# Patient Record
Sex: Male | Born: 1950
Health system: Southern US, Community
[De-identification: ages and names within clinical notes are randomized; demographics above are authoritative.]

## PROBLEM LIST (undated history)

## (undated) DIAGNOSIS — Z972 Presence of dental prosthetic device (complete) (partial): Secondary | ICD-10-CM

## (undated) DIAGNOSIS — M2140 Flat foot [pes planus] (acquired), unspecified foot: Secondary | ICD-10-CM

## (undated) DIAGNOSIS — M810 Age-related osteoporosis without current pathological fracture: Secondary | ICD-10-CM

## (undated) DIAGNOSIS — M1990 Unspecified osteoarthritis, unspecified site: Secondary | ICD-10-CM

## (undated) DIAGNOSIS — F1090 Alcohol use, unspecified, uncomplicated: Secondary | ICD-10-CM

## (undated) DIAGNOSIS — K1370 Unspecified lesions of oral mucosa: Secondary | ICD-10-CM

## (undated) DIAGNOSIS — F319 Bipolar disorder, unspecified: Secondary | ICD-10-CM

## (undated) DIAGNOSIS — Z7289 Other problems related to lifestyle: Secondary | ICD-10-CM

## (undated) DIAGNOSIS — N400 Enlarged prostate without lower urinary tract symptoms: Secondary | ICD-10-CM

## (undated) DIAGNOSIS — F259 Schizoaffective disorder, unspecified: Secondary | ICD-10-CM

## (undated) DIAGNOSIS — R7303 Prediabetes: Secondary | ICD-10-CM

## (undated) HISTORY — DX: Prediabetes: R73.03

## (undated) HISTORY — DX: Age-related osteoporosis without current pathological fracture: M81.0

## (undated) HISTORY — DX: Bipolar disorder, unspecified: F31.9

## (undated) HISTORY — DX: Unspecified osteoarthritis, unspecified site: M19.90

## (undated) HISTORY — DX: Benign prostatic hyperplasia without lower urinary tract symptoms: N40.0

## (undated) HISTORY — DX: Alcohol use, unspecified, uncomplicated: F10.90

## (undated) HISTORY — DX: Flat foot (pes planus) (acquired), unspecified foot: M21.40

## (undated) HISTORY — DX: Other problems related to lifestyle: Z72.89

## (undated) HISTORY — DX: Schizoaffective disorder, unspecified: F25.9

---
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
## 2002-07-25 ENCOUNTER — Encounter: Payer: Self-pay | Admitting: Family Medicine

## 2002-07-25 ENCOUNTER — Encounter: Admission: RE | Admit: 2002-07-25 | Discharge: 2002-07-25 | Payer: Self-pay | Admitting: Family Medicine

## 2002-08-26 ENCOUNTER — Encounter: Payer: Self-pay | Admitting: Family Medicine

## 2002-08-26 ENCOUNTER — Encounter: Admission: RE | Admit: 2002-08-26 | Discharge: 2002-08-26 | Payer: Self-pay | Admitting: Family Medicine

## 2004-08-03 ENCOUNTER — Ambulatory Visit: Payer: Self-pay | Admitting: Family Medicine

## 2005-03-03 ENCOUNTER — Ambulatory Visit: Payer: Self-pay | Admitting: Family Medicine

## 2005-03-18 ENCOUNTER — Ambulatory Visit: Payer: Self-pay | Admitting: Family Medicine

## 2005-04-28 ENCOUNTER — Ambulatory Visit: Payer: Self-pay | Admitting: Family Medicine

## 2005-10-04 ENCOUNTER — Ambulatory Visit: Payer: Self-pay | Admitting: Family Medicine

## 2005-11-04 ENCOUNTER — Ambulatory Visit: Payer: Self-pay | Admitting: Family Medicine

## 2006-10-27 ENCOUNTER — Ambulatory Visit: Payer: Self-pay | Admitting: Family Medicine

## 2006-10-27 LAB — CONVERTED CEMR LAB
PSA: 0.37 ng/mL
PSA: 0.37 ng/mL (ref 0.10–4.00)
Valproic Acid Lvl: 52 ug/mL (ref 50.0–100.0)

## 2006-11-04 ENCOUNTER — Emergency Department (HOSPITAL_COMMUNITY): Admission: EM | Admit: 2006-11-04 | Discharge: 2006-11-04 | Payer: Self-pay | Admitting: Family Medicine

## 2006-11-09 ENCOUNTER — Ambulatory Visit: Payer: Self-pay | Admitting: Family Medicine

## 2006-11-17 ENCOUNTER — Ambulatory Visit: Payer: Self-pay | Admitting: Family Medicine

## 2007-07-18 ENCOUNTER — Ambulatory Visit: Payer: Self-pay | Admitting: Family Medicine

## 2007-07-18 DIAGNOSIS — M79662 Pain in left lower leg: Secondary | ICD-10-CM

## 2007-07-18 DIAGNOSIS — R319 Hematuria, unspecified: Secondary | ICD-10-CM

## 2007-07-18 DIAGNOSIS — M7989 Other specified soft tissue disorders: Secondary | ICD-10-CM

## 2007-07-18 DIAGNOSIS — N400 Enlarged prostate without lower urinary tract symptoms: Secondary | ICD-10-CM | POA: Insufficient documentation

## 2007-07-18 DIAGNOSIS — F200 Paranoid schizophrenia: Secondary | ICD-10-CM

## 2007-07-18 HISTORY — DX: Benign prostatic hyperplasia without lower urinary tract symptoms: N40.0

## 2007-07-19 ENCOUNTER — Encounter: Admission: RE | Admit: 2007-07-19 | Discharge: 2007-07-19 | Payer: Self-pay | Admitting: Family Medicine

## 2007-08-17 ENCOUNTER — Encounter: Payer: Self-pay | Admitting: Family Medicine

## 2007-08-17 ENCOUNTER — Ambulatory Visit: Payer: Self-pay | Admitting: Internal Medicine

## 2007-08-27 ENCOUNTER — Encounter (INDEPENDENT_AMBULATORY_CARE_PROVIDER_SITE_OTHER): Payer: Self-pay | Admitting: *Deleted

## 2007-09-04 ENCOUNTER — Ambulatory Visit: Payer: Self-pay | Admitting: Family Medicine

## 2007-09-04 DIAGNOSIS — M810 Age-related osteoporosis without current pathological fracture: Secondary | ICD-10-CM

## 2007-09-04 DIAGNOSIS — Z87891 Personal history of nicotine dependence: Secondary | ICD-10-CM

## 2007-09-04 HISTORY — DX: Age-related osteoporosis without current pathological fracture: M81.0

## 2007-12-14 ENCOUNTER — Encounter (INDEPENDENT_AMBULATORY_CARE_PROVIDER_SITE_OTHER): Payer: Self-pay | Admitting: *Deleted

## 2007-12-17 ENCOUNTER — Ambulatory Visit: Payer: Self-pay | Admitting: Family Medicine

## 2007-12-17 DIAGNOSIS — J069 Acute upper respiratory infection, unspecified: Secondary | ICD-10-CM | POA: Insufficient documentation

## 2007-12-18 ENCOUNTER — Telehealth: Payer: Self-pay | Admitting: Family Medicine

## 2008-01-09 ENCOUNTER — Telehealth: Payer: Self-pay | Admitting: Family Medicine

## 2008-01-11 ENCOUNTER — Ambulatory Visit: Payer: Self-pay | Admitting: Family Medicine

## 2008-01-11 DIAGNOSIS — M2140 Flat foot [pes planus] (acquired), unspecified foot: Secondary | ICD-10-CM

## 2008-01-18 ENCOUNTER — Encounter: Payer: Self-pay | Admitting: Family Medicine

## 2008-07-09 ENCOUNTER — Ambulatory Visit: Payer: Self-pay | Admitting: Family Medicine

## 2008-07-09 DIAGNOSIS — T887XXA Unspecified adverse effect of drug or medicament, initial encounter: Secondary | ICD-10-CM | POA: Insufficient documentation

## 2008-07-10 ENCOUNTER — Telehealth: Payer: Self-pay | Admitting: Family Medicine

## 2008-07-11 LAB — CONVERTED CEMR LAB
ALT: 18 units/L (ref 0–53)
AST: 13 units/L (ref 0–37)
Albumin: 3.8 g/dL (ref 3.5–5.2)
Alkaline Phosphatase: 46 units/L (ref 39–117)
BUN: 7 mg/dL (ref 6–23)
Bilirubin, Direct: 0.1 mg/dL (ref 0.0–0.3)
CO2: 29 meq/L (ref 19–32)
Calcium: 9.5 mg/dL (ref 8.4–10.5)
Chloride: 100 meq/L (ref 96–112)
Creatinine, Ser: 0.8 mg/dL (ref 0.4–1.5)
GFR calc Af Amer: 129 mL/min
GFR calc non Af Amer: 106 mL/min
Glucose, Bld: 90 mg/dL (ref 70–99)
PSA: 0.41 ng/mL (ref 0.10–4.00)
Phosphorus: 3.5 mg/dL (ref 2.3–4.6)
Potassium: 4.3 meq/L (ref 3.5–5.1)
Sodium: 136 meq/L (ref 135–145)
TSH: 2.09 microintl units/mL (ref 0.35–5.50)
Total Bilirubin: 0.9 mg/dL (ref 0.3–1.2)
Total Protein: 6.7 g/dL (ref 6.0–8.3)
Vit D, 1,25-Dihydroxy: 49 (ref 30–89)

## 2008-11-13 ENCOUNTER — Ambulatory Visit: Payer: Self-pay | Admitting: Family Medicine

## 2008-11-13 DIAGNOSIS — R0609 Other forms of dyspnea: Secondary | ICD-10-CM

## 2008-11-13 DIAGNOSIS — R0989 Other specified symptoms and signs involving the circulatory and respiratory systems: Secondary | ICD-10-CM

## 2008-11-25 ENCOUNTER — Ambulatory Visit: Payer: Self-pay

## 2008-11-25 ENCOUNTER — Encounter: Payer: Self-pay | Admitting: Family Medicine

## 2012-09-26 DIAGNOSIS — R7303 Prediabetes: Secondary | ICD-10-CM

## 2012-09-26 HISTORY — DX: Prediabetes: R73.03

## 2012-11-20 LAB — CBC
HGB: 15.9 g/dL
WBC: 6.7
platelet count: 205

## 2012-11-20 LAB — HEMOGLOBIN A1C: A1c: 5.9

## 2012-11-20 LAB — VALPROIC ACID LEVEL: Valproic Acid Lvl: 54.5

## 2012-11-20 LAB — COMPREHENSIVE METABOLIC PANEL
ALT: 17 U/L (ref 10–40)
Glucose: 89

## 2012-11-20 LAB — LIPID PANEL
Direct LDL: 89
HDL: 77 mg/dL — AB (ref 35–70)

## 2012-12-28 ENCOUNTER — Emergency Department (HOSPITAL_COMMUNITY)
Admission: EM | Admit: 2012-12-28 | Discharge: 2012-12-28 | Disposition: A | Discharge status: Home / Self Care | Payer: BC Managed Care – PPO | Source: Home / Self Care | Attending: Emergency Medicine | Admitting: Emergency Medicine

## 2012-12-28 ENCOUNTER — Encounter (HOSPITAL_COMMUNITY): Payer: Self-pay | Admitting: *Deleted

## 2012-12-28 DIAGNOSIS — S139XXA Sprain of joints and ligaments of unspecified parts of neck, initial encounter: Secondary | ICD-10-CM

## 2012-12-28 MED ORDER — MELOXICAM 7.5 MG PO TABS: 7.5000 mg | ORAL_TABLET | Freq: Every day | ORAL | Status: DC

## 2012-12-28 NOTE — ED Notes (Signed)
Pt  Reports  Symptoms  Of a  Headache   With  Pain   Down  Back  Of  Neck       -   Pt  Reports  The  Symptoms   For  About 1  Week  He  Reports  The symptoms  Are   Worse  At  Night          - he  Reports  The  Pain is  Actually  Somewhat better  At  This  Time

## 2012-12-28 NOTE — ED Provider Notes (Signed)
History     CSN: [id number]  Arrival date & time 12/28/12  1004   First MD Initiated Contact with Patient 12/28/12 1022      Chief Complaint  Patient presents with  . Headache    (Consider location/radiation/quality/duration/timing/severity/associated sxs/prior treatment) HPI Comments: Patient presents urgent care describing pain and discomfort in the back of his head and neck area. It has been bothering him for about a week. 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 Pain is kind of dull sharp at times ex exacerbated with movements and activity denies any recent injuries or falls. Patient has been taking some Aleve with some partial or relieve. Patient denies any associated symptoms such as a global headache, paresthesias, weakness of upper lower extremities, no fevers, no neck rigidity, no gait changes or disturbance,  Patient goes into describing that he's asking for some vacation time as he's been feeling very stressed lately.  Patient is a 62 y.o. male presenting with headaches. The history is provided by the patient.  Headache Pain location:  Occipital Quality:  Sharp Radiates to:  Does not radiate Severity currently:  3/10 Severity at highest:  5/10 Onset quality:  Gradual Timing:  Sporadic Chronicity:  New Similar to prior headaches: no   Context: activity and emotional stress   Context: not coughing, not defecating, not exposure to cold air and not loud noise   Relieved by:  NSAIDs Worsened by:  Activity Associated symptoms: neck pain   Associated symptoms: no back pain, no blurred vision, no dizziness, no facial pain, no fatigue, no fever, no focal weakness, no hearing loss, no neck stiffness, no numbness, no photophobia, no seizures, no syncope, no tingling, no visual change and no weakness   Risk factors: insomnia     Past Medical History  Diagnosis Date  . Schizophrenia, paranoid     History  reviewed. No pertinent past surgical history.  No family history on file.  History  Substance Use Topics  . Smoking status: Never Smoker   . Smokeless tobacco: Not on file  . Alcohol Use: Yes      Review of Systems  Constitutional: Negative for fever, chills, diaphoresis, appetite change and fatigue.  HENT: Positive for neck pain. Negative for hearing loss, facial swelling and neck stiffness.   Eyes: Negative for blurred vision and photophobia.  Respiratory: Negative for shortness of breath.   Cardiovascular: Negative for chest pain and syncope.  Musculoskeletal: Negative for back pain, joint swelling and arthralgias.  Skin: Negative for rash.  Neurological: Positive for headaches. Negative for dizziness, tremors, focal weakness, seizures, syncope, facial asymmetry, weakness and numbness.    Allergies  Review of patient's allergies indicates no known allergies.  Home Medications   Current Outpatient Rx  Name  Route  Sig  Dispense  Refill  . ARIPiprazole (ABILIFY PO)   Oral   Take by mouth.         . benztropine (COGENTIN) 1 MG tablet   Oral   Take 1 mg by mouth daily.         . Divalproex Sodium (DEPAKOTE PO)   Oral   Take by mouth.         Marland Kitchen PARoxetine HCl (PAXIL PO)   Oral   Take by mouth.         . meloxicam (MOBIC) 7.5 MG tablet   Oral   Take 1 tablet (7.5 mg total) by mouth daily.  14 tablet   0     BP 112/87  Pulse 60  Temp(Src) 99.7 F (37.6 C) (Oral)  Resp 20  SpO2 99%  Physical Exam  Nursing note and vitals reviewed. Constitutional: He is oriented to person, place, and time. He appears well-developed and well-nourished. No distress.  HENT:  Head: Atraumatic.  Eyes: Conjunctivae are normal. Pupils are equal, round, and reactive to light.  Neck: Normal range of motion. Neck supple. No JVD present. No thyromegaly present.    Cardiovascular: Normal rate.  Exam reveals no gallop and no friction rub.   No murmur  heard. Pulmonary/Chest: Effort normal and breath sounds normal.  Musculoskeletal: He exhibits tenderness.  Lymphadenopathy:    He has no cervical adenopathy.  Neurological: He is alert and oriented to person, place, and time. He displays no atrophy and no tremor. No cranial nerve deficit or sensory deficit. He exhibits normal muscle tone. He displays a negative Romberg sign. Coordination normal. GCS eye subscore is 4. GCS verbal subscore is 5. GCS motor subscore is 6.  Skin: No erythema.    ED Course  Procedures (including critical care time)  Labs Reviewed - No data to display No results found.   1. Acute cervical sprain, initial encounter       MDM  Mr. Mano physical exam was most consistent with muscular tension related pain rather than a neurological syndrome. Patient will be prescribed a course of meloxicam was instructed to use for 7 days. We discussed what symptoms should warrant further evaluation in the emergency department. Have advised him that if no improvement after 7-10 days to followup with primary care Dr. for further evaluation and management. Patient agrees with current treatment plan diagnostic impression and follow-up as needed.        Jimmie Molly, MD 12/28/12 [id number]

## 2013-01-02 ENCOUNTER — Emergency Department (HOSPITAL_COMMUNITY)
Admission: EM | Admit: 2013-01-02 | Discharge: 2013-01-02 | Disposition: A | Discharge status: Home / Self Care | Payer: BC Managed Care – PPO | Attending: Emergency Medicine | Admitting: Emergency Medicine

## 2013-01-02 ENCOUNTER — Encounter (HOSPITAL_COMMUNITY): Payer: Self-pay | Admitting: Family Medicine

## 2013-01-02 ENCOUNTER — Emergency Department (HOSPITAL_COMMUNITY): Payer: BC Managed Care – PPO

## 2013-01-02 ENCOUNTER — Telehealth: Payer: Self-pay | Admitting: Family Medicine

## 2013-01-02 DIAGNOSIS — Z79899 Other long term (current) drug therapy: Secondary | ICD-10-CM | POA: Insufficient documentation

## 2013-01-02 DIAGNOSIS — M47812 Spondylosis without myelopathy or radiculopathy, cervical region: Secondary | ICD-10-CM

## 2013-01-02 DIAGNOSIS — F200 Paranoid schizophrenia: Secondary | ICD-10-CM | POA: Insufficient documentation

## 2013-01-02 MED ORDER — IBUPROFEN 800 MG PO TABS: 800.0000 mg | ORAL_TABLET | Freq: Three times a day (TID) | ORAL | Status: DC

## 2013-01-02 NOTE — ED Provider Notes (Signed)
History     CSN: [id number]  Arrival date & time 01/02/13  0917   None     Chief Complaint  Patient presents with  . Neck Pain    (Consider location/radiation/quality/duration/timing/severity/associated sxs/prior treatment) HPI Comments: 62 y.o. Male presents with cervical neck pain that he says started about a week ago. Pt states pain is severe and keeps him from sleeping at night. Pain is worse with movement. Pt states he was seen at [facility] five days ago and was given Mobic which does not help. Pt is here with his mother who would like her son to have an xray.  Denies any recent injuries or falls, headache, paresthesias, weakness of upper lower extremities, no fevers, no neck rigidity, no gait changes or disturbance.  Patient has been taking Aleve which provides some relief.     Patient is a 62 y.o. male presenting with neck pain.  Neck Pain Associated symptoms: no chest pain, no fever, no headaches, no numbness and no weakness     Past Medical History  Diagnosis Date  . Schizophrenia, paranoid     History reviewed. No pertinent past surgical history.  History reviewed. No pertinent family history.  History  Substance Use Topics  . Smoking status: Never Smoker   . Smokeless tobacco: Not on file  . Alcohol Use: Yes      Review of Systems  Constitutional: Negative for fever and diaphoresis.  HENT: Positive for neck pain. Negative for neck stiffness.        Posterior cervical pain  Eyes: Negative for visual disturbance.  Respiratory: Negative for apnea, chest tightness and shortness of breath.   Cardiovascular: Negative for chest pain and palpitations.  Gastrointestinal: Negative for nausea, vomiting, diarrhea and constipation.  Genitourinary: Negative for dysuria.  Musculoskeletal: Negative for gait problem.  Skin: Negative for rash.  Neurological: Negative for dizziness, weakness, light-headedness, numbness and headaches.    Allergies  Review of patient's  allergies indicates no known allergies.  Home Medications   Current Outpatient Rx  Name  Route  Sig  Dispense  Refill  . acetaminophen (TYLENOL) 500 MG tablet   Oral   Take 1,000 mg by mouth every 6 (six) hours as needed for pain (pain).         . ARIPiprazole (ABILIFY) 5 MG tablet   Oral   Take 5 mg by mouth 2 (two) times daily with breakfast and lunch.         . benztropine (COGENTIN) 1 MG tablet   Oral   Take 1 mg by mouth at bedtime.          Marland Kitchen CALCIUM CITRATE PO   Oral   Take 2 tablets by mouth daily after breakfast.         . divalproex (DEPAKOTE ER) 500 MG 24 hr tablet   Oral   Take 1,000 mg by mouth at bedtime.          Marland Kitchen FLUoxetine (PROZAC) 20 MG capsule   Oral   Take 20 mg by mouth daily after breakfast.         . meloxicam (MOBIC) 7.5 MG tablet   Oral   Take 7.5 mg by mouth at bedtime.         . Menthol-Methyl Salicylate (MUSCLE RUB) 10-15 % CREA   Topical   Apply 1 application topically daily as needed (muscle pain.).         Marland Kitchen Multiple Vitamin (MULTIVITAMIN WITH MINERALS) TABS   Oral  Take 1 tablet by mouth daily after breakfast.           BP 152/87  Pulse 71  Temp(Src) 97.7 F (36.5 C) (Oral)  Resp 18  SpO2 98%  Physical Exam  Nursing note and vitals reviewed. Constitutional: He is oriented to person, place, and time. He appears well-developed and well-nourished. No distress.  HENT:  Head: Normocephalic and atraumatic.  Eyes: Conjunctivae and EOM are normal.  Neck: Normal range of motion. Neck supple.  No meningeal signs  Cardiovascular: Normal rate, regular rhythm and normal heart sounds.  Exam reveals no gallop and no friction rub.   No murmur heard. Pulmonary/Chest: Effort normal and breath sounds normal. No respiratory distress. He has no wheezes. He has no rales. He exhibits no tenderness.  Abdominal: Soft. Bowel sounds are normal. He exhibits no distension. There is no tenderness. There is no rebound and no guarding.   Musculoskeletal: Normal range of motion. He exhibits no edema and no tenderness.  No step-offs noted on C-spine, full range of motion of c spine, though uncomfortable. Mild tenderness to palpation of the spinous processes of the C-spin Mild tenderness to palpation of the paraspinous muscles of cervical spine and trapezius  Neurological: He is alert and oriented to person, place, and time. No cranial nerve deficit.  Speech is clear and goal oriented, follows commandsSensation normal to light and sharp touch   Skin: Skin is warm and dry. He is not diaphoretic. No erythema.    ED Course  Procedures (including critical care time)  Labs Reviewed - No data to display Dg Neck Soft Tissue  01/02/2013  *RADIOLOGY REPORT*  Clinical Data: Base of skull pain to left shoulder.  NECK SOFT TISSUES - 1+ VIEW  Comparison: None.  Findings: Prevertebral soft tissues are within normal limits. Epiglottic and aryepiglottic fold shadows are sharp.  Degenerative changes are incidentally imaged in the mid and lower cervical spine.  IMPRESSION:  1.  Mid and lower cervical spondylosis. 2.  No acute findings.   Original Report Authenticated By: Leanna Battles, M.D.    Discharge Medication List as of 01/02/2013 12:19 PM    START taking these medications   Details  ibuprofen (ADVIL,MOTRIN) 800 MG tablet Take 1 tablet (800 mg total) by mouth 3 (three) times daily., Starting 01/02/2013, Until Discontinued, Print         1. Spondylosis, cervical       MDM  Pt seen at [facility] 12/29/12 for same but wasn't sure what he was supposed to do and wanted an xray this time.  Worried it was something acute. Xray showed no acute findings. Cervical spondylosis. Pt has appointment with Dr. Sharen Hones June 6. Emphasized the importance of follow up as treatment could include many different types of conservative measure such as NSAIDs, lifestyle modifications, and physical therapy. Pt understood and was in agreement with discharge plan.    Glade Nurse, PA-C 01/02/13 2216

## 2013-01-02 NOTE — Telephone Encounter (Signed)
Pt is a previously established patient. May place in any open slot.

## 2013-01-02 NOTE — Telephone Encounter (Signed)
Patient has an appointment with you to get re-established on 03/01/13.  Patient's mother is calling and she said patient went to [facility] ER today.  Patient is having neck pain.  ER told him he has arthritis.  Patient is unable to sleep due to the pain.  Can you see patient sooner than June?

## 2013-01-02 NOTE — ED Notes (Signed)
Per pt neck pain that started about 1 week ago and worse at night when sleeping. Denies injury. sts was seen at [facility] and given Mobic without relief. sts hurts to move neck.

## 2013-01-02 NOTE — ED Provider Notes (Signed)
Medical screening examination/treatment/procedure(s) were performed by non-physician practitioner and as supervising physician I was immediately available for consultation/collaboration.    Vida Roller, MD 01/02/13 2218

## 2013-01-02 NOTE — ED Notes (Signed)
Pt reports neck pain 4/10 since 12/27/12.  Was seen om 12/28/12 at [facility].  Pt states he was given mobic without relief.  Pt has not seen orthopedic but he wants to get an xray.  Pt denies injury.  States pain increasing when lying down.  Pt alert oriented X4

## 2013-01-03 ENCOUNTER — Ambulatory Visit (INDEPENDENT_AMBULATORY_CARE_PROVIDER_SITE_OTHER): Payer: BC Managed Care – PPO | Admitting: Family Medicine

## 2013-01-03 ENCOUNTER — Encounter: Payer: Self-pay | Admitting: Family Medicine

## 2013-01-03 VITALS — BP 130/96 | HR 72 | Temp 98.0°F | Ht 72.0 in | Wt 181.0 lb

## 2013-01-03 DIAGNOSIS — S139XXA Sprain of joints and ligaments of unspecified parts of neck, initial encounter: Secondary | ICD-10-CM

## 2013-01-03 DIAGNOSIS — Z87891 Personal history of nicotine dependence: Secondary | ICD-10-CM

## 2013-01-03 DIAGNOSIS — S161XXA Strain of muscle, fascia and tendon at neck level, initial encounter: Secondary | ICD-10-CM

## 2013-01-03 DIAGNOSIS — F200 Paranoid schizophrenia: Secondary | ICD-10-CM

## 2013-01-03 DIAGNOSIS — M810 Age-related osteoporosis without current pathological fracture: Secondary | ICD-10-CM

## 2013-01-03 MED ORDER — METHOCARBAMOL 500 MG PO TABS: 500.0000 mg | ORAL_TABLET | Freq: Three times a day (TID) | ORAL | Status: DC

## 2013-01-03 NOTE — Assessment & Plan Note (Signed)
Records reviewed.  Mild cervical spondylosis, however given exam anticipate more L trapezius strain. Treat with ibuprofen/tylenol, and robaxin, ice/heat, and stretching exercises from [facility] pt advisor. If not better with this, will call us for referral for PT. Pt/mother agree with plan.

## 2013-01-03 NOTE — Assessment & Plan Note (Signed)
followed by [employer]

## 2013-01-03 NOTE — Progress Notes (Signed)
Subjective:    Patient ID: Robert Fowler, male    DOB: [birth year], 62 y.o.   MRN: [id number]  HPI CC: re establish, neck pain  Presents with mother.  62 yo with h/o paranoid schizophrenia last seen here 10/2008 who presents today to re establish care, as well as for acute visit for evaluation of neck pain.  He has been evaluated x2 in last week, once at [facility], once at ER.  Initially thought MSK cervical strain, treated with meloxicam.  Then returned for further evaluation at ER with xray, dx as cervical spndylosis and treated with ibuprofen 800mg .  Records reviewed.  Describes 1 month h/o bilateral neck pain that starts in posterior neck and occasionally shoots down to chin and down legs.  Trouble sleeping 2/2 pain.  Currently 1/10 pain.  At its worse 10/10 pain.  Occasional shooting pain down L>R arm, paresthesias but no numbness.  Denies weakness of hands.  Denies inciting trauma/injury.  H/o pes planus.  Dg Neck Soft Tissue  01/02/2013 *RADIOLOGY REPORT* Clinical Data: Base of skull pain to left shoulder. NECK SOFT TISSUES - 1+ VIEW Comparison: None. Findings: Prevertebral soft tissues are within normal limits. Epiglottic and aryepiglottic fold shadows are sharp. Degenerative changes are incidentally imaged in the mid and lower cervical spine. IMPRESSION: 1. Mid and lower cervical spondylosis. 2. No acute findings. Original Report Authenticated By: Leanna Battles, M.D.   H/o paranoid schizophrenia.  Sees Denton Brick NP at [facility].  Past Medical History  Diagnosis Date  . Schizophrenia, paranoid   . Depression      Review of Systems  Constitutional: Negative for fever, chills, activity change, appetite change, fatigue and unexpected weight change.  HENT: Negative for hearing loss and neck pain.   Eyes: Negative for visual disturbance.  Respiratory: Negative for cough, chest tightness, shortness of breath and wheezing.   Cardiovascular: Negative for chest pain, palpitations and leg swelling.   Gastrointestinal: Positive for nausea, diarrhea and constipation. Negative for vomiting, abdominal pain, blood in stool and abdominal distention.  Genitourinary: Negative for hematuria and difficulty urinating.  Musculoskeletal: Negative for myalgias and arthralgias.  Skin: Negative for rash.  Neurological: Positive for headaches. Negative for dizziness, seizures and syncope.  Hematological: Does not bruise/bleed easily.  Psychiatric/Behavioral: Positive for dysphoric mood. The patient is not nervous/anxious.        Objective:   Physical Exam  Nursing note and vitals reviewed. Constitutional: He is oriented to person, place, and time. He appears well-developed and well-nourished. No distress.  HENT:  Head: Normocephalic and atraumatic.  Right Ear: Hearing, tympanic membrane, external ear and ear canal normal.  Left Ear: Hearing, tympanic membrane, external ear and ear canal normal.  Nose: Nose normal.  Mouth/Throat: Oropharynx is clear and moist. No oropharyngeal exudate.  Eyes: Conjunctivae and EOM are normal. Pupils are equal, round, and reactive to light. No scleral icterus.  Neck: Normal range of motion. Neck supple. No thyromegaly present.  Cardiovascular: Normal rate, regular rhythm, normal heart sounds and intact distal pulses.   No murmur heard. Pulses:      Radial pulses are 2+ on the right side, and 2+ on the left side.  Pulmonary/Chest: Effort normal and breath sounds normal. No respiratory distress. He has no wheezes. He has no rales.  Abdominal: Soft. Bowel sounds are normal. He exhibits no distension and no mass. There is no tenderness. There is no rebound and no guarding.  Musculoskeletal: Normal range of motion. He exhibits no edema.  Mild thoracic scoliosis No  midline spine tenderness.   ++ L trapezius belly tightness/tender to palpation noted. FROM at neck and at shoulders without pain.  Lymphadenopathy:    He has no cervical adenopathy.  Neurological: He is  alert and oriented to person, place, and time.  CN grossly intact, station and gait intact 5/5 strength BUE, grip strength intact bilaterally 2+ DTRs biceps   Skin: Skin is warm and dry. No rash noted.  Psychiatric: He has a normal mood and affect. His behavior is normal. Judgment and thought content normal.       Assessment & Plan:

## 2013-01-03 NOTE — Telephone Encounter (Signed)
Coming in today

## 2013-01-03 NOTE — Patient Instructions (Addendum)
Let's keep an eye on blood pressure at local pharmacy - if consistently >140/90, let me know. You do have trapezius muscle tightness on left.  I think this is where pain is coming from. May use ibuprofen 800mg  alternating with tylenol 650mg  (up to 3 times a day). Start robaxin as muscle relaxant - caution it can make you sleepy. Ice/heat to neck. Start stretching exercises provided today. If not better with this, call me for referral to physical therapy. Good to see you today, call us with questions.

## 2013-01-03 NOTE — Assessment & Plan Note (Signed)
Discussed recommended calcium intake. No records of dexa, consider in future. rec continue calcium supplements.

## 2013-01-04 ENCOUNTER — Encounter: Payer: Self-pay | Admitting: Family Medicine

## 2013-01-29 ENCOUNTER — Telehealth: Payer: Self-pay

## 2013-01-29 DIAGNOSIS — R7303 Prediabetes: Secondary | ICD-10-CM

## 2013-01-29 NOTE — Telephone Encounter (Signed)
Pt left lab results dated 11/20/2012 for Dr Sharen Hones to review. Labs are in Dr Gutierrez's in box.

## 2013-02-01 ENCOUNTER — Encounter: Payer: Self-pay | Admitting: *Deleted

## 2013-02-01 DIAGNOSIS — R7303 Prediabetes: Secondary | ICD-10-CM | POA: Insufficient documentation

## 2013-02-01 NOTE — Telephone Encounter (Signed)
Letter mailed notifying patient. 

## 2013-02-01 NOTE — Telephone Encounter (Signed)
plz notify I reviewed records - blood count normal, liver, kidneys, sugar normal, chol normal,thyroid normal. A1c returned slightly elevated - in prediabetes range which increases his risk of developing diabetes - watch added sugars and avoid simple carbs.

## 2013-02-13 ENCOUNTER — Telehealth: Payer: Self-pay

## 2013-02-13 NOTE — Telephone Encounter (Signed)
Pt request copy of labs he brought for Dr G to review; pt needs for health assessment for [employer] and pt forgot to make himself a copy. Copy of labs at front desk for pick up.

## 2013-03-01 ENCOUNTER — Ambulatory Visit: Payer: Self-pay | Admitting: Family Medicine

## 2013-03-12 ENCOUNTER — Encounter: Payer: Self-pay | Admitting: Family Medicine

## 2013-07-23 LAB — COMPREHENSIVE METABOLIC PANEL
ALT: 14 U/L (ref 10–40)
Creat: 0.75
Sodium: 134 mmol/L — AB (ref 137–147)
Total Bilirubin: 1 mg/dL

## 2013-07-23 LAB — VALPROIC ACID LEVEL, FREE: Valproic Acid, Free: 8.4

## 2013-07-23 LAB — LIPID PANEL: LDL (calc): 78

## 2013-08-28 ENCOUNTER — Ambulatory Visit (INDEPENDENT_AMBULATORY_CARE_PROVIDER_SITE_OTHER): Payer: BC Managed Care – PPO | Admitting: Family Medicine

## 2013-08-28 ENCOUNTER — Encounter: Payer: Self-pay | Admitting: Family Medicine

## 2013-08-28 VITALS — BP 126/84 | HR 72 | Temp 98.2°F | Wt 177.0 lb

## 2013-08-28 DIAGNOSIS — R7303 Prediabetes: Secondary | ICD-10-CM

## 2013-08-28 DIAGNOSIS — R7309 Other abnormal glucose: Secondary | ICD-10-CM

## 2013-08-28 DIAGNOSIS — M7061 Trochanteric bursitis, right hip: Secondary | ICD-10-CM

## 2013-08-28 DIAGNOSIS — F200 Paranoid schizophrenia: Secondary | ICD-10-CM

## 2013-08-28 DIAGNOSIS — M76899 Other specified enthesopathies of unspecified lower limb, excluding foot: Secondary | ICD-10-CM

## 2013-08-28 DIAGNOSIS — M810 Age-related osteoporosis without current pathological fracture: Secondary | ICD-10-CM

## 2013-08-28 NOTE — Assessment & Plan Note (Signed)
Pt states taking cal/vit D two tablets daily - may need dexa as no records in our chart. Asked him to bring me cal supplement he takes to verify.

## 2013-08-28 NOTE — Progress Notes (Signed)
Pre-visit discussion using our clinic review tool. No additional management support is needed unless otherwise documented below in the visit note.  

## 2013-08-28 NOTE — Progress Notes (Signed)
   Subjective:    Patient ID: Robert Fowler, male    DOB: [birth year], 62 y.o.   MRN: [id number]  HPI CC: discuss labs  Mr. Hardage presents alone today.  He has h/o paranoid schizophrenia, depression and is on abilify and depakote and cogentin.  He presents today to discuss recent abnormal labs done at psych [facility] NP) including Na 134 and A1c 5.7%, but glu 86.  All other labs were normal.  He has known history of prediabetes.  For his osteoporosis - he takes calcium 2 daily with vitamin D ([employer] brand) and a multivitamin daily. He has intermittent R hip and knee pain.  Points to GTB of hip. Also wonders about sleep apnea - endorses daytime somnolence.  Does think he snores, unsure about apneic episodes.  Sleeps on back.  Wakes up feeling rested.  Past Medical History  Diagnosis Date  . Schizophrenia, paranoid     schizoaffective disorder  . Depression   . OSTEOPOROSIS 09/04/2007  . BENIGN PROSTATIC HYPERTROPHY 07/18/2007    Qualifier: Diagnosis of  By: Milinda Antis MD, Colon Flattery   . Prediabetes 2014     Review of Systems Per HPI    Objective:   Physical Exam  Nursing note and vitals reviewed. Constitutional: He appears well-developed and well-nourished. No distress.  HENT:  Mouth/Throat: Oropharynx is clear and moist. No oropharyngeal exudate.  Cardiovascular: Normal rate, regular rhythm, normal heart sounds and intact distal pulses.   No murmur heard. Pulmonary/Chest: Effort normal and breath sounds normal. No respiratory distress. He has no wheezes. He has no rales.  Musculoskeletal: He exhibits no edema.  Tender at R GTB  Psychiatric: He has a normal mood and affect.  pleasant       Assessment & Plan:  I've asked Cline to return in next few months at his convenience for physical and preventative healthcare discussion

## 2013-08-28 NOTE — Assessment & Plan Note (Signed)
I encouraged him to do stretching exercises from [facility] pt advisor and use tylenol/ibuprofen for pain as needed. Update if not improving, consider bursal injection.

## 2013-08-28 NOTE — Assessment & Plan Note (Signed)
Stable.  Continue meds

## 2013-08-28 NOTE — Assessment & Plan Note (Signed)
Reviewed blood work - encouraged avoiding added sugars in diet.

## 2013-08-28 NOTE — Patient Instructions (Addendum)
Let's keep an eye on your sleep - if you notice worsening daytime sleepiness, let us know for further evaluation.  Try to get at least 7 hours of sleep. You do have prediabetes - I recommend trying to minimize sugar in diet I'd like you to return for a physical and blood work afterwards. Flu shot today.

## 2013-09-17 ENCOUNTER — Encounter: Payer: Self-pay | Admitting: *Deleted

## 2013-10-29 ENCOUNTER — Encounter: Payer: Self-pay | Admitting: Family Medicine

## 2013-10-29 ENCOUNTER — Ambulatory Visit (INDEPENDENT_AMBULATORY_CARE_PROVIDER_SITE_OTHER): Payer: BC Managed Care – PPO | Admitting: Family Medicine

## 2013-10-29 VITALS — BP 114/82 | HR 60 | Temp 97.3°F | Wt 175.5 lb

## 2013-10-29 DIAGNOSIS — F200 Paranoid schizophrenia: Secondary | ICD-10-CM

## 2013-10-29 DIAGNOSIS — M810 Age-related osteoporosis without current pathological fracture: Secondary | ICD-10-CM

## 2013-10-29 DIAGNOSIS — Z87891 Personal history of nicotine dependence: Secondary | ICD-10-CM

## 2013-10-29 DIAGNOSIS — Z23 Encounter for immunization: Secondary | ICD-10-CM

## 2013-10-29 NOTE — Assessment & Plan Note (Signed)
Check dexa scan. Continue cal/vit D 2 tablets daily

## 2013-10-29 NOTE — Addendum Note (Signed)
Addended by: Royann Shivers A on: 10/29/2013 09:58 AM   Modules accepted: Orders

## 2013-10-29 NOTE — Progress Notes (Signed)
Pre-visit discussion using our clinic review tool. No additional management support is needed unless otherwise documented below in the visit note.  

## 2013-10-29 NOTE — Assessment & Plan Note (Signed)
Remains abstinent 

## 2013-10-29 NOTE — Assessment & Plan Note (Signed)
Chronic, stable on current regimen.  Continue meds.  Followed by psych.

## 2013-10-29 NOTE — Patient Instructions (Addendum)
Tdap today. Return for physical in 2-3 months. Pass by Marion's office to schedule bone density scan. Good to see you today, call us with questions.

## 2013-10-29 NOTE — Progress Notes (Signed)
   Subjective:    Patient ID: Robert Fowler, male    DOB: 09-03-1951, 63 y.o.   MRN: [id number]  HPI CC: 1 mo f/u  [name] presents alone today. He has h/o paranoid schizophrenia, depression and is on abilify and depakote and cogentin.    Notes some frustration at work.  This has improved with abilify.  Osteoporosis - no recent dexa.  Will order dexa today.  No smoking.  On long term depakote, abilify, and cogentin as well as prozac.  Past Medical History  Diagnosis Date  . Schizophrenia, paranoid     schizoaffective disorder  . Depression   . OSTEOPOROSIS 09/04/2007  . BENIGN PROSTATIC HYPERTROPHY 07/18/2007    Qualifier: Diagnosis of  By: Glori Bickers MD, Carmell Austria   . Prediabetes 2014     Review of Systems Per HPI    Objective:   Physical Exam  Nursing note and vitals reviewed. Constitutional: He appears well-developed and well-nourished. No distress.  HENT:  Mouth/Throat: Oropharynx is clear and moist. No oropharyngeal exudate.  Cardiovascular: Normal rate, regular rhythm, normal heart sounds and intact distal pulses.   No murmur heard. Pulmonary/Chest: Effort normal and breath sounds normal. No respiratory distress. He has no wheezes. He has no rales.  Musculoskeletal: He exhibits no edema.  Psychiatric: He has a normal mood and affect.       Assessment & Plan:

## 2013-11-01 ENCOUNTER — Encounter: Payer: Self-pay | Admitting: *Deleted

## 2013-11-01 ENCOUNTER — Telehealth: Payer: Self-pay | Admitting: *Deleted

## 2013-11-01 NOTE — Telephone Encounter (Signed)
Per Robert Fowler, patient does not want to schedule DEXA at this time due to financial concerns. She has a note put on the referral but asks that it be cancelled to remove it from the referral que.

## 2013-11-01 NOTE — Telephone Encounter (Signed)
Noted.  Will cancel referral.  

## 2014-01-19 ENCOUNTER — Other Ambulatory Visit: Payer: Self-pay | Admitting: Family Medicine

## 2014-01-19 DIAGNOSIS — N400 Enlarged prostate without lower urinary tract symptoms: Secondary | ICD-10-CM

## 2014-01-19 DIAGNOSIS — F200 Paranoid schizophrenia: Secondary | ICD-10-CM

## 2014-01-19 DIAGNOSIS — M810 Age-related osteoporosis without current pathological fracture: Secondary | ICD-10-CM

## 2014-01-19 DIAGNOSIS — R7303 Prediabetes: Secondary | ICD-10-CM

## 2014-01-20 ENCOUNTER — Other Ambulatory Visit (INDEPENDENT_AMBULATORY_CARE_PROVIDER_SITE_OTHER): Payer: BC Managed Care – PPO

## 2014-01-20 DIAGNOSIS — R7309 Other abnormal glucose: Secondary | ICD-10-CM

## 2014-01-20 DIAGNOSIS — F200 Paranoid schizophrenia: Secondary | ICD-10-CM

## 2014-01-20 DIAGNOSIS — N400 Enlarged prostate without lower urinary tract symptoms: Secondary | ICD-10-CM

## 2014-01-20 DIAGNOSIS — R7303 Prediabetes: Secondary | ICD-10-CM

## 2014-01-20 DIAGNOSIS — M810 Age-related osteoporosis without current pathological fracture: Secondary | ICD-10-CM

## 2014-01-20 LAB — COMPREHENSIVE METABOLIC PANEL
ALBUMIN: 4.1 g/dL (ref 3.5–5.2)
ALK PHOS: 54 U/L (ref 39–117)
ALT: 17 U/L (ref 0–53)
AST: 11 U/L (ref 0–37)
BUN: 11 mg/dL (ref 6–23)
CO2: 29 meq/L (ref 19–32)
Calcium: 9.7 mg/dL (ref 8.4–10.5)
Chloride: 98 mEq/L (ref 96–112)
Creatinine, Ser: 0.7 mg/dL (ref 0.4–1.5)
GFR: 113.95 mL/min (ref >60.00)
GLUCOSE: 90 mg/dL (ref 70–99)
POTASSIUM: 4.8 meq/L (ref 3.5–5.1)
SODIUM: 136 meq/L (ref 135–145)
TOTAL PROTEIN: 6.9 g/dL (ref 6.0–8.3)
Total Bilirubin: 1.2 mg/dL (ref 0.3–1.2)

## 2014-01-20 LAB — CBC WITH DIFFERENTIAL/PLATELET
BASOS PCT: 0.8 % (ref 0.0–3.0)
Basophils Absolute: 0 10*3/uL (ref 0.0–0.1)
EOS PCT: 3.3 % (ref 0.0–5.0)
Eosinophils Absolute: 0.2 10*3/uL (ref 0.0–0.7)
HCT: 45.7 % (ref 39.0–52.0)
Hemoglobin: 15.6 g/dL (ref 13.0–17.0)
LYMPHS PCT: 29.1 % (ref 12.0–46.0)
Lymphs Abs: 1.3 10*3/uL (ref 0.7–4.0)
MCHC: 34.1 g/dL (ref 30.0–36.0)
MCV: 90.3 fl (ref 78.0–100.0)
MONO ABS: 0.4 10*3/uL (ref 0.1–1.0)
MONOS PCT: 9.8 % (ref 3.0–12.0)
NEUTROS PCT: 57 % (ref 43.0–77.0)
Neutro Abs: 2.6 10*3/uL (ref 1.4–7.7)
PLATELETS: 195 10*3/uL (ref 150.0–400.0)
RBC: 5.05 Mil/uL (ref 4.22–5.81)
RDW: 14.3 % (ref 11.5–14.6)
WBC: 4.5 10*3/uL (ref 4.5–10.5)

## 2014-01-20 LAB — PSA: PSA: 1.76 ng/mL (ref 0.10–4.00)

## 2014-01-20 LAB — HEMOGLOBIN A1C: HEMOGLOBIN A1C: 5.3 % (ref 4.6–6.5)

## 2014-01-21 LAB — VITAMIN D 25 HYDROXY (VIT D DEFICIENCY, FRACTURES): VIT D 25 HYDROXY: 61 ng/mL (ref 30–89)

## 2014-01-21 LAB — VALPROIC ACID LEVEL: Valproic Acid Lvl: 73.9 ug/mL (ref 50.0–100.0)

## 2014-01-27 ENCOUNTER — Ambulatory Visit (INDEPENDENT_AMBULATORY_CARE_PROVIDER_SITE_OTHER): Payer: BC Managed Care – PPO | Admitting: Family Medicine

## 2014-01-27 ENCOUNTER — Encounter: Payer: Self-pay | Admitting: Family Medicine

## 2014-01-27 VITALS — BP 130/80 | HR 76 | Temp 98.1°F | Ht 72.0 in | Wt 177.5 lb

## 2014-01-27 DIAGNOSIS — Z1211 Encounter for screening for malignant neoplasm of colon: Secondary | ICD-10-CM

## 2014-01-27 DIAGNOSIS — F200 Paranoid schizophrenia: Secondary | ICD-10-CM

## 2014-01-27 DIAGNOSIS — M810 Age-related osteoporosis without current pathological fracture: Secondary | ICD-10-CM

## 2014-01-27 DIAGNOSIS — Z0000 Encounter for general adult medical examination without abnormal findings: Secondary | ICD-10-CM

## 2014-01-27 DIAGNOSIS — N400 Enlarged prostate without lower urinary tract symptoms: Secondary | ICD-10-CM

## 2014-01-27 NOTE — Assessment & Plan Note (Signed)
Preventative protocols reviewed and updated unless pt declined. Discussed healthy diet and lifestyle. iFOB today. DRE/PSA reassuring today.

## 2014-01-27 NOTE — Assessment & Plan Note (Addendum)
Stable.  H/o BPH per chart, but stable on exam today.

## 2014-01-27 NOTE — Progress Notes (Signed)
BP 130/80  Pulse 76  Temp(Src) 98.1 F (36.7 C) (Oral)  Ht 6' (1.829 m)  Wt 177 lb 8 oz (80.513 kg)  BMI 24.07 kg/m2   CC: CPE  Subjective:    Patient ID: Robert Fowler, male    DOB: 1951/02/14, 63 y.o.   MRN: [id number]  HPI: Robert Fowler is a 63 y.o. male presenting on 01/27/2014 for Annual Exam   [name] presents alone today for annual exam.   He has h/o paranoid schizophrenia, depression and is on abilify and prozac and depakote and cogentin. Followed by [facility].  Sees Pauline Good NP.  Working on decreasing alcohol together with [name].  Osteoporosis - did not undergo DEXA 2/2 financial concerns. Will call insurance company to ask about cost. Compliant with calcium/vit D3 600/400mg  two daily.  Also takes MVI.  Preventative: Unsure last CPE Colon cancer screening - discussed, would like iFOB Prostate cancer screening - discussed, would like to have this checked.  Nocturia x0-2.  H/o BPH per chart. Flu shot 06/2013 Tdap 10/2013 zostavax - will check with insurance Saw eye doctor 2 wks ago - has new glasses ([facility]). Seat belt and sunscreen use discussed  Lives alone Mother and brother live nearby. Occupation: Retail buyer at [employer] Edu: 12th grade Activity: mows yard Diet: good water, fruits/vegetables daily  Relevant past medical, surgical, family and social history reviewed and updated as indicated.  Allergies and medications reviewed and updated. Current Outpatient Prescriptions on File Prior to Visit  Medication Sig  . ARIPiprazole (ABILIFY) 20 MG tablet Take 20 mg by mouth daily.  . benztropine (COGENTIN) 1 MG tablet Take 1 mg by mouth at bedtime.   . Calcium Carb-Cholecalciferol (CALCIUM-VITAMIN D) 600-400 MG-UNIT TABS Take 2 tablets by mouth daily.  . divalproex (DEPAKOTE ER) 500 MG 24 hr tablet Take 1,000 mg by mouth at bedtime.   Marland Kitchen FLUoxetine (PROZAC) 20 MG capsule Take 20 mg by mouth daily after breakfast.  . Multiple Vitamin (MULTIVITAMIN WITH  MINERALS) TABS Take 1 tablet by mouth daily after breakfast.  . acetaminophen (TYLENOL) 500 MG tablet Take 1,000 mg by mouth every 6 (six) hours as needed for pain (pain).  . Menthol-Methyl Salicylate (MUSCLE RUB) 10-15 % CREA Apply 1 application topically daily as needed (muscle pain.).   No current facility-administered medications on file prior to visit.    Review of Systems  Constitutional: Negative for fever, chills, activity change, appetite change, fatigue and unexpected weight change.       Wt Readings from Last 3 Encounters: 01/27/14 : 177 lb 8 oz (80.513 kg) 10/29/13 : 175 lb 8 oz (79.606 kg) 08/28/13 : 177 lb (80.287 kg)  HENT: Negative for hearing loss.   Eyes: Negative for visual disturbance.  Respiratory: Positive for cough (rare) and shortness of breath (mild). Negative for chest tightness and wheezing.   Cardiovascular: Negative for chest pain, palpitations and leg swelling.  Gastrointestinal: Positive for diarrhea (occasional) and blood in stool (occasional improved with witch hazel, h/o hemorrhoids). Negative for nausea, vomiting, abdominal pain, constipation and abdominal distention.  Genitourinary: Negative for hematuria and difficulty urinating.  Musculoskeletal: Negative for arthralgias, myalgias and neck pain.       Flat footed without pain  Skin: Negative for rash.  Neurological: Negative for dizziness, seizures, syncope and headaches.  Hematological: Negative for adenopathy. Does not bruise/bleed easily.  Psychiatric/Behavioral: Positive for dysphoric mood (rare). The patient is not nervous/anxious.    Per HPI unless specifically indicated above  Objective:    BP 130/80  Pulse 76  Temp(Src) 98.1 F (36.7 C) (Oral)  Ht 6' (1.829 m)  Wt 177 lb 8 oz (80.513 kg)  BMI 24.07 kg/m2  Physical Exam  Nursing note and vitals reviewed. Constitutional: He is oriented to person, place, and time. He appears well-developed and well-nourished. No distress.  HENT:    Head: Normocephalic and atraumatic.  Right Ear: Hearing, tympanic membrane, external ear and ear canal normal.  Left Ear: Hearing, tympanic membrane, external ear and ear canal normal.  Nose: Nose normal.  Mouth/Throat: Uvula is midline, oropharynx is clear and moist and mucous membranes are normal. No oropharyngeal exudate, posterior oropharyngeal edema or posterior oropharyngeal erythema.  Slight pharyngeal erythema Ulcer at base of uvula on left  Eyes: Conjunctivae and EOM are normal. Pupils are equal, round, and reactive to light. No scleral icterus.  Neck: Normal range of motion. Neck supple. No thyromegaly present.  Cardiovascular: Normal rate, regular rhythm, normal heart sounds and intact distal pulses.   No murmur heard. Pulses:      Radial pulses are 2+ on the right side, and 2+ on the left side.  Pulmonary/Chest: Effort normal and breath sounds normal. No respiratory distress. He has no wheezes. He has no rales.  Abdominal: Soft. Bowel sounds are normal. He exhibits no distension and no mass. There is no tenderness. There is no rebound and no guarding.  Genitourinary: Rectum normal and prostate normal. Rectal exam shows no external hemorrhoid, no internal hemorrhoid, no fissure, no mass, no tenderness and anal tone normal. Prostate is not enlarged (20gm) and not tender.  Musculoskeletal: Normal range of motion. He exhibits no edema.  Lymphadenopathy:    He has no cervical adenopathy.  Neurological: He is alert and oriented to person, place, and time.  CN grossly intact, station and gait intact  Skin: Skin is warm and dry. No rash noted.  Psychiatric: He has a normal mood and affect. His behavior is normal. Judgment and thought content normal.   Results for orders placed in visit on 01/20/14  VALPROIC ACID LEVEL      Result Value Ref Range   Valproic Acid Lvl 73.9  50.0 - 100.0 ug/mL  COMPREHENSIVE METABOLIC PANEL      Result Value Ref Range   Sodium 136  135 - 145 mEq/L    Potassium 4.8  3.5 - 5.1 mEq/L   Chloride 98  96 - 112 mEq/L   CO2 29  19 - 32 mEq/L   Glucose, Bld 90  70 - 99 mg/dL   BUN 11  6 - 23 mg/dL   Creatinine, Ser 0.7  0.4 - 1.5 mg/dL   Total Bilirubin 1.2  0.3 - 1.2 mg/dL   Alkaline Phosphatase 54  39 - 117 U/L   AST 11  0 - 37 U/L   ALT 17  0 - 53 U/L   Total Protein 6.9  6.0 - 8.3 g/dL   Albumin 4.1  3.5 - 5.2 g/dL   Calcium 9.7  8.4 - 10.5 mg/dL   GFR 113.95  >60.00 mL/min  CBC WITH DIFFERENTIAL      Result Value Ref Range   WBC 4.5  4.5 - 10.5 K/uL   RBC 5.05  4.22 - 5.81 Mil/uL   Hemoglobin 15.6  13.0 - 17.0 g/dL   HCT 45.7  39.0 - 52.0 %   MCV 90.3  78.0 - 100.0 fl   MCHC 34.1  30.0 - 36.0 g/dL   RDW  14.3  11.5 - 14.6 %   Platelets 195.0  150.0 - 400.0 K/uL   Neutrophils Relative % 57.0  43.0 - 77.0 %   Lymphocytes Relative 29.1  12.0 - 46.0 %   Monocytes Relative 9.8  3.0 - 12.0 %   Eosinophils Relative 3.3  0.0 - 5.0 %   Basophils Relative 0.8  0.0 - 3.0 %   Neutro Abs 2.6  1.4 - 7.7 K/uL   Lymphs Abs 1.3  0.7 - 4.0 K/uL   Monocytes Absolute 0.4  0.1 - 1.0 K/uL   Eosinophils Absolute 0.2  0.0 - 0.7 K/uL   Basophils Absolute 0.0  0.0 - 0.1 K/uL  HEMOGLOBIN A1C      Result Value Ref Range   Hemoglobin A1C 5.3  4.6 - 6.5 %  VITAMIN D 25 HYDROXY      Result Value Ref Range   Vit D, 25-Hydroxy 61  30 - 89 ng/mL  PSA      Result Value Ref Range   PSA 1.76  0.10 - 4.00 ng/mL      Assessment & Plan:   Problem List Items Addressed This Visit   PARANOID SCHIZOPHRENIA     Chronic, stable, followed by [facility].    BENIGN PROSTATIC HYPERTROPHY     Stable.  H/o BPH per chart, but stable on exam today.    OSTEOPOROSIS     Pt will check on cost of dexa and then decide if he wants to pursue. Continue cal/vit D 2 tablets daily    Health maintenance examination - Primary     Preventative protocols reviewed and updated unless pt declined. Discussed healthy diet and lifestyle. iFOB today. DRE/PSA reassuring today.       Other Visit Diagnoses   Special screening for malignant neoplasms, colon        Relevant Orders       Fecal occult blood, imunochemical        Follow up plan: Return in about 6 months (around 07/30/2014), or as needed, for follow up.

## 2014-01-27 NOTE — Patient Instructions (Addendum)
Call your insurance company to ask how much a bone density scan will cost you.  Then let me know if you want to schedule it - this will be to see how strong your bones are. Call your insurace about the shingles shot to see if it is covered or how much it would cost and where is cheaper (here or pharmacy).  If you want to receive here, call for nurse visit. Pass by lab for a stool kit. Take copy of blood work to next appointment with Karen at [facility]. Good to see you today, call us with questions. Return in 6months for follow up visit, sooner if needed 

## 2014-01-27 NOTE — Assessment & Plan Note (Signed)
Chronic, stable, followed by [facility].

## 2014-01-27 NOTE — Progress Notes (Signed)
Pre visit review using our clinic review tool, if applicable. No additional management support is needed unless otherwise documented below in the visit note. 

## 2014-01-27 NOTE — Assessment & Plan Note (Signed)
Pt will check on cost of dexa and then decide if he wants to pursue. Continue cal/vit D 2 tablets daily

## 2014-02-03 ENCOUNTER — Other Ambulatory Visit: Payer: BC Managed Care – PPO

## 2014-02-03 ENCOUNTER — Encounter: Payer: Self-pay | Admitting: *Deleted

## 2014-02-03 DIAGNOSIS — Z1211 Encounter for screening for malignant neoplasm of colon: Secondary | ICD-10-CM

## 2014-02-03 LAB — FECAL OCCULT BLOOD, IMMUNOCHEMICAL: FECAL OCCULT BLD: NEGATIVE

## 2014-02-03 LAB — FECAL OCCULT BLOOD, GUAIAC: FECAL OCCULT BLD: NEGATIVE

## 2014-05-08 ENCOUNTER — Encounter: Payer: Self-pay | Admitting: Family Medicine

## 2014-05-08 ENCOUNTER — Ambulatory Visit: Payer: BC Managed Care – PPO | Admitting: Family Medicine

## 2014-05-08 ENCOUNTER — Ambulatory Visit (INDEPENDENT_AMBULATORY_CARE_PROVIDER_SITE_OTHER): Payer: BC Managed Care – PPO | Admitting: Family Medicine

## 2014-05-08 VITALS — BP 122/78 | HR 73 | Temp 98.0°F | Wt 182.8 lb

## 2014-05-08 DIAGNOSIS — R319 Hematuria, unspecified: Secondary | ICD-10-CM

## 2014-05-08 LAB — POCT URINALYSIS DIPSTICK
BILIRUBIN UA: NEGATIVE
Glucose, UA: NEGATIVE
Ketones, UA: NEGATIVE
NITRITE UA: NEGATIVE
PH UA: 6.5
PROTEIN UA: NEGATIVE
Spec Grav, UA: 1.01
Urobilinogen, UA: 4

## 2014-05-08 MED ORDER — CIPROFLOXACIN HCL 250 MG PO TABS: 250.0000 mg | ORAL_TABLET | Freq: Two times a day (BID) | ORAL | Status: DC

## 2014-05-08 NOTE — Patient Instructions (Signed)
Drink plenty of water and start the antibiotics today.  We'll contact you with your lab report.  Take care.   Glad to see you. Notify us if you aren't getting better in a few days.

## 2014-05-08 NOTE — Assessment & Plan Note (Signed)
Possible uti, start cipro and check ucx.  D/w pt.  He agrees.  Nontoxic. He'll update [place].  Okay for outpatient f/u.

## 2014-05-08 NOTE — Progress Notes (Signed)
Pre visit review using our clinic review tool, if applicable. No additional management support is needed unless otherwise documented below in the visit note.  He had noted some blood in urine yesterday and the day before.  Pain with urination.  Bladder pressure and urgency.  This AM his urine was more dilute, clearer.  No fevers documented. No vomiting.  No diarrhea.  Some nausea.  No pain when sitting down.    Meds, vitals, and allergies reviewed.   ROS: See HPI.  Otherwise, noncontributory.  Nad ncat Mmm rrr abd soft, not ttp Ext w/o edema No CVA pain

## 2014-05-10 LAB — URINE CULTURE
Colony Count: NO GROWTH
Organism ID, Bacteria: NO GROWTH

## 2014-05-14 ENCOUNTER — Telehealth: Payer: Self-pay | Admitting: Family Medicine

## 2014-05-14 NOTE — Telephone Encounter (Signed)
Thanks, routed to PCP as FYI.  

## 2014-05-14 NOTE — Telephone Encounter (Signed)
He had a presumed UTI, with the abnormal u/a.  The ucx was negative, which can occ happen with a UTI.  If he had been improving, then I would finish the antibiotics.  If he is still having other symptoms (or prev sx that continue), then he needs to be rechecked.   I don't recall the HA or the insomnia being issues that he reported.   Did his urinary sx improve at all?  He may have mult unrelated issues going on concurrently.

## 2014-05-14 NOTE — Telephone Encounter (Signed)
To MD who saw him

## 2014-05-14 NOTE — Telephone Encounter (Signed)
Pt wants to know what exactly was wrong, if he did not have UTI. Pt is still on antibotic. Suffering from headaches, not sleeping well. Shouldhe follow-up with a urologist. Last saw a urologist 6-7 years ago. Please advise. Thank you!

## 2014-05-14 NOTE — Telephone Encounter (Signed)
Patient notified as instructed by telephone. Was advised by patient that he is doing a lot better and the symptoms have improved greatly. Patient stated that his headache is a lot better and thinks that it may have been a tension headache because of the stress that he has been under. Patient stated that he is sleeping better and feels that may have been caused by him having to get up and go to the bathroom so much. Advised patient that is the symptoms return to call the office to be rechecked. Patient agreed that he will call for an appointment if symptoms return.

## 2014-05-28 ENCOUNTER — Telehealth: Payer: Self-pay | Admitting: Family Medicine

## 2014-05-28 NOTE — Telephone Encounter (Signed)
Received 3 pages from DDS, sent to Dr. Danise Mina @ [facility] via inneroffice. 05/28/14/ss

## 2014-05-29 ENCOUNTER — Encounter: Payer: Self-pay | Admitting: Family Medicine

## 2014-07-17 ENCOUNTER — Telehealth: Payer: Self-pay

## 2014-07-17 NOTE — Telephone Encounter (Signed)
Pt left v/m; pt wants to get shingles vaccine and flu shot at 07/30/14 appt with Dr Darnell Level. Left v/m for pt to cb.

## 2014-07-17 NOTE — Telephone Encounter (Signed)
Pt called back and will ck with ins co about coverage for shingles and flu shots; pt hopes to get at 07/30/14 appt.

## 2014-07-22 NOTE — Telephone Encounter (Signed)
Pt spoke with ins co and shingles and flu vaccines are covered at Dr Synthia Innocent office and pt request to get immunizations when seen 07/30/14. Pt will discuss with Dr Darnell Level at appt.

## 2014-07-30 ENCOUNTER — Ambulatory Visit (INDEPENDENT_AMBULATORY_CARE_PROVIDER_SITE_OTHER): Payer: BC Managed Care – PPO | Admitting: Family Medicine

## 2014-07-30 ENCOUNTER — Encounter: Payer: Self-pay | Admitting: Family Medicine

## 2014-07-30 VITALS — BP 128/82 | HR 84 | Temp 97.0°F | Wt 181.5 lb

## 2014-07-30 DIAGNOSIS — F200 Paranoid schizophrenia: Secondary | ICD-10-CM

## 2014-07-30 DIAGNOSIS — M810 Age-related osteoporosis without current pathological fracture: Secondary | ICD-10-CM

## 2014-07-30 DIAGNOSIS — F1010 Alcohol abuse, uncomplicated: Secondary | ICD-10-CM

## 2014-07-30 DIAGNOSIS — F1090 Alcohol use, unspecified, uncomplicated: Secondary | ICD-10-CM | POA: Insufficient documentation

## 2014-07-30 DIAGNOSIS — Z7289 Other problems related to lifestyle: Secondary | ICD-10-CM | POA: Insufficient documentation

## 2014-07-30 DIAGNOSIS — K4090 Unilateral inguinal hernia, without obstruction or gangrene, not specified as recurrent: Secondary | ICD-10-CM | POA: Insufficient documentation

## 2014-07-30 DIAGNOSIS — Z23 Encounter for immunization: Secondary | ICD-10-CM

## 2014-07-30 NOTE — Progress Notes (Signed)
BP 128/82 mmHg  Pulse 84  Temp(Src) 97 F (36.1 C) (Oral)  Wt 181 lb 8 oz (82.328 kg)   CC: f/u visit  Subjective:    Patient ID: Robert Fowler, male    DOB: 1951-09-07, 63 y.o.   MRN: [id number]  HPI: Robert Fowler is a 63 y.o. male presenting on 07/30/2014 for Follow-up   He has paranoid schizophrenia with depression and is on abilify and prozac and depakote and cogentin. Followed by Robert Fowler. Sees Robert Good NP. Working on decreasing alcohol together with Robert Fowler. Planning on seeing Robert Fowler tomorrow - advised to go to [name]. Drinks 4-5 glasses wine daily. Has some withdrawals. Has been going to group therapy.   Osteoporosis - did not undergo DEXA 2/2 financial concerns. stopped calcium. Takes MVI.   Over last 4-5 mo noticing bulge in R inguinal area. Noted after heavy lifting at brother's house (heavy tank with pump). Not painful. More noticeable when coughing.   Requests flu and shingles shot today.  Relevant past medical, surgical, family and social history reviewed and updated as indicated.  Allergies and medications reviewed and updated. Current Outpatient Prescriptions on File Prior to Visit  Medication Sig  . acetaminophen (TYLENOL) 500 MG tablet Take 1,000 mg by mouth every 6 (six) hours as needed for pain (pain).  . ARIPiprazole (ABILIFY) 20 MG tablet Take 20 mg by mouth daily.  . benztropine (COGENTIN) 1 MG tablet Take 1 mg by mouth at bedtime.   . divalproex (DEPAKOTE ER) 500 MG 24 hr tablet Take 1,000 mg by mouth at bedtime.   Marland Kitchen FLUoxetine (PROZAC) 20 MG capsule Take 20 mg by mouth daily after breakfast.  . Menthol-Methyl Salicylate (MUSCLE RUB) 10-15 % CREA Apply 1 application topically daily as needed (muscle pain.).  Marland Kitchen Multiple Vitamin (MULTIVITAMIN WITH MINERALS) TABS Take 1 tablet by mouth daily after breakfast.   No current facility-administered medications on file prior to visit.    Review of Systems Per HPI unless specifically indicated above      Objective:    BP 128/82 mmHg  Pulse 84  Temp(Src) 97 F (36.1 C) (Oral)  Wt 181 lb 8 oz (82.328 kg)  Physical Exam  Constitutional: He appears well-developed and well-nourished. No distress.  HENT:  Mouth/Throat: Oropharynx is clear and moist. No oropharyngeal exudate.  Cardiovascular: Normal rate, regular rhythm, normal heart sounds and intact distal pulses.   No murmur heard. Pulmonary/Chest: Effort normal and breath sounds normal. No respiratory distress. He has no wheezes. He has no rales.  Abdominal: Soft. Normal appearance and bowel sounds are normal. He exhibits no distension and no mass. There is no tenderness. There is no rigidity, no rebound, no guarding, no CVA tenderness and negative Murphy's sign. A hernia is present. Hernia confirmed positive in the right inguinal area (easily reducible). Hernia confirmed negative in the left inguinal area.  Musculoskeletal: He exhibits no edema.  Psychiatric: He has a normal mood and affect.  Nursing note and vitals reviewed.      Assessment & Plan:   Problem List Items Addressed This Visit    Right inguinal hernia - Primary    Rec referral to surgery for eval, pt will check with family and let me know.    Paranoid schizophrenia    Chronic, stable. followed by Robert Fowler    Osteoporosis    Encouraged restart cal/vit D 1 tab. Pt did not have dexa done.    Habitual alcohol use    Drinks 4-5 glasses of  wine a night. Encouraged he slowly cut down by 1 glass every 2 weeks to avoid withdrawals.        Follow up plan: Return in about 6 months (around 01/28/2015), or as needed, for annual exam, prior fasting for blood work.

## 2014-07-30 NOTE — Progress Notes (Signed)
Pre visit review using our clinic review tool, if applicable. No additional management support is needed unless otherwise documented below in the visit note. 

## 2014-07-30 NOTE — Addendum Note (Signed)
Addended by: Royann Shivers A on: 07/30/2014 09:04 AM   Modules accepted: Orders

## 2014-07-30 NOTE — Assessment & Plan Note (Signed)
Drinks 4-5 glasses of wine a night. Encouraged he slowly cut down by 1 glass every 2 weeks to avoid withdrawals.

## 2014-07-30 NOTE — Assessment & Plan Note (Signed)
Chronic, stable. followed by Robert Fowler

## 2014-07-30 NOTE — Assessment & Plan Note (Signed)
Encouraged restart cal/vit D 1 tab. Pt did not have dexa done.

## 2014-07-30 NOTE — Patient Instructions (Addendum)
Flu and shingles shot today. Restart calcium and vitamin D one a day.  I recommend evaluation by surgery for right inguinal hernia. Let me know if you'd like to do this. I agree with slowly cutting down on alcohol by 1 drink over several months.  Good to see you today, call us with questions.  Return as needed or in 6 months for annual exam.

## 2014-07-30 NOTE — Assessment & Plan Note (Addendum)
Rec referral to surgery for eval, pt will check with family and let me know.

## 2014-08-04 ENCOUNTER — Telehealth: Payer: Self-pay | Admitting: Family Medicine

## 2014-08-04 DIAGNOSIS — K4090 Unilateral inguinal hernia, without obstruction or gangrene, not specified as recurrent: Secondary | ICD-10-CM

## 2014-08-04 NOTE — Telephone Encounter (Signed)
referral placed

## 2014-08-04 NOTE — Telephone Encounter (Signed)
Pt called and would like to go ahead with the surgery referral for his hernia. Please advise  Pt prefers Letta Kocher [phone] [facility] to leave detailed message)

## 2014-08-08 ENCOUNTER — Ambulatory Visit (INDEPENDENT_AMBULATORY_CARE_PROVIDER_SITE_OTHER): Payer: Self-pay | Admitting: Surgery

## 2014-08-08 NOTE — H&P (Signed)
History of Present Illness Robert Fowler. Robert Demicco MD; 08/08/2014 11:58 AM) Patient words: eval possible right inguinal hernia.  The patient is a 63 year old male who presents with an inguinal hernia. Referred by Dr. Danise Mina for evaluation of right inguinal hernia This is a 63 yo male with paranoid schizophrenia who presents with several months of an enlarging bulge in his right groin. He helped his brother move a heavy piece of equipment about four months ago. Subsequently, he developed a bulge that has become larger, but remains reducible. He denies any obstructive symptoms. Dr. Danise Mina examined him and felt that he had an inguinal hernia. He presents now for surgical evaluation. Other Problems Robert Laster, MA; 08/08/2014 11:08 AM) Alcohol Abuse Depression Hemorrhoids Inguinal Hernia Sleep Apnea  Past Surgical History Robert Laster, MA; 08/08/2014 11:08 AM) No pertinent past surgical history  Diagnostic Studies History Robert Fowler, [place]; 08/08/2014 11:08 AM) Colonoscopy never  Allergies Robert Laster, MA; 08/08/2014 11:09 AM) No Known Drug Allergies 08/08/2014  Medication History Robert Laster, MA; 08/08/2014 11:10 AM) ARIPiprazole (20MG  Tablet, Oral qd) Active. Benztropine Mesylate (1MG  Tablet, Oral qd\) Active. Divalproex Sodium ER (500MG  Tablet ER 24HR, Oral qd) Active. FLUoxetine HCl (20MG  Capsule, Oral qd) Active. Tylenol Extra Strength (500MG  Tablet, Oral prn) Active. Calcium Citrate + D3 Maximum (315-250MG -UNIT Tablet, Oral qd) Active. Multivitamins (Oral qd) Active. Muscle Rub (External prn) Active.  Social History Robert Fowler, [place]; 08/08/2014 11:08 AM) Alcohol use Moderate alcohol use. Caffeine use Carbonated beverages, Coffee. No drug use Tobacco use Former smoker.  Family History Robert Fowler, [place]; 08/08/2014 11:08 AM) Alcohol Abuse Brother, Father. Respiratory Condition Mother.     Review of Systems Robert Shan  [place] MA; 08/08/2014 11:08 AM) General Not Present- Appetite Loss, Chills, Fatigue, Fever, Night Sweats, Weight Gain and Weight Loss. Skin Not Present- Change in Wart/Mole, Dryness, Hives, Jaundice, New Lesions, Non-Healing Wounds, Rash and Ulcer. HEENT Present- Wears glasses/contact lenses. Not Present- Earache, Hearing Loss, Hoarseness, Nose Bleed, Oral Ulcers, Ringing in the Ears, Seasonal Allergies, Sinus Pain, Sore Throat, Visual Disturbances and Yellow Eyes. Respiratory Not Present- Bloody sputum, Chronic Cough, Difficulty Breathing, Snoring and Wheezing. Breast Not Present- Breast Mass, Breast Pain, Nipple Discharge and Skin Changes. Cardiovascular Not Present- Chest Pain, Difficulty Breathing Lying Down, Leg Cramps, Palpitations, Rapid Heart Rate, Shortness of Breath and Swelling of Extremities. Gastrointestinal Present- Excessive gas. Not Present- Abdominal Pain, Bloating, Bloody Stool, Change in Bowel Habits, Chronic diarrhea, Constipation, Difficulty Swallowing, Gets full quickly at meals, Hemorrhoids, Indigestion, Nausea, Rectal Pain and Vomiting. Male Genitourinary Not Present- Blood in Urine, Change in Urinary Stream, Frequency, Impotence, Nocturia, Painful Urination, Urgency and Urine Leakage. Psychiatric Present- Bipolar and Depression. Not Present- Anxiety, Change in Sleep Pattern, Fearful and Frequent crying.  Vitals Robert Shan [place] MA; 08/08/2014 11:08 AM) 08/08/2014 11:08 AM Weight: 184.2 lb Height: 72in Body Surface Area: 2.06 m Body Mass Index: 24.98 kg/m Temp.: 96.73F(Oral)  Pulse: 72 (Regular)  Resp.: 16 (Unlabored)  BP: 138/82 (Sitting, Left Arm, Standard)     Assessment & Plan Robert Fowler K. Mizani Dilday MD; 08/08/2014 11:25 AM)  REDUCIBLE RIGHT INGUINAL HERNIA (550.90  K40.90)  Current Plans Schedule for Surgery   Right inguinal hernia repair with mesh. The surgical procedure has been discussed with the patient. Potential risks, benefits, alternative  treatments, and expected outcomes have been explained. All of the patient's questions at this time have been answered. The likelihood of reaching the patient's treatment goal is good. The patient understand the proposed surgical procedure and wishes to proceed.  Robert Fowler. Robert Dover, MD, [facility] Surgery  General/ Trauma Surgery  08/08/2014 12:36 PM

## 2014-08-26 HISTORY — PX: INGUINAL HERNIA REPAIR: SUR1180

## 2014-11-07 ENCOUNTER — Encounter: Payer: Self-pay | Admitting: Internal Medicine

## 2015-01-28 ENCOUNTER — Ambulatory Visit (INDEPENDENT_AMBULATORY_CARE_PROVIDER_SITE_OTHER): Payer: BLUE CROSS/BLUE SHIELD | Admitting: Family Medicine

## 2015-01-28 ENCOUNTER — Encounter: Payer: Self-pay | Admitting: Family Medicine

## 2015-01-28 VITALS — BP 124/82 | HR 80 | Temp 98.0°F | Wt 187.5 lb

## 2015-01-28 DIAGNOSIS — F200 Paranoid schizophrenia: Secondary | ICD-10-CM | POA: Diagnosis not present

## 2015-01-28 DIAGNOSIS — M810 Age-related osteoporosis without current pathological fracture: Secondary | ICD-10-CM

## 2015-01-28 DIAGNOSIS — F1010 Alcohol abuse, uncomplicated: Secondary | ICD-10-CM | POA: Diagnosis not present

## 2015-01-28 DIAGNOSIS — Z7289 Other problems related to lifestyle: Secondary | ICD-10-CM

## 2015-01-28 DIAGNOSIS — F1090 Alcohol use, unspecified, uncomplicated: Secondary | ICD-10-CM

## 2015-01-28 NOTE — Patient Instructions (Addendum)
Good to see you today.  Continue working towards decreased alcohol.  Keep next month's appointment for physical.

## 2015-01-28 NOTE — Assessment & Plan Note (Addendum)
Taking calcium daily. Has not undergone DEXA. Advised to let us know if decides to pursue imaging.

## 2015-01-28 NOTE — Assessment & Plan Note (Signed)
Stable, followed by [facility]

## 2015-01-28 NOTE — Progress Notes (Signed)
   BP 124/82 mmHg  Pulse 80  Temp(Src) 98 F (36.7 C) (Oral)  Wt 187 lb 8 oz (85.049 kg)   CC: 74mo f/u visit  Subjective:    Patient ID: Robert Fowler, male    DOB: [birth year], 64 y.o.   MRN: [id number]  HPI: Robert Fowler is a 64 y.o. male presenting on 01/28/2015 for Follow-up   He has paranoid schizophrenia with depression and is on abilify and prozac and depakote and cogentin. Followed by [facility]. Sees Dr Isabell Jarvis.Notes increased restlessness, some trouble sleeping.   Habitual drinking - down to 3 drinks/day. Has decided not to go to AA. Doesn't think he has a problem.  He had R inguinal hernia repaired by Dr Gershon Crane 08/2014 and has recovered well from this.   Osteoporosis - has declined DEXA 2/2 financial concerns.  Relevant past medical, surgical, family and social history reviewed and updated as indicated. Interim medical history since our last visit reviewed. Allergies and medications reviewed and updated. Current Outpatient Prescriptions on File Prior to Visit  Medication Sig  . acetaminophen (TYLENOL) 500 MG tablet Take 1,000 mg by mouth every 6 (six) hours as needed for pain (pain).  . ARIPiprazole (ABILIFY) 20 MG tablet Take 20 mg by mouth daily.  . benztropine (COGENTIN) 1 MG tablet Take 1 mg by mouth at bedtime.   . divalproex (DEPAKOTE ER) 500 MG 24 hr tablet Take 1,000 mg by mouth at bedtime.   Marland Kitchen FLUoxetine (PROZAC) 20 MG capsule Take 20 mg by mouth daily after breakfast.  . Menthol-Methyl Salicylate (MUSCLE RUB) 10-15 % CREA Apply 1 application topically daily as needed (muscle pain.).   No current facility-administered medications on file prior to visit.    Review of Systems Per HPI unless specifically indicated above     Objective:    BP 124/82 mmHg  Pulse 80  Temp(Src) 98 F (36.7 C) (Oral)  Wt 187 lb 8 oz (85.049 kg)  Wt Readings from Last 3 Encounters:  01/28/15 187 lb 8 oz (85.049 kg)  07/30/14 181 lb 8 oz (82.328 kg)  05/08/14 182 lb 12 oz (82.895  kg)    Physical Exam  Constitutional: He appears well-developed and well-nourished. No distress.  Cardiovascular: Normal rate, regular rhythm, normal heart sounds and intact distal pulses.   No murmur heard. Pulmonary/Chest: Effort normal and breath sounds normal. No respiratory distress. He has no wheezes. He has no rales.  Musculoskeletal: He exhibits no edema.  Psychiatric: He has a normal mood and affect. His speech is normal and behavior is normal. Thought content normal.  talkative  Nursing note and vitals reviewed.     Assessment & Plan:   Problem List Items Addressed This Visit    Paranoid schizophrenia    Stable, followed by [facility]      Osteoporosis - Primary    Taking calcium daily. Has not undergone DEXA. Advised to let us know if decides to pursue imaging.      Relevant Medications   calcium carbonate (OS-CAL) 600 MG TABS tablet   Habitual alcohol use    Continue to encourage slow cessation (currently at 3 drinks a day)          Follow up plan: No Follow-up on file.

## 2015-01-28 NOTE — Progress Notes (Signed)
Pre visit review using our clinic review tool, if applicable. No additional management support is needed unless otherwise documented below in the visit note. 

## 2015-01-28 NOTE — Assessment & Plan Note (Signed)
Continue to encourage slow cessation (currently at 3 drinks a day)

## 2015-02-24 ENCOUNTER — Other Ambulatory Visit: Payer: Self-pay

## 2015-02-25 ENCOUNTER — Other Ambulatory Visit: Payer: Self-pay | Admitting: Family Medicine

## 2015-02-25 DIAGNOSIS — Z5181 Encounter for therapeutic drug level monitoring: Secondary | ICD-10-CM

## 2015-02-25 DIAGNOSIS — N400 Enlarged prostate without lower urinary tract symptoms: Secondary | ICD-10-CM

## 2015-02-25 DIAGNOSIS — M810 Age-related osteoporosis without current pathological fracture: Secondary | ICD-10-CM

## 2015-02-25 DIAGNOSIS — F200 Paranoid schizophrenia: Secondary | ICD-10-CM

## 2015-02-26 ENCOUNTER — Other Ambulatory Visit (INDEPENDENT_AMBULATORY_CARE_PROVIDER_SITE_OTHER): Payer: Commercial Managed Care - HMO

## 2015-02-26 DIAGNOSIS — M810 Age-related osteoporosis without current pathological fracture: Secondary | ICD-10-CM | POA: Diagnosis not present

## 2015-02-26 DIAGNOSIS — N400 Enlarged prostate without lower urinary tract symptoms: Secondary | ICD-10-CM | POA: Diagnosis not present

## 2015-02-26 DIAGNOSIS — Z5181 Encounter for therapeutic drug level monitoring: Secondary | ICD-10-CM

## 2015-02-26 DIAGNOSIS — F200 Paranoid schizophrenia: Secondary | ICD-10-CM

## 2015-02-26 LAB — PSA: PSA: 0.61 ng/mL (ref 0.10–4.00)

## 2015-02-26 LAB — BASIC METABOLIC PANEL
BUN: 12 mg/dL (ref 6–23)
CO2: 30 mEq/L (ref 19–32)
CREATININE: 0.79 mg/dL (ref 0.40–1.50)
Calcium: 9.8 mg/dL (ref 8.4–10.5)
Chloride: 97 mEq/L (ref 96–112)
GFR: 105.29 mL/min (ref >60.00)
Glucose, Bld: 97 mg/dL (ref 70–99)
POTASSIUM: 4.1 meq/L (ref 3.5–5.1)
SODIUM: 133 meq/L — AB (ref 135–145)

## 2015-02-26 LAB — VITAMIN D 25 HYDROXY (VIT D DEFICIENCY, FRACTURES): VITD: 49.03 ng/mL (ref 30.00–100.00)

## 2015-02-26 LAB — LIPID PANEL
CHOLESTEROL: 181 mg/dL (ref 0–200)
HDL: 80.5 mg/dL (ref >39.00)
LDL CALC: 88 mg/dL (ref 0–99)
NONHDL: 100.5
Total CHOL/HDL Ratio: 2
Triglycerides: 64 mg/dL (ref 0.0–149.0)
VLDL: 12.8 mg/dL (ref 0.0–40.0)

## 2015-02-27 LAB — VALPROIC ACID LEVEL: Valproic Acid Lvl: 49.3 ug/mL — ABNORMAL LOW (ref 50.0–100.0)

## 2015-03-03 ENCOUNTER — Encounter: Payer: Self-pay | Admitting: Family Medicine

## 2015-03-03 ENCOUNTER — Ambulatory Visit (INDEPENDENT_AMBULATORY_CARE_PROVIDER_SITE_OTHER): Payer: Commercial Managed Care - HMO | Admitting: Family Medicine

## 2015-03-03 VITALS — BP 136/86 | HR 68 | Temp 98.0°F | Ht 72.0 in | Wt 186.5 lb

## 2015-03-03 DIAGNOSIS — Z7189 Other specified counseling: Secondary | ICD-10-CM | POA: Insufficient documentation

## 2015-03-03 DIAGNOSIS — Z1211 Encounter for screening for malignant neoplasm of colon: Secondary | ICD-10-CM

## 2015-03-03 DIAGNOSIS — F200 Paranoid schizophrenia: Secondary | ICD-10-CM

## 2015-03-03 DIAGNOSIS — M810 Age-related osteoporosis without current pathological fracture: Secondary | ICD-10-CM

## 2015-03-03 DIAGNOSIS — Z87891 Personal history of nicotine dependence: Secondary | ICD-10-CM

## 2015-03-03 DIAGNOSIS — Z7289 Other problems related to lifestyle: Secondary | ICD-10-CM

## 2015-03-03 DIAGNOSIS — Z0000 Encounter for general adult medical examination without abnormal findings: Secondary | ICD-10-CM | POA: Diagnosis not present

## 2015-03-03 DIAGNOSIS — F1090 Alcohol use, unspecified, uncomplicated: Secondary | ICD-10-CM

## 2015-03-03 DIAGNOSIS — N400 Enlarged prostate without lower urinary tract symptoms: Secondary | ICD-10-CM

## 2015-03-03 NOTE — Assessment & Plan Note (Signed)
Remains abstinent. 30+PY hx but not 64yo yet so will not order screening [employer] [place].

## 2015-03-03 NOTE — Assessment & Plan Note (Addendum)
I have personally reviewed the Medicare Annual Wellness questionnaire and have noted 1. The patient's medical and social history 2. Their use of alcohol, tobacco or illicit drugs 3. Their current medications and supplements 4. The patient's functional ability including ADL's, fall risks, home safety risks and hearing or visual impairment. 5. Diet and physical activity 6. Evidence for depression or mood disorders The patients weight, height, BMI have been recorded in the chart.  Hearing and vision has been addressed. I have made referrals, counseling and provided education to the patient based review of the above and I have provided the pt with a written personalized care plan for preventive services. Provider list updated - see scanned questionairre. Reviewed preventative protocols and updated unless pt declined.  Baseline EKG today - NSR rate 60, normal axis, intervals, no acute ST/T changes.

## 2015-03-03 NOTE — Assessment & Plan Note (Signed)
Encouraged continued f/u with psych.

## 2015-03-03 NOTE — Assessment & Plan Note (Signed)
DRE/PSA reassuring.  

## 2015-03-03 NOTE — Assessment & Plan Note (Signed)
Continue calcium daily. Agrees to DEXA today.

## 2015-03-03 NOTE — Assessment & Plan Note (Signed)
Continued to encourage slow cessation - down to 2-3 drinks a day

## 2015-03-03 NOTE — Addendum Note (Signed)
Addended by: Royann Shivers A on: 03/03/2015 12:39 PM   Modules accepted: Orders

## 2015-03-03 NOTE — Assessment & Plan Note (Signed)
Advanced directive: packet provided. Discussed with patient. He doesn't have this set up. Would want [name] or [name] sister/step-sister to be HCPOA.

## 2015-03-03 NOTE — Progress Notes (Signed)
Pre visit review using our clinic review tool, if applicable. No additional management support is needed unless otherwise documented below in the visit note. 

## 2015-03-03 NOTE — Progress Notes (Addendum)
BP 136/86 mmHg  Pulse 68  Temp(Src) 98 F (36.7 C) (Oral)  Ht 6' (1.829 m)  Wt 186 lb 8 oz (84.596 kg)  BMI 25.29 kg/m2   CC: CPE  Subjective:    Patient ID: Robert Fowler, male    DOB: Apr 13, 1951, 64 y.o.   MRN: [id number]  HPI: Robert Fowler is a 64 y.o. male presenting on 03/03/2015 for Annual Exam   Now has [employer] - but his psychiatrist doesn't take [facility]. Requests to continue seeing Dr Isabell Jarvis for now. Aware he will pay out of pocket.  Ex-smoker - quit 8 yrs ago. 1-2 ppd for 30 yrs. However, he's not 64yo.   He has paranoid schizophrenia with depression and is on abilify and prozac and depakote and cogentin. Followed by [facility]. Sees Dr Isabell Jarvis.Lithium caused restlessness, haldol caused oversedation.   Habitual drinking - 2-3 drinks/day.   Hearing screen passed today Vision screen at eye doctor 2+ falls in last year - "miss steps sometimes" PHQ2 = 1.  Preventative: Colon cancer screening - discussed, would like iFOB.  Prostate cancer screening - discussed, would like to have this checked. Nocturia x0-2. H/o BPH per chart. DEXA - will schedule today. Flu shot 07/2014 Tdap 10/2013 zostavax - 07/2014 Advanced directive: packet provided. Discussed with patient. He doesn't have this set up. Would want [name] or [name] step-sister/step-sister to be HCPOA.  Seat belt and sunscreen use discussed. No changing moles on skin.   Lives alone Mother and brother live nearby. Occupation: Retail buyer at [employer] Edu: 12th grade Activity: mows yard Diet: good water, fruits/vegetables daily  Relevant past medical, surgical, family and social history reviewed and updated as indicated. Interim medical history since our last visit reviewed. Allergies and medications reviewed and updated. Current Outpatient Prescriptions on File Prior to Visit  Medication Sig  . acetaminophen (TYLENOL) 500 MG tablet Take 1,000 mg by mouth every 6 (six) hours as needed for pain (pain).  . ARIPiprazole  (ABILIFY) 20 MG tablet Take 20 mg by mouth daily.  Marland Kitchen aspirin 81 MG tablet Take 81 mg by mouth daily.  . benztropine (COGENTIN) 1 MG tablet Take 1 mg by mouth at bedtime.   . calcium carbonate (OS-CAL) 600 MG TABS tablet Take 600 mg by mouth daily with breakfast.  . divalproex (DEPAKOTE ER) 500 MG 24 hr tablet Take 1,000 mg by mouth at bedtime.   Marland Kitchen FLUoxetine (PROZAC) 20 MG capsule Take 20 mg by mouth daily after breakfast.  . Multiple Vitamin (MULTIVITAMIN) tablet Take 1 tablet by mouth daily.  . Menthol-Methyl Salicylate (MUSCLE RUB) 10-15 % CREA Apply 1 application topically daily as needed (muscle pain.).   No current facility-administered medications on file prior to visit.    Review of Systems  Constitutional: Negative for fever, chills, activity change, appetite change, fatigue and unexpected weight change.  HENT: Negative for hearing loss.   Eyes: Negative for visual disturbance.  Respiratory: Positive for shortness of breath. Negative for cough, chest tightness and wheezing.   Cardiovascular: Negative for chest pain, palpitations and leg swelling.  Gastrointestinal: Negative for nausea, vomiting, abdominal pain, diarrhea, constipation, blood in stool and abdominal distention.  Genitourinary: Negative for hematuria and difficulty urinating.  Musculoskeletal: Negative for myalgias, arthralgias and neck pain.  Skin: Negative for rash.  Neurological: Negative for dizziness, seizures, syncope and headaches.  Hematological: Negative for adenopathy. Does not bruise/bleed easily.  Psychiatric/Behavioral: Negative for dysphoric mood. The patient is not nervous/anxious.    Per HPI unless specifically indicated above  Objective:    BP 136/86 mmHg  Pulse 68  Temp(Src) 98 F (36.7 C) (Oral)  Ht 6' (1.829 m)  Wt 186 lb 8 oz (84.596 kg)  BMI 25.29 kg/m2  Wt Readings from Last 3 Encounters:  03/03/15 186 lb 8 oz (84.596 kg)  01/28/15 187 lb 8 oz (85.049 kg)  07/30/14 181 lb 8 oz  (82.328 kg)    Physical Exam  Constitutional: He is oriented to person, place, and time. He appears well-developed and well-nourished. No distress.  HENT:  Head: Normocephalic and atraumatic.  Right Ear: Hearing, tympanic membrane, external ear and ear canal normal.  Left Ear: Hearing, tympanic membrane, external ear and ear canal normal.  Nose: Nose normal.  Mouth/Throat: Uvula is midline, oropharynx is clear and moist and mucous membranes are normal. No oropharyngeal exudate, posterior oropharyngeal edema or posterior oropharyngeal erythema.  Eyes: Conjunctivae and EOM are normal. Pupils are equal, round, and reactive to light. No scleral icterus.  Neck: Normal range of motion. Neck supple. Carotid bruit is not present. No thyromegaly present.  Cardiovascular: Normal rate, regular rhythm, normal heart sounds and intact distal pulses.   No murmur heard. Pulses:      Radial pulses are 2+ on the right side, and 2+ on the left side.  Pulmonary/Chest: Effort normal and breath sounds normal. No respiratory distress. He has no wheezes. He has no rales.  Abdominal: Soft. Bowel sounds are normal. He exhibits no distension and no mass. There is no tenderness. There is no rebound and no guarding.  Genitourinary: Rectum normal and prostate normal. Rectal exam shows no external hemorrhoid, no internal hemorrhoid, no fissure, no mass, no tenderness and anal tone normal. Prostate is not enlarged (20gm) and not tender.  Musculoskeletal: Normal range of motion. He exhibits no edema.  Lymphadenopathy:    He has no cervical adenopathy.  Neurological: He is alert and oriented to person, place, and time.  CN grossly intact, station and gait intact Recall 1/3, 3/3 with cue Calculation 5/5 serial 3s  Skin: Skin is warm and dry. No rash noted.  Psychiatric: He has a normal mood and affect. His behavior is normal. Judgment and thought content normal.  Nursing note and vitals reviewed.  Results for orders  placed or performed in visit on 02/26/15  Lipid panel  Result Value Ref Range   Cholesterol 181 0 - 200 mg/dL   Triglycerides 64.0 0.0 - 149.0 mg/dL   HDL 80.50 >39.00 mg/dL   VLDL 12.8 0.0 - 40.0 mg/dL   LDL Cholesterol 88 0 - 99 mg/dL   Total CHOL/HDL Ratio 2    NonHDL 100.50   PSA  Result Value Ref Range   PSA 0.61 0.10 - 4.00 ng/mL  Basic metabolic panel  Result Value Ref Range   Sodium 133 (L) 135 - 145 mEq/L   Potassium 4.1 3.5 - 5.1 mEq/L   Chloride 97 96 - 112 mEq/L   CO2 30 19 - 32 mEq/L   Glucose, Bld 97 70 - 99 mg/dL   BUN 12 6 - 23 mg/dL   Creatinine, Ser 0.79 0.40 - 1.50 mg/dL   Calcium 9.8 8.4 - 10.5 mg/dL   GFR 105.29 >60.00 mL/min  Valproic acid level  Result Value Ref Range   Valproic Acid Lvl 49.3 (L) 50.0 - 100.0 ug/mL  Vit D  25 hydroxy (rtn osteoporosis monitoring)  Result Value Ref Range   VITD 49.03 30.00 - 100.00 ng/mL      Assessment & Plan:  Problem List Items Addressed This Visit    Advanced care planning/counseling discussion    Advanced directive: packet provided. Discussed with patient. He doesn't have this set up. Would want [name] or [name] sister/step-sister to be HCPOA.       BPH (benign prostatic hypertrophy)    DRE/PSA reassuring      Ex-smoker    Remains abstinent. 30+PY hx but not 64yo yet so will not order screening [employer] [place].      Habitual alcohol use    Continued to encourage slow cessation - down to 2-3 drinks a day      Health maintenance examination    Preventative protocols reviewed and updated unless pt declined. Discussed healthy diet and lifestyle.       Osteoporosis    Continue calcium daily. Agrees to DEXA today.      Relevant Orders   DG Bone Density   Paranoid schizophrenia    Encouraged continued f/u with psych.      Welcome to Medicare preventive visit - Primary    I have personally reviewed the Medicare Annual Wellness questionnaire and have noted 1. The patient's medical and social  history 2. Their use of alcohol, tobacco or illicit drugs 3. Their current medications and supplements 4. The patient's functional ability including ADL's, fall risks, home safety risks and hearing or visual impairment. 5. Diet and physical activity 6. Evidence for depression or mood disorders The patients weight, height, BMI have been recorded in the chart.  Hearing and vision has been addressed. I have made referrals, counseling and provided education to the patient based review of the above and I have provided the pt with a written personalized care plan for preventive services. Provider list updated - see scanned questionairre. Reviewed preventative protocols and updated unless pt declined.  Baseline EKG today - NSR rate 60, normal axis, intervals, no acute ST/T changes.      Relevant Orders   EKG 12-Lead (Completed)    Other Visit Diagnoses    Special screening for malignant neoplasms, colon        Relevant Orders    Fecal occult blood, imunochemical        Follow up plan: Return in about 1 year (around 03/02/2016), or as needed, for medicare wellness visit.

## 2015-03-03 NOTE — Assessment & Plan Note (Addendum)
Preventative protocols reviewed and updated unless pt declined. Discussed healthy diet and lifestyle.  

## 2015-03-03 NOTE — Patient Instructions (Addendum)
Pass by lab to pick up stool kit. We will schedule bone density scan. (See Rosaria Ferries on your way out) Advanced directive provided today. Continue seeing Dr Isabell Jarvis. You are doing well today, return as needed or in 1 year for next medicare wellness visit.

## 2015-03-04 ENCOUNTER — Encounter: Payer: Self-pay | Admitting: Family Medicine

## 2015-03-05 ENCOUNTER — Encounter: Payer: Self-pay | Admitting: *Deleted

## 2015-03-05 NOTE — Addendum Note (Signed)
Addended by: [name] on: 03/05/2015 08:20 AM   Modules accepted: Level of Service, SmartSet

## 2015-03-06 ENCOUNTER — Telehealth: Payer: Self-pay

## 2015-03-06 NOTE — Telephone Encounter (Signed)
Pt left v/m; pt was seen on 03/03/15 and pt thinks he left a [employer] with medicare book and medicare information in folder at [facility] when seen on 03/03/15. Pt request cb to see if found in room pt was in when seen 03/03/15.

## 2015-03-06 NOTE — Telephone Encounter (Signed)
Message left advising that his folder/book was not located here in the office.

## 2015-03-11 ENCOUNTER — Other Ambulatory Visit: Payer: Commercial Managed Care - HMO

## 2015-03-11 DIAGNOSIS — Z1211 Encounter for screening for malignant neoplasm of colon: Secondary | ICD-10-CM

## 2015-03-11 LAB — FECAL OCCULT BLOOD, GUAIAC: FECAL OCCULT BLD: NEGATIVE

## 2015-03-11 LAB — FECAL OCCULT BLOOD, IMMUNOCHEMICAL: Fecal Occult Bld: NEGATIVE

## 2015-03-12 ENCOUNTER — Encounter: Payer: Self-pay | Admitting: *Deleted

## 2015-03-31 ENCOUNTER — Ambulatory Visit (INDEPENDENT_AMBULATORY_CARE_PROVIDER_SITE_OTHER)
Admission: RE | Admit: 2015-03-31 | Discharge: 2015-03-31 | Disposition: A | Discharge status: Home / Self Care | Payer: Commercial Managed Care - HMO | Source: Ambulatory Visit | Attending: Family Medicine | Admitting: Family Medicine

## 2015-03-31 DIAGNOSIS — M810 Age-related osteoporosis without current pathological fracture: Secondary | ICD-10-CM | POA: Diagnosis not present

## 2015-04-04 ENCOUNTER — Encounter: Payer: Self-pay | Admitting: Family Medicine

## 2015-04-06 ENCOUNTER — Encounter: Payer: Self-pay | Admitting: *Deleted

## 2015-06-04 DIAGNOSIS — F250 Schizoaffective disorder, bipolar type: Secondary | ICD-10-CM | POA: Diagnosis not present

## 2015-08-27 DIAGNOSIS — F250 Schizoaffective disorder, bipolar type: Secondary | ICD-10-CM | POA: Diagnosis not present

## 2015-12-01 DIAGNOSIS — F250 Schizoaffective disorder, bipolar type: Secondary | ICD-10-CM | POA: Diagnosis not present

## 2016-01-01 DIAGNOSIS — F250 Schizoaffective disorder, bipolar type: Secondary | ICD-10-CM | POA: Diagnosis not present

## 2016-02-28 ENCOUNTER — Other Ambulatory Visit: Payer: Self-pay | Admitting: Family Medicine

## 2016-02-28 DIAGNOSIS — Z1159 Encounter for screening for other viral diseases: Secondary | ICD-10-CM

## 2016-02-28 DIAGNOSIS — F200 Paranoid schizophrenia: Secondary | ICD-10-CM

## 2016-02-28 DIAGNOSIS — N400 Enlarged prostate without lower urinary tract symptoms: Secondary | ICD-10-CM

## 2016-02-28 DIAGNOSIS — Z5181 Encounter for therapeutic drug level monitoring: Secondary | ICD-10-CM

## 2016-02-29 ENCOUNTER — Other Ambulatory Visit (INDEPENDENT_AMBULATORY_CARE_PROVIDER_SITE_OTHER): Payer: Commercial Managed Care - HMO

## 2016-02-29 DIAGNOSIS — Z5181 Encounter for therapeutic drug level monitoring: Secondary | ICD-10-CM | POA: Diagnosis not present

## 2016-02-29 DIAGNOSIS — Z1159 Encounter for screening for other viral diseases: Secondary | ICD-10-CM

## 2016-02-29 DIAGNOSIS — F200 Paranoid schizophrenia: Secondary | ICD-10-CM

## 2016-02-29 DIAGNOSIS — N400 Enlarged prostate without lower urinary tract symptoms: Secondary | ICD-10-CM | POA: Diagnosis not present

## 2016-02-29 LAB — CBC WITH DIFFERENTIAL/PLATELET
BASOS PCT: 0.5 % (ref 0.0–3.0)
Basophils Absolute: 0 10*3/uL (ref 0.0–0.1)
EOS PCT: 2.2 % (ref 0.0–5.0)
Eosinophils Absolute: 0.1 10*3/uL (ref 0.0–0.7)
HEMATOCRIT: 45 % (ref 39.0–52.0)
HEMOGLOBIN: 15.4 g/dL (ref 13.0–17.0)
LYMPHS PCT: 24.4 % (ref 12.0–46.0)
Lymphs Abs: 1 10*3/uL (ref 0.7–4.0)
MCHC: 34.2 g/dL (ref 30.0–36.0)
MCV: 89.1 fl (ref 78.0–100.0)
Monocytes Absolute: 0.5 10*3/uL (ref 0.1–1.0)
Monocytes Relative: 11.1 % (ref 3.0–12.0)
NEUTROS ABS: 2.6 10*3/uL (ref 1.4–7.7)
Neutrophils Relative %: 61.8 % (ref 43.0–77.0)
Platelets: 201 10*3/uL (ref 150.0–400.0)
RBC: 5.05 Mil/uL (ref 4.22–5.81)
RDW: 13.7 % (ref 11.5–15.5)
WBC: 4.2 10*3/uL (ref 4.0–10.5)

## 2016-02-29 LAB — BASIC METABOLIC PANEL
BUN: 9 mg/dL (ref 6–23)
CHLORIDE: 96 meq/L (ref 96–112)
CO2: 30 meq/L (ref 19–32)
Calcium: 9.6 mg/dL (ref 8.4–10.5)
Creatinine, Ser: 0.77 mg/dL (ref 0.40–1.50)
GFR: 108.11 mL/min (ref >60.00)
Glucose, Bld: 105 mg/dL — ABNORMAL HIGH (ref 70–99)
POTASSIUM: 4.5 meq/L (ref 3.5–5.1)
Sodium: 132 mEq/L — ABNORMAL LOW (ref 135–145)

## 2016-02-29 LAB — HEPATIC FUNCTION PANEL
ALBUMIN: 4.6 g/dL (ref 3.5–5.2)
ALT: 17 U/L (ref 0–53)
AST: 12 U/L (ref 0–37)
Alkaline Phosphatase: 68 U/L (ref 39–117)
Bilirubin, Direct: 0.2 mg/dL (ref 0.0–0.3)
TOTAL PROTEIN: 6.8 g/dL (ref 6.0–8.3)
Total Bilirubin: 1.2 mg/dL (ref 0.2–1.2)

## 2016-02-29 LAB — PSA: PSA: 0.53 ng/mL (ref 0.10–4.00)

## 2016-03-01 LAB — VALPROIC ACID LEVEL: VALPROIC ACID LVL: 60.3 ug/mL (ref 50.0–100.0)

## 2016-03-01 LAB — HEPATITIS C ANTIBODY: HCV AB: NEGATIVE

## 2016-03-07 ENCOUNTER — Ambulatory Visit (INDEPENDENT_AMBULATORY_CARE_PROVIDER_SITE_OTHER): Payer: Commercial Managed Care - HMO | Admitting: Family Medicine

## 2016-03-07 ENCOUNTER — Encounter: Payer: Self-pay | Admitting: Family Medicine

## 2016-03-07 VITALS — BP 132/84 | HR 64 | Temp 98.0°F | Ht 72.0 in | Wt 190.8 lb

## 2016-03-07 DIAGNOSIS — N400 Enlarged prostate without lower urinary tract symptoms: Secondary | ICD-10-CM

## 2016-03-07 DIAGNOSIS — Z7189 Other specified counseling: Secondary | ICD-10-CM

## 2016-03-07 DIAGNOSIS — Z7289 Other problems related to lifestyle: Secondary | ICD-10-CM

## 2016-03-07 DIAGNOSIS — M2142 Flat foot [pes planus] (acquired), left foot: Secondary | ICD-10-CM

## 2016-03-07 DIAGNOSIS — M2141 Flat foot [pes planus] (acquired), right foot: Secondary | ICD-10-CM

## 2016-03-07 DIAGNOSIS — Z1211 Encounter for screening for malignant neoplasm of colon: Secondary | ICD-10-CM

## 2016-03-07 DIAGNOSIS — F200 Paranoid schizophrenia: Secondary | ICD-10-CM

## 2016-03-07 DIAGNOSIS — F1090 Alcohol use, unspecified, uncomplicated: Secondary | ICD-10-CM

## 2016-03-07 DIAGNOSIS — M810 Age-related osteoporosis without current pathological fracture: Secondary | ICD-10-CM

## 2016-03-07 DIAGNOSIS — Z0000 Encounter for general adult medical examination without abnormal findings: Secondary | ICD-10-CM | POA: Diagnosis not present

## 2016-03-07 NOTE — Progress Notes (Signed)
Pre visit review using our clinic review tool, if applicable. No additional management support is needed unless otherwise documented below in the visit note. 

## 2016-03-07 NOTE — Assessment & Plan Note (Signed)
Advanced directive: packet provided. Discussed with patient. He doesn't have this set up. Would want Robert Fowler 1/2 sister or Robert Fowler to be HCPOA.

## 2016-03-07 NOTE — Assessment & Plan Note (Signed)

## 2016-03-07 NOTE — Assessment & Plan Note (Signed)
Preventative protocols reviewed and updated unless pt declined. Discussed healthy diet and lifestyle.  

## 2016-03-07 NOTE — Progress Notes (Signed)
BP 132/84 mmHg  Pulse 64  Temp(Src) 98 F (36.7 C) (Oral)  Ht 6' (1.829 m)  Wt 190 lb 12 oz (86.524 kg)  BMI 25.86 kg/m2   CC: medicare wellness visit, initial Subjective:    Patient ID: Robert Fowler, male    DOB: 01/13/1951, 65 y.o.   MRN: [id number]  HPI: Robert Fowler is a 65 y.o. male presenting on 03/07/2016 for Annual Exam   H/o paranoid schizophrenia with depression and is on abilify and prozac and depakote and cogentin. Followed by [facility] Dr Macky Lower. Lithium caused restlessness, haldol caused oversedation.   Habitual drinking - 1-2 drinks/day.  Hearing screen passed today Vision screen passed today No falls in last year  Denies depression/sadness  Preventative: Colon cancer screening - discussed, would like iFOB.  Prostate cancer screening - discussed, would like to have this checked.H/o BPH per chart. Some incomplete emptying.  OSTEOPOROSIS: DEXA -2.8 lumbar (03/2015)  Flu shot yearly at pharmacy  Tdap 10/2013  zostavax - 07/2014  Advanced directive: packet provided. Discussed with patient. He doesn't have this set up. Would want [name] 1/2 sister or [name] to be HCPOA.  Seat belt and sunscreen use discussed. No changing moles on skin.   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 Interim medical history since our last visit reviewed. Allergies and medications reviewed and updated. Current Outpatient Prescriptions on File Prior to Visit  Medication Sig  . acetaminophen (TYLENOL) 500 MG tablet Take 1,000 mg by mouth every 6 (six) hours as needed for pain (pain). Reported on 03/07/2016  . aspirin 81 MG tablet Take 81 mg by mouth daily.  . benztropine (COGENTIN) 1 MG tablet Take 1 mg by mouth at bedtime.   . divalproex (DEPAKOTE  ER) 500 MG 24 hr tablet Take 1,000 mg by mouth at bedtime.   Marland Kitchen FLUoxetine (PROZAC) 20 MG capsule Take 20 mg by mouth daily after breakfast.  . Menthol-Methyl Salicylate (MUSCLE RUB) 10-15 % CREA Apply 1 application topically daily as needed (muscle pain.).  Marland Kitchen Multiple Vitamin (MULTIVITAMIN) tablet Take 1 tablet by mouth daily.   No current facility-administered medications on file prior to visit.    Review of Systems  Constitutional: Negative for fever, chills, activity change, appetite change, fatigue and unexpected weight change.  HENT: Negative for hearing loss.   Eyes: Negative for visual disturbance.  Respiratory: Positive for shortness of breath. Negative for cough, chest tightness and wheezing.   Cardiovascular: Negative for chest pain, palpitations and leg swelling.  Gastrointestinal: Positive for nausea (occasional), diarrhea and blood in stool. Negative for vomiting, abdominal pain, constipation and abdominal distention.  Genitourinary: Negative for hematuria and difficulty urinating.  Musculoskeletal: Negative for myalgias, arthralgias and neck pain.  Skin: Negative for rash.  Neurological: Negative for dizziness, seizures, syncope and headaches.  Hematological: Negative for adenopathy. Does not bruise/bleed easily.  Psychiatric/Behavioral: Negative for dysphoric mood. The patient is not nervous/anxious.    Per HPI unless specifically indicated in ROS section     Objective:    BP 132/84 mmHg  Pulse 64  Temp(Src) 98 F (36.7 C) (Oral)  Ht 6' (1.829 m)  Wt 190 lb 12 oz (86.524 kg)  BMI 25.86 kg/m2  Wt Readings from Last 3 Encounters:  03/07/16 190 lb 12 oz (86.524 kg)  03/03/15 186 lb 8 oz (84.596 kg)  01/28/15 187 lb 8 oz (85.049 kg)    Physical Exam  Constitutional: He is oriented to person, place, and time. He appears well-developed and well-nourished. No distress.  HENT:  Head: Normocephalic and atraumatic.  Right Ear: Hearing, tympanic membrane, external ear  and ear canal normal.  Left Ear: Hearing, tympanic membrane, external ear and ear canal normal.  Nose: Nose normal.  Mouth/Throat: Uvula is midline, oropharynx is clear and moist and mucous membranes are normal. No oropharyngeal exudate, posterior oropharyngeal edema or posterior oropharyngeal erythema.  Eyes: Conjunctivae and EOM are normal. Pupils are equal, round, and reactive to light. No scleral icterus.  Neck: Normal range of motion. Neck supple. Carotid bruit is not present. No thyromegaly present.  Cardiovascular: Normal rate, regular rhythm, normal heart sounds and intact distal pulses.   No murmur heard. Pulses:      Radial pulses are 2+ on the right side, and 2+ on the left side.  Pulmonary/Chest: Effort normal and breath sounds normal. No respiratory distress. He has no wheezes. He has no rales.  Abdominal: Soft. Bowel sounds are normal. He exhibits no distension and no mass. There is no tenderness. There is no rebound and no guarding.  Genitourinary: Rectum normal. Rectal exam shows no external hemorrhoid, no internal hemorrhoid, no fissure, no mass, no tenderness and anal tone normal. Prostate is enlarged (30gm). Prostate is not tender.  Musculoskeletal: Normal range of motion. He exhibits no edema.  Lymphadenopathy:    He has no cervical adenopathy.  Neurological: He is alert and oriented to person, place, and time.  CN grossly intact, station and gait intact Recall 3/3 Calculation 5/5 serial 3s  Skin: Skin is warm and dry. No rash noted.  Psychiatric: He has a normal mood and affect. His behavior is normal. Judgment and thought content normal.  Nursing note and vitals reviewed.  Results for orders placed or performed in visit on 02/29/16  PSA  Result Value Ref Range   PSA 0.53 0.10 - 4.00 ng/mL  Basic metabolic panel  Result Value Ref Range   Sodium 132 (L) 135 - 145 mEq/L   Potassium 4.5 3.5 - 5.1 mEq/L   Chloride 96 96 - 112 mEq/L   CO2 30 19 - 32 mEq/L   Glucose,  Bld 105 (H) 70 - 99 mg/dL   BUN 9 6 - 23 mg/dL   Creatinine, Ser 0.77 0.40 - 1.50 mg/dL   Calcium 9.6 8.4 - 10.5 mg/dL   GFR 108.11 >60.00 mL/min  Valproic acid level  Result Value Ref Range   Valproic Acid Lvl 60.3 50.0 - 100.0 ug/mL  CBC with Differential/Platelet  Result Value Ref Range   WBC 4.2 4.0 - 10.5 K/uL   RBC 5.05 4.22 - 5.81 Mil/uL   Hemoglobin 15.4 13.0 - 17.0 g/dL   HCT 45.0 39.0 - 52.0 %   MCV 89.1 78.0 - 100.0 fl   MCHC 34.2 30.0 - 36.0 g/dL   RDW 13.7 11.5 - 15.5 %   Platelets 201.0 150.0 - 400.0 K/uL   Neutrophils Relative % 61.8 43.0 - 77.0 %   Lymphocytes Relative 24.4 12.0 - 46.0 %   Monocytes Relative 11.1 3.0 - 12.0 %   Eosinophils Relative 2.2 0.0 - 5.0 %   Basophils Relative 0.5 0.0 - 3.0 %   Neutro Abs 2.6 1.4 - 7.7 K/uL   Lymphs Abs 1.0 0.7 - 4.0 K/uL   Monocytes Absolute 0.5 0.1 - 1.0 K/uL  Eosinophils Absolute 0.1 0.0 - 0.7 K/uL   Basophils Absolute 0.0 0.0 - 0.1 K/uL  Hepatic function panel  Result Value Ref Range   Total Bilirubin 1.2 0.2 - 1.2 mg/dL   Bilirubin, Direct 0.2 0.0 - 0.3 mg/dL   Alkaline Phosphatase 68 39 - 117 U/L   AST 12 0 - 37 U/L   ALT 17 0 - 53 U/L   Total Protein 6.8 6.0 - 8.3 g/dL   Albumin 4.6 3.5 - 5.2 g/dL  Hepatitis C antibody  Result Value Ref Range   HCV Ab NEGATIVE NEGATIVE      Assessment & Plan:   Problem List Items Addressed This Visit    Paranoid schizophrenia (HCC)    Chronic, stable. Encouraged continued f/u with psych.       BPH (benign prostatic hypertrophy)    Chronic, stable. Continue to monitor. Enlarged by DRE especially on right      Osteoporosis    Discussed dairy intake and goal calcium in diet. rec add calcium/vit D 1 tablet daily.       Relevant Medications   Calcium Citrate-Vitamin D (CALCIUM CITRATE + D PO)   Pes planus   Health maintenance examination    Preventative protocols reviewed and updated unless pt declined. Discussed healthy diet and lifestyle.       Habitual  alcohol use    Has self decreased alcohol to 1-2 drinks per day.       Medicare annual wellness visit, initial - Primary    I have personally reviewed the Medicare Annual Wellness questionnaire and have noted 1. The patient's medical and social history 2. Their use of alcohol, tobacco or illicit drugs 3. Their current medications and supplements 4. The patient's functional ability including ADL's, fall risks, home safety risks and hearing or visual impairment. Cognitive function has been assessed and addressed as indicated.  5. Diet and physical activity 6. Evidence for depression or mood disorders The patients weight, height, BMI have been recorded in the chart. I have made referrals, counseling and provided education to the patient based on review of the above and I have provided the pt with a written personalized care plan for preventive services. Provider list updated.. See scanned questionairre as needed for further documentation. Reviewed preventative protocols and updated unless pt declined.       Advanced care planning/counseling discussion    Advanced directive: packet provided. Discussed with patient. He doesn't have this set up. Would want [name] 1/2 sister or [name] to be HCPOA.        Other Visit Diagnoses    Special screening for malignant neoplasms, colon        Relevant Orders    Fecal occult blood, imunochemical        Follow up plan: Return in about 1 year (around 03/07/2017), or as needed, for medicare wellness visit.  [name], MD

## 2016-03-07 NOTE — Assessment & Plan Note (Signed)
Discussed dairy intake and goal calcium in diet. rec add calcium/vit D 1 tablet daily.

## 2016-03-07 NOTE — Patient Instructions (Addendum)
Pass by lab to pick up stool kit.  Check at home to make sure your calcium pill has vitamin D in it as well.  Set up living will. Advanced directive packet provided today.  Good to see you today, call us with questions. Return as needed or in 1 year for next medicare wellness visit.   Health Maintenance, Male A healthy lifestyle and preventative care can promote health and wellness.  Maintain regular health, dental, and eye exams.  Eat a healthy diet. Foods like vegetables, fruits, whole grains, low-fat dairy products, and lean protein foods contain the nutrients you need and are low in calories. Decrease your intake of foods high in solid fats, added sugars, and salt. Get information about a proper diet from your health care provider, if necessary.  Regular physical exercise is one of the most important things you can do for your health. Most adults should get at least 150 minutes of moderate-intensity exercise (any activity that increases your heart rate and causes you to sweat) each week. In addition, most adults need muscle-strengthening exercises on 2 or more days a week.   Maintain a healthy weight. The body mass index (BMI) is a screening tool to identify possible weight problems. It provides an estimate of body fat based on height and weight. Your health care provider can find your BMI and can help you achieve or maintain a healthy weight. For males 20 years and older:  A BMI below 18.5 is considered underweight.  A BMI of 18.5 to 24.9 is normal.  A BMI of 25 to 29.9 is considered overweight.  A BMI of 30 and above is considered obese.  Maintain normal blood lipids and cholesterol by exercising and minimizing your intake of saturated fat. Eat a balanced diet with plenty of fruits and vegetables. Blood tests for lipids and cholesterol should begin at age 58 and be repeated every 5 years. If your lipid or cholesterol levels are high, you are over age 42, or you are at high risk for  heart disease, you may need your cholesterol levels checked more frequently.Ongoing high lipid and cholesterol levels should be treated with medicines if diet and exercise are not working.  If you smoke, find out from your health care provider how to quit. If you do not use tobacco, do not start.  Lung cancer screening is recommended for adults aged 55-80 years who are at high risk for developing lung cancer because of a history of smoking. A yearly low-dose CT scan of the lungs is recommended for people who have at least a 30-pack-year history of smoking and are current smokers or have quit within the past 15 years. A pack year of smoking is smoking an average of 1 pack of cigarettes a day for 1 year (for example, a 30-pack-year history of smoking could mean smoking 1 pack a day for 30 years or 2 packs a day for 15 years). Yearly screening should continue until the smoker has stopped smoking for at least 15 years. Yearly screening should be stopped for people who develop a health problem that would prevent them from having lung cancer treatment.  If you choose to drink alcohol, do not have more than 2 drinks per day. One drink is considered to be 12 oz (360 mL) of beer, 5 oz (150 mL) of wine, or 1.5 oz (45 mL) of liquor.  Avoid the use of street drugs. Do not share needles with anyone. Ask for help if you need support or  instructions about stopping the use of drugs.  High blood pressure causes heart disease and increases the risk of stroke. High blood pressure is more likely to develop in:  People who have blood pressure in the end of the normal range (100-139/85-89 mm Hg).  People who are overweight or obese.  People who are African American.  If you are 67-12 years of age, have your blood pressure checked every 3-5 years. If you are 80 years of age or older, have your blood pressure checked every year. You should have your blood pressure measured twice--once when you are at a hospital or  clinic, and once when you are not at a hospital or clinic. Record the average of the two measurements. To check your blood pressure when you are not at a hospital or clinic, you can use:  An automated blood pressure machine at a pharmacy.  A home blood pressure monitor.  If you are 10-43 years old, ask your health care provider if you should take aspirin to prevent heart disease.  Diabetes screening involves taking a blood sample to check your fasting blood sugar level. This should be done once every 3 years after age 41 if you are at a normal weight and without risk factors for diabetes. Testing should be considered at a younger age or be carried out more frequently if you are overweight and have at least 1 risk factor for diabetes.  Colorectal cancer can be detected and often prevented. Most routine colorectal cancer screening begins at the age of 77 and continues through age 30. However, your health care provider may recommend screening at an earlier age if you have risk factors for colon cancer. On a yearly basis, your health care provider may provide home test kits to check for hidden blood in the stool. A small camera at the end of a tube may be used to directly examine the colon (sigmoidoscopy or colonoscopy) to detect the earliest forms of colorectal cancer. Talk to your health care provider about this at age 16 when routine screening begins. A direct exam of the colon should be repeated every 5-10 years through age 51, unless early forms of precancerous polyps or small growths are found.  People who are at an increased risk for hepatitis B should be screened for this virus. You are considered at high risk for hepatitis B if:  You were born in a country where hepatitis B occurs often. Talk with your health care provider about which countries are considered high risk.  Your parents were born in a high-risk country and you have not received a shot to protect against hepatitis B (hepatitis B  vaccine).  You have HIV or AIDS.  You use needles to inject street drugs.  You live with, or have sex with, someone who has hepatitis B.  You are a man who has sex with other men (MSM).  You get hemodialysis treatment.  You take certain medicines for conditions like cancer, organ transplantation, and autoimmune conditions.  Hepatitis C blood testing is recommended for all people born from [birth year] through 1964 and any individual with known risk factors for hepatitis C.  Healthy men should no longer receive prostate-specific antigen (PSA) blood tests as part of routine cancer screening. Talk to your health care provider about prostate cancer screening.  Testicular cancer screening is not recommended for adolescents or adult males who have no symptoms. Screening includes self-exam, a health care provider exam, and other screening tests. Consult with your health care  provider about any symptoms you have or any concerns you have about testicular cancer.  Practice safe sex. Use condoms and avoid high-risk sexual practices to reduce the spread of sexually transmitted infections (STIs).  You should be screened for STIs, including gonorrhea and chlamydia if:  You are sexually active and are younger than 24 years.  You are older than 24 years, and your health care provider tells you that you are at risk for this type of infection.  Your sexual activity has changed since you were last screened, and you are at an increased risk for chlamydia or gonorrhea. Ask your health care provider if you are at risk.  If you are at risk of being infected with HIV, it is recommended that you take a prescription medicine daily to prevent HIV infection. This is called pre-exposure prophylaxis (PrEP). You are considered at risk if:  You are a man who has sex with other men (MSM).  You are a heterosexual man who is sexually active with multiple partners.  You take drugs by injection.  You are sexually active  with a partner who has HIV.  Talk with your health care provider about whether you are at high risk of being infected with HIV. If you choose to begin PrEP, you should first be tested for HIV. You should then be tested every 3 months for as long as you are taking PrEP.  Use sunscreen. Apply sunscreen liberally and repeatedly throughout the day. You should seek shade when your shadow is shorter than you. Protect yourself by wearing long sleeves, pants, a wide-brimmed hat, and sunglasses year round whenever you are outdoors.  Tell your health care provider of new moles or changes in moles, especially if there is a change in shape or color. Also, tell your health care provider if a mole is larger than the size of a pencil eraser.  A one-time screening for abdominal aortic aneurysm (AAA) and surgical repair of large AAAs by ultrasound is recommended for men aged 37-75 years who are current or former smokers.  Stay current with your vaccines (immunizations).   This information is not intended to replace advice given to you by your health care provider. Make sure you discuss any questions you have with your health care provider.   Document Released: 03/10/2008 Document Revised: 10/03/2014 Document Reviewed: 02/07/2011 Elsevier Interactive Patient Education [employer].

## 2016-03-07 NOTE — Assessment & Plan Note (Signed)
Has self decreased alcohol to 1-2 drinks per day.

## 2016-03-07 NOTE — Assessment & Plan Note (Addendum)
Chronic, stable. Continue to monitor. Enlarged by DRE especially on right

## 2016-03-07 NOTE — Assessment & Plan Note (Signed)
Chronic, stable. Encouraged continued f/u with psych.

## 2016-03-08 ENCOUNTER — Other Ambulatory Visit: Payer: Commercial Managed Care - HMO

## 2016-03-18 ENCOUNTER — Other Ambulatory Visit: Payer: Commercial Managed Care - HMO

## 2016-03-18 DIAGNOSIS — Z1211 Encounter for screening for malignant neoplasm of colon: Secondary | ICD-10-CM

## 2016-03-18 LAB — FECAL OCCULT BLOOD, GUAIAC: Fecal Occult Blood: NEGATIVE

## 2016-03-21 ENCOUNTER — Encounter: Payer: Self-pay | Admitting: *Deleted

## 2016-03-21 LAB — FECAL OCCULT BLOOD, IMMUNOCHEMICAL: FECAL OCCULT BLD: NEGATIVE

## 2016-03-22 DIAGNOSIS — F250 Schizoaffective disorder, bipolar type: Secondary | ICD-10-CM | POA: Diagnosis not present

## 2016-06-15 DIAGNOSIS — F250 Schizoaffective disorder, bipolar type: Secondary | ICD-10-CM | POA: Diagnosis not present

## 2016-07-19 DIAGNOSIS — D2312 Other benign neoplasm of skin of left eyelid, including canthus: Secondary | ICD-10-CM | POA: Diagnosis not present

## 2016-07-19 DIAGNOSIS — H2513 Age-related nuclear cataract, bilateral: Secondary | ICD-10-CM | POA: Diagnosis not present

## 2016-07-19 DIAGNOSIS — H5213 Myopia, bilateral: Secondary | ICD-10-CM | POA: Diagnosis not present

## 2016-07-20 ENCOUNTER — Telehealth: Payer: Self-pay

## 2016-07-20 NOTE — Telephone Encounter (Signed)
Message left advising patient.  

## 2016-07-20 NOTE — Telephone Encounter (Signed)
Pt left v/m; pt has cyst beside lt eye and pt has appt with [facility] in Nov 2017 to have cyst removed and pt request permission from Dr Darnell Level to have the cyst removed. Ins will not cover procedure and pt will pay out of pocket. Last annual 03/07/16.Pt request cb.

## 2016-07-20 NOTE — Telephone Encounter (Signed)
Ok to have cyst removed.   

## 2016-08-09 ENCOUNTER — Encounter: Payer: Self-pay | Admitting: Family Medicine

## 2016-08-09 ENCOUNTER — Ambulatory Visit (INDEPENDENT_AMBULATORY_CARE_PROVIDER_SITE_OTHER): Payer: Commercial Managed Care - HMO | Admitting: Family Medicine

## 2016-08-09 VITALS — BP 148/96 | HR 72 | Temp 98.0°F | Wt 192.2 lb

## 2016-08-09 DIAGNOSIS — Z23 Encounter for immunization: Secondary | ICD-10-CM | POA: Diagnosis not present

## 2016-08-09 DIAGNOSIS — F200 Paranoid schizophrenia: Secondary | ICD-10-CM | POA: Diagnosis not present

## 2016-08-09 DIAGNOSIS — M2142 Flat foot [pes planus] (acquired), left foot: Secondary | ICD-10-CM | POA: Diagnosis not present

## 2016-08-09 DIAGNOSIS — M2141 Flat foot [pes planus] (acquired), right foot: Secondary | ICD-10-CM

## 2016-08-09 NOTE — Assessment & Plan Note (Signed)
More restless today on lower depakote dose. He has close f/u with psych next month. Encouraged keep this appointment.

## 2016-08-09 NOTE — Assessment & Plan Note (Signed)
Marked pes planus with loss of longitudinal arch but not really bothering patient. Not notes progression of medial foot collapse. Will refer to sports medicine to discuss possible custom orthotics.

## 2016-08-09 NOTE — Progress Notes (Signed)
Pre visit review using our clinic review tool, if applicable. No additional management support is needed unless otherwise documented below in the visit note. 

## 2016-08-09 NOTE — Patient Instructions (Addendum)
Flu shot today. We will refer you to sports medicine (Dr Lorelei Pont) for flat feet.  Continue cutting down on alcohol.  Keep follow up appointment with psychiatrist.  Good to see you today.  Return as needed or in 6 months for medicare wellness visit.

## 2016-08-09 NOTE — Progress Notes (Signed)
BP (!) 148/96   Pulse 72   Temp 98 F (36.7 C) (Oral)   Wt 192 lb 4 oz (87.2 kg)   BMI 26.07 kg/m    CC: 6 mo f/u visit Subjective:    Patient ID: Robert Fowler, male    DOB: [birth year], 65 y.o.   MRN: [id number]  HPI: Robert Fowler is a 65 y.o. male presenting on 08/09/2016 for Follow-up and Ankle Problem (left)   H/o paranoid schizophrenia with depression and is on abilify and prozac and depakote and cogentin. Followed by [facility] Dr Josph Macho. depakote and cogentin recently decreased. Next appt is next month. Lithium caused restlessness, haldol caused oversedation.   Worsening pes planus noted over the summer now with mild discomfort intermittently noted medial ankles. This does not limit activity at this time.   Habitual drinking - 1-2 glasses of wine/day. Has cut down.   Relevant past medical, surgical, family and social history reviewed and updated as indicated. Interim medical history since our last visit reviewed. Allergies and medications reviewed and updated. Current Outpatient Prescriptions on File Prior to Visit  Medication Sig  . acetaminophen (TYLENOL) 500 MG tablet Take 1,000 mg by mouth every 6 (six) hours as needed for pain (pain). Reported on 03/07/2016  . aspirin 81 MG tablet Take 81 mg by mouth daily.  Marland Kitchen FLUoxetine (PROZAC) 20 MG capsule Take 20 mg by mouth daily after breakfast.  . Multiple Vitamin (MULTIVITAMIN) tablet Take 1 tablet by mouth daily.  . Menthol-Methyl Salicylate (MUSCLE RUB) 10-15 % CREA Apply 1 application topically daily as needed (muscle pain.).   No current facility-administered medications on file prior to visit.     Review of Systems Per HPI unless specifically indicated in ROS section     Objective:    BP (!) 148/96   Pulse 72   Temp 98 F (36.7 C) (Oral)   Wt 192 lb 4 oz (87.2 kg)   BMI 26.07 kg/m   Wt Readings from Last 3 Encounters:  08/09/16 192 lb 4 oz (87.2 kg)  03/07/16 190 lb 12 oz (86.5 kg)  03/03/15 186 lb 8 oz (84.6 kg)      Physical Exam  Constitutional: He appears well-developed. No distress.  HENT:  Mouth/Throat: Oropharynx is clear and moist. No oropharyngeal exudate.  Eyes: Conjunctivae are normal. Pupils are equal, round, and reactive to light.  Neck: Normal range of motion. Neck supple.  Cardiovascular: Normal rate, regular rhythm, normal heart sounds and intact distal pulses.   No murmur heard. Pulmonary/Chest: Effort normal and breath sounds normal. No respiratory distress. He has no wheezes. He has no rales.  Musculoskeletal: He exhibits no edema.  Marked pes planus with collapse of longitudinal arch  Lymphadenopathy:    He has no cervical adenopathy.  Skin: Skin is warm and dry. No rash noted.  Psychiatric: He has a normal mood and affect. His speech is normal. Judgment and thought content normal. He is agitated (slight). Cognition and memory are normal.  Slightly more disorganized today with increased psychomotor restlessness noted than prior. More talkative today.   Nursing note and vitals reviewed.  Results for orders placed or performed in visit on 03/21/16  Fecal Occult Blood, Guaiac  Result Value Ref Range   Fecal Occult Blood Negative       Assessment & Plan:   Problem List Items Addressed This Visit    Paranoid schizophrenia ([place])    More restless today on lower depakote dose. He has close f/u with psych  next month. Encouraged keep this appointment.       Pes planus - Primary    Marked pes planus with loss of longitudinal arch but not really bothering patient. Not notes progression of medial foot collapse. Will refer to sports medicine to discuss possible custom orthotics.        Other Visit Diagnoses    Need for influenza vaccination       Relevant Orders   Flu Vaccine QUAD 36+ mos PF IM (Fluarix & Fluzone Quad PF) (Completed)       Follow up plan: Return in about 6 months (around 02/06/2017) for medicare wellness visit.  [name], MD

## 2016-08-17 ENCOUNTER — Ambulatory Visit: Payer: Commercial Managed Care - HMO | Admitting: Family Medicine

## 2016-08-31 DIAGNOSIS — F250 Schizoaffective disorder, bipolar type: Secondary | ICD-10-CM | POA: Diagnosis not present

## 2016-11-16 DIAGNOSIS — F250 Schizoaffective disorder, bipolar type: Secondary | ICD-10-CM | POA: Diagnosis not present

## 2016-11-16 DIAGNOSIS — F1020 Alcohol dependence, uncomplicated: Secondary | ICD-10-CM | POA: Diagnosis not present

## 2016-11-16 DIAGNOSIS — Z79899 Other long term (current) drug therapy: Secondary | ICD-10-CM | POA: Diagnosis not present

## 2016-12-26 DIAGNOSIS — F250 Schizoaffective disorder, bipolar type: Secondary | ICD-10-CM | POA: Diagnosis not present

## 2017-02-08 DIAGNOSIS — F250 Schizoaffective disorder, bipolar type: Secondary | ICD-10-CM | POA: Diagnosis not present

## 2017-03-08 ENCOUNTER — Other Ambulatory Visit: Payer: Self-pay | Admitting: Family Medicine

## 2017-03-08 ENCOUNTER — Ambulatory Visit (INDEPENDENT_AMBULATORY_CARE_PROVIDER_SITE_OTHER): Payer: Medicare HMO

## 2017-03-08 VITALS — BP 130/94 | HR 63 | Temp 98.2°F | Ht 69.5 in | Wt 188.2 lb

## 2017-03-08 DIAGNOSIS — F200 Paranoid schizophrenia: Secondary | ICD-10-CM

## 2017-03-08 DIAGNOSIS — Z0000 Encounter for general adult medical examination without abnormal findings: Secondary | ICD-10-CM | POA: Diagnosis not present

## 2017-03-08 DIAGNOSIS — Z114 Encounter for screening for human immunodeficiency virus [HIV]: Secondary | ICD-10-CM

## 2017-03-08 DIAGNOSIS — N400 Enlarged prostate without lower urinary tract symptoms: Secondary | ICD-10-CM

## 2017-03-08 DIAGNOSIS — Z23 Encounter for immunization: Secondary | ICD-10-CM

## 2017-03-08 LAB — CBC WITH DIFFERENTIAL/PLATELET
BASOS ABS: 0 10*3/uL (ref 0.0–0.1)
BASOS PCT: 1 % (ref 0.0–3.0)
EOS ABS: 0.1 10*3/uL (ref 0.0–0.7)
Eosinophils Relative: 1.4 % (ref 0.0–5.0)
HCT: 44.6 % (ref 39.0–52.0)
HEMOGLOBIN: 15.5 g/dL (ref 13.0–17.0)
Lymphocytes Relative: 23.3 % (ref 12.0–46.0)
Lymphs Abs: 0.9 10*3/uL (ref 0.7–4.0)
MCHC: 34.8 g/dL (ref 30.0–36.0)
MCV: 87 fl (ref 78.0–100.0)
MONO ABS: 0.4 10*3/uL (ref 0.1–1.0)
Monocytes Relative: 10.6 % (ref 3.0–12.0)
Neutro Abs: 2.6 10*3/uL (ref 1.4–7.7)
Neutrophils Relative %: 63.7 % (ref 43.0–77.0)
Platelets: 206 10*3/uL (ref 150.0–400.0)
RBC: 5.13 Mil/uL (ref 4.22–5.81)
RDW: 14.2 % (ref 11.5–15.5)
WBC: 4 10*3/uL (ref 4.0–10.5)

## 2017-03-08 LAB — COMPREHENSIVE METABOLIC PANEL
ALBUMIN: 4.6 g/dL (ref 3.5–5.2)
ALT: 21 U/L (ref 0–53)
AST: 12 U/L (ref 0–37)
Alkaline Phosphatase: 67 U/L (ref 39–117)
BILIRUBIN TOTAL: 1.1 mg/dL (ref 0.2–1.2)
BUN: 10 mg/dL (ref 6–23)
CALCIUM: 9.9 mg/dL (ref 8.4–10.5)
CO2: 29 mEq/L (ref 19–32)
CREATININE: 0.79 mg/dL (ref 0.40–1.50)
Chloride: 95 mEq/L — ABNORMAL LOW (ref 96–112)
GFR: 104.62 mL/min (ref >60.00)
Glucose, Bld: 98 mg/dL (ref 70–99)
Potassium: 4.5 mEq/L (ref 3.5–5.1)
Sodium: 131 mEq/L — ABNORMAL LOW (ref 135–145)
Total Protein: 7.2 g/dL (ref 6.0–8.3)

## 2017-03-08 LAB — TSH: TSH: 1.3 u[IU]/mL (ref 0.35–4.50)

## 2017-03-08 LAB — PSA: PSA: 0.6 ng/mL (ref 0.10–4.00)

## 2017-03-08 NOTE — Progress Notes (Signed)
Subjective:   Robert Fowler is a 66 y.o. male who presents for Medicare Annual/Subsequent preventive examination.  Review of Systems:  N/A Cardiac Risk Factors include: advanced age (>17men, >86 women);male gender     Objective:    Vitals: BP (!) 130/94 (BP Location: Right Arm, Patient Position: Sitting, Cuff Size: Normal)   Pulse 63   Temp 98.2 F (36.8 C) (Oral)   Ht 5' 9.5" (1.765 m) Comment: no shoes  Wt 188 lb 4 oz (85.4 kg)   SpO2 94%   BMI 27.40 kg/m   Body mass index is 27.4 kg/m.  Tobacco History  Smoking Status  . Former Smoker  . Quit date: 09/26/2006  Smokeless Tobacco  . Never Used     Counseling given: No   Past Medical History:  Diagnosis Date  . Arthritis   . BENIGN PROSTATIC HYPERTROPHY 07/18/2007  . Bipolar disorder ([place])   . Habitual alcohol use   . OSTEOPOROSIS 09/04/2007   DEXA -2.8 lumbar (03/2015)  . Pes planus   . Prediabetes 2014  . Schizophrenia, paranoid ([place])    schizoaffective disorder   Past Surgical History:  Procedure Laterality Date  . INGUINAL HERNIA REPAIR Right 08/2014   Dr Gershon Crane   Family History  Problem Relation Age of Onset  . Alcohol abuse Father   . Alcohol abuse Maternal Grandmother   . COPD Mother        smoker  . CAD Neg Hx   . Stroke Neg Hx   . Cancer Neg Hx   . Diabetes Neg Hx   . Hypertension Neg Hx    History  Sexual Activity  . Sexual activity: Not on file    Outpatient Encounter Prescriptions as of 03/08/2017  Medication Sig  . ARIPiprazole (ABILIFY) 5 MG tablet Take 10 mg by mouth daily.   Marland Kitchen aspirin 81 MG tablet Take 81 mg by mouth daily.  . benztropine (COGENTIN) 0.5 MG tablet Take 0.5 mg by mouth at bedtime.  Marland Kitchen CALCIUM CITRATE PO Take 1 tablet by mouth daily.  . Cholecalciferol (VITAMIN D) 2000 units CAPS Take 1 capsule by mouth daily.  . divalproex (DEPAKOTE ER) 500 MG 24 hr tablet Take 750 mg by mouth at bedtime.   Marland Kitchen FLUoxetine (PROZAC) 20 MG capsule Take 40 mg by mouth daily after  breakfast.   . magnesium oxide (MAG-OX) 400 MG tablet Take 400 mg by mouth daily as needed.  . Melatonin 5 MG TABS Take 1-5 mg by mouth at bedtime as needed.   . Multiple Vitamin (MULTIVITAMIN) tablet Take 1 tablet by mouth daily.  . Omega-3 Fatty Acids (FISH OIL PO) Take 1,200 mg by mouth daily.  Marland Kitchen POTASSIUM PO Take 99 mg by mouth daily.  . [DISCONTINUED] acetaminophen (TYLENOL) 500 MG tablet Take 1,000 mg by mouth every 6 (six) hours as needed for pain (pain). Reported on 03/07/2016  . [DISCONTINUED] Calcium Carbonate (CALCIUM 600 PO) Take 1 tablet by mouth daily.  . [DISCONTINUED] Menthol-Methyl Salicylate (MUSCLE RUB) 10-15 % CREA Apply 1 application topically daily as needed (muscle pain.).  . [DISCONTINUED] Omega-3 Fatty Acids (FISH OIL) 1000 MG CAPS Take 1,000 mg by mouth daily.   No facility-administered encounter medications on file as of 03/08/2017.     Activities of Daily Living In your present state of health, do you have any difficulty performing the following activities: 03/08/2017  Hearing? Y  Vision? Y  Difficulty concentrating or making decisions? Y  Walking or climbing stairs? N  Dressing  or bathing? N  Doing errands, shopping? N  Preparing Food and eating ? N  Using the Toilet? N  In the past six months, have you accidently leaked urine? N  Do you have problems with loss of bowel control? Y  Managing your Medications? N  Managing your Finances? N  Housekeeping or managing your Housekeeping? N  Some recent data might be hidden    Patient Care Team: [name], MD as PCP - General (Family Medicine)   Assessment:     Hearing Screening   125Hz  250Hz  500Hz  1000Hz  2000Hz  3000Hz  4000Hz  6000Hz  8000Hz   Right ear:   40 40 40  40    Left ear:   40 40 40  40    Vision Screening Comments: Last vision exam in October 2017 with Dr. Ellie Lunch   Exercise Activities and Dietary recommendations Current Exercise Habits: The patient does not participate in regular exercise  at present, Exercise limited by: None identified  Goals    . Increase water intake          Starting 03/08/2017, I will continue to drink 6-8 glasses of water daily.       Fall Risk Fall Risk  03/08/2017 03/07/2016 03/03/2015  Falls in the past year? Yes No Yes  Number falls in past yr: 1 - 2 or more  Injury with Fall? No - No  Risk for fall due to : Impaired balance/gait - (No Data)  Risk for fall due to (comments): - - Mis-steps sometimes   Depression Screen PHQ 2/9 Scores 03/08/2017 03/07/2016 03/03/2015  PHQ - 2 Score 0 0 1    Cognitive Function MMSE - Mini Mental State Exam 03/08/2017  Orientation to time 5  Orientation to Place 5  Registration 3  Attention/ Calculation 0  Recall 1  Recall-comments pt was unable to recall 2 of 3 words  Language- name 2 objects 0  Language- repeat 1  Language- follow 3 step command 3  Language- read & follow direction 0  Write a sentence 0  Copy design 0  Total score 18     PLEASE NOTE: A Mini-Cog screen was completed. Maximum score is 20. A value of 0 denotes this part of Folstein MMSE was not completed or the patient failed this part of the Mini-Cog screening.   Mini-Cog Screening Orientation to Time - Max 5 pts Orientation to Place - Max 5 pts Registration - Max 3 pts Recall - Max 3 pts Language Repeat - Max 1 pts Language Follow 3 Step Command - Max 3 pts     Immunization History  Administered Date(s) Administered  . Influenza Whole 07/09/2008, 06/26/2013  . Influenza,inj,Quad PF,36+ Mos 07/30/2014, 08/09/2016  . Pneumococcal Conjugate-13 03/08/2017  . Tdap 10/29/2013  . Zoster 07/30/2014   Screening Tests Health Maintenance  Topic Date Due  . COLON CANCER SCREENING ANNUAL FOBT  03/18/2017  . INFLUENZA VACCINE  04/26/2017  . PNA vac Low Risk Adult (2 of 2 - PPSV23) 03/08/2018  . DTaP/Tdap/Td (2 - Td) 10/30/2023  . TETANUS/TDAP  10/30/2023  . Hepatitis C Screening  Completed  . HIV Screening  Completed      Plan:      I have personally reviewed and addressed the Medicare Annual Wellness questionnaire and have noted the following in the patient's chart:  A. Medical and social history B. Use of alcohol, tobacco or illicit drugs  C. Current medications and supplements D. Functional ability and status E.  Nutritional status F.  Physical  activity G. Advance directives H. List of other physicians I.  Hospitalizations, surgeries, and ER visits in previous 12 months J.  Agar to include hearing, vision, cognitive, depression L. Referrals and appointments - none  In addition, I have reviewed and discussed with patient certain preventive protocols, quality metrics, and best practice recommendations. A written personalized care plan for preventive services as well as general preventive health recommendations were provided to patient.  See attached scanned questionnaire for additional information.   Signed,   Lindell Noe, MHA, BS, LPN Health Coach

## 2017-03-08 NOTE — Progress Notes (Signed)
Pre visit review using our clinic review tool, if applicable. No additional management support is needed unless otherwise documented below in the visit note. 

## 2017-03-08 NOTE — Patient Instructions (Signed)
[name] , Thank you for taking time to come for your Medicare Wellness Visit. I appreciate your ongoing commitment to your health goals. Please review the following plan we discussed and let me know if I can assist you in the future.   These are the goals we discussed: Goals    . Increase water intake          Starting 03/08/2017, I will continue to drink 6-8 glasses of water daily.        This is a list of the screening recommended for you and due dates:  Health Maintenance  Topic Date Due  . Stool Blood Test  03/18/2017  . Flu Shot  04/26/2017  . Pneumonia vaccines (2 of 2 - PPSV23) 03/08/2018  . DTaP/Tdap/Td vaccine (2 - Td) 10/30/2023  . Tetanus Vaccine  10/30/2023  .  Hepatitis C: One time screening is recommended by Center for Disease Control  (CDC) for  adults born from [birth year] through 1964.   Completed  . HIV Screening  Completed   Preventive Care for Adults  A healthy lifestyle and preventive care can promote health and wellness. Preventive health guidelines for adults include the following key practices.  . A routine yearly physical is a good way to check with your health care provider about your health and preventive screening. It is a chance to share any concerns and updates on your health and to receive a thorough exam.  . Visit your dentist for a routine exam and preventive care every 6 months. Brush your teeth twice a day and floss once a day. Good oral hygiene prevents tooth decay and gum disease.  . The frequency of eye exams is based on your age, health, family medical history, use  of contact lenses, and other factors. Follow your health care provider's ecommendations for frequency of eye exams.  . Eat a healthy diet. Foods like vegetables, fruits, whole grains, low-fat dairy products, and lean protein foods contain the nutrients you need without too many calories. Decrease your intake of foods high in solid fats, added sugars, and salt. Eat the right amount of  calories for you. Get information about a proper diet from your health care provider, if necessary.  . Regular physical exercise is one of the most important things you can do for your health. Most adults should get at least 150 minutes of moderate-intensity exercise (any activity that increases your heart rate and causes you to sweat) each week. In addition, most adults need muscle-strengthening exercises on 2 or more days a week.  Silver Sneakers may be a benefit available to you. To determine eligibility, you may visit the website: www.silversneakers.com or contact program at (902) 039-3514 Mon-Fri between 8AM-8PM.   . Maintain a healthy weight. The body mass index (BMI) is a screening tool to identify possible weight problems. It provides an estimate of body fat based on height and weight. Your health care provider can find your BMI and can help you achieve or maintain a healthy weight.   For adults 20 years and older: ? A BMI below 18.5 is considered underweight. ? A BMI of 18.5 to 24.9 is normal. ? A BMI of 25 to 29.9 is considered overweight. ? A BMI of 30 and above is considered obese.   . Maintain normal blood lipids and cholesterol levels by exercising and minimizing your intake of saturated fat. Eat a balanced diet with plenty of fruit and vegetables. Blood tests for lipids and cholesterol should begin at age  20 and be repeated every 5 years. If your lipid or cholesterol levels are high, you are over 50, or you are at high risk for heart disease, you may need your cholesterol levels checked more frequently. Ongoing high lipid and cholesterol levels should be treated with medicines if diet and exercise are not working.  . If you smoke, find out from your health care provider how to quit. If you do not use tobacco, please do not start.  . If you choose to drink alcohol, please do not consume more than 2 drinks per day. One drink is considered to be 12 ounces (355 mL) of beer, 5 ounces  (148 mL) of wine, or 1.5 ounces (44 mL) of liquor.  . If you are 69-54 years old, ask your health care provider if you should take aspirin to prevent strokes.  . Use sunscreen. Apply sunscreen liberally and repeatedly throughout the day. You should seek shade when your shadow is shorter than you. Protect yourself by wearing long sleeves, pants, a wide-brimmed hat, and sunglasses year round, whenever you are outdoors.  . Once a month, do a whole body skin exam, using a mirror to look at the skin on your back. Tell your health care provider of new moles, moles that have irregular borders, moles that are larger than a pencil eraser, or moles that have changed in shape or color.

## 2017-03-08 NOTE — Progress Notes (Signed)
PCP notes:   Health maintenance:  HIV screening - completed FOBT - kit provided to pt PCV13 - administered  Abnormal screenings:   Fall risk - hx of fall without injury or medical treatment Mini-Cog score: 18/20  Patient concerns:   Pt wants to discuss necessity of taking vitamins.   Nurse concerns:  None  Next PCP appt:   03/13/17 @ 1030

## 2017-03-08 NOTE — Progress Notes (Signed)
   Subjective:    Patient ID: Robert Fowler, male    DOB: 1951/01/13, 66 y.o.   MRN: [id number]  HPI I reviewed health advisor's note, was available for consultation, and agree with documentation and plan.    Review of Systems     Objective:   Physical Exam        Assessment & Plan:

## 2017-03-09 LAB — HIV ANTIBODY (ROUTINE TESTING W REFLEX): HIV: NONREACTIVE

## 2017-03-13 ENCOUNTER — Ambulatory Visit (INDEPENDENT_AMBULATORY_CARE_PROVIDER_SITE_OTHER): Payer: Medicare HMO | Admitting: Family Medicine

## 2017-03-13 ENCOUNTER — Encounter: Payer: Self-pay | Admitting: Family Medicine

## 2017-03-13 ENCOUNTER — Other Ambulatory Visit: Payer: Self-pay | Admitting: Family Medicine

## 2017-03-13 ENCOUNTER — Other Ambulatory Visit (INDEPENDENT_AMBULATORY_CARE_PROVIDER_SITE_OTHER): Payer: Medicare HMO

## 2017-03-13 VITALS — BP 130/86 | HR 71 | Temp 97.7°F | Ht 69.5 in | Wt 193.8 lb

## 2017-03-13 DIAGNOSIS — Z0000 Encounter for general adult medical examination without abnormal findings: Secondary | ICD-10-CM | POA: Diagnosis not present

## 2017-03-13 DIAGNOSIS — Z1211 Encounter for screening for malignant neoplasm of colon: Secondary | ICD-10-CM | POA: Diagnosis not present

## 2017-03-13 DIAGNOSIS — Z7289 Other problems related to lifestyle: Secondary | ICD-10-CM

## 2017-03-13 DIAGNOSIS — M2141 Flat foot [pes planus] (acquired), right foot: Secondary | ICD-10-CM

## 2017-03-13 DIAGNOSIS — F1090 Alcohol use, unspecified, uncomplicated: Secondary | ICD-10-CM

## 2017-03-13 DIAGNOSIS — M818 Other osteoporosis without current pathological fracture: Secondary | ICD-10-CM

## 2017-03-13 DIAGNOSIS — M2142 Flat foot [pes planus] (acquired), left foot: Secondary | ICD-10-CM

## 2017-03-13 DIAGNOSIS — F200 Paranoid schizophrenia: Secondary | ICD-10-CM

## 2017-03-13 DIAGNOSIS — Z7189 Other specified counseling: Secondary | ICD-10-CM

## 2017-03-13 DIAGNOSIS — N400 Enlarged prostate without lower urinary tract symptoms: Secondary | ICD-10-CM

## 2017-03-13 DIAGNOSIS — Z0100 Encounter for examination of eyes and vision without abnormal findings: Secondary | ICD-10-CM

## 2017-03-13 LAB — FECAL OCCULT BLOOD, IMMUNOCHEMICAL: Fecal Occult Bld: NEGATIVE

## 2017-03-13 LAB — FECAL OCCULT BLOOD, GUAIAC: Fecal Occult Blood: NEGATIVE

## 2017-03-13 NOTE — Progress Notes (Addendum)
BP 130/86   Pulse 71   Temp 97.7 F (36.5 C)   Ht 5' 9.5" (1.765 m)   Wt 193 lb 12 oz (87.9 kg)   SpO2 97%   BMI 28.20 kg/m    CC: medicare wellness, subsequent Subjective:    Patient ID: Robert Fowler, male    DOB: April 01, 1951, 66 y.o.   MRN: [id number]  HPI: Robert Fowler is a 66 y.o. male presenting on 03/13/2017 for Medicare Wellness (Eye Appt 07/05/17)   Annia Belt last week for medicare wellness visit. Note reviewed.   Requests referral to [facility] ophthalmology.  Feels some restless leg symptoms.   Pes planus - he went to [facility] feet store and bought insoles - he thinks this has helped. Declined podiatry /sports medicine referral.  Preventative: Colon cancer screening - discussed, would like iFOB - he did return today.  Prostate cancer screening - discussed, would like to have this checked.H/o BPH per chart. Some incomplete emptying.  Osteoporosis DEXA -2.8 lumbar (03/2015)  Flu shot yearly at pharmacy  prevnar 02/2017 Tdap 10/2013  zostavax - 07/2014  Shingrix - discussed  Advanced directive: brings packet. Scanned into chart 02/2017. Mother Adonis Huguenin then sister Eustaquio Maize are HCPOA.  Seat belt use discussed.  Sunscreen use discussed. No changing moles on skin.  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 Interim medical history since our last visit reviewed. Allergies and medications reviewed and updated. Outpatient Medications Prior to Visit  Medication Sig Dispense Refill  . aspirin 81 MG tablet Take 81 mg by mouth daily.    . benztropine (COGENTIN) 0.5 MG tablet Take 0.5 mg by mouth at bedtime.    Marland Kitchen CALCIUM CITRATE PO Take 1 tablet by mouth daily.    . Cholecalciferol (VITAMIN D) 2000 units  CAPS Take 1 capsule by mouth daily.    . divalproex (DEPAKOTE ER) 500 MG 24 hr tablet Take 750 mg by mouth at bedtime.     . magnesium oxide (MAG-OX) 400 MG tablet Take 400 mg by mouth daily as needed.    . Melatonin 5 MG TABS Take 1-5 mg by mouth at bedtime as needed.     . Multiple Vitamin (MULTIVITAMIN) tablet Take 1 tablet by mouth daily.    . Omega-3 Fatty Acids (FISH OIL PO) Take 1,200 mg by mouth daily.    . ARIPiprazole (ABILIFY) 5 MG tablet Take 10 mg by mouth daily.     Marland Kitchen FLUoxetine (PROZAC) 20 MG capsule Take 40 mg by mouth daily after breakfast.     . POTASSIUM PO Take 99 mg by mouth daily.     No facility-administered medications prior to visit.      Per HPI unless specifically indicated in ROS section below Review of Systems  Constitutional: Negative for activity change, appetite change, chills, fatigue, fever and unexpected weight change.  HENT: Negative for hearing loss.   Eyes: Negative for visual disturbance.  Respiratory: Positive for cough (mild) and shortness of breath (mild). Negative for chest tightness and wheezing.   Cardiovascular: Negative for chest pain, palpitations and leg swelling.  Gastrointestinal: Negative for abdominal distention, abdominal pain, blood in stool, constipation, diarrhea (occasional), nausea and vomiting.  Genitourinary: Negative for difficulty urinating and hematuria.  Musculoskeletal:  Negative for arthralgias, myalgias and neck pain.  Skin: Negative for rash.  Neurological: Negative for dizziness, seizures, syncope and headaches.  Hematological: Negative for adenopathy. Does not bruise/bleed easily.  Psychiatric/Behavioral: Negative for dysphoric mood. The patient is not nervous/anxious.        Objective:    BP 130/86   Pulse 71   Temp 97.7 F (36.5 C)   Ht 5' 9.5" (1.765 m)   Wt 193 lb 12 oz (87.9 kg)   SpO2 97%   BMI 28.20 kg/m   Wt Readings from Last 3 Encounters:  03/13/17 193 lb 12 oz (87.9 kg)  03/08/17 188 lb 4 oz  (85.4 kg)  08/09/16 192 lb 4 oz (87.2 kg)    Physical Exam  Constitutional: He is oriented to person, place, and time. He appears well-developed and well-nourished. No distress.  HENT:  Head: Normocephalic and atraumatic.  Right Ear: Hearing, tympanic membrane, external ear and ear canal normal.  Left Ear: Hearing, tympanic membrane, external ear and ear canal normal.  Nose: Nose normal.  Mouth/Throat: Uvula is midline, oropharynx is clear and moist and mucous membranes are normal. No oropharyngeal exudate, posterior oropharyngeal edema or posterior oropharyngeal erythema.  Eyes: Conjunctivae and EOM are normal. Pupils are equal, round, and reactive to light. No scleral icterus.  Neck: Normal range of motion. Neck supple. Carotid bruit is not present. No thyromegaly present.  Cardiovascular: Normal rate, regular rhythm, normal heart sounds and intact distal pulses.   No murmur heard. Pulses:      Radial pulses are 2+ on the right side, and 2+ on the left side.  Pulmonary/Chest: Effort normal and breath sounds normal. No respiratory distress. He has no wheezes. He has no rales.  Abdominal: Soft. Bowel sounds are normal. He exhibits no distension and no mass. There is no tenderness. There is no rebound and no guarding.  Genitourinary: Rectum normal. Rectal exam shows no external hemorrhoid, no fissure, no mass, no tenderness and anal tone normal. Prostate is enlarged (50gm). Prostate is not tender.  Musculoskeletal: Normal range of motion. He exhibits no edema.  Lymphadenopathy:    He has no cervical adenopathy.  Neurological: He is alert and oriented to person, place, and time.  CN grossly intact, station and gait intact  Skin: Skin is warm and dry. No rash noted.  Psychiatric: He has a normal mood and affect. His behavior is normal. Judgment and thought content normal.  Nursing note and vitals reviewed.  Results for orders placed or performed in visit on 03/08/17  Comprehensive  metabolic panel  Result Value Ref Range   Sodium 131 (L) 135 - 145 mEq/L   Potassium 4.5 3.5 - 5.1 mEq/L   Chloride 95 (L) 96 - 112 mEq/L   CO2 29 19 - 32 mEq/L   Glucose, Bld 98 70 - 99 mg/dL   BUN 10 6 - 23 mg/dL   Creatinine, Ser 0.79 0.40 - 1.50 mg/dL   Total Bilirubin 1.1 0.2 - 1.2 mg/dL   Alkaline Phosphatase 67 39 - 117 U/L   AST 12 0 - 37 U/L   ALT 21 0 - 53 U/L   Total Protein 7.2 6.0 - 8.3 g/dL   Albumin 4.6 3.5 - 5.2 g/dL   Calcium 9.9 8.4 - 10.5 mg/dL   GFR 104.62 >60.00 mL/min  TSH  Result Value Ref Range   TSH 1.30 0.35 - 4.50 uIU/mL  CBC with Differential/Platelet  Result Value Ref Range   WBC 4.0 4.0 - 10.5  K/uL   RBC 5.13 4.22 - 5.81 Mil/uL   Hemoglobin 15.5 13.0 - 17.0 g/dL   HCT 44.6 39.0 - 52.0 %   MCV 87.0 78.0 - 100.0 fl   MCHC 34.8 30.0 - 36.0 g/dL   RDW 14.2 11.5 - 15.5 %   Platelets 206.0 150.0 - 400.0 K/uL   Neutrophils Relative % 63.7 43.0 - 77.0 %   Lymphocytes Relative 23.3 12.0 - 46.0 %   Monocytes Relative 10.6 3.0 - 12.0 %   Eosinophils Relative 1.4 0.0 - 5.0 %   Basophils Relative 1.0 0.0 - 3.0 %   Neutro Abs 2.6 1.4 - 7.7 K/uL   Lymphs Abs 0.9 0.7 - 4.0 K/uL   Monocytes Absolute 0.4 0.1 - 1.0 K/uL   Eosinophils Absolute 0.1 0.0 - 0.7 K/uL   Basophils Absolute 0.0 0.0 - 0.1 K/uL  PSA  Result Value Ref Range   PSA 0.60 0.10 - 4.00 ng/mL  HIV antibody (with reflex)  Result Value Ref Range   HIV 1&2 Ab, 4th Generation NONREACTIVE NONREACTIVE      Assessment & Plan:   Problem List Items Addressed This Visit    Advanced care planning/counseling discussion    Advanced directive: brings packet. Scanned into chart 02/2017. Mother Adonis Huguenin then sister Eustaquio Maize are HCPOA.       Benign prostatic hyperplasia    Significant enlargement noted on exam, felt symmetrical. Pt denies LUTS       RESOLVED: Habitual alcohol use    He has decreased alcohol - now drinks only intermittently. Will resolve from problem list.       Health maintenance  examination - Primary    Preventative protocols reviewed and updated unless pt declined. Discussed healthy diet and lifestyle.       Osteoporosis    Reviewed calcium/vit D intake. Update DEXA.       Relevant Orders   DG Bone Density   Paranoid schizophrenia (HCC)    Chronic, stable period. Followed by psych.       Pes planus    He did not keep sports med appt last year - instead went to [facility] feet store and bought insoles and orthotics which he feels are helpful. Will continue this.        Other Visit Diagnoses    Eye exam, routine       Relevant Orders   Ambulatory referral to Ophthalmology       Follow up plan: Return in about 1 year (around 03/13/2018) for annual exam, prior fasting for blood work, medicare wellness visit.  [name], MD

## 2017-03-13 NOTE — Patient Instructions (Addendum)
We will place referral for eye doctor. We will set you up for bone density test Check with local pharmacy about cost for new 2 shot shingles series (shingrix).  You are doing well today. Return as needed or in 1 year for next medicare wellness visit with Katha Cabal and then physical with me.  Health Maintenance, Male A healthy lifestyle and preventive care is important for your health and wellness. Ask your health care provider about what schedule of regular examinations is right for you. What should I know about weight and diet? Eat a Healthy Diet  Eat plenty of vegetables, fruits, whole grains, low-fat dairy products, and lean protein.  Do not eat a lot of foods high in solid fats, added sugars, or salt.  Maintain a Healthy Weight Regular exercise can help you achieve or maintain a healthy weight. You should:  Do at least 150 minutes of exercise each week. The exercise should increase your heart rate and make you sweat (moderate-intensity exercise).  Do strength-training exercises at least twice a week.  Watch Your Levels of Cholesterol and Blood Lipids  Have your blood tested for lipids and cholesterol every 5 years starting at 65 years of age. If you are at high risk for heart disease, you should start having your blood tested when you are 65 years old. You may need to have your cholesterol levels checked more often if: ? Your lipid or cholesterol levels are high. ? You are older than 65 years of age. ? You are at high risk for heart disease.  What should I know about cancer screening? Many types of cancers can be detected early and may often be prevented. Lung Cancer  You should be screened every year for lung cancer if: ? You are a current smoker who has smoked for at least 30 years. ? You are a former smoker who has quit within the past 15 years.  Talk to your health care provider about your screening options, when you should start screening, and how often you should be  screened.  Colorectal Cancer  Routine colorectal cancer screening usually begins at 65 years of age and should be repeated every 5-10 years until you are 65 years old. You may need to be screened more often if early forms of precancerous polyps or small growths are found. Your health care provider may recommend screening at an earlier age if you have risk factors for colon cancer.  Your health care provider may recommend using home test kits to check for hidden blood in the stool.  A small camera at the end of a tube can be used to examine your colon (sigmoidoscopy or colonoscopy). This checks for the earliest forms of colorectal cancer.  Prostate and Testicular Cancer  Depending on your age and overall health, your health care provider may do certain tests to screen for prostate and testicular cancer.  Talk to your health care provider about any symptoms or concerns you have about testicular or prostate cancer.  Skin Cancer  Check your skin from head to toe regularly.  Tell your health care provider about any new moles or changes in moles, especially if: ? There is a change in a mole's size, shape, or color. ? You have a mole that is larger than a pencil eraser.  Always use sunscreen. Apply sunscreen liberally and repeat throughout the day.  Protect yourself by wearing long sleeves, pants, a wide-brimmed hat, and sunglasses when outside.  What should I know about heart disease, diabetes,  and high blood pressure?  If you are 76-42 years of age, have your blood pressure checked every 3-5 years. If you are 50 years of age or older, have your blood pressure checked every year. You should have your blood pressure measured twice-once when you are at a hospital or clinic, and once when you are not at a hospital or clinic. Record the average of the two measurements. To check your blood pressure when you are not at a hospital or clinic, you can use: ? An automated blood pressure machine at a  pharmacy. ? A home blood pressure monitor.  Talk to your health care provider about your target blood pressure.  If you are between 56-30 years old, ask your health care provider if you should take aspirin to prevent heart disease.  Have regular diabetes screenings by checking your fasting blood sugar level. ? If you are at a normal weight and have a low risk for diabetes, have this test once every three years after the age of 40. ? If you are overweight and have a high risk for diabetes, consider being tested at a younger age or more often.  A one-time screening for abdominal aortic aneurysm (AAA) by ultrasound is recommended for men aged 14-75 years who are current or former smokers. What should I know about preventing infection? Hepatitis B If you have a higher risk for hepatitis B, you should be screened for this virus. Talk with your health care provider to find out if you are at risk for hepatitis B infection. Hepatitis C Blood testing is recommended for:  Everyone born from [birth year] through 1964.  Anyone with known risk factors for hepatitis C.  Sexually Transmitted Diseases (STDs)  You should be screened each year for STDs including gonorrhea and chlamydia if: ? You are sexually active and are younger than 65 years of age. ? You are older than 65 years of age and your health care provider tells you that you are at risk for this type of infection. ? Your sexual activity has changed since you were last screened and you are at an increased risk for chlamydia or gonorrhea. Ask your health care provider if you are at risk.  Talk with your health care provider about whether you are at high risk of being infected with HIV. Your health care provider may recommend a prescription medicine to help prevent HIV infection.  What else can I do?  Schedule regular health, dental, and eye exams.  Stay current with your vaccines (immunizations).  Do not use any tobacco products, such as  cigarettes, chewing tobacco, and e-cigarettes. If you need help quitting, ask your health care provider.  Limit alcohol intake to no more than 2 drinks per day. One drink equals 12 ounces of beer, 5 ounces of wine, or 1 ounces of hard liquor.  Do not use street drugs.  Do not share needles.  Ask your health care provider for help if you need support or information about quitting drugs.  Tell your health care provider if you often feel depressed.  Tell your health care provider if you have ever been abused or do not feel safe at home. This information is not intended to replace advice given to you by your health care provider. Make sure you discuss any questions you have with your health care provider. Document Released: 03/10/2008 Document Revised: 05/11/2016 Document Reviewed: 06/16/2015 Elsevier Interactive Patient Education  [employer].

## 2017-03-13 NOTE — Assessment & Plan Note (Signed)
He has decreased alcohol - now drinks only intermittently. Will resolve from problem list.

## 2017-03-13 NOTE — Assessment & Plan Note (Signed)
Preventative protocols reviewed and updated unless pt declined. Discussed healthy diet and lifestyle.  

## 2017-03-13 NOTE — Assessment & Plan Note (Signed)
He did not keep sports med appt last year - instead went to [facility] and bought insoles and orthotics which he feels are helpful. Will continue this.

## 2017-03-13 NOTE — Assessment & Plan Note (Signed)
Reviewed calcium/vit D intake. Update DEXA.

## 2017-03-13 NOTE — Assessment & Plan Note (Signed)
Significant enlargement noted on exam, felt symmetrical. Pt denies LUTS

## 2017-03-13 NOTE — Assessment & Plan Note (Signed)
Chronic, stable period. Followed by psych.

## 2017-03-13 NOTE — Assessment & Plan Note (Addendum)
Advanced directive: brings packet. Scanned into chart 02/2017. Mother [name] then sister [name] are HCPOA.  

## 2017-03-14 ENCOUNTER — Encounter: Payer: Self-pay | Admitting: *Deleted

## 2017-04-03 ENCOUNTER — Ambulatory Visit (INDEPENDENT_AMBULATORY_CARE_PROVIDER_SITE_OTHER)
Admission: RE | Admit: 2017-04-03 | Discharge: 2017-04-03 | Disposition: A | Discharge status: Home / Self Care | Payer: Medicare HMO | Source: Ambulatory Visit | Attending: Family Medicine | Admitting: Family Medicine

## 2017-04-03 DIAGNOSIS — M818 Other osteoporosis without current pathological fracture: Secondary | ICD-10-CM

## 2017-04-10 ENCOUNTER — Other Ambulatory Visit: Payer: Self-pay | Admitting: Family Medicine

## 2017-04-10 ENCOUNTER — Telehealth: Payer: Self-pay | Admitting: Family Medicine

## 2017-04-10 MED ORDER — ALENDRONATE SODIUM 70 MG PO TABS: 70.0000 mg | ORAL_TABLET | ORAL | 11 refills | Status: DC

## 2017-04-10 NOTE — Telephone Encounter (Signed)
Patient Name: Robert Fowler  DOB: May 20, 1951    Initial Comment Caller states her son is experiencing sudden paranoia and anxiety.   Nurse Assessment  Nurse: Genoveva Ill, RN, Lattie Haw Date/Time (Eastern Time): 04/10/2017 5:14:42 PM  Confirm and document reason for call. If symptomatic, describe symptoms. ---Caller states her son for the past 3 days has had change in behavior; was watching the news this am, then started preaching with paranoia, not sleeping and anxiety; hasn't seen him like this since he was teenager and is bipolar or schizophrenic and not sure  Does the patient have any new or worsening symptoms? ---Yes  Will a triage be completed? ---No  Select reason for no triage. ---Other  Please document clinical information provided and list any resource used. ---not with pt for triage and states she hasn't been with him all day; advised that it sounds as if he needs to come to ER, but to call back when with son, as 911/police may be necessary; understanding verb     Guidelines    Guideline Title Affirmed Question Affirmed Notes       Final Disposition User   Clinical Call Burress, RN, Lattie Haw    Comments  info per RN knowledge

## 2017-04-11 ENCOUNTER — Inpatient Hospital Stay (HOSPITAL_COMMUNITY)
Admission: AD | Admit: 2017-04-11 | Discharge: 2017-04-19 | DRG: 885 | Disposition: A | Discharge status: Home / Self Care | Payer: Medicare HMO | Source: Intra-hospital | Attending: Psychiatry | Admitting: Psychiatry

## 2017-04-11 ENCOUNTER — Encounter (HOSPITAL_COMMUNITY): Payer: Self-pay | Admitting: Emergency Medicine

## 2017-04-11 ENCOUNTER — Emergency Department (HOSPITAL_COMMUNITY)
Admission: EM | Admit: 2017-04-11 | Discharge: 2017-04-11 | Disposition: A | Discharge status: Other Acute Inpatient Hospital | Payer: Medicare HMO | Attending: Emergency Medicine | Admitting: Emergency Medicine

## 2017-04-11 ENCOUNTER — Encounter (HOSPITAL_COMMUNITY): Payer: Self-pay

## 2017-04-11 DIAGNOSIS — R443 Hallucinations, unspecified: Secondary | ICD-10-CM | POA: Diagnosis not present

## 2017-04-11 DIAGNOSIS — E871 Hypo-osmolality and hyponatremia: Secondary | ICD-10-CM | POA: Diagnosis not present

## 2017-04-11 DIAGNOSIS — F39 Unspecified mood [affective] disorder: Secondary | ICD-10-CM | POA: Diagnosis not present

## 2017-04-11 DIAGNOSIS — M810 Age-related osteoporosis without current pathological fracture: Secondary | ICD-10-CM | POA: Diagnosis not present

## 2017-04-11 DIAGNOSIS — F319 Bipolar disorder, unspecified: Principal | ICD-10-CM | POA: Diagnosis present

## 2017-04-11 DIAGNOSIS — Z7982 Long term (current) use of aspirin: Secondary | ICD-10-CM | POA: Insufficient documentation

## 2017-04-11 DIAGNOSIS — R7303 Prediabetes: Secondary | ICD-10-CM | POA: Diagnosis not present

## 2017-04-11 DIAGNOSIS — F1290 Cannabis use, unspecified, uncomplicated: Secondary | ICD-10-CM | POA: Diagnosis not present

## 2017-04-11 DIAGNOSIS — R631 Polydipsia: Secondary | ICD-10-CM | POA: Diagnosis present

## 2017-04-11 DIAGNOSIS — Z79899 Other long term (current) drug therapy: Secondary | ICD-10-CM | POA: Insufficient documentation

## 2017-04-11 DIAGNOSIS — F209 Schizophrenia, unspecified: Secondary | ICD-10-CM | POA: Diagnosis present

## 2017-04-11 DIAGNOSIS — N400 Enlarged prostate without lower urinary tract symptoms: Secondary | ICD-10-CM | POA: Diagnosis not present

## 2017-04-11 DIAGNOSIS — Z825 Family history of asthma and other chronic lower respiratory diseases: Secondary | ICD-10-CM | POA: Diagnosis not present

## 2017-04-11 DIAGNOSIS — F200 Paranoid schizophrenia: Secondary | ICD-10-CM | POA: Insufficient documentation

## 2017-04-11 DIAGNOSIS — Z87891 Personal history of nicotine dependence: Secondary | ICD-10-CM | POA: Diagnosis not present

## 2017-04-11 DIAGNOSIS — Z811 Family history of alcohol abuse and dependence: Secondary | ICD-10-CM | POA: Diagnosis not present

## 2017-04-11 DIAGNOSIS — F419 Anxiety disorder, unspecified: Secondary | ICD-10-CM | POA: Diagnosis not present

## 2017-04-11 LAB — COMPREHENSIVE METABOLIC PANEL
ALT: 24 U/L (ref 17–63)
AST: 20 U/L (ref 15–41)
Albumin: 4.6 g/dL (ref 3.5–5.0)
Alkaline Phosphatase: 73 U/L (ref 38–126)
Anion gap: 11 (ref 5–15)
BILIRUBIN TOTAL: 2 mg/dL — AB (ref 0.3–1.2)
CHLORIDE: 89 mmol/L — AB (ref 101–111)
CO2: 22 mmol/L (ref 22–32)
CREATININE: 0.57 mg/dL — AB (ref 0.61–1.24)
Calcium: 9.5 mg/dL (ref 8.9–10.3)
Glucose, Bld: 116 mg/dL — ABNORMAL HIGH (ref 65–99)
Potassium: 3.8 mmol/L (ref 3.5–5.1)
Sodium: 122 mmol/L — ABNORMAL LOW (ref 135–145)
TOTAL PROTEIN: 7.9 g/dL (ref 6.5–8.1)

## 2017-04-11 LAB — RAPID URINE DRUG SCREEN, HOSP PERFORMED
Amphetamines: NOT DETECTED
Barbiturates: NOT DETECTED
Benzodiazepines: NOT DETECTED
Cocaine: NOT DETECTED
OPIATES: NOT DETECTED
Tetrahydrocannabinol: NOT DETECTED

## 2017-04-11 LAB — CBC
HCT: 42 % (ref 39.0–52.0)
Hemoglobin: 15.9 g/dL (ref 13.0–17.0)
MCH: 30.6 pg (ref 26.0–34.0)
MCHC: 37.9 g/dL — AB (ref 30.0–36.0)
MCV: 80.9 fL (ref 78.0–100.0)
PLATELETS: 249 10*3/uL (ref 150–400)
RBC: 5.19 MIL/uL (ref 4.22–5.81)
RDW: 13.4 % (ref 11.5–15.5)
WBC: 6.4 10*3/uL (ref 4.0–10.5)

## 2017-04-11 LAB — SALICYLATE LEVEL

## 2017-04-11 LAB — ACETAMINOPHEN LEVEL: Acetaminophen (Tylenol), Serum: <10 ug/mL — ABNORMAL LOW (ref 10–30)

## 2017-04-11 LAB — ETHANOL

## 2017-04-11 MED ORDER — ASPIRIN 81 MG PO CHEW
81.0000 mg | CHEWABLE_TABLET | Freq: Every day | ORAL | Status: DC
  Administered 2017-04-11 – 2017-04-19 (×9): 81 mg via ORAL
  Filled 2017-04-11 (×13): qty 1

## 2017-04-11 MED ORDER — DIVALPROEX SODIUM ER 500 MG PO TB24
750.0000 mg | ORAL_TABLET | Freq: Every day | ORAL | Status: DC
  Administered 2017-04-11: 750 mg via ORAL
  Filled 2017-04-11 (×4): qty 1

## 2017-04-11 MED ORDER — VITAMIN D 1000 UNITS PO TABS
2000.0000 [IU] | ORAL_TABLET | Freq: Every day | ORAL | Status: DC
  Administered 2017-04-11 – 2017-04-19 (×9): 2000 [IU] via ORAL
  Filled 2017-04-11 (×13): qty 2

## 2017-04-11 MED ORDER — ARIPIPRAZOLE 10 MG PO TABS
10.0000 mg | ORAL_TABLET | Freq: Every day | ORAL | Status: DC
  Administered 2017-04-11 – 2017-04-12 (×2): 10 mg via ORAL
  Filled 2017-04-11 (×5): qty 1

## 2017-04-11 MED ORDER — HYDRALAZINE HCL 25 MG PO TABS
25.0000 mg | ORAL_TABLET | Freq: Once | ORAL | Status: AC
  Administered 2017-04-11: 25 mg via ORAL
  Filled 2017-04-11: qty 1

## 2017-04-11 MED ORDER — ALUM & MAG HYDROXIDE-SIMETH 200-200-20 MG/5ML PO SUSP: 30.0000 mL | ORAL | Status: DC | PRN

## 2017-04-11 MED ORDER — TAMSULOSIN HCL 0.4 MG PO CAPS
0.4000 mg | ORAL_CAPSULE | Freq: Every day | ORAL | Status: DC
  Administered 2017-04-11 – 2017-04-19 (×9): 0.4 mg via ORAL
  Filled 2017-04-11 (×13): qty 1

## 2017-04-11 MED ORDER — TRAZODONE HCL 50 MG PO TABS: 50.0000 mg | ORAL_TABLET | Freq: Every evening | ORAL | Status: DC | PRN

## 2017-04-11 MED ORDER — HYDROXYZINE HCL 25 MG PO TABS
25.0000 mg | ORAL_TABLET | Freq: Three times a day (TID) | ORAL | Status: DC | PRN
  Administered 2017-04-11: 25 mg via ORAL
  Filled 2017-04-11 (×2): qty 1

## 2017-04-11 MED ORDER — BENZTROPINE MESYLATE 0.5 MG PO TABS
0.5000 mg | ORAL_TABLET | Freq: Every day | ORAL | Status: DC
  Administered 2017-04-11: 0.5 mg via ORAL
  Filled 2017-04-11 (×3): qty 1

## 2017-04-11 MED ORDER — FLUOXETINE HCL 20 MG PO CAPS
40.0000 mg | ORAL_CAPSULE | Freq: Every day | ORAL | Status: DC
  Administered 2017-04-11 – 2017-04-12 (×2): 40 mg via ORAL
  Filled 2017-04-11 (×5): qty 2

## 2017-04-11 MED ORDER — MAGNESIUM HYDROXIDE 400 MG/5ML PO SUSP: 30.0000 mL | Freq: Every day | ORAL | Status: DC | PRN

## 2017-04-11 MED ORDER — ACETAMINOPHEN 325 MG PO TABS
650.0000 mg | ORAL_TABLET | Freq: Four times a day (QID) | ORAL | Status: DC | PRN
  Administered 2017-04-12 – 2017-04-18 (×2): 650 mg via ORAL
  Filled 2017-04-11 (×2): qty 2

## 2017-04-11 MED ORDER — DIAZEPAM 5 MG PO TABS
5.0000 mg | ORAL_TABLET | Freq: Once | ORAL | Status: AC
  Administered 2017-04-11: 5 mg via ORAL
  Filled 2017-04-11: qty 1

## 2017-04-11 NOTE — Tx Team (Signed)
Initial Treatment Plan 04/11/2017 5:20 PM Robert Fowler [id number]    PATIENT STRESSORS: Financial difficulties Occupational concerns Substance abuse   PATIENT STRENGTHS: Curator fund of knowledge Motivation for treatment/growth Physical Health Supportive family/friends   PATIENT IDENTIFIED PROBLEMS: Paranoia  Psychosis  "Color more"  "Sleep better"               DISCHARGE CRITERIA:  Improved stabilization in mood, thinking, and/or behavior Verbal commitment to aftercare and medication compliance  PRELIMINARY DISCHARGE PLAN: Outpatient therapy Medication management  PATIENT/FAMILY INVOLVEMENT: This treatment plan has been presented to and reviewed with the patient, Robert Fowler.  The patient and family have been given the opportunity to ask questions and make suggestions.  Windell Moment, RN 04/11/2017, 5:20 PM

## 2017-04-11 NOTE — Progress Notes (Signed)
Robert Fowler is a 66 year old male being admitted voluntarily to [facility] as a walk in to [facility].  He was medically cleared at [facility].  He came in accompanied by his mother for delusional statements and mania.  He denied SI/HI or A/V hallucinations.  He does however believe that the world is ending and heard about this prophecy on the TV.  He is currently being seen at [facility] and reported that he takes his medications as prescribed.  Recent stressor was finding out that he still owes more money on his house for interest, which has led to poor sleep.  He is diagnosed with Bipolar I Disorder, most recent episode manic.  He was very hyper-verbal during [facility] admission.  He denies SI/HI or A/V hallucinations.  He denies any pain or discomfort and appears to be in no physical distress.  Oriented him to the unit.  Admission paperwork completed and signed.  Belongings searched and secured in locker # 72.  Skin assessment completed and noted scratch on right mid arm with no other skin issues noted.  Q 15 minute checks initiated for safety.  We will monitor the progress towards his goals.
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
## 2017-04-11 NOTE — ED Notes (Signed)
Per Joselyn Arrow at [facility] is a walk in and needs medical clearance-states patient is manic and obsessing about politics etc-can go back to [facility] once he has a bed-appropriate for [facility] once medically cleared
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
## 2017-04-11 NOTE — H&P (Signed)
Behavioral Health Medical Screening Exam  Robert Fowler is an 66 y.o. male who arrived voluntarily to [facility] accompanied by his mother for c/o symptoms of worsening anxiety and depression with manic episodes of psychosis.  Total Time spent with patient: 30 minutes  Psychiatric Specialty Exam: Physical Exam  Constitutional: He is oriented to person, place, and time. He appears well-developed and well-nourished.  HENT:  Head: Normocephalic.  Eyes: Pupils are equal, round, and reactive to light.  Neck: Normal range of motion. Neck supple.  Cardiovascular: Normal rate and regular rhythm.   Respiratory: Effort normal and breath sounds normal.  GI: Soft. Bowel sounds are normal.  Genitourinary:  Genitourinary Comments: Deferred  Musculoskeletal: Normal range of motion.  Neurological: He is alert and oriented to person, place, and time.  Skin: Skin is warm and dry.    Review of Systems  Psychiatric/Behavioral: Positive for depression. Negative for hallucinations, memory loss, substance abuse and suicidal ideas. The patient is nervous/anxious. The patient does not have insomnia.   All other systems reviewed and are negative.   Blood pressure (!) 169/103, pulse 88, temperature 99.3 F (37.4 C), temperature source Oral, resp. rate 20, SpO2 100 %.There is no height or weight on file to calculate BMI.  General Appearance: Casual  Eye Contact:  Good  Speech:  Clear and Coherent and Pressured  Volume:  Normal  Mood:  Anxious  Affect:  Congruent  Thought Process:  Coherent, Goal Directed and Descriptions of Associations: Circumstantial  Orientation:  Full (Time, Place, and Person)  Thought Content:  Paranoid Ideation  Suicidal Thoughts:  No  Homicidal Thoughts:  No  Memory:  Immediate;   Good Recent;   Fair Remote;   Fair  Judgement:  Intact  Insight:  Present  Psychomotor Activity:  Increased  Concentration: Concentration: Good and Attention Span: Good  Recall:  Good  Fund of  Knowledge:Good  Language: Good  Akathisia:  Negative  Handed:  Right  AIMS (if indicated):     Assets:  Communication Skills Desire for Improvement Financial Resources/Insurance Housing Leisure Time Physical Health Social Support  Sleep:       Musculoskeletal: Strength & Muscle Tone: within normal limits Gait & Station: normal Patient leans: N/A  Blood pressure (!) 169/103, pulse 88, temperature 99.3 F (37.4 C), temperature source Oral, resp. rate 20, SpO2 100 %.  Recommendations:   Based on my evaluation the patient appears to have an emergency medical condition for which I recommend the patient be transferred to the emergency department for further evaluation.  Vicenta Aly, NP 04/11/2017, 9:33 AM

## 2017-04-11 NOTE — ED Notes (Signed)
Bed: WA29 Expected date:  Expected time:  Means of arrival:  Comments: EMS-behavioral issues

## 2017-04-11 NOTE — BH Assessment (Signed)
[facility] Assessment Progress Note  Per Zerita Boers, NP, this pt requires psychiatric hospitalization at this time.  Leonia Reader, RN, [facility] has pre-assigned pt to [facility] Rm [id number]; she will call when they are ready to receive pt.  Pt has signed Voluntary Admission and Consent for Treatment, as well as Consent to Release Information to his mother and to [place], and a notification call has been placed to the latter.  Signed forms have been faxed to [facility].  Pt's nurse has been notified, and agrees to send original paperwork along with pt via [facility], and to call report to [phone] when the time comes.  Jalene Mullet, [place] Triage Specialist [phone]

## 2017-04-11 NOTE — Progress Notes (Signed)
Adult Psychoeducational Group Note  Date:  04/11/2017 Time:  8:58 PM  Group Topic/Focus:  Wrap-Up Group:   The focus of this group is to help patients review their daily goal of treatment and discuss progress on daily workbooks.  Participation Level:  Active  Participation Quality:  Appropriate  Affect:  Appropriate  Cognitive:  Alert  Insight: Appropriate  Engagement in Group:  Engaged  Modes of Intervention:  Discussion  Additional Comments:  Patient stated his day was better than yesterday.   Amin Fornwalt L Cristo Ausburn 04/11/2017, 8:58 PM

## 2017-04-11 NOTE — Telephone Encounter (Signed)
Unable to reach pts mother or pt by any contact #. Pt has appt with behavorial health 04/11/17 per chart review tab and I called [phone] at [place] and I spoke with Ragan, pt is at behavorial health now being evaluated. FYI to Dr Darnell Level.

## 2017-04-11 NOTE — BH Assessment (Signed)
TTS ordered by EDP (Dr. Lacinda Axon) for this patient. Writer notified Dr. Lacinda Axon that patient's TTS was completed at [facility] already. Dr. Lacinda Axon agreed to remove the TTS order.

## 2017-04-11 NOTE — ED Provider Notes (Signed)
[facility] DEPT Provider Note   CSN: [id number] Arrival date & time: 04/11/17  1008     History   Chief Complaint Chief Complaint  Patient presents with  . Insomnia  . Medical Clearance    HPI Robert Fowler is a 66 y.o. male.  Level V caveat for psychiatric disorder. History obtained from patient and his mother. According to the mother, patient was diagnosed with schizophrenia as a teenager. However he has been able to hold a job over the years. Yesterday he was upset over the Trump/Putin interviews on TV;  and mother reports he went "berserk", thought incessantly about politics.  ROS: not sleeping, manic behavior      Past Medical History:  Diagnosis Date  . Arthritis   . BENIGN PROSTATIC HYPERTROPHY 07/18/2007  . Bipolar disorder ([place])   . Habitual alcohol use   . OSTEOPOROSIS 09/04/2007   DEXA -2.8 lumbar (03/2015)  . Pes planus   . Prediabetes 2014  . Schizophrenia, paranoid ([place])    schizoaffective disorder    Patient Active Problem List   Diagnosis Date Noted  . Medicare annual wellness visit, initial 03/03/2015  . Advanced care planning/counseling discussion 03/03/2015  . Health maintenance examination 01/27/2014  . Pes planus 01/11/2008  . Osteoporosis 09/04/2007  . Ex-smoker 09/04/2007  . Paranoid schizophrenia ([place]) 07/18/2007  . Hematuria 07/18/2007  . Benign prostatic hyperplasia 07/18/2007    Past Surgical History:  Procedure Laterality Date  . INGUINAL HERNIA REPAIR Right 08/2014   Dr Gershon Crane       Home Medications    Prior to Admission medications   Medication Sig Start Date End Date Taking? Authorizing Provider  acetaminophen (TYLENOL) 500 MG tablet Take 1,000 mg by mouth every 6 (six) hours as needed for moderate pain or headache.   Yes [provider]  ARIPiprazole (ABILIFY) 10 MG tablet Take 10 mg by mouth daily.   Yes [provider]  aspirin 81 MG tablet Take 81 mg by mouth daily.   Yes [provider]    benztropine (COGENTIN) 0.5 MG tablet Take 0.5 mg by mouth at bedtime.   Yes [provider]  CALCIUM CITRATE PO Take 1 tablet by mouth daily.   Yes [provider]  Cholecalciferol (VITAMIN D) 2000 units CAPS Take 1 capsule by mouth daily.   Yes [provider]  divalproex (DEPAKOTE ER) 250 MG 24 hr tablet Take 250 mg by mouth at bedtime. Takes with 500mg  for a total dose of 750mg  03/20/17  Yes [provider]  divalproex (DEPAKOTE ER) 500 MG 24 hr tablet Take 750 mg by mouth at bedtime.    Yes [provider]  FLUoxetine (PROZAC) 40 MG capsule Take 40 mg by mouth daily.   Yes [provider]  magnesium oxide (MAG-OX) 400 MG tablet Take 400 mg by mouth daily as needed.   Yes [provider]  Melatonin 5 MG TABS Take 1-5 mg by mouth at bedtime as needed.    Yes [provider]  Multiple Vitamin (MULTIVITAMIN) tablet Take 1 tablet by mouth daily.   Yes [provider]  neomycin-polymyxin-pramoxine (NEOSPORIN PLUS) 1 % cream Apply 1 application topically 2 (two) times daily as needed (minor cuts/wounds).   Yes [provider]  Omega-3 Fatty Acids (FISH OIL PO) Take 1,200 mg by mouth daily.   Yes [provider]  alendronate (FOSAMAX) 70 MG tablet Take 1 tablet (70 mg total) by mouth every 7 (seven) days. Take with a full  glass of water on an empty stomach. 04/10/17   [name], MD    Family History Family History  Problem Relation Age of Onset  . Alcohol abuse Father   . Alcohol abuse Maternal Grandmother   . COPD Mother        smoker  . CAD Neg Hx   . Stroke Neg Hx   . Cancer Neg Hx   . Diabetes Neg Hx   . Hypertension Neg Hx     Social History Social History  Substance Use Topics  . Smoking status: Former Smoker    Quit date: 09/26/2006  . Smokeless tobacco: Never Used  . Alcohol use 2.4 oz/week    4 Glasses of wine per week     Allergies   Patient has no known  allergies.   Review of Systems Review of Systems  Reason unable to perform ROS: Psychiatric disorder.     Physical Exam Updated Vital Signs BP (!) 150/112 (BP Location: Left Arm)   Pulse 87   Temp 98.6 F (37 C) (Oral)   Resp 16   Ht 5' 9.5" (1.765 m)   SpO2 98%   Physical Exam  Constitutional: He is oriented to person, place, and time. He appears well-developed and well-nourished.  HENT:  Head: Normocephalic and atraumatic.  Eyes: Conjunctivae are normal.  Neck: Neck supple.  Cardiovascular: Normal rate and regular rhythm.   Pulmonary/Chest: Effort normal and breath sounds normal.  Abdominal: Soft. Bowel sounds are normal.  Musculoskeletal: Normal range of motion.  Neurological: He is alert and oriented to person, place, and time.  Skin: Skin is warm and dry.  Psychiatric:  Pressured speech, flight of ideas  Nursing note and vitals reviewed.    ED Treatments / Results  Labs (all labs ordered are listed, but only abnormal results are displayed) Labs Reviewed  COMPREHENSIVE METABOLIC PANEL - Abnormal; Notable for the following:       Result Value   Sodium 122 (*)    Chloride 89 (*)    Glucose, Bld 116 (*)    BUN <5 (*)    Creatinine, Ser 0.57 (*)    Total Bilirubin 2.0 (*)    All other components within normal limits  ACETAMINOPHEN LEVEL - Abnormal; Notable for the following:    Acetaminophen (Tylenol), Serum <10 (*)    All other components within normal limits  CBC - Abnormal; Notable for the following:    MCHC 37.9 (*)    All other components within normal limits  ETHANOL  SALICYLATE LEVEL  RAPID URINE DRUG SCREEN, HOSP PERFORMED    EKG  EKG Interpretation None       Radiology No results found.  Procedures Procedures (including critical care time)  Medications Ordered in ED Medications - No data to display   Initial Impression / Assessment and Plan / ED Course  I have reviewed the triage vital signs and the nursing notes.  Pertinent  labs & imaging results that were available during my care of the patient were reviewed by me and considered in my medical decision making (see chart for details).     Patient has a known history of schizophrenia and bipolar disorder. Mother reports manic behavior,  pressured speech.  Will consult behavioral health.  Final Clinical Impressions(s) / ED Diagnoses   Final diagnoses:  Schizophrenia, paranoid ([place])    New Prescriptions New Prescriptions   No medications on file     Nat Christen, MD 04/11/17 1408

## 2017-04-11 NOTE — ED Notes (Signed)
Called report to [facility] for transport.

## 2017-04-11 NOTE — Telephone Encounter (Signed)
Noted! Thank you

## 2017-04-11 NOTE — ED Triage Notes (Signed)
Patient brought over from [facility] for medical clearance.  Patient's mother states that dont believe that patient has been taking medications as prescribed. patient hasnt been able to sleep for past couple days. Patient denies any SI or HI.

## 2017-04-12 DIAGNOSIS — E871 Hypo-osmolality and hyponatremia: Secondary | ICD-10-CM | POA: Diagnosis present

## 2017-04-12 DIAGNOSIS — F319 Bipolar disorder, unspecified: Principal | ICD-10-CM

## 2017-04-12 DIAGNOSIS — Z811 Family history of alcohol abuse and dependence: Secondary | ICD-10-CM

## 2017-04-12 LAB — BASIC METABOLIC PANEL
Anion gap: 9 (ref 5–15)
BUN: 9 mg/dL (ref 6–20)
CALCIUM: 9 mg/dL (ref 8.9–10.3)
CO2: 22 mmol/L (ref 22–32)
CREATININE: 0.73 mg/dL (ref 0.61–1.24)
Chloride: 89 mmol/L — ABNORMAL LOW (ref 101–111)
GFR calc Af Amer: >60 mL/min (ref >60)
GFR calc non Af Amer: >60 mL/min (ref >60)
GLUCOSE: 147 mg/dL — AB (ref 65–99)
Potassium: 3.7 mmol/L (ref 3.5–5.1)
Sodium: 120 mmol/L — ABNORMAL LOW (ref 135–145)

## 2017-04-12 MED ORDER — LORAZEPAM 0.5 MG PO TABS
0.5000 mg | ORAL_TABLET | Freq: Four times a day (QID) | ORAL | Status: DC | PRN
  Administered 2017-04-12 – 2017-04-14 (×4): 0.5 mg via ORAL
  Filled 2017-04-12 (×4): qty 1

## 2017-04-12 MED ORDER — ARIPIPRAZOLE 15 MG PO TABS
15.0000 mg | ORAL_TABLET | Freq: Every day | ORAL | Status: DC
  Administered 2017-04-13 – 2017-04-14 (×2): 15 mg via ORAL
  Filled 2017-04-12 (×3): qty 1

## 2017-04-12 MED ORDER — SODIUM CHLORIDE 1 G PO TABS
1.0000 g | ORAL_TABLET | Freq: Two times a day (BID) | ORAL | Status: AC
  Administered 2017-04-12 (×2): 1 g via ORAL
  Filled 2017-04-12 (×2): qty 1

## 2017-04-12 NOTE — BHH Suicide Risk Assessment (Addendum)
[facility] Admission Suicide Risk Assessment   Nursing information obtained from:  Patient Demographic factors:  Male, Age 65 or older, Caucasian, Living alone Current Mental Status: see below Loss Factors:  On disability, limited support network  Historical Factors:  Bipolar Disorder  Risk Reduction Factors:  Resilience, desire for improvement  Total Time spent with patient: 45 minutes  Principal Problem: Bipolar Disorder, Manic  Diagnosis:   Patient Active Problem List   Diagnosis Date Noted  . Bipolar 1 disorder ([place]) [F31.9] 04/11/2017  . Medicare annual wellness visit, initial [Z00.00] 03/03/2015  . Advanced care planning/counseling discussion [Z71.89] 03/03/2015  . Health maintenance examination [Z00.00] 01/27/2014  . Pes planus [M21.40] 01/11/2008  . Osteoporosis [M81.0] 09/04/2007  . Ex-smoker [X44.818] 09/04/2007  . Paranoid schizophrenia ([place]) [F20.0] 07/18/2007  . Hematuria [R31.9] 07/18/2007  . Benign prostatic hyperplasia [N40.0] 07/18/2007    Continued Clinical Symptoms:  Alcohol Use Disorder Identification Test Final Score (AUDIT): 5 The "Alcohol Use Disorders Identification Test", Guidelines for Use in Primary Care, Second Edition.  World Pharmacologist [facility]). Score between 0-7:  no or low risk or alcohol related problems. Score between 8-15:  moderate risk of alcohol related problems. Score between 16-19:  high risk of alcohol related problems. Score 20 or above:  warrants further diagnostic evaluation for alcohol dependence and treatment.   CLINICAL FACTORS:  66 year old single male, no children, lives alone, currently on disability .  Patient states he agreed to come to the ED ( came with mother) , because " I was overwhelmed, I knew I needed help". He is a fair historian- as per chart notes, mother reported that patient has a history of mental illness, which had been controlled for years, but recently became  hyperactive, with very poor sleep. Patient  presents with rapid , pressured speech, and tangential/loose  thought process .  Patient presents preoccupied by current news, states he had been watching a lot of news lately, and was worried about the poor relationship between [place] and [place]. States " I thought [employer] had started, I heard rumbles , I thought the world had ended". He states " I don't think that anymore". He also worries about " a loan I got on my house ". Patient denies depression , and describes his mood as " calmer than yesterday", describes energy level as " high".  Denies any suicidal ideations .  Denies alcohol or drug abuse , admission UDS negative, admission BAL < 5   He has a history of chronic mental illness, has been diagnosed with Bipolar Disorder. Last admitted 13. One suicide attempt by overdosing 1978. Patient states he has been on a combination of Depakote ( years) , and Abilify ( more than a year ) , Prozac.  Reports history of  HTN, and foot pain related to " bunions and a deformed ankle" - states this is alleviated by orthotics . NKDA . Stopped smoking 9 years ago.  Dx- Bipolar Disorder, Manic   Plan- Inpatient admission .  Increase Abilify to 15  mgrs QDAY ,  At this time would D/C Depakote ER as it could be a contributor to hyponatremia. Start  Ativan 0.5 mgrs Q6 hours PRN for anxiety Will D/C Prozac as antidepressant could contribute to mood instability and patient is currently manic D/C Cogentin as no EPS and unlikely on Abilify, to minimize risk of side effects Recheck BMP to follow up on hyponatremia- will request hospitalist consult        Musculoskeletal: Strength &  Muscle Tone: within normal limits Gait & Station: normal Patient leans: N/A  Psychiatric Specialty Exam: Physical Exam  ROS denies chest pain, no shortness of breath, no nausea, no vomiting   Blood pressure 123/73, pulse 85, temperature 98 F (36.7 C), temperature source Oral, resp. rate 16, height 5\' 9"  (1.753 m), weight  84.4 kg (186 lb), SpO2 100 %.Body mass index is 27.47 kg/m.  General Appearance: Fairly Groomed  Eye Contact:  Good  Speech:  Pressured  Volume:  Normal  Mood:  denies depression, mood " pretty good "  Affect:  not irritable, vaguely anxious, expansive   Thought Process:  Disorganized and Descriptions of Associations: Loose  Orientation:  Full (Time, Place, and Person) he is oriented x 3   Thought Content:  denies hallucinations, no delusions, but focused on current events and concerns about poor relationship between [place] and [place]- however, at this time no clear delusions expressed   Suicidal Thoughts:  No denies any suicidal or self injurious ideations, denies any homicidal or violent ideations  Homicidal Thoughts:  No  Memory:  recent and remote grossly intact   Judgement:  Fair  Insight:  Fair  Psychomotor Activity:  slightly restless, fidgety at times   Concentration:  Concentration: Fair and Attention Span: Fair  Recall:  [employer] of Knowledge:  Good  Language:  Good- pressured   Akathisia:  No  Handed:  Right  AIMS (if indicated):     Assets:  Desire for Improvement Resilience  ADL's:  Fair   Cognition:  WNL  Sleep:         COGNITIVE FEATURES THAT CONTRIBUTE TO RISK:  Closed-mindedness and Loss of executive function    SUICIDE RISK:   Moderate:  Frequent suicidal ideation with limited intensity, and duration, some specificity in terms of plans, no associated intent, good self-control, limited dysphoria/symptomatology, some risk factors present, and identifiable protective factors, including available and accessible social support.  PLAN OF CARE: Patient will be admitted to inpatient psychiatric unit for stabilization and safety. Will provide and encourage milieu participation. Provide medication management and maked adjustments as needed.  Will follow daily.    I certify that inpatient services furnished can reasonably be expected to improve the patient's condition.    Jenne Campus, MD 04/12/2017, 9:16 AM
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
## 2017-04-12 NOTE — Progress Notes (Signed)
Recreation Therapy Notes   Date: 04/12/2017 Time: 9:30am Location: 300 [address] Dayroom  Group Topic: Stress Management  Goal Area(s) Addresses:  Patient will verbalize importance of using healthy stress management.  Patient will identify positive emotions associated with healthy stress management.   Intervention: Stress Management  Activity :  Meditation. Recreation Therapy Intern introduced the stress management technique of meditation. Recreation Therapy Intern read a script that allowed patients to help patients meditate. Recreation Therapy Intern played zen meditation music. Patients were to follow along as script was read to engage in the activity.  Education: Stress Management, Discharge Planning.   Education Outcome: Needs additional edcuation  Clinical Observations/Feedback: Pt did not attend group.  Donovan Kail, Recreation Therapy Intern  Victorino Sparrow, LRT/CTRS

## 2017-04-12 NOTE — Progress Notes (Signed)
Patient seen pacing the unit. Appear anxious. Patient stated he can't urinate despite having the urge to urinate. Spencer PA notified - ordered Flomax 0.4 mg and Valium 5 mg. Patient slept off within 10 mins of taking the med. Continues to sleep at this time. Will continue to monitor patient.

## 2017-04-12 NOTE — [facility] Group Notes (Signed)
[facility] Group Therapy 04/12/2017 1:15pm  Type of Therapy: [facility] Presentation  Participation Level: Active  Participation Quality: Attentive  Affect: Appropriate  Cognitive: Oriented  Insight: Developing/Improving  Engagement in Therapy: Engaged  Modes of Intervention: Discussion, Education and Socialization  Summary of Progress/Problems: [facility] ([place]) Speaker came to talk about his personal journey with substance abuse and addiction. The pt processed ways by which to relate to the speaker. Edie speaker provided handouts and educational information pertaining to groups and services offered by the [facility]. Pt was engaged in speaker's presentation and was receptive to resources provided.    Adriana Reams, LCSW 04/12/2017 3:42 PM

## 2017-04-12 NOTE — Progress Notes (Signed)
Patient ID: Robert Fowler, male   DOB: 01-11-1951, 66 y.o.   MRN: [id number]  Pt currently presents with an anxious affect and restless behavior. Pt seen pacing the halls, remains isolated in the consult area on the end of the hall. Pt ruminates about what he "saw" before he came into the hospital, becomes visibly upset when talking about his delusions. Pt states "It's hard to have God give you knowledge of things that no one else knows." Pt reports good sleep with current medication regimen. Reports that he does not want to take "medication that will sedate me."  Pt provided with medications per providers orders. Pt's labs and vitals were monitored throughout the night. Pt given a 1:1 about emotional and mental status. Pt supported and encouraged to express concerns and questions. Pt educated on medications.  Pt's safety ensured with 15 minute and environmental checks. Pt currently denies SI/HI. Endorses religiously oriented delusions along with visual hallucinations of things "on tv." Pt verbally agrees to seek staff if SI/HI or A/VH occurs and to consult with staff before acting on any harmful thoughts. Pt still concerned about urination patterns, reported that he is going consistently now after drinking. Will continue POC.

## 2017-04-12 NOTE — H&P (Signed)
Psychiatric Admission Assessment Adult  Patient Identification: Robert Fowler MRN:  [id number] Date of Evaluation:  04/12/2017 Chief Complaint:  BIPOLAR 1 MRE ; MANIC Principal Diagnosis: Bipolar 1 disorder ([place]) Diagnosis:   Patient Active Problem List   Diagnosis Date Noted  . Hyponatremia [E87.1] 04/12/2017  . Bipolar 1 disorder ([place]) [F31.9] 04/11/2017  . Medicare annual wellness visit, initial [Z00.00] 03/03/2015  . Advanced care planning/counseling discussion [Z71.89] 03/03/2015  . Health maintenance examination [Z00.00] 01/27/2014  . Pes planus [M21.40] 01/11/2008  . Osteoporosis [M81.0] 09/04/2007  . Ex-smoker [Q30.092] 09/04/2007  . Paranoid schizophrenia ([place]) [F20.0] 07/18/2007  . Hematuria [R31.9] 07/18/2007  . Benign prostatic hyperplasia [N40.0] 07/18/2007   History of Present Illness: 66 y.o. Male presented to the ED due to manic behaviors after watching the news and the Trump/Putin conference. He felt that the meeting between [place] did not go well and he started thinking there may be [employer] and at that moment he felt a rumble in his house and that he thought they had started a nuclear war. He then states that he realizes now that it was probably a large truck driving by his house and also that he probably should stop watching so much news. He reports continuing all of his prescribed medications at home. He reports that he has not been eating or drinking as he should due to his manic feelings.  Objective:  Patient's labs show hyponatremia and Dr. Parke Poisson contacted for hospitalist consult. Will encourage patient to increase electrolyte rich fluids to improve lab levels and to assist in urination complaint last night. Patient states that he was told in the past he had an enlarged prostate and was prescribed Flomax last night and will continue it. Patient is very pleasant and cooperative, he has pressured speech, but may be his baseline per historical notes. He denies any  SI/HI/AVH at this time.   Associated Signs/Symptoms: Depression Symptoms:  None (Hypo) Manic Symptoms: None currently but upon admission had  Delusions, Impulsivity, Anxiety Symptoms:  Excessive Worry, Psychotic Symptoms:  Delusions, PTSD Symptoms: Negative Total Time spent with patient: 30 minutes  Past Psychiatric History: Long history of Schizophrenia and manic behaviors.   Is the patient at risk to self? No.  Has the patient been a risk to self in the past 6 months? No.  Has the patient been a risk to self within the distant past? No.  Is the patient a risk to others? No.  Has the patient been a risk to others in the past 6 months? No.  Has the patient been a risk to others within the distant past? No.   Prior Inpatient Therapy: Prior Inpatient Therapy: Yes Prior Therapy Dates: 1971, 1979, & 1990 Prior Therapy Facilty/Provider(s): [facility] (71 & 79) & [facility] Reason for Treatment: depression, bipolar Prior Outpatient Therapy: Prior Outpatient Therapy: Yes Prior Therapy Dates: currently Prior Therapy Facilty/Provider(s): [facility] Reason for Treatment: bipolar d/o, med managment Does patient have an ACCT team?: No Does patient have Intensive In-House Services?  : No Does patient have [facility] services? : Yes Does patient have P4[facility] services?: Unknown  Alcohol Screening: 1. How often do you have a drink containing alcohol?: 2 to 3 times a week 2. How many drinks containing alcohol do you have on a typical day when you are drinking?: 1 or 2 3. How often do you have six or more drinks on one occasion?: Never Preliminary Score: 0 9. Have you or someone else been injured as a result  of your drinking?: No 10. Has a relative or friend or a doctor or another health worker been concerned about your drinking or suggested you cut down?: Yes, but not in the last year Alcohol Use Disorder Identification Test Final Score (AUDIT): 5 Brief Intervention: AUDIT score less than 7 or  less-screening does not suggest unhealthy drinking-brief intervention not indicated Substance Abuse History in the last 12 months:  No. Consequences of Substance Abuse: Negative Previous Psychotropic Medications: Yes  Psychological Evaluations: Yes  Past Medical History:  Past Medical History:  Diagnosis Date  . Arthritis   . BENIGN PROSTATIC HYPERTROPHY 07/18/2007  . Bipolar disorder ([place])   . Habitual alcohol use   . OSTEOPOROSIS 09/04/2007   DEXA -2.8 lumbar (03/2015)  . Pes planus   . Prediabetes 2014  . Schizophrenia, paranoid ([place])    schizoaffective disorder    Past Surgical History:  Procedure Laterality Date  . INGUINAL HERNIA REPAIR Right 08/2014   Dr Gershon Crane   Family History:  Family History  Problem Relation Age of Onset  . Alcohol abuse Father   . Alcohol abuse Maternal Grandmother   . COPD Mother        smoker  . CAD Neg Hx   . Stroke Neg Hx   . Cancer Neg Hx   . Diabetes Neg Hx   . Hypertension Neg Hx    Family Psychiatric  History: None noted Tobacco Screening: Have you used any form of tobacco in the last 30 days? (Cigarettes, Smokeless Tobacco, Cigars, and/or Pipes): No Social History:  History  Alcohol Use  . 2.4 oz/week  . 4 Glasses of wine per week     History  Drug Use  . Types: Marijuana    Additional Social History: Marital status: Single Does patient have children?: No    Pain Medications: pt denies abuse - see pta meds list Prescriptions: pt denies abuse - see pta meds list Over the Counter: pt denies abuse - see pta meds list History of alcohol / drug use?: Yes Name of Substance 1: etoh 1 - Age of First Use: 19 1 - Amount (size/oz): 8 oz glass wine 1 - Frequency: twice a week 1 - Duration: years 1 - Last Use / Amount: unknown   Allergies:  No Known Allergies Lab Results:  Results for orders placed or performed during the hospital encounter of 04/11/17 (from the past 48 hour(s))  Comprehensive metabolic panel     Status:  Abnormal   Collection Time: 04/11/17 10:34 AM  Result Value Ref Range   Sodium 122 (L) 135 - 145 mmol/L   Potassium 3.8 3.5 - 5.1 mmol/L   Chloride 89 (L) 101 - 111 mmol/L   CO2 22 22 - 32 mmol/L   Glucose, Bld 116 (H) 65 - 99 mg/dL   BUN <5 (L) 6 - 20 mg/dL   Creatinine, Ser 0.57 (L) 0.61 - 1.24 mg/dL   Calcium 9.5 8.9 - 10.3 mg/dL   Total Protein 7.9 6.5 - 8.1 g/dL   Albumin 4.6 3.5 - 5.0 g/dL   AST 20 15 - 41 U/L   ALT 24 17 - 63 U/L   Alkaline Phosphatase 73 38 - 126 U/L   Total Bilirubin 2.0 (H) 0.3 - 1.2 mg/dL   GFR calc non Af Amer >60 >60 mL/min   GFR calc Af Amer >60 >60 mL/min    Comment: (NOTE) The eGFR has been calculated using the CKD EPI equation. This calculation has not been validated in  all clinical situations. eGFR's persistently <60 mL/min signify possible Chronic Kidney Disease.    Anion gap 11 5 - 15  cbc     Status: Abnormal   Collection Time: 04/11/17 10:34 AM  Result Value Ref Range   WBC 6.4 4.0 - 10.5 K/uL   RBC 5.19 4.22 - 5.81 MIL/uL   Hemoglobin 15.9 13.0 - 17.0 g/dL   HCT 42.0 39.0 - 52.0 %   MCV 80.9 78.0 - 100.0 fL   MCH 30.6 26.0 - 34.0 pg   MCHC 37.9 (H) 30.0 - 36.0 g/dL    Comment: RULED OUT INTERFERING SUBSTANCES   RDW 13.4 11.5 - 15.5 %   Platelets 249 150 - 400 K/uL  Ethanol     Status: None   Collection Time: 04/11/17 10:35 AM  Result Value Ref Range   Alcohol, Ethyl (B) <5 <5 mg/dL    Comment:        LOWEST DETECTABLE LIMIT FOR SERUM ALCOHOL IS 5 mg/dL FOR MEDICAL PURPOSES ONLY   Salicylate level     Status: None   Collection Time: 04/11/17 10:35 AM  Result Value Ref Range   Salicylate Lvl <2.0 2.8 - 30.0 mg/dL  Acetaminophen level     Status: Abnormal   Collection Time: 04/11/17 10:35 AM  Result Value Ref Range   Acetaminophen (Tylenol), Serum <10 (L) 10 - 30 ug/mL    Comment:        THERAPEUTIC CONCENTRATIONS VARY SIGNIFICANTLY. A RANGE OF 10-30 ug/mL MAY BE AN EFFECTIVE CONCENTRATION FOR MANY PATIENTS. HOWEVER,  SOME ARE BEST TREATED AT CONCENTRATIONS OUTSIDE THIS RANGE. ACETAMINOPHEN CONCENTRATIONS >150 ug/mL AT 4 HOURS AFTER INGESTION AND >50 ug/mL AT 12 HOURS AFTER INGESTION ARE OFTEN ASSOCIATED WITH TOXIC REACTIONS.   Rapid urine drug screen (hospital performed)     Status: None   Collection Time: 04/11/17 10:37 AM  Result Value Ref Range   Opiates NONE DETECTED NONE DETECTED   Cocaine NONE DETECTED NONE DETECTED   Benzodiazepines NONE DETECTED NONE DETECTED   Amphetamines NONE DETECTED NONE DETECTED   Tetrahydrocannabinol NONE DETECTED NONE DETECTED   Barbiturates NONE DETECTED NONE DETECTED    Comment:        DRUG SCREEN FOR MEDICAL PURPOSES ONLY.  IF CONFIRMATION IS NEEDED FOR ANY PURPOSE, NOTIFY LAB WITHIN 5 DAYS.        LOWEST DETECTABLE LIMITS FOR URINE DRUG SCREEN Drug Class       Cutoff (ng/mL) Amphetamine      1000 Barbiturate      200 Benzodiazepine   355 Tricyclics       974 Opiates          300 Cocaine          300 THC              50     Blood Alcohol level:  Lab Results  Component Value Date   ETH <5 16/38/4536    Metabolic Disorder Labs:  Lab Results  Component Value Date   HGBA1C 5.3 01/20/2014   No results found for: PROLACTIN Lab Results  Component Value Date   CHOL 181 02/26/2015   TRIG 64.0 02/26/2015   HDL 80.50 02/26/2015   CHOLHDL 2 02/26/2015   VLDL 12.8 02/26/2015   LDLCALC 88 02/26/2015   LDLCALC 78 07/23/2013    Current Medications: Current Facility-Administered Medications  Medication Dose Route Frequency Provider Last Rate Last Dose  . acetaminophen (TYLENOL) tablet 650 mg  650 mg Oral Q6H PRN  Hughie Closs A, NP      . alum & mag hydroxide-simeth (MAALOX/MYLANTA) 200-200-20 MG/5ML suspension 30 mL  30 mL Oral Q4H PRN Okonkwo, Justina A, NP      . ARIPiprazole (ABILIFY) tablet 10 mg  10 mg Oral Daily Okonkwo, Justina A, NP   10 mg at 04/12/17 0849  . aspirin chewable tablet 81 mg  81 mg Oral Daily Okonkwo, Justina A, NP    81 mg at 04/12/17 0849  . cholecalciferol (VITAMIN D) tablet 2,000 Units  2,000 Units Oral Daily Okonkwo, Justina A, NP   2,000 Units at 04/12/17 0849  . divalproex (DEPAKOTE ER) 24 hr tablet 750 mg  750 mg Oral QHS Okonkwo, Justina A, NP   750 mg at 04/11/17 2104  . LORazepam (ATIVAN) tablet 0.5 mg  0.5 mg Oral Q6H PRN Cobos, Myer Peer, MD      . magnesium hydroxide (MILK OF MAGNESIA) suspension 30 mL  30 mL Oral Daily PRN Okonkwo, Justina A, NP      . tamsulosin (FLOMAX) capsule 0.4 mg  0.4 mg Oral Daily Patriciaann Clan E, PA-C   0.4 mg at 04/12/17 0848  . traZODone (DESYREL) tablet 50 mg  50 mg Oral QHS PRN Lu Duffel, Justina A, NP       PTA Medications: Prescriptions Prior to Admission  Medication Sig Dispense Refill Last Dose  . acetaminophen (TYLENOL) 500 MG tablet Take 1,000 mg by mouth every 6 (six) hours as needed for moderate pain or headache.   Past Month at Unknown time  . alendronate (FOSAMAX) 70 MG tablet Take 1 tablet (70 mg total) by mouth every 7 (seven) days. Take with a full glass of water on an empty stomach. 4 tablet 11   . ARIPiprazole (ABILIFY) 10 MG tablet Take 10 mg by mouth daily.   04/10/2017 at Unknown time  . aspirin 81 MG tablet Take 81 mg by mouth daily.   04/10/2017 at Unknown time  . benztropine (COGENTIN) 0.5 MG tablet Take 0.5 mg by mouth at bedtime.   Past Week at Unknown time  . CALCIUM CITRATE PO Take 1 tablet by mouth daily.   Past Week at Unknown time  . Cholecalciferol (VITAMIN D) 2000 units CAPS Take 1 capsule by mouth daily.   04/10/2017 at Unknown time  . divalproex (DEPAKOTE ER) 250 MG 24 hr tablet Take 250 mg by mouth at bedtime. Takes with 558m for a total dose of 759m  04/10/2017 at Unknown time  . divalproex (DEPAKOTE ER) 500 MG 24 hr tablet Take 750 mg by mouth at bedtime.    04/10/2017 at Unknown time  . FLUoxetine (PROZAC) 40 MG capsule Take 40 mg by mouth daily.   04/10/2017 at Unknown time  . magnesium oxide (MAG-OX) 400 MG tablet Take 400 mg by  mouth daily as needed.   Past Week at Unknown time  . Melatonin 5 MG TABS Take 1-5 mg by mouth at bedtime as needed.    Past Week at Unknown time  . Multiple Vitamin (MULTIVITAMIN) tablet Take 1 tablet by mouth daily.   04/10/2017 at Unknown time  . neomycin-polymyxin-pramoxine (NEOSPORIN PLUS) 1 % cream Apply 1 application topically 2 (two) times daily as needed (minor cuts/wounds).   Past Month at Unknown time  . Omega-3 Fatty Acids (FISH OIL PO) Take 1,200 mg by mouth daily.   04/10/2017 at Unknown time    Musculoskeletal: Strength & Muscle Tone: within normal limits Gait & Station: normal Patient leans: Right  Psychiatric Specialty Exam: Physical Exam  ROS  Blood pressure 123/73, pulse 85, temperature 98 F (36.7 C), temperature source Oral, resp. rate 16, height _0  (1.753 m), weight 84.4 kg (186 lb), SpO2 100 %.Body mass index is 27.47 kg/m.  General Appearance: Casual and Fairly Groomed  Eye Contact:  Good  Speech:  Pressured  Volume:  Normal  Mood:  Euthymic  Affect:  Appropriate  Thought Process:  Coherent and Descriptions of Associations: Intact  Orientation:  Full (Time, Place, and Person)  Thought Content:  WDL  Suicidal Thoughts:  No  Homicidal Thoughts:  No  Memory:  Immediate;   Good Recent;   Good Remote;   Good  Judgement:  Good  Insight:  Good  Psychomotor Activity:  Normal  Concentration:  Concentration: Good and Attention Span: Good  Recall:  Good  Fund of Knowledge:  Good  Language:  Good  Akathisia:  No  Handed:  Right  AIMS (if indicated):     Assets:  Financial Resources/Insurance Housing Social Support  ADL's:  Intact  Cognition:  WNL  Sleep:       Treatment Plan Summary: Daily contact with patient to assess and evaluate symptoms and progress in treatment, Medication management and Plan Continue Abilify 10 mg Po Daily, Depakote ER 750 mg PO QHS, Ativan 0.5 mg PO Q6H PRN. Will discontinue Prozac and Cogentin. Per Dr. Ree Shay  note. -Encourage group therapy -Increase fluid intake -Continue Flomax 0.4 mg PO Daily   Observation Level/Precautions:  15 minute checks  Laboratory:  CBC Chemistry Profile UDS   Psychotherapy:  Group Therapy  Medications:  As needed  Consultations:  As needed  Discharge Concerns:  Supportive  Estimated LOS: 3-5 days  Other:  Admit to [facility] for Primary Diagnosis: Bipolar 1 disorder ([place]) Long Term Goal(s): Improvement in symptoms so as ready for discharge  Short Term Goals: Ability to identify and develop effective coping behaviors will improve  Physician Treatment Plan for Secondary Diagnosis: Principal Problem:   Bipolar 1 disorder ([place]) Active Problems:   Benign prostatic hyperplasia   Hyponatremia  Long Term Goal(s): Improvement in symptoms so as ready for discharge  Short Term Goals: Ability to verbalize feelings will improve, Ability to maintain clinical measurements within normal limits will improve and Compliance with prescribed medications will improve  I certify that inpatient services furnished can reasonably be expected to improve the patient's condition.    Lewis Shock, FNP 7/18/201811:00 AM  I have discussed case with NP and have met with patient Agree with NP assessment  57 year old single male, no children, lives alone, currently on disability .  Patient states he agreed to come to the ED ( came with mother) , because " I was overwhelmed, I knew I needed help". He is a fair historian- as per chart notes, mother reported that patient has a history of mental illness, which had been controlled for years, but recently became  hyperactive, with very poor sleep. Patient presents with rapid , pressured speech, and tangential/loose  thought process .  Patient presents preoccupied by current news, states he had been watching a lot of news lately, and was worried about the poor relationship between [place] and [place]. States " I thought  [employer] had started, I heard rumbles , I thought the world had ended". He states " I don't think that anymore". He also worries about " a loan I got on my house ". Patient denies  depression , and describes his mood as " calmer than yesterday", describes energy level as " high".  Denies any suicidal ideations .  Denies alcohol or drug abuse , admission UDS negative, admission BAL < 5   He has a history of chronic mental illness, has been diagnosed with Bipolar Disorder. Last admitted 65. One suicide attempt by overdosing 1978. Patient states he has been on a combination of Depakote ( years) , and Abilify ( more than a year ) , Prozac.  Reports history of  HTN, and foot pain related to " bunions and a deformed ankle" - states this is alleviated by orthotics . NKDA . Stopped smoking 9 years ago.  Dx- Bipolar Disorder, Manic   Plan- Inpatient admission .  Increase Abilify to 15  mgrs QDAY ,  At this time would D/C Depakote ER as it could be a contributor to hyponatremia. Start  Ativan 0.5 mgrs Q6 hours PRN for anxiety Will D/C Prozac as antidepressant could contribute to mood instability and patient is currently manic D/C Cogentin as no EPS and unlikely on Abilify, to minimize risk of side effects Recheck BMP to follow up on hyponatremia- will request hospitalist consult

## 2017-04-12 NOTE — Consult Note (Signed)
[facility] Medical Consultation  Robert Fowler JOA:[id number] DOB: 1951/08/16 DOA: 04/11/2017 PCP: [name], MD   Requesting physician: Dr. Parke Poisson Reason for consultation: hyponatremia Date of consultation: 04/12/2017  Chief Complaint: Low sodium  HPI:  66 year old man currently admitted to behavioral [facility] for bipolar disorder with mania who is noted to have hyponatremia, asymptomatic. Consultation was requested for treatment recommendations in regard to hyponatremia.  Patient reports he's been told in the past these had low sodium. He reports he drinks at least 6 bottles of water a day. Food intake may be limited although not clear from history. He reports some dizziness but no nausea or vomiting or other complaints at this point. No specific aggravating or alleviating factors.  Review of Systems:  Negative for fever, rash, new muscle aches, chest pain, shortness of breath, abdominal pain, nausea, vomiting  Positive for difficulty seeing with his current glasses, he plans to go to the eye doctor in the future. Positive for sore throat a few days ago, difficulty voiding over the last few days.  Past Medical History:  Diagnosis Date  . Arthritis   . BENIGN PROSTATIC HYPERTROPHY 07/18/2007  . Bipolar disorder ([place])   . Habitual alcohol use   . OSTEOPOROSIS 09/04/2007   DEXA -2.8 lumbar (03/2015)  . Pes planus   . Prediabetes 2014  . Schizophrenia, paranoid ([place])    schizoaffective disorder   Past Surgical History:  Procedure Laterality Date  . INGUINAL HERNIA REPAIR Right 08/2014   Dr Gershon Crane   No Known Allergies Social History:  reports that he quit smoking about 10 years ago. He has never used smokeless tobacco. He reports that he drinks about 2.4 oz of alcohol per week . He reports that he uses drugs, including Marijuana. Family History  Problem Relation Age of Onset  . Alcohol abuse Father   . Alcohol abuse Maternal Grandmother   . COPD Mother         smoker  . CAD Neg Hx   . Stroke Neg Hx   . Cancer Neg Hx   . Diabetes Neg Hx   . Hypertension Neg Hx     Prior to Admission medications   Medication Sig Start Date End Date Taking? Authorizing Provider  acetaminophen (TYLENOL) 500 MG tablet Take 1,000 mg by mouth every 6 (six) hours as needed for moderate pain or headache.    [provider]  alendronate (FOSAMAX) 70 MG tablet Take 1 tablet (70 mg total) by mouth every 7 (seven) days. Take with a full glass of water on an empty stomach. 04/10/17   [name], MD  ARIPiprazole (ABILIFY) 10 MG tablet Take 10 mg by mouth daily.    [provider]  aspirin 81 MG tablet Take 81 mg by mouth daily.    [provider]  benztropine (COGENTIN) 0.5 MG tablet Take 0.5 mg by mouth at bedtime.    [provider]  CALCIUM CITRATE PO Take 1 tablet by mouth daily.    [provider]  Cholecalciferol (VITAMIN D) 2000 units CAPS Take 1 capsule by mouth daily.    [provider]  divalproex (DEPAKOTE ER) 250 MG 24 hr tablet Take 250 mg by mouth at bedtime. Takes with 500mg  for a total dose of 750mg  03/20/17   [provider]  divalproex (DEPAKOTE ER) 500 MG 24 hr tablet Take 750 mg by mouth at bedtime.     [provider]  FLUoxetine (PROZAC) 40 MG capsule Take 40 mg by  mouth daily.    [provider]  magnesium oxide (MAG-OX) 400 MG tablet Take 400 mg by mouth daily as needed.    [provider]  Melatonin 5 MG TABS Take 1-5 mg by mouth at bedtime as needed.     [provider]  Multiple Vitamin (MULTIVITAMIN) tablet Take 1 tablet by mouth daily.    [provider]  neomycin-polymyxin-pramoxine (NEOSPORIN PLUS) 1 % cream Apply 1 application topically 2 (two) times daily as needed (minor cuts/wounds).    [provider]  Omega-3 Fatty Acids (FISH OIL PO) Take 1,200 mg by mouth daily.    [provider]    Physical Exam: Vitals:    04/11/17 1700 04/11/17 1702 04/12/17 0559 04/12/17 0600  BP: 138/77 124/83 132/82 123/73  Pulse: 70 78 78 85  Resp: 18  16   Temp: 98.4 F (36.9 C)  98 F (36.7 C)   TempSrc: Oral  Oral   SpO2:      Weight: 84.4 kg (186 lb)     Height: 5\' 9"  (1.753 m)       Constitutional:  . Appears calm and comfortable Eyes:  . Pupils, irises, lids appear unremarkable.Normal conjunctivae and lids ENMT:  . Grossly normal hearing, lips, tongue  Neck:  No lymphadenopathy or masses, thyromegalyRespiratory:  . CTA bilaterally, no w/r/r.  . Respiratory effort normal.  Cardiovascular:  . RRR, no m/r/g . No LE extremity edema   Abdomen:  . Abdomen appears normal; no tenderness, no hepatomegaly Musculoskeletal:  . Grossly normal tone and strength upper and lower extremities. Ambulates without difficulty. No abnormal movements.  Skin:  . No rash or induration, nontender to palpation Psychiatric. Alert, oriented to self, location, month, year. Mood and affect appear grossly normal.   Labs Lab 04/11/17 1034  WBC 6.4  HGB 15.9  HCT 42.0  MCV 80.9  PLT 169    Basic Metabolic Panel:  Recent Labs Lab 04/11/17 1034  NA 122*  K 3.8  CL 89*  CO2 22  GLUCOSE 116*  BUN <5*  CREATININE 0.57*  CALCIUM 9.5   Liver Function Tests:  Recent Labs Lab 04/11/17 1034  AST 20  ALT 24  ALKPHOS 73  BILITOT 2.0*  PROT 7.9  ALBUMIN 4.6    Inpatient Medications:   Scheduled Meds: . ARIPiprazole  10 mg Oral Daily  . aspirin  81 mg Oral Daily  . cholecalciferol  2,000 Units Oral Daily  . divalproex  750 mg Oral QHS  . tamsulosin  0.4 mg Oral Daily   Continuous Infusions:    Principal Problem:   Bipolar 1 disorder (HCC) Active Problems:   Benign prostatic hyperplasia   Hyponatremia   Impression/Recommendations #1 Hyponatremia. Etiology unclear, hyponatremia also seen June 2018, 2017, 2016 and 2014, albeit mild in nature. TSH within normal limits 2014. -Asymptomatic, no  confusion, nausea or vomiting. -May be secondary to aggressive water intake at home, poor solute intake, also on 2 medications which can cause SIADH: Lisinopril and trazodone. History of alcohol use, also consider potomania. -Suggest at this point to limit water intake, increase solute intake, salt tablet, hold lisinopril. Could consider discontinuing trazodone, however would defer this to psychiatry. -Check TSH, BMP in a.m. -If fails to improve with above measures, may need further evaluation.  #2 Schizophrenia, bipolar, per psychiatry.  #3 BPH. -Agree with empiric Flomax  Thank you for this consultation.  Our [facility] hospitalist team will follow the patient with you.  Murray Hodgkins, MD  [facility] Direct contact: [phone] --Via [place]  --[email or web address]; password [email or web address]  7PM-7AM contact night coverage as above  Time spent: 45 minutes

## 2017-04-13 DIAGNOSIS — F1290 Cannabis use, unspecified, uncomplicated: Secondary | ICD-10-CM

## 2017-04-13 DIAGNOSIS — F419 Anxiety disorder, unspecified: Secondary | ICD-10-CM

## 2017-04-13 DIAGNOSIS — Z87891 Personal history of nicotine dependence: Secondary | ICD-10-CM

## 2017-04-13 DIAGNOSIS — F39 Unspecified mood [affective] disorder: Secondary | ICD-10-CM

## 2017-04-13 LAB — OSMOLALITY, URINE: OSMOLALITY UR: 156 mosm/kg — AB (ref 300–900)

## 2017-04-13 LAB — BASIC METABOLIC PANEL
Anion gap: 8 (ref 5–15)
BUN: 8 mg/dL (ref 6–20)
CALCIUM: 8.9 mg/dL (ref 8.9–10.3)
CO2: 24 mmol/L (ref 22–32)
CREATININE: 0.64 mg/dL (ref 0.61–1.24)
Chloride: 91 mmol/L — ABNORMAL LOW (ref 101–111)
GFR calc Af Amer: >60 mL/min (ref >60)
GLUCOSE: 129 mg/dL — AB (ref 65–99)
Potassium: 3.9 mmol/L (ref 3.5–5.1)
Sodium: 123 mmol/L — ABNORMAL LOW (ref 135–145)

## 2017-04-13 LAB — SODIUM, URINE, RANDOM: Sodium, Ur: 16 mmol/L

## 2017-04-13 LAB — TSH: TSH: 1.602 u[IU]/mL (ref 0.350–4.500)

## 2017-04-13 MED ORDER — GRX ANALGESIC BALM EX OINT
1.0000 "application " | TOPICAL_OINTMENT | CUTANEOUS | Status: DC | PRN
  Administered 2017-04-18: 1 via CUTANEOUS
  Filled 2017-04-13: qty 28
  Filled 2017-04-13: qty 85

## 2017-04-13 MED ORDER — HALOPERIDOL 5 MG PO TABS
5.0000 mg | ORAL_TABLET | Freq: Once | ORAL | Status: AC
  Administered 2017-04-13: 5 mg via ORAL
  Filled 2017-04-13 (×2): qty 1

## 2017-04-13 MED ORDER — SODIUM CHLORIDE 1 G PO TABS
1.0000 g | ORAL_TABLET | Freq: Two times a day (BID) | ORAL | Status: DC
  Administered 2017-04-13 – 2017-04-16 (×6): 1 g via ORAL
  Filled 2017-04-13 (×10): qty 1

## 2017-04-13 NOTE — [facility] Group Notes (Signed)

## 2017-04-13 NOTE — Progress Notes (Addendum)
Patient ID: Robert Fowler, male   DOB: 31-May-1951, 66 y.o.   MRN: [id number]  Pt requests for his pastor to visit him but cannot remember his phone number. Pt reports that it is in his address book in his dresser at home. Encouraged to ask family or friend to find it for him.
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
## 2017-04-13 NOTE — Progress Notes (Signed)
[facility] MD Progress Note  04/13/2017 12:37 PM Robert Fowler  MRN:  [id number] Subjective:  Patient reports feeling "OK". Denies medication side effects. Reports fair sleep.  Denies medication side effects. Objective : I have discussed case with treatment team and have met with patient . Patient continues to present with pressured speech, tangential/loose thought process, a subjective sense of increased energy, but today the severity of these symptoms is slightly improved . He does remain pleasant, and is not irritable or demanding .  His sleep remains poor, slept about 3 hours last night. States " I feel rested, my energy is still pretty good " Of note, patient presents with hyponatremia. I have discontinued Depakote ER as this medication may cause hyponatremia as side effects. Appreciate hospitalist consultation with regards to this issue. Today remains significantly hyponatremic ( Na 120) - denies associated symptoms. He is not delirious, and is currently alert, attentive, and oriented x 3. He denies dizziness , lightheadedness . Hospitalist service following- I have discussed case with Dr. Regalado today.   No disruptive behaviors on unit, pleasant on approach. Principal Problem: Bipolar 1 disorder (HCC) Diagnosis:   Patient Active Problem List   Diagnosis Date Noted  . Hyponatremia [E87.1] 04/12/2017  . Bipolar 1 disorder (HCC) [F31.9] 04/11/2017  . Medicare annual wellness visit, initial [Z00.00] 03/03/2015  . Advanced care planning/counseling discussion [Z71.89] 03/03/2015  . Health maintenance examination [Z00.00] 01/27/2014  . Pes planus [M21.40] 01/11/2008  . Osteoporosis [M81.0] 09/04/2007  . Ex-smoker [Z87.891] 09/04/2007  . Paranoid schizophrenia (HCC) [F20.0] 07/18/2007  . Hematuria [R31.9] 07/18/2007  . Benign prostatic hyperplasia [N40.0] 07/18/2007   Total Time spent with patient: 20 minutes  Past Medical History:  Past Medical History:  Diagnosis Date  . Arthritis   .  BENIGN PROSTATIC HYPERTROPHY 07/18/2007  . Bipolar disorder (HCC)   . Habitual alcohol use   . OSTEOPOROSIS 09/04/2007   DEXA -2.8 lumbar (03/2015)  . Pes planus   . Prediabetes 2014  . Schizophrenia, paranoid (HCC)    schizoaffective disorder    Past Surgical History:  Procedure Laterality Date  . INGUINAL HERNIA REPAIR Right 08/2014   Dr Tseui   Family History:  Family History  Problem Relation Age of Onset  . Alcohol abuse Father   . Alcohol abuse Maternal Grandmother   . COPD Mother        smoker  . CAD Neg Hx   . Stroke Neg Hx   . Cancer Neg Hx   . Diabetes Neg Hx   . Hypertension Neg Hx    Social History:  History  Alcohol Use  . 2.4 oz/week  . 4 Glasses of wine per week     History  Drug Use  . Types: Marijuana    Social History   Social History  . Marital status: Single    Spouse name: N/A  . Number of children: N/A  . Years of education: N/A   Social History Main Topics  . Smoking status: Former Smoker    Quit date: 09/26/2006  . Smokeless tobacco: Never Used  . Alcohol use 2.4 oz/week    4 Glasses of wine per week  . Drug use: Yes    Types: Marijuana  . Sexual activity: Not Asked   Other Topics Concern  . None   Social History Narrative   Lives alone.     Mother and brother live nearby.   Occupation: janitor at [employer]   Edu: 12th grade   Activity: mows   yard   Diet: good water, fruits/vegetables daily      Disability evaluation - no disability (04/2014)   Additional Social History:    Pain Medications: pt denies abuse - see pta meds list Prescriptions: pt denies abuse - see pta meds list Over the Counter: pt denies abuse - see pta meds list History of alcohol / drug use?: Yes Name of Substance 1: etoh 1 - Age of First Use: 19 1 - Amount (size/oz): 8 oz glass wine 1 - Frequency: twice a week 1 - Duration: years 1 - Last Use / Amount: unknown  Sleep: Poor  Appetite:  Fair  Current Medications: Current Facility-Administered  Medications  Medication Dose Route Frequency Provider Last Rate Last Dose  . acetaminophen (TYLENOL) tablet 650 mg  650 mg Oral Q6H PRN Okonkwo, Justina A, NP   650 mg at 04/12/17 2152  . alum & mag hydroxide-simeth (MAALOX/MYLANTA) 200-200-20 MG/5ML suspension 30 mL  30 mL Oral Q4H PRN Okonkwo, Justina A, NP      . ARIPiprazole (ABILIFY) tablet 15 mg  15 mg Oral Daily Cobos, Myer Peer, MD   15 mg at 04/13/17 0816  . aspirin chewable tablet 81 mg  81 mg Oral Daily Okonkwo, Justina A, NP   81 mg at 04/13/17 0816  . cholecalciferol (VITAMIN D) tablet 2,000 Units  2,000 Units Oral Daily Okonkwo, Justina A, NP   2,000 Units at 04/13/17 0816  . LORazepam (ATIVAN) tablet 0.5 mg  0.5 mg Oral Q6H PRN Cobos, Myer Peer, MD   0.5 mg at 04/12/17 2152  . magnesium hydroxide (MILK OF MAGNESIA) suspension 30 mL  30 mL Oral Daily PRN Lu Duffel, Justina A, NP      . tamsulosin (FLOMAX) capsule 0.4 mg  0.4 mg Oral Daily Laverle Hobby, PA-C   0.4 mg at 04/13/17 9735    Lab Results:  Results for orders placed or performed during the hospital encounter of 04/11/17 (from the past 48 hour(s))  Basic metabolic panel     Status: Abnormal   Collection Time: 04/12/17  6:24 PM  Result Value Ref Range   Sodium 120 (L) 135 - 145 mmol/L   Potassium 3.7 3.5 - 5.1 mmol/L   Chloride 89 (L) 101 - 111 mmol/L   CO2 22 22 - 32 mmol/L   Glucose, Bld 147 (H) 65 - 99 mg/dL   BUN 9 6 - 20 mg/dL   Creatinine, Ser 0.73 0.61 - 1.24 mg/dL   Calcium 9.0 8.9 - 10.3 mg/dL   GFR calc non Af Amer >60 >60 mL/min   GFR calc Af Amer >60 >60 mL/min    Comment: (NOTE) The eGFR has been calculated using the CKD EPI equation. This calculation has not been validated in all clinical situations. eGFR's persistently <60 mL/min signify possible Chronic Kidney Disease.    Anion gap 9 5 - 15    Comment: Performed at [facility], [place] [address] [place], [place] [address]    Blood Alcohol level:  Lab Results  Component  Value Date   ETH <5 42/68/3419    Metabolic Disorder Labs: Lab Results  Component Value Date   HGBA1C 5.3 01/20/2014   No results found for: PROLACTIN Lab Results  Component Value Date   CHOL 181 02/26/2015   TRIG 64.0 02/26/2015   HDL 80.50 02/26/2015   CHOLHDL 2 02/26/2015   VLDL 12.8 02/26/2015   LDLCALC 88 02/26/2015   LDLCALC 78 07/23/2013    Physical Findings: AIMS:  Facial and Oral Movements Muscles of Facial Expression: None, normal Lips and Perioral Area: None, normal Jaw: None, normal Tongue: None, normal,Extremity Movements Upper (arms, wrists, hands, fingers): None, normal Lower (legs, knees, ankles, toes): None, normal, Trunk Movements Neck, shoulders, hips: None, normal, Overall Severity Severity of abnormal movements (highest score from questions above): None, normal Incapacitation due to abnormal movements: None, normal Patient's awareness of abnormal movements (rate only patient's report): No Awareness, Dental Status Current problems with teeth and/or dentures?: No Does patient usually wear dentures?: No  CIWA:    COWS:     Musculoskeletal: Strength & Muscle Tone: within normal limits Gait & Station: normal Patient leans: N/A  Psychiatric Specialty Exam: Physical Exam  ROS no fever, no chest pain, no shortness of breath, no nausea or vomiting, no diarrhea endorsed   Blood pressure 131/78, pulse 81, temperature 97.8 F (36.6 C), temperature source Oral, resp. rate 18, height 5' 9" (1.753 m), weight 84.4 kg (186 lb), SpO2 100 %.Body mass index is 27.47 kg/m.  General Appearance: Fairly Groomed  Eye Contact:  Good  Speech:  Pressured  Volume:  Normal  Mood:  presents manic- some improvement compared to admission  Affect:  expansive   Thought Process:  Disorganized and Descriptions of Associations: Loose  Orientation:  Full (Time, Place, and Person)  Thought Content:  denies hallucinations, no delusions, not internally preoccupied   Suicidal  Thoughts:  No denies current suicidal or self injurious ideations, denies homicidal or violent ideations   Homicidal Thoughts:  No  Memory:  recent and remote grossly intact   Judgement:  Fair  Insight:  Fair  Psychomotor Activity:  slightly restless   Concentration:  Concentration: Fair and Attention Span: Fair  Recall:  Good  Fund of Knowledge:  Good  Language:  Good  Akathisia:  Negative  Handed:  Right  AIMS (if indicated):     Assets:  Desire for Improvement Resilience  ADL's:  Intact  Cognition:  WNL  Sleep:  Number of Hours: 2.5   Assessment - patient continues to present with symptoms of mania- symptoms include pressured speech, loose associations, poor sleep in spite of which he feels rested, increased energy. He is not presenting with psychotic symptoms at this time and he is not aggressive or significantly irritable at this time. He is found to have significant hyponatremia, but is not presenting with associated symptoms. Depakote ER was stopped ( he had been taking it prior to admission) as it can be associated with hyponatremia. Would also avoid Trazodone as sleep medication as this medication can also be associated with same .Hospitalist consultant following   Treatment Plan Summary: Daily contact with patient to assess and evaluate symptoms and progress in treatment, Medication management, Plan inpatient treatment  and medications as below Encourage group and milieu participation to work on coping skills and symptom reduction Continue Abilify now titrated to 15 mgrs QDAY for mood disorder  Continue Ativan 0.5 mgrs Q 6 hours PRN for anxiety as needed  Hospitalist Service following for management of hyponatremia- have ordered NaCl tablet daily with meals and fluid restriction instructions   Labs pending .  Treatment team working on disposition planning options.  Jenne Campus, MD 04/13/2017, 12:37 PM

## 2017-04-13 NOTE — [facility] Group Notes (Signed)
[place] LCSW Group Therapy 04/13/2017 1:15pm  Type of Therapy: Group Therapy- [facility]  Pt did not attend, declined invitation.    Adriana Reams, LCSW 04/13/2017 4:29 PM

## 2017-04-13 NOTE — Progress Notes (Cosign Needed)
Pt present with bright affect and jovial mood in the unit. Pt observed at some point had a period of confusion, did not know whether it was morning or evening. Pt sodium has been running, was to the lab for monitoring, pt has been placed on fluid restriction 1200 ml a day and  pt is been  compliant so far. As per self inventory, pt had a good night sleep, good appetite, normal energy, and good concentration. Pt rate depression at 0, hopeless ness at 0, and 0 anxiety. Pt's safety ensured with 15 minute and environmental checks. Pt currently denies SI/HI and A/V hallucinations. Pt verbally agrees to seek staff if SI/HI or A/VH occurs and to consult with staff before acting on these thoughts. Will continue POC.

## 2017-04-13 NOTE — Tx Team (Signed)
Interdisciplinary Treatment and Diagnostic Plan Update  04/13/2017 Time of Session: 9:30am Robert Fowler MRN: [id number]  Principal Diagnosis: Bipolar 1 disorder ([name])  Secondary Diagnoses: Principal Problem:   Bipolar 1 disorder ([name]) Active Problems:   Benign prostatic hyperplasia   Hyponatremia   Current Medications:  Current Facility-Administered Medications  Medication Dose Route Frequency Provider Last Rate Last Dose  . acetaminophen (TYLENOL) tablet 650 mg  650 mg Oral Q6H PRN Okonkwo, Justina A, NP   650 mg at 04/12/17 2152  . alum & mag hydroxide-simeth (MAALOX/MYLANTA) 200-200-20 MG/5ML suspension 30 mL  30 mL Oral Q4H PRN Okonkwo, Justina A, NP      . ARIPiprazole (ABILIFY) tablet 15 mg  15 mg Oral Daily Cobos, Myer Peer, MD   15 mg at 04/13/17 0816  . aspirin chewable tablet 81 mg  81 mg Oral Daily Okonkwo, Justina A, NP   81 mg at 04/13/17 0816  . cholecalciferol (VITAMIN D) tablet 2,000 Units  2,000 Units Oral Daily Okonkwo, Justina A, NP   2,000 Units at 04/13/17 0816  . LORazepam (ATIVAN) tablet 0.5 mg  0.5 mg Oral Q6H PRN Cobos, Myer Peer, MD   0.5 mg at 04/12/17 2152  . magnesium hydroxide (MILK OF MAGNESIA) suspension 30 mL  30 mL Oral Daily PRN Lu Duffel, Justina A, NP      . tamsulosin (FLOMAX) capsule 0.4 mg  0.4 mg Oral Daily Patriciaann Clan E, PA-C   0.4 mg at 04/13/17 4481    PTA Medications: Prescriptions Prior to Admission  Medication Sig Dispense Refill Last Dose  . acetaminophen (TYLENOL) 500 MG tablet Take 1,000 mg by mouth every 6 (six) hours as needed for moderate pain or headache.   Past Month at Unknown time  . alendronate (FOSAMAX) 70 MG tablet Take 1 tablet (70 mg total) by mouth every 7 (seven) days. Take with a full glass of water on an empty stomach. 4 tablet 11   . ARIPiprazole (ABILIFY) 10 MG tablet Take 10 mg by mouth daily.   04/10/2017 at Unknown time  . aspirin 81 MG tablet Take 81 mg by mouth daily.   04/10/2017 at Unknown time  . benztropine  (COGENTIN) 0.5 MG tablet Take 0.5 mg by mouth at bedtime.   Past Week at Unknown time  . CALCIUM CITRATE PO Take 1 tablet by mouth daily.   Past Week at Unknown time  . Cholecalciferol (VITAMIN D) 2000 units CAPS Take 1 capsule by mouth daily.   04/10/2017 at Unknown time  . divalproex (DEPAKOTE ER) 250 MG 24 hr tablet Take 250 mg by mouth at bedtime. Takes with '500mg'$  for a total dose of '750mg'$    04/10/2017 at Unknown time  . divalproex (DEPAKOTE ER) 500 MG 24 hr tablet Take 750 mg by mouth at bedtime.    04/10/2017 at Unknown time  . FLUoxetine (PROZAC) 40 MG capsule Take 40 mg by mouth daily.   04/10/2017 at Unknown time  . magnesium oxide (MAG-OX) 400 MG tablet Take 400 mg by mouth daily as needed.   Past Week at Unknown time  . Melatonin 5 MG TABS Take 1-5 mg by mouth at bedtime as needed.    Past Week at Unknown time  . Multiple Vitamin (MULTIVITAMIN) tablet Take 1 tablet by mouth daily.   04/10/2017 at Unknown time  . neomycin-polymyxin-pramoxine (NEOSPORIN PLUS) 1 % cream Apply 1 application topically 2 (two) times daily as needed (minor cuts/wounds).   Past Month at Unknown time  . Omega-3 Fatty  Acids (FISH OIL PO) Take 1,200 mg by mouth daily.   04/10/2017 at Unknown time    Treatment Modalities: Medication Management, Group therapy, Case management,  1 to 1 session with clinician, Psychoeducation, Recreational therapy.  Patient Stressors: Financial difficulties Occupational concerns Substance abuse  Patient Strengths: Network engineer for treatment/growth Physical Health Supportive family/friends  Physician Treatment Plan for Primary Diagnosis: Bipolar 1 disorder ([place]) Long Term Goal(s): Improvement in symptoms so as ready for discharge  Short Term Goals: Ability to identify and develop effective coping behaviors will improve Ability to verbalize feelings will improve Ability to maintain clinical measurements within normal limits will  improve Compliance with prescribed medications will improve  Medication Management: Evaluate patient's response, side effects, and tolerance of medication regimen.  Therapeutic Interventions: 1 to 1 sessions, Unit Group sessions and Medication administration.  Evaluation of Outcomes: Not Met  Physician Treatment Plan for Secondary Diagnosis: Principal Problem:   Bipolar 1 disorder ([place]) Active Problems:   Benign prostatic hyperplasia   Hyponatremia   Long Term Goal(s): Improvement in symptoms so as ready for discharge  Short Term Goals: Ability to identify and develop effective coping behaviors will improve Ability to verbalize feelings will improve Ability to maintain clinical measurements within normal limits will improve Compliance with prescribed medications will improve  Medication Management: Evaluate patient's response, side effects, and tolerance of medication regimen.  Therapeutic Interventions: 1 to 1 sessions, Unit Group sessions and Medication administration.  Evaluation of Outcomes: Not Met   RN Treatment Plan for Primary Diagnosis: Bipolar 1 disorder ([name]) Long Term Goal(s): Knowledge of disease and therapeutic regimen to maintain health will improve  Short Term Goals: Ability to verbalize feelings will improve, Ability to disclose and discuss suicidal ideas and Ability to identify and develop effective coping behaviors will improve  Medication Management: RN will administer medications as ordered by provider, will assess and evaluate patient's response and provide education to patient for prescribed medication. RN will report any adverse and/or side effects to prescribing provider.  Therapeutic Interventions: 1 on 1 counseling sessions, Psychoeducation, Medication administration, Evaluate responses to treatment, Monitor vital signs and CBGs as ordered, Perform/monitor CIWA, COWS, AIMS and Fall Risk screenings as ordered, Perform wound care treatments as  ordered.  Evaluation of Outcomes: Not Met   LCSW Treatment Plan for Primary Diagnosis: Bipolar 1 disorder ([name]) Long Term Goal(s): Safe transition to appropriate next level of care at discharge, Engage patient in therapeutic group addressing interpersonal concerns.  Short Term Goals: Engage patient in aftercare planning with referrals and resources, Identify triggers associated with mental health/substance abuse issues and Increase skills for wellness and recovery  Therapeutic Interventions: Assess for all discharge needs, 1 to 1 time with Social worker, Explore available resources and support systems, Assess for adequacy in community support network, Educate family and significant other(s) on suicide prevention, Complete Psychosocial Assessment, Interpersonal group therapy.  Evaluation of Outcomes: Not Met   Progress in Treatment: Attending groups: Yes Participating in groups: Yes Taking medication as prescribed: Yes, MD continues to assess for medication changes as needed Toleration medication: Yes, no side effects reported at this time Family/Significant other contact made: No, CSW attempting to make contact with mother Patient understands diagnosis: Continuing to assess Discussing patient identified problems/goals with staff: Yes Medical problems stabilized or resolved: Yes Denies suicidal/homicidal ideation: Yes Issues/concerns per patient self-inventory: None Other: N/A  New problem(s) identified: None identified at this time.   New Short Term/Long Term Goal(s): None identified at  this time.   Discharge Plan or Barriers: Pt will return home and follow-up [facility]. CSW assessing for appropriate therapy referrals.   Reason for Continuation of Hospitalization: Anxiety Delusions Mania Medication stabilization  Estimated Length of Stay: 3-5 days; Est DC date 7/24  Attendees: Patient: 04/13/2017  12:06 PM  Physician: Dr. Parke Poisson 04/13/2017  12:06 PM   Nursing: Opal Sidles, RN 04/13/2017  12:06 PM  RN Care Manager: Lars Pinks, RN 04/13/2017  12:06 PM  Social Worker: Adriana Reams, LCSW; Edwyna Shell, LCSW 04/13/2017  12:06 PM  Recreational Therapist:  04/13/2017  12:06 PM  Other: Lindell Spar, NP; Marvia Pickles, NP 04/13/2017  12:06 PM  Other:  04/13/2017  12:06 PM  Other: 04/13/2017  12:06 PM    Scribe for Treatment Team: Gladstone Lighter, LCSW 04/13/2017 12:06 PM

## 2017-04-13 NOTE — Progress Notes (Signed)
PROGRESS NOTE    Robert Fowler  [id number]:[id number] DOB: Feb 04, 1951 DOA: 04/11/2017 PCP: [name], MD    Brief Narrative: 66 year old man currently admitted to behavioral [place] for bipolar disorder with mania who is noted to have hyponatremia, asymptomatic. Consultation was requested for treatment recommendations in regard to hyponatremia.  Patient reports he's been told in the past these had low sodium. He reports he drinks at least 6 bottles of water a day. Food intake may be limited although not clear from history. He reports some dizziness but no nausea or vomiting or other complaints at this point. No specific aggravating or alleviating factors.    Assessment & Plan:   Principal Problem:   Bipolar 1 disorder ([place]) Active Problems:   Benign prostatic hyperplasia   Hyponatremia  1-Hyponatremia;  Reviewed prior records, patient with chronic hyponatremia, sodium 130 range.  Agree with stopping lisinopril. Also trazodone was stop.  Discussed with nurse, patient needs to follow water restriction.  Would continue with sodium tablet.  Await B-met for today.  TSH; ordered.  Will check urine sodium, urine osmolality   2 Schizophrenia, bipolar, per psychiatry.  3 BPH. -Agree with empiric Flomax  Subjective: He is alert in no distress, answer questions appropriately.  He report mild headaches, but he gets headaches at home too.  Dizziness some times.    Objective: Vitals:   04/12/17 0600 04/12/17 2011 04/13/17 0652 04/13/17 0653  BP: 123/73 (!) 140/107 (!) 151/91 131/78  Pulse: 85 80 69 81  Resp:   18   Temp:   97.8 F (36.6 C)   TempSrc:   Oral   SpO2:      Weight:      Height:       No intake or output data in the 24 hours ending 04/13/17 1331 Filed Weights   04/11/17 1700  Weight: 84.4 kg (186 lb)    Examination:  General exam: Appears calm and comfortable  Respiratory system: Clear to auscultation. Respiratory effort normal. Cardiovascular  system: S1 & S2 heard, RRR. No JVD, murmurs, rubs, gallops or clicks. No pedal edema. Gastrointestinal system: Abdomen is nondistended, soft and nontender. No organomegaly or masses felt. Normal bowel sounds heard. Central nervous system: Alert and oriented. No focal neurological deficits. Extremities: Symmetric 5 x 5 power. Skin: No rashes, lesions or ulcers Psychiatry: Judgement and insight appear normal. Mood & affect appropriate.     Data Reviewed: I have personally reviewed following labs and imaging studies  CBC:  Recent Labs Lab 04/11/17 1034  WBC 6.4  HGB 15.9  HCT 42.0  MCV 80.9  PLT 347   Basic Metabolic Panel:  Recent Labs Lab 04/11/17 1034 04/12/17 1824  NA 122* 120*  K 3.8 3.7  CL 89* 89*  CO2 22 22  GLUCOSE 116* 147*  BUN <5* 9  CREATININE 0.57* 0.73  CALCIUM 9.5 9.0   GFR: Estimated Creatinine Clearance: 92.1 mL/min (by C-G formula based on SCr of 0.73 mg/dL). Liver Function Tests:  Recent Labs Lab 04/11/17 1034  AST 20  ALT 24  ALKPHOS 73  BILITOT 2.0*  PROT 7.9  ALBUMIN 4.6   No results for input(s): LIPASE, AMYLASE in the last 168 hours. No results for input(s): AMMONIA in the last 168 hours. Coagulation Profile: No results for input(s): INR, PROTIME in the last 168 hours. Cardiac Enzymes: No results for input(s): CKTOTAL, CKMB, CKMBINDEX, TROPONINI in the last 168 hours. BNP (last 3 results) No results for input(s): PROBNP in the last 8760  hours. HbA1C: No results for input(s): HGBA1C in the last 72 hours. CBG: No results for input(s): GLUCAP in the last 168 hours. Lipid Profile: No results for input(s): CHOL, HDL, LDLCALC, TRIG, CHOLHDL, LDLDIRECT in the last 72 hours. Thyroid Function Tests: No results for input(s): TSH, T4TOTAL, FREET4, T3FREE, THYROIDAB in the last 72 hours. Anemia Panel: No results for input(s): VITAMINB12, FOLATE, FERRITIN, TIBC, IRON, RETICCTPCT in the last 72 hours. Sepsis Labs: No results for input(s):  PROCALCITON, LATICACIDVEN in the last 168 hours.  No results found for this or any previous visit (from the past 240 hour(s)).       Radiology Studies: No results found.      Scheduled Meds: . ARIPiprazole  15 mg Oral Daily  . aspirin  81 mg Oral Daily  . cholecalciferol  2,000 Units Oral Daily  . sodium chloride  1 g Oral BID WC  . tamsulosin  0.4 mg Oral Daily   Continuous Infusions:   LOS: 2 days    Time spent: 35 minutes,.     Elmarie Shiley, MD [facility] Pager [phone]  If 7PM-7AM, please contact night-coverage [email or web address] Password [email or web address] 04/13/2017, 1:31 PM

## 2017-04-13 NOTE — Progress Notes (Signed)
Per Dr. Parke Poisson, patient needs sodium level drawn and cannot wait until next [facility] draw this evening. Order received to send patient to [facility] lab (direct) with staff. [facility] General made aware as well as patient's primary nurse Opal Sidles, RN. Charge RN Engineer, mining at [employer] notified and confirms patient can go directly to phlebotomy and then return. Patient aware. New order for sodium tab will not be given until patient's level drawn.

## 2017-04-14 LAB — BASIC METABOLIC PANEL WITH GFR
Anion gap: 8 (ref 5–15)
BUN: 7 mg/dL (ref 6–20)
CO2: 26 mmol/L (ref 22–32)
Calcium: 9.1 mg/dL (ref 8.9–10.3)
Chloride: 93 mmol/L — ABNORMAL LOW (ref 101–111)
Creatinine, Ser: 0.75 mg/dL (ref 0.61–1.24)
GFR calc Af Amer: >60 mL/min
GFR calc non Af Amer: >60 mL/min
Glucose, Bld: 151 mg/dL — ABNORMAL HIGH (ref 65–99)
Potassium: 3.6 mmol/L (ref 3.5–5.1)
Sodium: 127 mmol/L — ABNORMAL LOW (ref 135–145)

## 2017-04-14 MED ORDER — HALOPERIDOL 2 MG PO TABS: 2.0000 mg | ORAL_TABLET | Freq: Two times a day (BID) | ORAL | Status: DC

## 2017-04-14 MED ORDER — ARIPIPRAZOLE 5 MG PO TABS
5.0000 mg | ORAL_TABLET | Freq: Once | ORAL | Status: AC
  Administered 2017-04-14: 5 mg via ORAL
  Filled 2017-04-14 (×2): qty 1

## 2017-04-14 MED ORDER — ARIPIPRAZOLE 10 MG PO TABS
20.0000 mg | ORAL_TABLET | Freq: Every day | ORAL | Status: DC
  Administered 2017-04-15: 20 mg via ORAL
  Filled 2017-04-14 (×3): qty 2

## 2017-04-14 MED ORDER — HALOPERIDOL LACTATE 5 MG/ML IJ SOLN: 2.0000 mg | Freq: Two times a day (BID) | INTRAMUSCULAR | Status: DC

## 2017-04-14 NOTE — Progress Notes (Signed)
Recreation Therapy Notes  Date: 04/14/2017 Time: 9:30am Location: 300 [address] Dayroom  Group Topic: Stress Management  Goal Area(s) Addresses:  Patient will verbalize importance of using healthy stress management.  Patient will identify positive emotions associated with healthy stress management.   Intervention: Stress Management  Activity : Relaxation Visualization. Recreation Therapy Intern introduced the stress management technique of visualization. Recreation Therapy Intern read a script that allowed patients to take mental trip to a wildlife sanctuary. Recreation Therapy Intern played nature music. Patients were to follow along as script was read to engage in the activity.  Education: Stress Management, Discharge Planning.   Education Outcome: Acknowledges edcuation  Clinical Observations/Feedback: Pt did not attend group.  Rachel Meyer, Recreation Therapy Intern  Ethel Veronica, LRT/CTRS 

## 2017-04-14 NOTE — Progress Notes (Signed)
PROGRESS NOTE    Robert Fowler  [id number]:[id number] DOB: 1951-01-09 DOA: 04/11/2017 PCP: [name], MD    Brief Narrative: 66 year old man currently admitted to behavioral [place] for bipolar disorder with mania who is noted to have hyponatremia, asymptomatic. Consultation was requested for treatment recommendations in regard to hyponatremia.  Patient reports he's been told in the past these had low sodium. He reports he drinks at least 6 bottles of water a day. Food intake may be limited although not clear from history. He reports some dizziness but no nausea or vomiting or other complaints at this point. No specific aggravating or alleviating factors.    Assessment & Plan:   Principal Problem:   Bipolar 1 disorder ([place]) Active Problems:   Benign prostatic hyperplasia   Hyponatremia  1-Hyponatremia; likely related to primary polydipsia.  Reviewed prior records, patient with chronic hyponatremia, sodium 130 range.  Agree with stopping lisinopril and trazodone.  Discussed with nurse, patient needs to follow water restriction.  Would continue with sodium tablet. Water restrictions.  Sodium increasing 120--123. Labs for today pending.  TSH; normal.  urine sodium 16, urine osmolality 156---consisting with primary polydipsia.   2 Schizophrenia, bipolar, per psychiatry.  3 BPH. -Agree with empiric Flomax  Subjective: He has pressure speech, tangential.  He is complaining of tongue burning after eating hot sauce.    Objective: Vitals:   04/13/17 0652 04/13/17 0653 04/14/17 0828 04/14/17 0829  BP: (!) 151/91 131/78 (!) 148/82 136/90  Pulse: 69 81 71 80  Resp: 18  20   Temp: 97.8 F (36.6 C)  97.8 F (36.6 C)   TempSrc: Oral  Oral   SpO2:      Weight:      Height:       No intake or output data in the 24 hours ending 04/14/17 1330 Filed Weights   04/11/17 1700  Weight: 84.4 kg (186 lb)    Examination:  General exam: no distress.  Respiratory system:  CTA Cardiovascular system: S 1, S 2 RRR Gastrointestinal system:BS present, soft, nt Central nervous system: non focal.  Extremities: symmetric power.  Skin: no rashes, no ulcer.      Data Reviewed: I have personally reviewed following labs and imaging studies  CBC:  Recent Labs Lab 04/11/17 1034  WBC 6.4  HGB 15.9  HCT 42.0  MCV 80.9  PLT 509   Basic Metabolic Panel:  Recent Labs Lab 04/11/17 1034 04/12/17 1824 04/13/17 0710  NA 122* 120* 123*  K 3.8 3.7 3.9  CL 89* 89* 91*  CO2 22 22 24   GLUCOSE 116* 147* 129*  BUN <5* 9 8  CREATININE 0.57* 0.73 0.64  CALCIUM 9.5 9.0 8.9   GFR: Estimated Creatinine Clearance: 92.1 mL/min (by C-G formula based on SCr of 0.64 mg/dL). Liver Function Tests:  Recent Labs Lab 04/11/17 1034  AST 20  ALT 24  ALKPHOS 73  BILITOT 2.0*  PROT 7.9  ALBUMIN 4.6   No results for input(s): LIPASE, AMYLASE in the last 168 hours. No results for input(s): AMMONIA in the last 168 hours. Coagulation Profile: No results for input(s): INR, PROTIME in the last 168 hours. Cardiac Enzymes: No results for input(s): CKTOTAL, CKMB, CKMBINDEX, TROPONINI in the last 168 hours. BNP (last 3 results) No results for input(s): PROBNP in the last 8760 hours. HbA1C: No results for input(s): HGBA1C in the last 72 hours. CBG: No results for input(s): GLUCAP in the last 168 hours. Lipid Profile: No results for input(s): CHOL,  HDL, LDLCALC, TRIG, CHOLHDL, LDLDIRECT in the last 72 hours. Thyroid Function Tests:  Recent Labs  04/13/17 1824  TSH 1.602   Anemia Panel: No results for input(s): VITAMINB12, FOLATE, FERRITIN, TIBC, IRON, RETICCTPCT in the last 72 hours. Sepsis Labs: No results for input(s): PROCALCITON, LATICACIDVEN in the last 168 hours.  No results found for this or any previous visit (from the past 240 hour(s)).       Radiology Studies: No results found.      Scheduled Meds: . [START ON 04/15/2017] ARIPiprazole  20 mg  Oral Daily  . aspirin  81 mg Oral Daily  . cholecalciferol  2,000 Units Oral Daily  . sodium chloride  1 g Oral BID WC  . tamsulosin  0.4 mg Oral Daily   Continuous Infusions:   LOS: 3 days    Time spent: 25 minutes,.     Elmarie Shiley, MD [facility] Pager [phone]  If 7PM-7AM, please contact night-coverage [email or web address] Password [email or web address] 04/14/2017, 1:30 PM

## 2017-04-14 NOTE — Progress Notes (Signed)
DAR Note: Patient seen pacing the unit. Patient stated "I am in a happy mood. I slept good last night. You all are amazing and I Iove you all". Patient appear to be in a manic state. Comes to this Probation officer every time asking if this Probation officer needs him and without waiting for a respond, patient will walk away saying "I am just happy today". As of this time, patient is still awake stated "not yet time for me to sleep. Don't worry about me. I am a good person. I will sleep in few minutes. Patient sitting out on hallway at this time. Will continue to monitor patient.

## 2017-04-14 NOTE — Progress Notes (Signed)
D: Pt denies SI/HI/AVH. Pt is pleasant and cooperative. Pt is religiously occupied with his thoughts. He did not attend group stating he was waiting for his pastor.  A: Pt was offered support and encouragement. Pt was encourage to attend groups but refused. . Q 15 minute checks were done for safety.  R:Pt interacts well with staff.  Pt has no complaints.Pt receptive to treatment and safety maintained on unit.

## 2017-04-14 NOTE — [facility] Group Notes (Signed)
[place] LCSW Group Therapy 04/14/2017 1:15pm  Type of Therapy: Group Therapy- Feelings Around Relapse and Recovery  Pt did not attend, declined invitation. Pt was observed to be manic and paranoid.  Adriana Reams, LCSW [phone] 04/14/2017 3:44 PM

## 2017-04-14 NOTE — Progress Notes (Signed)
[facility] MD Progress Note  04/14/2017 11:17 AM Robert Fowler  MRN:  [id number] Subjective:  "I have had a lot of experiences today and one is homophobia. "I am pretty sure a guy in a blue uniform called me a pussy. I don't like prejudice and don't like being called that."  He then states "well he may not have been talking to me." He keeps asking "What do you want to talk about next?"  He reports a very happy mood and really like the facility. Reports that he was singing Dorthy Cooler in [place] last night. Completely denies any SI/HI/AVH, but admits recognizing hypomanic symptoms and agrees to increased Abilify 20 mg PO Daily starting 04-15-17.  Objective: Patient is hyperverbal with pressured speech and tangential conversations. However, he is still very pleasant and attempts conversations with numerous people he comes in contact with. He seems to have improved from being admitted, but feel that he needs more improvement and the increase in the Abilify and possible discharge on 04/17/17 would be beneficial to him.   Principal Problem: Bipolar 1 disorder ([place]) Diagnosis:   Patient Active Problem List   Diagnosis Date Noted  . Hyponatremia [E87.1] 04/12/2017  . Bipolar 1 disorder ([place]) [F31.9] 04/11/2017  . Medicare annual wellness visit, initial [Z00.00] 03/03/2015  . Advanced care planning/counseling discussion [Z71.89] 03/03/2015  . Health maintenance examination [Z00.00] 01/27/2014  . Pes planus [M21.40] 01/11/2008  . Osteoporosis [M81.0] 09/04/2007  . Ex-smoker [Z85.885] 09/04/2007  . Paranoid schizophrenia ([place]) [F20.0] 07/18/2007  . Hematuria [R31.9] 07/18/2007  . Benign prostatic hyperplasia [N40.0] 07/18/2007    Time spent with patient: 25 minutes  Past Psychiatric History: See H&P  Past Medical History:  Past Medical History:  Diagnosis Date  . Arthritis   . BENIGN PROSTATIC HYPERTROPHY 07/18/2007  . Bipolar disorder ([place])   . Habitual alcohol use   . OSTEOPOROSIS 09/04/2007   DEXA  -2.8 lumbar (03/2015)  . Pes planus   . Prediabetes 2014  . Schizophrenia, paranoid ([place])    schizoaffective disorder    Past Surgical History:  Procedure Laterality Date  . INGUINAL HERNIA REPAIR Right 08/2014   Dr Gershon Crane   Family History:  Family History  Problem Relation Age of Onset  . Alcohol abuse Father   . Alcohol abuse Maternal Grandmother   . COPD Mother        smoker  . CAD Neg Hx   . Stroke Neg Hx   . Cancer Neg Hx   . Diabetes Neg Hx   . Hypertension Neg Hx    Family Psychiatric  History: See H&P Social History:  History  Alcohol Use  . 2.4 oz/week  . 4 Glasses of wine per week     History  Drug Use  . Types: Marijuana    Social History   Social History  . Marital status: Single    Spouse name: N/A  . Number of children: N/A  . Years of education: N/A   Social History Main Topics  . Smoking status: Former Smoker    Quit date: 09/26/2006  . Smokeless tobacco: Never Used  . Alcohol use 2.4 oz/week    4 Glasses of wine per week  . Drug use: Yes    Types: Marijuana  . Sexual activity: Not Asked   Other Topics Concern  . None   Social History Narrative   Lives alone.     Mother and brother live nearby.   Occupation: Retail buyer at [employer]   Edu: 12th  grade   Activity: mows yard   Diet: good water, fruits/vegetables daily      Disability evaluation - no disability (04/2014)   Additional Social History:    Pain Medications: pt denies abuse - see pta meds list Prescriptions: pt denies abuse - see pta meds list Over the Counter: pt denies abuse - see pta meds list History of alcohol / drug use?: Yes Name of Substance 1: etoh 1 - Age of First Use: 19 1 - Amount (size/oz): 8 oz glass wine 1 - Frequency: twice a week 1 - Duration: years 1 - Last Use / Amount: unknown  Sleep: Good  Appetite:  Good  Current Medications: Current Facility-Administered Medications  Medication Dose Route Frequency Provider Last Rate Last Dose  .  acetaminophen (TYLENOL) tablet 650 mg  650 mg Oral Q6H PRN Okonkwo, Justina A, NP   650 mg at 04/12/17 2152  . alum & mag hydroxide-simeth (MAALOX/MYLANTA) 200-200-20 MG/5ML suspension 30 mL  30 mL Oral Q4H PRN Okonkwo, Justina A, NP      . [START ON 04/15/2017] ARIPiprazole (ABILIFY) tablet 20 mg  20 mg Oral Daily Jervon Ream, Lowry Ram, FNP      . aspirin chewable tablet 81 mg  81 mg Oral Daily Okonkwo, Justina A, NP   81 mg at 04/14/17 0823  . cholecalciferol (VITAMIN D) tablet 2,000 Units  2,000 Units Oral Daily Okonkwo, Justina A, NP   2,000 Units at 04/14/17 0823  . GRX ANALGESIC BALM OINT 1 application  1 application Apply externally PRN Cobos, Myer Peer, MD      . LORazepam (ATIVAN) tablet 0.5 mg  0.5 mg Oral Q6H PRN Cobos, Myer Peer, MD   0.5 mg at 04/13/17 2208  . magnesium hydroxide (MILK OF MAGNESIA) suspension 30 mL  30 mL Oral Daily PRN Okonkwo, Justina A, NP      . sodium chloride tablet 1 g  1 g Oral BID WC Regalado, Belkys A, MD   1 g at 04/14/17 0823  . tamsulosin (FLOMAX) capsule 0.4 mg  0.4 mg Oral Daily Laverle Hobby, PA-C   0.4 mg at 04/14/17 4235    Lab Results:  Results for orders placed or performed during the hospital encounter of 04/11/17 (from the past 48 hour(s))  Basic metabolic panel     Status: Abnormal   Collection Time: 04/12/17  6:24 PM  Result Value Ref Range   Sodium 120 (L) 135 - 145 mmol/L   Potassium 3.7 3.5 - 5.1 mmol/L   Chloride 89 (L) 101 - 111 mmol/L   CO2 22 22 - 32 mmol/L   Glucose, Bld 147 (H) 65 - 99 mg/dL   BUN 9 6 - 20 mg/dL   Creatinine, Ser 0.73 0.61 - 1.24 mg/dL   Calcium 9.0 8.9 - 10.3 mg/dL   GFR calc non Af Amer >60 >60 mL/min   GFR calc Af Amer >60 >60 mL/min    Comment: (NOTE) The eGFR has been calculated using the CKD EPI equation. This calculation has not been validated in all clinical situations. eGFR's persistently <60 mL/min signify possible Chronic Kidney Disease.    Anion gap 9 5 - 15    Comment: Performed at [facility], [place] [address], [place], [place] [address]  Basic metabolic panel     Status: Abnormal   Collection Time: 04/13/17  7:10 AM  Result Value Ref Range   Sodium 123 (L) 135 - 145 mmol/L   Potassium 3.9 3.5 -  5.1 mmol/L   Chloride 91 (L) 101 - 111 mmol/L   CO2 24 22 - 32 mmol/L   Glucose, Bld 129 (H) 65 - 99 mg/dL   BUN 8 6 - 20 mg/dL   Creatinine, Ser 0.64 0.61 - 1.24 mg/dL   Calcium 8.9 8.9 - 10.3 mg/dL   GFR calc non Af Amer >60 >60 mL/min   GFR calc Af Amer >60 >60 mL/min    Comment: (NOTE) The eGFR has been calculated using the CKD EPI equation. This calculation has not been validated in all clinical situations. eGFR's persistently <60 mL/min signify possible Chronic Kidney Disease.    Anion gap 8 5 - 15    Comment: Performed at [facility], [place] [address], [place], [place] [address]  Sodium, urine, random     Status: None   Collection Time: 04/13/17  1:37 PM  Result Value Ref Range   Sodium, Ur 16 mmol/L    Comment: Performed at [place] [address], [place], [place] [address]  Osmolality, urine     Status: Abnormal   Collection Time: 04/13/17  1:37 PM  Result Value Ref Range   Osmolality, Ur 156 (L) 300 - 900 mOsm/kg    Comment: Performed at [place] [address], [place], [place] [address]  TSH     Status: None   Collection Time: 04/13/17  6:24 PM  Result Value Ref Range   TSH 1.602 0.350 - 4.500 uIU/mL    Comment: Performed by a 3rd Generation assay with a functional sensitivity of <=0.01 uIU/mL. Performed at [facility], [place] [address], [place], [place] [address]     Blood Alcohol level:  Lab Results  Component Value Date   ETH <5 03/49/1791    Metabolic Disorder Labs: Lab Results  Component Value Date   HGBA1C 5.3 01/20/2014   No results found for: PROLACTIN Lab Results  Component Value Date   CHOL 181 02/26/2015   TRIG 64.0 02/26/2015   HDL 80.50 02/26/2015    CHOLHDL 2 02/26/2015   VLDL 12.8 02/26/2015   LDLCALC 88 02/26/2015   LDLCALC 78 07/23/2013    Physical Findings: AIMS: Facial and Oral Movements Muscles of Facial Expression: None, normal Lips and Perioral Area: None, normal Jaw: None, normal Tongue: None, normal,Extremity Movements Upper (arms, wrists, hands, fingers): None, normal Lower (legs, knees, ankles, toes): None, normal, Trunk Movements Neck, shoulders, hips: None, normal, Overall Severity Severity of abnormal movements (highest score from questions above): None, normal Incapacitation due to abnormal movements: None, normal Patient's awareness of abnormal movements (rate only patient's report): No Awareness, Dental Status Current problems with teeth and/or dentures?: No Does patient usually wear dentures?: No  CIWA:    COWS:     Musculoskeletal: Strength & Muscle Tone: within normal limits Gait & Station: normal Patient leans: N/A  Psychiatric Specialty Exam: Physical Exam  Nursing note and vitals reviewed. Constitutional: He is oriented to person, place, and time. He appears well-developed and well-nourished.  Eyes: Pupils are equal, round, and reactive to light.  Cardiovascular: Normal rate.   Respiratory: Effort normal.  Musculoskeletal: Normal range of motion.  Neurological: He is alert and oriented to person, place, and time.    Review of Systems  Constitutional: Negative.   HENT: Negative.   Eyes: Negative.   Respiratory: Negative.   Cardiovascular: Negative.   Gastrointestinal: Negative.   Genitourinary: Negative.   Musculoskeletal: Negative.   Skin: Negative.   Neurological:  Negative.   Endo/Heme/Allergies: Negative.     Blood pressure 136/90, pulse 80, temperature 97.8 F (36.6 C), temperature source Oral, resp. rate 20, height _0  (1.753 m), weight 84.4 kg (186 lb), SpO2 100 %.Body mass index is 27.47 kg/m.  General Appearance: Casual and Fairly Groomed  Eye Contact:  Good  Speech:  Clear  and Coherent and Pressured  Volume:  Normal  Mood:  Euphoric  Affect:  Appropriate  Thought Process:  Irrelevant and Descriptions of Associations: Tangential  Orientation:  Full (Time, Place, and Person)  Thought Content:  Illogical, Rumination and Tangential  Suicidal Thoughts:  No  Homicidal Thoughts:  No  Memory:  Immediate;   Good Recent;   Good Remote;   Good  Judgement:  Fair  Insight:  Fair  Psychomotor Activity:  Normal  Concentration:  Concentration: Fair and Attention Span: Good  Recall:  Good  Fund of Knowledge:  Fair  Language:  Good  Akathisia:  No  Handed:  Right  AIMS (if indicated):     Assets:  Desire for Improvement Housing Social Support  ADL's:  Intact  Cognition:  WNL  Sleep:  Number of Hours: 4     Treatment Plan Summary: Daily contact with patient to assess and evaluate symptoms and progress in treatment, Medication management and Plan is to:  -Increase Abilify 20 mg PO Daily starting 04-15-17 -Encourage continued group therapy and participation in activities -Discharge planning discussion and medication compliance and follow-up appointments  Lewis Shock, FNP 04/14/2017, 11:17 AM

## 2017-04-14 NOTE — Progress Notes (Signed)
D- Patient is pleasant. Patient denies SI/HI/AVH. Patient verbalizes feelings of paranoia making statements such as "milk isn't poison is it", "someone might blow up the building. How will I be protected", and "I got two cafeteria workers arrested and taken to jail because they tried to poison me today".  Patient has been religiously occuppied today; quoting scripture, discussing various stories from the bible, and verbalizing fear of going to hell.  NP and Psychiatrist notified of patient's change in mental status.  Orders placed and followed.  See MAR.  MD from [facility], assessed patient today for his hyponatremia and recommended water restriction and ordered labs to be drawn. No further complaints. A- Scheduled medications administered to patient, per MD orders. Support and encouragement provided.  Routine safety checks conducted every 15 minutes.  Patient informed to notify staff with problems or concerns. R- Patient contracts for safety at this time. Patient remains safe at this time.

## 2017-04-15 LAB — BASIC METABOLIC PANEL
ANION GAP: 8 (ref 5–15)
BUN: 8 mg/dL (ref 6–20)
CHLORIDE: 96 mmol/L — AB (ref 101–111)
CO2: 26 mmol/L (ref 22–32)
Calcium: 9.2 mg/dL (ref 8.9–10.3)
Creatinine, Ser: 0.76 mg/dL (ref 0.61–1.24)
Glucose, Bld: 174 mg/dL — ABNORMAL HIGH (ref 65–99)
POTASSIUM: 3.9 mmol/L (ref 3.5–5.1)
SODIUM: 130 mmol/L — AB (ref 135–145)

## 2017-04-15 MED ORDER — HALOPERIDOL 2 MG PO TABS
2.0000 mg | ORAL_TABLET | Freq: Two times a day (BID) | ORAL | Status: DC
  Administered 2017-04-15: 2 mg via ORAL
  Filled 2017-04-15 (×4): qty 1

## 2017-04-15 MED ORDER — LORAZEPAM 1 MG PO TABS
1.0000 mg | ORAL_TABLET | Freq: Three times a day (TID) | ORAL | Status: DC
  Administered 2017-04-15 – 2017-04-17 (×7): 1 mg via ORAL
  Filled 2017-04-15 (×6): qty 1

## 2017-04-15 MED ORDER — LORAZEPAM 1 MG PO TABS
1.0000 mg | ORAL_TABLET | Freq: Once | ORAL | Status: AC
  Administered 2017-04-15: 1 mg via ORAL
  Filled 2017-04-15: qty 1

## 2017-04-15 MED ORDER — OLANZAPINE 5 MG PO TBDP
5.0000 mg | ORAL_TABLET | Freq: Two times a day (BID) | ORAL | Status: DC
  Administered 2017-04-15: 5 mg via ORAL
  Filled 2017-04-15 (×6): qty 1

## 2017-04-15 MED ORDER — OLANZAPINE 5 MG PO TBDP
5.0000 mg | ORAL_TABLET | Freq: Two times a day (BID) | ORAL | Status: DC | PRN
  Administered 2017-04-16: 5 mg via ORAL
  Filled 2017-04-15: qty 1

## 2017-04-15 MED ORDER — LORAZEPAM 1 MG PO TABS
1.0000 mg | ORAL_TABLET | Freq: Three times a day (TID) | ORAL | Status: DC | PRN
  Administered 2017-04-16 – 2017-04-17 (×2): 1 mg via ORAL
  Filled 2017-04-15 (×2): qty 1

## 2017-04-15 NOTE — Progress Notes (Signed)
[facility] MD Progress Note  04/15/2017 10:53 AM Robb Sibal  MRN:  [id number]  Subjective:  I feel great! NO more pain No more anxiety. I done transformed to the  [place] body. I will see you again.   Per nursing: Patient is pleasant. Patient denies SI/HI/AVH. Patient verbalizes feelings of paranoia making statements such as "milk isn't poison is it", "someone might blow up the building. How will I be protected", and "I got two cafeteria workers arrested and taken to jail because they tried to poison me today".  Patient has been religiously occuppied today; quoting scripture, discussing various stories from the bible, and verbalizing fear of going to hell.  NP and Psychiatrist notified of patient's change in mental status.  Orders placed and followed.  See MAR.  MD from [facility], assessed patient today for his hyponatremia and recommended water restriction and ordered labs to be drawn. No further complaints. Scheduled medications administered to patient, per MD orders. Support and encouragement provided.  Routine safety checks conducted every 15 minutes.  Patient informed to notify staff with problems or concerns. Patient contracts for safety at this time. Patient remains safe at this time.  Objective: During todays evaluation the patient is observed very restless with increased agitation. He currently is alert and oriented x 3, calm and cooperative however he is actively psychotic. He is hyperverbal and at time is short of breath from excessive talking. He continues to ruminate about politics and is religiously preoccupied. He appears to be worsening at this time, although he has been redirectable the entire day. Unable to assess sleep hygiene and dietary intake as patient is to restless to remain still. He currently denies si/hi/avh. WIll continue to montior.    Principal Problem: Bipolar 1 disorder ([place]) Diagnosis:   Patient Active Problem List   Diagnosis Date Noted  . Hyponatremia [E87.1]  04/12/2017  . Bipolar 1 disorder ([place]) [F31.9] 04/11/2017  . Medicare annual wellness visit, initial [Z00.00] 03/03/2015  . Advanced care planning/counseling discussion [Z71.89] 03/03/2015  . Health maintenance examination [Z00.00] 01/27/2014  . Pes planus [M21.40] 01/11/2008  . Osteoporosis [M81.0] 09/04/2007  . Ex-smoker [U44.034] 09/04/2007  . Paranoid schizophrenia ([place]) [F20.0] 07/18/2007  . Hematuria [R31.9] 07/18/2007  . Benign prostatic hyperplasia [N40.0] 07/18/2007    Time spent with patient: 25 minutes  Past Psychiatric History: See H&P  Past Medical History:  Past Medical History:  Diagnosis Date  . Arthritis   . BENIGN PROSTATIC HYPERTROPHY 07/18/2007  . Bipolar disorder ([place])   . Habitual alcohol use   . OSTEOPOROSIS 09/04/2007   DEXA -2.8 lumbar (03/2015)  . Pes planus   . Prediabetes 2014  . Schizophrenia, paranoid ([place])    schizoaffective disorder    Past Surgical History:  Procedure Laterality Date  . INGUINAL HERNIA REPAIR Right 08/2014   Dr Gershon Crane   Family History:  Family History  Problem Relation Age of Onset  . Alcohol abuse Father   . Alcohol abuse Maternal Grandmother   . COPD Mother        smoker  . CAD Neg Hx   . Stroke Neg Hx   . Cancer Neg Hx   . Diabetes Neg Hx   . Hypertension Neg Hx    Family Psychiatric  History: See H&P Social History:  History  Alcohol Use  . 2.4 oz/week  . 4 Glasses of wine per week     History  Drug Use  . Types: Marijuana    Social History  Social History  . Marital status: Single    Spouse name: N/A  . Number of children: N/A  . Years of education: N/A   Social History Main Topics  . Smoking status: Former Smoker    Quit date: 09/26/2006  . Smokeless tobacco: Never Used  . Alcohol use 2.4 oz/week    4 Glasses of wine per week  . Drug use: Yes    Types: Marijuana  . Sexual activity: Not Asked   Other Topics Concern  . None   Social History Narrative   Lives alone.     Mother and  brother live nearby.   Occupation: Retail buyer at [employer]   Edu: 12th grade   Activity: mows yard   Diet: good water, fruits/vegetables daily      Disability evaluation - no disability (04/2014)   Additional Social History:    Pain Medications: pt denies abuse - see pta meds list Prescriptions: pt denies abuse - see pta meds list Over the Counter: pt denies abuse - see pta meds list History of alcohol / drug use?: Yes Name of Substance 1: etoh 1 - Age of First Use: 19 1 - Amount (size/oz): 8 oz glass wine 1 - Frequency: twice a week 1 - Duration: years 1 - Last Use / Amount: unknown  Sleep: Good  Appetite:  Good  Current Medications: Current Facility-Administered Medications  Medication Dose Route Frequency Provider Last Rate Last Dose  . acetaminophen (TYLENOL) tablet 650 mg  650 mg Oral Q6H PRN Okonkwo, Justina A, NP   650 mg at 04/12/17 2152  . alum & mag hydroxide-simeth (MAALOX/MYLANTA) 200-200-20 MG/5ML suspension 30 mL  30 mL Oral Q4H PRN Okonkwo, Justina A, NP      . ARIPiprazole (ABILIFY) tablet 20 mg  20 mg Oral Daily Money, Travis B, FNP   20 mg at 04/15/17 0810  . aspirin chewable tablet 81 mg  81 mg Oral Daily Okonkwo, Justina A, NP   81 mg at 04/15/17 0810  . cholecalciferol (VITAMIN D) tablet 2,000 Units  2,000 Units Oral Daily Okonkwo, Justina A, NP   2,000 Units at 04/15/17 0810  . GRX ANALGESIC BALM OINT 1 application  1 application Apply externally PRN Cobos, Myer Peer, MD      . LORazepam (ATIVAN) tablet 0.5 mg  0.5 mg Oral Q6H PRN Cobos, Myer Peer, MD   0.5 mg at 04/14/17 2309  . magnesium hydroxide (MILK OF MAGNESIA) suspension 30 mL  30 mL Oral Daily PRN Okonkwo, Justina A, NP      . sodium chloride tablet 1 g  1 g Oral BID WC Regalado, Belkys A, MD   1 g at 04/15/17 0810  . tamsulosin (FLOMAX) capsule 0.4 mg  0.4 mg Oral Daily Laverle Hobby, PA-C   0.4 mg at 04/15/17 7893    Lab Results:  Results for orders placed or performed during the hospital  encounter of 04/11/17 (from the past 48 hour(s))  Sodium, urine, random     Status: None   Collection Time: 04/13/17  1:37 PM  Result Value Ref Range   Sodium, Ur 16 mmol/L    Comment: Performed at [facility], [address], [place], [place] [address]  Osmolality, urine     Status: Abnormal   Collection Time: 04/13/17  1:37 PM  Result Value Ref Range   Osmolality, Ur 156 (L) 300 - 900 mOsm/kg    Comment: Performed at [place] [address],  [place], [place] [address]  TSH     Status: None   Collection Time: 04/13/17  6:24 PM  Result Value Ref Range   TSH 1.602 0.350 - 4.500 uIU/mL    Comment: Performed by a 3rd Generation assay with a functional sensitivity of <=0.01 uIU/mL. Performed at [facility], [place] [address], [place], [place] [address]   Basic metabolic panel     Status: Abnormal   Collection Time: 04/14/17  6:17 PM  Result Value Ref Range   Sodium 127 (L) 135 - 145 mmol/L   Potassium 3.6 3.5 - 5.1 mmol/L   Chloride 93 (L) 101 - 111 mmol/L   CO2 26 22 - 32 mmol/L   Glucose, Bld 151 (H) 65 - 99 mg/dL   BUN 7 6 - 20 mg/dL   Creatinine, Ser 0.75 0.61 - 1.24 mg/dL   Calcium 9.1 8.9 - 10.3 mg/dL   GFR calc non Af Amer >60 >60 mL/min   GFR calc Af Amer >60 >60 mL/min    Comment: (NOTE) The eGFR has been calculated using the CKD EPI equation. This calculation has not been validated in all clinical situations. eGFR's persistently <60 mL/min signify possible Chronic Kidney Disease.    Anion gap 8 5 - 15    Comment: Performed at [facility], [place] [address], [place], [place] [address]    Blood Alcohol level:  Lab Results  Component Value Date   ETH <5 86/57/8469    Metabolic Disorder Labs: Lab Results  Component Value Date   HGBA1C 5.3 01/20/2014   No results found for: PROLACTIN Lab Results  Component Value Date   CHOL 181 02/26/2015   TRIG 64.0 02/26/2015   HDL 80.50 02/26/2015   CHOLHDL 2 02/26/2015    VLDL 12.8 02/26/2015   LDLCALC 88 02/26/2015   LDLCALC 78 07/23/2013    Physical Findings: AIMS: Facial and Oral Movements Muscles of Facial Expression: None, normal Lips and Perioral Area: None, normal Jaw: None, normal Tongue: None, normal,Extremity Movements Upper (arms, wrists, hands, fingers): None, normal Lower (legs, knees, ankles, toes): None, normal, Trunk Movements Neck, shoulders, hips: None, normal, Overall Severity Severity of abnormal movements (highest score from questions above): None, normal Incapacitation due to abnormal movements: None, normal Patient's awareness of abnormal movements (rate only patient's report): No Awareness, Dental Status Current problems with teeth and/or dentures?: No Does patient usually wear dentures?: No  CIWA:    COWS:     Musculoskeletal: Strength & Muscle Tone: within normal limits Gait & Station: normal Patient leans: N/A  Psychiatric Specialty Exam: Physical Exam  Nursing note and vitals reviewed. Constitutional: He is oriented to person, place, and time. He appears well-developed and well-nourished.  Eyes: Pupils are equal, round, and reactive to light.  Cardiovascular: Normal rate.   Respiratory: Effort normal.  Musculoskeletal: Normal range of motion.  Neurological: He is alert and oriented to person, place, and time.    Review of Systems  Constitutional: Negative.   HENT: Negative.   Eyes: Negative.   Respiratory: Negative.   Cardiovascular: Negative.   Gastrointestinal: Negative.   Genitourinary: Negative.   Musculoskeletal: Negative.   Skin: Negative.   Neurological: Negative.   Endo/Heme/Allergies: Negative.     Blood pressure (!) 143/86, pulse 76, temperature 97.7 F (36.5 C), temperature source Oral, resp. rate 20, height _0  (1.753 m), weight 84.4 kg (186 lb), SpO2 100 %.Body mass index is 27.47 kg/m.  General Appearance: Casual and Fairly Groomed  Eye Contact:  Good  Speech:  Clear and Coherent and  Pressured  Volume:  Normal  Mood:  Euphoric and blessed and great  Affect:  Full Range  Thought Process:  Irrelevant and Descriptions of Associations: Loose  Orientation:  Full (Time, Place, and Person)  Thought Content:  Illogical, Rumination and Tangential  Suicidal Thoughts:  No  Homicidal Thoughts:  No  Memory:  Immediate;   Fair Recent;   Fair Remote;   Fair  Judgement:  Impaired  Insight:  Lacking  Psychomotor Activity:  Decreased and Restlessness  Concentration:  Concentration: Fair and Attention Span: Good  Recall:  Good  Fund of Knowledge:  Fair  Language:  Good  Akathisia:  No  Handed:  Right  AIMS (if indicated):     Assets:  Desire for Improvement Housing Social Support  ADL's:  Intact  Cognition:  WNL  Sleep:  Number of Hours: 1     Treatment Plan Summary: Daily contact with patient to assess and evaluate symptoms and progress in treatment, Medication management and Plan is to:  -WIll dc Abilify 20 mg, patient appears to be worsening unclear if this is due medication treatment failure with Abilify or if medically for hyponatremia. He was on Depakote prior to admission yet was discontinued due to Hyponatremia. He has low urine sodium, and decreased urine osmolality.  Serum sodium levels have increased from 120 to 127 in 3 days, will continue to try slow increase of sodium replacement with oral supplementation. He is currently on a fluid restriction diet to include Gatorade. Will add food and drink log at this time, may not be as beneficial since he is at [facility] and is not being observed continuously. All staff have been observed to limit his intake at this time. Due to the inability to enforce water restrictions will place on 1:1 for safety with goal to enforce fluid restrictions to help with sodium levels.  Will add Ativan 40m po TID scheduled at this time. One dose of Haldol was administered for negative psychosis and paranoia. We will also focus on getting him adequate  sleep. IF sodium levels continue to remain low despite parameters above will consult [facility].  -Encourage continued group therapy and participation in activities -Discharge planning discussion and medication compliance and follow-up appointments - TNanci Pina FNP 04/15/2017, 10:53 AM

## 2017-04-15 NOTE — BHH Group Notes (Signed)
Adult Therapy Group Note  Date:  04/15/2017  Time:  10:00-11:00AM  Group Topic/Focus: Unhealthy vs Healthy Coping Techniques  Building Self Esteem:    The focus of this group was to determine what unhealthy coping techniques typically are used or and what healthy coping techniques would be helpful in coping with various problems. Patients were guided in becoming aware of the differences between healthy and unhealthy coping techniques.  Vignettes were used to generate ideas, which led to a discussion about personal application.     Participation Level:  Active  Participation Quality:  Intrusive and Monopolizing  Affect:  Excited  Cognitive:  Disorganized and Confused and Delusional  Insight: None  Engagement in Group:  Off Topic  Modes of Intervention:  Exercise, Discussion and Support  Additional Comments:  The patient expressed himself freely throughout group, but was almost continually off topic and delusional.  He was difficult to redirect.  Three other patients visibly got upset and eventually left the room.  CSW stopped him from talking for the last 15 minutes of group because so many of his comments were off topic and inappropriate, one comment in particular very upsetting for the females in the group due to its sexual nature.  He left the room at one point and was told by CSW not to return.  Selmer Dominion, LCSW 04/15/2017   12:44 PM

## 2017-04-15 NOTE — Progress Notes (Addendum)
1:1 D Pt observed sitting on the side of his bed with his hands slid underneath both of his upper legs. HE is wisteling to himslef as much as he can and he is smiling as he does this . He slept after receiving po haldol with ativan and has not experienced any pacing / manic episodes since receiving po ativan. A he cont to experieince flight of ideas with disorganized speecha dn thought. RSafety in  Place. Cont to maintain therapeutic relationship.

## 2017-04-15 NOTE — Progress Notes (Signed)
PROGRESS NOTE    Robert Fowler  ZYS:[id number] DOB: 1951-09-14 DOA: 04/11/2017 PCP: [name], MD    Brief Narrative: 66 year old man currently admitted to behavioral [place] for bipolar disorder with mania who is noted to have hyponatremia, asymptomatic. Consultation was requested for treatment recommendations in regard to hyponatremia.  Patient reports he's been told in the past these had low sodium. He reports he drinks at least 6 bottles of water a day. Food intake may be limited although not clear from history. He reports some dizziness but no nausea or vomiting or other complaints at this point. No specific aggravating or alleviating factors.    Assessment & Plan:   Principal Problem:   Bipolar 1 disorder ([place]) Active Problems:   Benign prostatic hyperplasia   Hyponatremia  1-Hyponatremia; likely related to primary polydipsia.  Reviewed prior records, patient with chronic hyponatremia, sodium 130 range.  Agree with stopping lisinopril and trazodone.  Discussed with nurse, patient needs to follow water restriction.  Would continue with sodium tablet. Water restrictions.  Sodium increasing 120--123--127. Improving appropriatly.  TSH; normal.  urine sodium 16, urine osmolality 156---consisting with primary polydipsia.  Labs pending for today.  Lab order for the morning.   2 Schizophrenia, bipolar, per psychiatry.  3 BPH. -Agree with empiric Flomax  Subjective: Pressure speech, Talking about that he has a plan, he will meet with putin   Objective: Vitals:   04/14/17 0828 04/14/17 0829 04/15/17 0600 04/15/17 0602  BP: (!) 148/82 136/90 135/83 (!) 143/86  Pulse: 71 80 73 76  Resp: 20  20   Temp: 97.8 F (36.6 C)  97.7 F (36.5 C)   TempSrc: Oral  Oral   SpO2:      Weight:      Height:       No intake or output data in the 24 hours ending 04/15/17 1430 Filed Weights   04/11/17 1700  Weight: 84.4 kg (186 lb)    Examination:  General exam: No  acute distress Respiratory system: CTA  Cardiovascular system: S 1, S 2 RRR Gastrointestinal system:BS present, soft, nt Central nervous system: alert,  Extremities: symmetric power.  Skin: no rashes, no ulcer.      Data Reviewed: I have personally reviewed following labs and imaging studies  CBC:  Recent Labs Lab 04/11/17 1034  WBC 6.4  HGB 15.9  HCT 42.0  MCV 80.9  PLT 932   Basic Metabolic Panel:  Recent Labs Lab 04/11/17 1034 04/12/17 1824 04/13/17 0710 04/14/17 1817  NA 122* 120* 123* 127*  K 3.8 3.7 3.9 3.6  CL 89* 89* 91* 93*  CO2 22 22 24 26   GLUCOSE 116* 147* 129* 151*  BUN <5* 9 8 7   CREATININE 0.57* 0.73 0.64 0.75  CALCIUM 9.5 9.0 8.9 9.1   GFR: Estimated Creatinine Clearance: 92.1 mL/min (by C-G formula based on SCr of 0.75 mg/dL). Liver Function Tests:  Recent Labs Lab 04/11/17 1034  AST 20  ALT 24  ALKPHOS 73  BILITOT 2.0*  PROT 7.9  ALBUMIN 4.6   No results for input(s): LIPASE, AMYLASE in the last 168 hours. No results for input(s): AMMONIA in the last 168 hours. Coagulation Profile: No results for input(s): INR, PROTIME in the last 168 hours. Cardiac Enzymes: No results for input(s): CKTOTAL, CKMB, CKMBINDEX, TROPONINI in the last 168 hours. BNP (last 3 results) No results for input(s): PROBNP in the last 8760 hours. HbA1C: No results for input(s): HGBA1C in the last 72 hours. CBG: No  results for input(s): GLUCAP in the last 168 hours. Lipid Profile: No results for input(s): CHOL, HDL, LDLCALC, TRIG, CHOLHDL, LDLDIRECT in the last 72 hours. Thyroid Function Tests:  Recent Labs  04/13/17 1824  TSH 1.602   Anemia Panel: No results for input(s): VITAMINB12, FOLATE, FERRITIN, TIBC, IRON, RETICCTPCT in the last 72 hours. Sepsis Labs: No results for input(s): PROCALCITON, LATICACIDVEN in the last 168 hours.  No results found for this or any previous visit (from the past 240 hour(s)).       Radiology Studies: No results  found.      Scheduled Meds: . aspirin  81 mg Oral Daily  . cholecalciferol  2,000 Units Oral Daily  . LORazepam  1 mg Oral TID  . OLANZapine zydis  5 mg Oral BID  . sodium chloride  1 g Oral BID WC  . tamsulosin  0.4 mg Oral Daily   Continuous Infusions:   LOS: 4 days    Time spent: 25 minutes,.     Elmarie Shiley, MD [facility] Pager [phone]  If 7PM-7AM, please contact night-coverage [email or web address] Password [email or web address] 04/15/2017, 2:30 PM

## 2017-04-16 DIAGNOSIS — R443 Hallucinations, unspecified: Secondary | ICD-10-CM

## 2017-04-16 LAB — BASIC METABOLIC PANEL
ANION GAP: 6 (ref 5–15)
BUN: 9 mg/dL (ref 6–20)
CHLORIDE: 100 mmol/L — AB (ref 101–111)
CO2: 27 mmol/L (ref 22–32)
Calcium: 9.4 mg/dL (ref 8.9–10.3)
Creatinine, Ser: 0.68 mg/dL (ref 0.61–1.24)
GFR calc non Af Amer: >60 mL/min (ref >60)
Glucose, Bld: 99 mg/dL (ref 65–99)
POTASSIUM: 4 mmol/L (ref 3.5–5.1)
Sodium: 133 mmol/L — ABNORMAL LOW (ref 135–145)

## 2017-04-16 MED ORDER — SODIUM CHLORIDE 1 G PO TABS
1.0000 g | ORAL_TABLET | Freq: Every day | ORAL | Status: DC
  Administered 2017-04-17 – 2017-04-19 (×3): 1 g via ORAL
  Filled 2017-04-16 (×6): qty 1

## 2017-04-16 MED ORDER — OLANZAPINE 10 MG PO TBDP
10.0000 mg | ORAL_TABLET | Freq: Every day | ORAL | Status: DC
  Administered 2017-04-16 – 2017-04-18 (×3): 10 mg via ORAL
  Filled 2017-04-16 (×6): qty 1

## 2017-04-16 MED ORDER — OLANZAPINE 5 MG PO TBDP
5.0000 mg | ORAL_TABLET | Freq: Every day | ORAL | Status: DC
  Administered 2017-04-17 – 2017-04-19 (×3): 5 mg via ORAL
  Filled 2017-04-16 (×5): qty 1

## 2017-04-16 NOTE — Progress Notes (Signed)
Adult Psychoeducational Group Note  Date:  04/16/2017 Time:  8:37 PM  Group Topic/Focus:  Wrap-Up Group:   The focus of this group is to help patients review their daily goal of treatment and discuss progress on daily workbooks.  Participation Level:  Active  Participation Quality:  Appropriate, Attentive, Sharing and Supportive  Affect:  Appropriate  Cognitive:  Appropriate  Insight: Appropriate and Good  Engagement in Group:  Developing/Improving  Modes of Intervention:  Discussion, Education, Socialization and Support  Additional Comments:  Pt shared during group his day has been "up and down" due to his sodium level from blood work and that his fluid intake has decreased and it is stressful for him. Pt shared that his goal was to not be scared and to ask for help.  Abe People Brittini 04/16/2017, 8:37 PM

## 2017-04-16 NOTE — Plan of Care (Signed)
Problem: Safety: Goal: Ability to remain free from injury will improve Outcome: Progressing Patient has not engaged in self harm behavior and denies SI

## 2017-04-16 NOTE — Progress Notes (Addendum)
1:1 note:  D: Pt denies SI/HI. Pt is pleasant and cooperative. Pt observed in room with 1:1 sitter at bedside. Patient rambling in speech communicating about religion.  A: Pt was offered support and encouragement. Pt was given scheduled medications. Q 15 minute checks were done for safety. Patient remains with 1:1 within arms length for safety and at bedside. R:Pt did not attend group and interacts well with staff. Pt is taking medication. Pt has no complaints.Pt receptive to treatment and safety maintained on unit.

## 2017-04-16 NOTE — Progress Notes (Addendum)
1:1 note   D Pt observed at medication window. He says " is that pill going to make me sleepy.Marland Kitchenibuprofen don't want to sleep". Pt encouraged to take med but also to speak with doctor tomorrow about possibly decreasing frequency ( so sedation will decrease as well). A Pt speaking quietly with 1:1 MHT. He remains disorganized, delusional and hypomanic. R Safety in place. Staff limiting fluid intake and po intake monitored. Pt allowed fluids with his meals and minimal water at medication time.

## 2017-04-16 NOTE — Progress Notes (Signed)
Patient not seen. Follow up on labs.   1-Hyponatremia; likely primary polydipsia.  Improved over days , slowly to baseline. Sodium 130- Will change sodium tablet to daily. Continue with water restriction.  Monitors sodium.  I will sign off, please call with questions.   Bel;kys Regalado.

## 2017-04-16 NOTE — Progress Notes (Signed)
1:1 for 1400   D Pt is observed talking quietly to MHT sitter. He is still...sitting in his chair. He says " I want something to drink". A Writer gave pt cup with 30 cc water in it. NA 133 today and int med changed sodium tablets to QD. Pt less manic and inappropriate in his behaviors today . He remains disorganized but he is easier to redirect. R Safety in place.

## 2017-04-16 NOTE — Progress Notes (Signed)
1:1  D Pt is seen sitting in the dayroom quietly watching TV. A He completes his daily assessment and on this he writes he denies SI today and he rates his depression, hopelessness and anxiety " 0/0/0", respectively. His speech remains somewhat disorganized, but much much less compared to yesterday. HE is not pacing and he is able to sit still. R 1:1 cont per MD order and safety in place.

## 2017-04-16 NOTE — BHH Group Notes (Signed)
[facility] Group Notes:  (Nursing/MHT/Case Management/Adjunct)  Date:  04/16/2017  Time:  4:44 PM  Type of Therapy:  Nurse Education  Participation Level:  Active  Participation Quality:  Appropriate  Affect:  Appropriate  Cognitive:  Appropriate  Insight:  Lacking  Engagement in Group:  Engaged  Modes of Intervention:  Activity  Summary of Progress/Problems:  Group was a positive affirmation group focusing on giving positive affirmation to others and receiving it, without contradiction, for self.  Cheri Kearns 04/16/2017, 4:44 PM

## 2017-04-16 NOTE — Progress Notes (Signed)
Nursing 1:1 note D:Pt observed sleeping in bed with eyes closed. RR even and unlabored. No distress noted. A: 1:1 observation continues for safety  R: Pt remains safe  

## 2017-04-16 NOTE — Progress Notes (Signed)
Nursing 1:1 note D:Pt observed sleeping in bed with eyes closed. RR even and unlabored. No distress noted. A: 1:1 observation continues for safety  R: pt remains safe  

## 2017-04-16 NOTE — Progress Notes (Signed)
[facility] MD Progress Note  04/16/2017 12:57 PM Robert Fowler  MRN:  [id number] Subjective:   Patient seen, chart reviewed and case discussed with nursing staff.   Patient is sitting in a bed, reading bible out loud. He feels "calmer" today. He asks why he cannot drink more, and reports his understanding about its rationale. He remembers he was pacing in the hall yesterday, but reports he is "relaxed" now. He denies feeling depressed. He denies SI, HI, AH. He has VH of "sailboats." He denies insomnia/decreased need for sleep. He denies anxiety. He denies euphoria. (Later checking in the room, he continues to read bible out loud in his room).  Principal Problem: Bipolar 1 disorder ([place]) Diagnosis:   Patient Active Problem List   Diagnosis Date Noted  . Hyponatremia [E87.1] 04/12/2017  . Bipolar 1 disorder ([place]) [F31.9] 04/11/2017  . Medicare annual wellness visit, initial [Z00.00] 03/03/2015  . Advanced care planning/counseling discussion [Z71.89] 03/03/2015  . Health maintenance examination [Z00.00] 01/27/2014  . Pes planus [M21.40] 01/11/2008  . Osteoporosis [M81.0] 09/04/2007  . Ex-smoker [C14.481] 09/04/2007  . Hematuria [R31.9] 07/18/2007  . Benign prostatic hyperplasia [N40.0] 07/18/2007   Total Time spent with patient: 20 minutes  Past Psychiatric History: see HPI  Past Medical History:  Past Medical History:  Diagnosis Date  . Arthritis   . BENIGN PROSTATIC HYPERTROPHY 07/18/2007  . Bipolar disorder ([place])   . Habitual alcohol use   . OSTEOPOROSIS 09/04/2007   DEXA -2.8 lumbar (03/2015)  . Pes planus   . Prediabetes 2014  . Schizophrenia, paranoid ([place])    schizoaffective disorder    Past Surgical History:  Procedure Laterality Date  . INGUINAL HERNIA REPAIR Right 08/2014   Dr Gershon Crane   Family History:  Family History  Problem Relation Age of Onset  . Alcohol abuse Father   . Alcohol abuse Maternal Grandmother   . COPD Mother        smoker  . CAD Neg Hx   . Stroke Neg  Hx   . Cancer Neg Hx   . Diabetes Neg Hx   . Hypertension Neg Hx    Family Psychiatric  History: see HPI Social History:  History  Alcohol Use  . 2.4 oz/week  . 4 Glasses of wine per week     History  Drug Use  . Types: Marijuana    Social History   Social History  . Marital status: Single    Spouse name: N/A  . Number of children: N/A  . Years of education: N/A   Social History Main Topics  . Smoking status: Former Smoker    Quit date: 09/26/2006  . Smokeless tobacco: Never Used  . Alcohol use 2.4 oz/week    4 Glasses of wine per week  . Drug use: Yes    Types: Marijuana  . Sexual activity: Not Asked   Other Topics Concern  . None   Social History Narrative   Lives alone.     Mother and brother live nearby.   Occupation: Retail buyer at [employer]   Edu: 12th grade   Activity: mows yard   Diet: good water, fruits/vegetables daily      Disability evaluation - no disability (04/2014)   Additional Social History:    Pain Medications: pt denies abuse - see pta meds list Prescriptions: pt denies abuse - see pta meds list Over the Counter: pt denies abuse - see pta meds list History of alcohol / drug use?: Yes Name of Substance 1:  etoh 1 - Age of First Use: 19 1 - Amount (size/oz): 8 oz glass wine 1 - Frequency: twice a week 1 - Duration: years 1 - Last Use / Amount: unknown                  Sleep: Good  Appetite:  Good  Current Medications: Current Facility-Administered Medications  Medication Dose Route Frequency Provider Last Rate Last Dose  . acetaminophen (TYLENOL) tablet 650 mg  650 mg Oral Q6H PRN Okonkwo, Justina A, NP   650 mg at 04/12/17 2152  . alum & mag hydroxide-simeth (MAALOX/MYLANTA) 200-200-20 MG/5ML suspension 30 mL  30 mL Oral Q4H PRN Okonkwo, Justina A, NP      . aspirin chewable tablet 81 mg  81 mg Oral Daily Okonkwo, Justina A, NP   81 mg at 04/16/17 0802  . cholecalciferol (VITAMIN D) tablet 2,000 Units  2,000 Units Oral Daily  Okonkwo, Justina A, NP   2,000 Units at 04/16/17 0802  . GRX ANALGESIC BALM OINT 1 application  1 application Apply externally PRN Cobos, Myer Peer, MD      . LORazepam (ATIVAN) tablet 1 mg  1 mg Oral TID Nanci Pina, FNP   1 mg at 04/16/17 1230  . LORazepam (ATIVAN) tablet 1 mg  1 mg Oral TID PRN Norman Clay, MD   1 mg at 04/16/17 0801  . magnesium hydroxide (MILK OF MAGNESIA) suspension 30 mL  30 mL Oral Daily PRN Okonkwo, Justina A, NP      . OLANZapine zydis (ZYPREXA) disintegrating tablet 5 mg  5 mg Oral BID Nanci Pina, FNP   5 mg at 04/15/17 2141  . OLANZapine zydis (ZYPREXA) disintegrating tablet 5 mg  5 mg Oral BID PRN Norman Clay, MD   5 mg at 04/16/17 0152  . [START ON 04/17/2017] sodium chloride tablet 1 g  1 g Oral Daily Regalado, Belkys A, MD      . tamsulosin (FLOMAX) capsule 0.4 mg  0.4 mg Oral Daily Laverle Hobby, PA-C   0.4 mg at 04/16/17 0802    Lab Results:  Results for orders placed or performed during the hospital encounter of 04/11/17 (from the past 48 hour(s))  Basic metabolic panel     Status: Abnormal   Collection Time: 04/14/17  6:17 PM  Result Value Ref Range   Sodium 127 (L) 135 - 145 mmol/L   Potassium 3.6 3.5 - 5.1 mmol/L   Chloride 93 (L) 101 - 111 mmol/L   CO2 26 22 - 32 mmol/L   Glucose, Bld 151 (H) 65 - 99 mg/dL   BUN 7 6 - 20 mg/dL   Creatinine, Ser 0.75 0.61 - 1.24 mg/dL   Calcium 9.1 8.9 - 10.3 mg/dL   GFR calc non Af Amer >60 >60 mL/min   GFR calc Af Amer >60 >60 mL/min    Comment: (NOTE) The eGFR has been calculated using the CKD EPI equation. This calculation has not been validated in all clinical situations. eGFR's persistently <60 mL/min signify possible Chronic Kidney Disease.    Anion gap 8 5 - 15    Comment: Performed at [facility], [address] [place], [place] [address]  Basic metabolic panel     Status: Abnormal   Collection Time: 04/15/17  5:34 PM  Result Value Ref Range   Sodium 130 (L)  135 - 145 mmol/L   Potassium 3.9 3.5 - 5.1 mmol/L   Chloride 96 (L) 101 -  111 mmol/L   CO2 26 22 - 32 mmol/L   Glucose, Bld 174 (H) 65 - 99 mg/dL   BUN 8 6 - 20 mg/dL   Creatinine, Ser 0.76 0.61 - 1.24 mg/dL   Calcium 9.2 8.9 - 10.3 mg/dL   GFR calc non Af Amer >60 >60 mL/min   GFR calc Af Amer >60 >60 mL/min    Comment: (NOTE) The eGFR has been calculated using the CKD EPI equation. This calculation has not been validated in all clinical situations. eGFR's persistently <60 mL/min signify possible Chronic Kidney Disease.    Anion gap 8 5 - 15    Comment: Performed at [facility], [place] [address] [place], [place] [address]  Basic metabolic panel     Status: Abnormal   Collection Time: 04/16/17  6:18 AM  Result Value Ref Range   Sodium 133 (L) 135 - 145 mmol/L   Potassium 4.0 3.5 - 5.1 mmol/L   Chloride 100 (L) 101 - 111 mmol/L   CO2 27 22 - 32 mmol/L   Glucose, Bld 99 65 - 99 mg/dL   BUN 9 6 - 20 mg/dL   Creatinine, Ser 0.68 0.61 - 1.24 mg/dL   Calcium 9.4 8.9 - 10.3 mg/dL   GFR calc non Af Amer >60 >60 mL/min   GFR calc Af Amer >60 >60 mL/min    Comment: (NOTE) The eGFR has been calculated using the CKD EPI equation. This calculation has not been validated in all clinical situations. eGFR's persistently <60 mL/min signify possible Chronic Kidney Disease.    Anion gap 6 5 - 15    Comment: Performed at [facility], [place] [address], [place], [place] [address]    Blood Alcohol level:  Lab Results  Component Value Date   ETH <5 41/93/7902    Metabolic Disorder Labs: Lab Results  Component Value Date   HGBA1C 5.3 01/20/2014   No results found for: PROLACTIN Lab Results  Component Value Date   CHOL 181 02/26/2015   TRIG 64.0 02/26/2015   HDL 80.50 02/26/2015   CHOLHDL 2 02/26/2015   VLDL 12.8 02/26/2015   LDLCALC 88 02/26/2015   LDLCALC 78 07/23/2013    Physical Findings: AIMS: Facial and Oral Movements Muscles of  Facial Expression: None, normal Lips and Perioral Area: None, normal Jaw: None, normal Tongue: None, normal,Extremity Movements Upper (arms, wrists, hands, fingers): None, normal Lower (legs, knees, ankles, toes): None, normal, Trunk Movements Neck, shoulders, hips: None, normal, Overall Severity Severity of abnormal movements (highest score from questions above): None, normal Incapacitation due to abnormal movements: None, normal Patient's awareness of abnormal movements (rate only patient's report): No Awareness, Dental Status Current problems with teeth and/or dentures?: No Does patient usually wear dentures?: No  CIWA:    COWS:     Musculoskeletal: Strength & Muscle Tone: within normal limits Gait & Station: normal Patient leans: N/A  Psychiatric Specialty Exam: Physical Exam  Review of Systems  Psychiatric/Behavioral: Positive for hallucinations. Negative for depression, substance abuse and suicidal ideas. The patient is not nervous/anxious and does not have insomnia.   All other systems reviewed and are negative.   Blood pressure (!) 144/98, pulse 81, temperature (!) 97.5 F (36.4 C), temperature source Oral, resp. rate 16, height _0  (1.753 m), weight 186 lb (84.4 kg), SpO2 100 %.Body mass index is 27.47 kg/m.  General Appearance: Fairly Groomed  Eye Contact:  Good  Speech:  Clear and Coherent  Volume:  Normal  Mood:  "calmer"  Affect:  Appropriate and Congruent, euthymic  Thought Process:  Coherent, less tangential  Orientation:  Full (Time, Place, and Person)  Thought Content:  no paranoia Perceptions: denies AH, VH of sailboat  Suicidal Thoughts:  No  Homicidal Thoughts:  No  Memory:  Immediate;   Good Recent;   Good Remote;   Good  Judgement:  Fair  Insight:  Present  Psychomotor Activity:  Normal  Concentration:  Concentration: Good and Attention Span: Good  Recall:  Good  Fund of Knowledge:  Good  Language:  Good  Akathisia:  No  Handed:  Right  AIMS  (if indicated):     Assets:  Communication Skills Desire for Improvement  ADL's:  Intact  Cognition:  WNL  Sleep:  Number of Hours: 4.75   Assessment Robert Fowler is a 66 year old male, who originally presented to ED for "manic behavior" after watching Trump/Putin interviews on TV.   # Bipolar I disorder Exam is notable for his significantly improved psychomotor activity and he demonstrates less tangential though process after switching from Abilify to olanzapine. Will increase night time dose for mood stabilization. Will continue ativan for restlessness. Has not started on mood stabilizer given hyponatremia, and relatively well controlled symptoms on olanzapine.   # Primary polydipsia Patient is on sodium tabs and on water restriction. Appreciate medicine recs. Gradually improved with limiting water intake. Will continue sitter at this time to monitor water intake. Will obtain labs tomorrow.   Plan - Increase olanzapine 5 mg daily and 10 mg qhs - Continue olanzapine 5 mg BID prn for agitation - Continue ativan 1 mg TID - Continue ativan 1 mg TID prn for anxiety - Continue Trazodone 50 mg qhsprn for insomnia - Continue 1:1 sitter for monitoring water intake/primary polydipsia  Treatment Plan Summary: Daily contact with patient to assess and evaluate symptoms and progress in treatment  Norman Clay, MD 04/16/2017, 12:57 PM

## 2017-04-16 NOTE — BHH Group Notes (Signed)
[facility] LCSW Group Therapy  04/16/2017 10:15 AM - 11 AM  Type of Therapy:  Group Therapy  Participation Level:  Minimal  Participation Quality:  Attentive and Sharing  Affect:  Depressed and Flat  Cognitive:  Oriented  Insight:  Limited  Engagement in Therapy:  Limited  Modes of Intervention:  Clarification, Discussion, Exploration, and Support  Summary of Progress/Problems: Topic for today was thoughts and feelings regarding discharge. We discussed fears of upcoming changes including judgements, expectations and stigma of mental health issues. We then discussed supports: what constitutes a supportive framework, identification of supports and what to do when others are not supportive. Patients then identified a specific coping tool to use when others are not available. Patient identified most with relapse stage of change and was given supportive feedback from others as he is much calm today. Sheilah Pigeon, LCSW

## 2017-04-17 LAB — BASIC METABOLIC PANEL
ANION GAP: 8 (ref 5–15)
BUN: 13 mg/dL (ref 6–20)
CALCIUM: 9.5 mg/dL (ref 8.9–10.3)
CO2: 24 mmol/L (ref 22–32)
Chloride: 102 mmol/L (ref 101–111)
Creatinine, Ser: 0.75 mg/dL (ref 0.61–1.24)
Glucose, Bld: 91 mg/dL (ref 65–99)
Potassium: 4.2 mmol/L (ref 3.5–5.1)
SODIUM: 134 mmol/L — AB (ref 135–145)

## 2017-04-17 MED ORDER — LORAZEPAM 1 MG PO TABS
1.0000 mg | ORAL_TABLET | Freq: Two times a day (BID) | ORAL | Status: DC
  Administered 2017-04-18: 1 mg via ORAL
  Filled 2017-04-17: qty 1

## 2017-04-17 NOTE — Progress Notes (Signed)
1:1 Nursing Note:  D:  Pt ambulating in the hall, he is pleasant and oriented x 3.  Denies dizziness, gait is steady. Sodium level up to 134 today. Asking to go to the gym so that he can walk laps.  "I was really worried about relations between Putin and Trump when I came in, Trump was looking grim and Putin is a good liar, was worried about World War 3 starting.  Now I feel that things are better and am no longer worried."  "I sure hope I can stay to participate in [facility] on Thursday night, I love that ."  A:  Encouraged to verbalize needs and concerns, active listening and support provided.  Continued Q 15 minute safety checks.    R:  Pt. is calm and cooperative.  Denies A/V hallucinations and is able to verbally contract for safety.

## 2017-04-17 NOTE — BHH Group Notes (Signed)
Pt attended spiritual care group on grief and loss facilitated by chaplain Marium Ragan   Group opened with brief discussion and psycho-social ed around grief and loss in relationships and in relation to self - identifying life patterns, circumstances, changes that cause losses. Established group norm of speaking from own life experience. Group goal of establishing open and affirming space for members to share loss and experience with grief, normalize grief experience and provide psycho social education and grief support.     

## 2017-04-17 NOTE — Progress Notes (Signed)
Post 1:1 Note:  D: Pt sitting in hallway visiting with a church friend. No complaints all day just a request to stay until Friday, "I really like Karaoke and want to sing Thursday night.  Pt remains with steady gait, alert and oriented x 3. No further confusion noted. A: Continued Q 15 minute checks. R: Pt remains safe in the unit.

## 2017-04-17 NOTE — Progress Notes (Signed)
[place] [facility] 1:1 Observation Documentation  For the first (8) hours following discontinuation of 1:1 precautions, a progress note entry by nursing staff should be documented at least every 2 hours, reflecting the patient's behavior, condition, mood, and conversation.  Use the progress notes for additional entries.  Time 1:1 discontinued:  1715  Patient's Behavior:  Pt is asleep  Patient's Condition:  Pt calm; no distress observed.  Patient's Conversation:  Pt is asleep  Junius Finner [facility] 04/17/2017, 11:23 PM

## 2017-04-17 NOTE — Progress Notes (Signed)
1:1 Note: Pt at this time is in bed resting with eyes closed. Pt does not look to be in any distress at this time. 1:1 staff is present in room with Pt at this time. 1:1 monitoring continues for Pt's safety. 15-minute safety checks also continues at this time. 

## 2017-04-17 NOTE — Progress Notes (Signed)
[place] [facility] 1:1 Observation Documentation  For the first (8) hours following discontinuation of 1:1 precautions, a progress note entry by nursing staff should be documented at least every 2 hours, reflecting the patient's behavior, condition, mood, and conversation.  Use the progress notes for additional entries.  Time 1:1 discontinued:  1750  Patient's Behavior:  Pt is stable; no manic behaviors at this time, although he does find it difficult to sit for very long.  Patient's Condition:  Pt is calm and cooperative.  He went to group on the 400 hall.    Patient's Conversation:  Pt came to the med window to take his meds and was focused on some Bible verses that he feels are not translated correctly.  He mentioned something about sexual about the verse.  Writer chose not to engage the patient about the scripture, but instead encouraged him to talk to his pastor about his concerns.  He voices no needs or other concerns at this time.    Junius Finner [facility] 04/17/2017, 9:25 PM

## 2017-04-17 NOTE — Progress Notes (Signed)
1:1 Nursing note:  After evaluating the pt, MD DC'd 1:1 observation.  Pt verbalizes that he will be careful with fluid intake.  Pt is oriented x 3 and cooperative. Continued Q15 minute routine checks.

## 2017-04-17 NOTE — Progress Notes (Signed)
Recreation Therapy Notes  Date: 04/17/2017 Time: 9:30am Location: 300 Hall Dayroom  Group Topic: Stress Management  Goal Area(s) Addresses:  Patient will verbalize importance of using healthy stress management.  Patient will identify positive emotions associated with healthy stress management.   Behavioral Response: Engaged  Intervention: Stress Management  Activity : Deep Breathing Meditation. Recreation Therapy Intern introduced the stress management technique of meditation. Recreation Therapy Intern played a YouTube video that helped pts work on deep breathing with beach sounds in the background. Patients were to follow along as the video played to engage in the activity.  Education: Stress Management, Discharge Planning.   Education Outcome: Acknowledges edcuation  Clinical Observations/Feedback: Pt attended group.  Rachel Meyer, Recreation Therapy Intern  A[place]re [place], LRT/CTRS 

## 2017-04-17 NOTE — Progress Notes (Signed)
1:1 Nursing note:  D: Pt napping in bed. A: Remains on 1:1 for safety. R: No risky behaviors noted, remains safe in the unit.

## 2017-04-17 NOTE — Progress Notes (Signed)
1:1 Note  Pt at this time is in bed resting. Pt does not look to be in any distress at this time. 1:1 staff is present in room with Pt at this time. 1:1 monitoring continues for Pt's safety. 15-minute safety checks also continues at this time.

## 2017-04-17 NOTE — Progress Notes (Signed)
1:1 Note  Pt at this time in dayroom interacting with sitter. Pt is irritable and mildly confuse; disoriented to situation and time. Pt was med compliant. 1:1 staff is present and interacting with Pt at this time. 1:1 monitoring continues for Pt's safety.

## 2017-04-17 NOTE — Progress Notes (Signed)
[facility] MD Progress Note  04/17/2017 5:11 PM Robert Fowler  MRN:  [id number] Subjective:   Patient reports " I don't feel ready to leave yet, but I am doing better ". Denies medication side effects. Objective : I have discussed case with treatment team and have met with patient . Patient remains vaguely expansive and talkative, but presents improved compared to initial presentation- less pressured in speech, thought process is more linear and not disorganized, not  intrusive on personal space, not irritable .  He does continue to ruminate about world politics but less intensely. He is currently fully alert and attentive. No overtly disruptive or agitated behaviors on unit. Of note, patient was started on one to one observation due to potential falls- no suicidal ideations. As discussed with nursing staff, he is now improved, gait is steady, hyponatremia is now corrected. Will D/C one to one observation status . Labs improved- Na+ improved to 134.   Principal Problem: Bipolar 1 disorder ([place]) Diagnosis:   Patient Active Problem List   Diagnosis Date Noted  . Hyponatremia [E87.1] 04/12/2017  . Bipolar 1 disorder ([place]) [F31.9] 04/11/2017  . Medicare annual wellness visit, initial [Z00.00] 03/03/2015  . Advanced care planning/counseling discussion [Z71.89] 03/03/2015  . Health maintenance examination [Z00.00] 01/27/2014  . Pes planus [M21.40] 01/11/2008  . Osteoporosis [M81.0] 09/04/2007  . Ex-smoker [U20.254] 09/04/2007  . Hematuria [R31.9] 07/18/2007  . Benign prostatic hyperplasia [N40.0] 07/18/2007   Total Time spent with patient: 20 minutes  Past Psychiatric History: see HPI  Past Medical History:  Past Medical History:  Diagnosis Date  . Arthritis   . BENIGN PROSTATIC HYPERTROPHY 07/18/2007  . Bipolar disorder ([place])   . Habitual alcohol use   . OSTEOPOROSIS 09/04/2007   DEXA -2.8 lumbar (03/2015)  . Pes planus   . Prediabetes 2014  . Schizophrenia, paranoid ([place])     schizoaffective disorder    Past Surgical History:  Procedure Laterality Date  . INGUINAL HERNIA REPAIR Right 08/2014   Dr Gershon Crane   Family History:  Family History  Problem Relation Age of Onset  . Alcohol abuse Father   . Alcohol abuse Maternal Grandmother   . COPD Mother        smoker  . CAD Neg Hx   . Stroke Neg Hx   . Cancer Neg Hx   . Diabetes Neg Hx   . Hypertension Neg Hx    Family Psychiatric  History: see HPI Social History:  History  Alcohol Use  . 2.4 oz/week  . 4 Glasses of wine per week     History  Drug Use  . Types: Marijuana    Social History   Social History  . Marital status: Single    Spouse name: N/A  . Number of children: N/A  . Years of education: N/A   Social History Main Topics  . Smoking status: Former Smoker    Quit date: 09/26/2006  . Smokeless tobacco: Never Used  . Alcohol use 2.4 oz/week    4 Glasses of wine per week  . Drug use: Yes    Types: Marijuana  . Sexual activity: Not Asked   Other Topics Concern  . None   Social History Narrative   Lives alone.     Mother and brother live nearby.   Occupation: Retail buyer at [employer]   Edu: 12th grade   Activity: mows yard   Diet: good water, fruits/vegetables daily      Disability evaluation - no disability (04/2014)  Additional Social History:    Pain Medications: pt denies abuse - see pta meds list Prescriptions: pt denies abuse - see pta meds list Over the Counter: pt denies abuse - see pta meds list History of alcohol / drug use?: Yes Name of Substance 1: etoh 1 - Age of First Use: 19 1 - Amount (size/oz): 8 oz glass wine 1 - Frequency: twice a week 1 - Duration: years 1 - Last Use / Amount: unknown  Sleep: improving   Appetite:  Good  Current Medications: Current Facility-Administered Medications  Medication Dose Route Frequency Provider Last Rate Last Dose  . acetaminophen (TYLENOL) tablet 650 mg  650 mg Oral Q6H PRN Okonkwo, Justina A, NP   650 mg at  04/12/17 2152  . alum & mag hydroxide-simeth (MAALOX/MYLANTA) 200-200-20 MG/5ML suspension 30 mL  30 mL Oral Q4H PRN Okonkwo, Justina A, NP      . aspirin chewable tablet 81 mg  81 mg Oral Daily Okonkwo, Justina A, NP   81 mg at 04/17/17 0815  . cholecalciferol (VITAMIN D) tablet 2,000 Units  2,000 Units Oral Daily Okonkwo, Justina A, NP   2,000 Units at 04/17/17 0814  . GRX ANALGESIC BALM OINT 1 application  1 application Apply externally PRN Nazire Fruth, Myer Peer, MD      . LORazepam (ATIVAN) tablet 1 mg  1 mg Oral TID Nanci Pina, FNP   1 mg at 04/17/17 1710  . LORazepam (ATIVAN) tablet 1 mg  1 mg Oral TID PRN Norman Clay, MD   1 mg at 04/16/17 0801  . magnesium hydroxide (MILK OF MAGNESIA) suspension 30 mL  30 mL Oral Daily PRN Okonkwo, Justina A, NP      . OLANZapine zydis (ZYPREXA) disintegrating tablet 10 mg  10 mg Oral QHS Norman Clay, MD   10 mg at 04/16/17 2218  . OLANZapine zydis (ZYPREXA) disintegrating tablet 5 mg  5 mg Oral BID PRN Norman Clay, MD   5 mg at 04/16/17 0152  . OLANZapine zydis (ZYPREXA) disintegrating tablet 5 mg  5 mg Oral Daily Hisada, Reina, MD   5 mg at 04/17/17 0815  . sodium chloride tablet 1 g  1 g Oral Daily Regalado, Belkys A, MD   1 g at 04/17/17 0814  . tamsulosin (FLOMAX) capsule 0.4 mg  0.4 mg Oral Daily Laverle Hobby, PA-C   0.4 mg at 04/17/17 5643    Lab Results:  Results for orders placed or performed during the hospital encounter of 04/11/17 (from the past 48 hour(s))  Basic metabolic panel     Status: Abnormal   Collection Time: 04/15/17  5:34 PM  Result Value Ref Range   Sodium 130 (L) 135 - 145 mmol/L   Potassium 3.9 3.5 - 5.1 mmol/L   Chloride 96 (L) 101 - 111 mmol/L   CO2 26 22 - 32 mmol/L   Glucose, Bld 174 (H) 65 - 99 mg/dL   BUN 8 6 - 20 mg/dL   Creatinine, Ser 0.76 0.61 - 1.24 mg/dL   Calcium 9.2 8.9 - 10.3 mg/dL   GFR calc non Af Amer >60 >60 mL/min   GFR calc Af Amer >60 >60 mL/min    Comment: (NOTE) The eGFR has been  calculated using the CKD EPI equation. This calculation has not been validated in all clinical situations. eGFR's persistently <60 mL/min signify possible Chronic Kidney Disease.    Anion gap 8 5 - 15    Comment: Performed at [facility], [place] [address] [place], [place] [address]  Basic metabolic panel     Status: Abnormal   Collection Time: 04/16/17  6:18 AM  Result Value Ref Range   Sodium 133 (L) 135 - 145 mmol/L   Potassium 4.0 3.5 - 5.1 mmol/L   Chloride 100 (L) 101 - 111 mmol/L   CO2 27 22 - 32 mmol/L   Glucose, Bld 99 65 - 99 mg/dL   BUN 9 6 - 20 mg/dL   Creatinine, Ser 0.68 0.61 - 1.24 mg/dL   Calcium 9.4 8.9 - 10.3 mg/dL   GFR calc non Af Amer >60 >60 mL/min   GFR calc Af Amer >60 >60 mL/min    Comment: (NOTE) The eGFR has been calculated using the CKD EPI equation. This calculation has not been validated in all clinical situations. eGFR's persistently <60 mL/min signify possible Chronic Kidney Disease.    Anion gap 6 5 - 15    Comment: Performed at [facility], [place] [address] [place], [place] [address]  Basic metabolic panel     Status: Abnormal   Collection Time: 04/17/17  6:16 AM  Result Value Ref Range   Sodium 134 (L) 135 - 145 mmol/L   Potassium 4.2 3.5 - 5.1 mmol/L   Chloride 102 101 - 111 mmol/L   CO2 24 22 - 32 mmol/L   Glucose, Bld 91 65 - 99 mg/dL   BUN 13 6 - 20 mg/dL   Creatinine, Ser 0.75 0.61 - 1.24 mg/dL   Calcium 9.5 8.9 - 10.3 mg/dL   GFR calc non Af Amer >60 >60 mL/min   GFR calc Af Amer >60 >60 mL/min    Comment: (NOTE) The eGFR has been calculated using the CKD EPI equation. This calculation has not been validated in all clinical situations. eGFR's persistently <60 mL/min signify possible Chronic Kidney Disease.    Anion gap 8 5 - 15    Comment: Performed at [facility], [place] [address], [place], [place] [address]    Blood Alcohol level:  Lab Results  Component Value Date    ETH <5 26/71/2458    Metabolic Disorder Labs: Lab Results  Component Value Date   HGBA1C 5.3 01/20/2014   No results found for: PROLACTIN Lab Results  Component Value Date   CHOL 181 02/26/2015   TRIG 64.0 02/26/2015   HDL 80.50 02/26/2015   CHOLHDL 2 02/26/2015   VLDL 12.8 02/26/2015   LDLCALC 88 02/26/2015   LDLCALC 78 07/23/2013    Physical Findings: AIMS: Facial and Oral Movements Muscles of Facial Expression: None, normal Lips and Perioral Area: None, normal Jaw: None, normal Tongue: None, normal,Extremity Movements Upper (arms, wrists, hands, fingers): None, normal Lower (legs, knees, ankles, toes): None, normal, Trunk Movements Neck, shoulders, hips: None, normal, Overall Severity Severity of abnormal movements (highest score from questions above): None, normal Incapacitation due to abnormal movements: None, normal Patient's awareness of abnormal movements (rate only patient's report): No Awareness, Dental Status Current problems with teeth and/or dentures?: No Does patient usually wear dentures?: No  CIWA:    COWS:     Musculoskeletal: Strength & Muscle Tone: within normal limits Gait & Station: normal Patient leans: N/A  Psychiatric Specialty Exam: Physical Exam  Review of Systems  Psychiatric/Behavioral: Positive for hallucinations. Negative for depression, substance abuse and suicidal ideas. The patient is not nervous/anxious and does not have insomnia.   All other systems reviewed and are negative. denies nausea, denies vomiting  Blood pressure 116/78, pulse 96, temperature 98.7 F (37.1 C), temperature source Oral, resp. rate 16, height '5\' 9"'$  (1.753 m), weight 84.4 kg (186 lb), SpO2 100 %.Body mass index is 27.47 kg/m.  General Appearance: improved grooming   Eye Contact:  Good  Speech:  Clear and Coherent  Volume:  Normal  Mood:  improving, less manic   Affect:  less expansive, not irritable  Thought Process:  Better organized, more linear    Orientation:  Full (Time, Place, and Person)  Thought Content:  no hallucinations,no delusions, not internally preoccupied    Suicidal Thoughts:  No denies any suicidal or self injurious ideations, denies homicidal or violent ideations   Homicidal Thoughts:  No  Memory:  Immediate;   Good Recent;   Good Remote;   Good  Judgement:  Other:  improving   Insight:  Fair and improving   Psychomotor Activity:  Normal not currently agitated or restless  Concentration:  Concentration: Good and Attention Span: Good  Recall:  Good  Fund of Knowledge:  Good  Language:  Good  Akathisia:  No  Handed:  Right  AIMS (if indicated):     Assets:  Communication Skills Desire for Improvement  ADL's:  Intact  Cognition:  WNL  Sleep:  Number of Hours: 6.25   Assessment Patient is presenting with partial improvement compared to admission. He is less manic, presents less expansive, better organized in thought process, less pressured in speech,is sleeping better. Hyponatremia also improved .    Plan Treatment plan reviewed as below today 7/23  - Continue Olanzapine 5 mg QAM  and 10 mg QHS - Continue olanzapine 5 mg BID prn for agitation - Would initiate Ativan taper to1 mg BID - Continue Ativan1 mg TID prn for anxiety -Treatment team working on disposition planning options As discussed with staff , discontinue one to one observation- patient's gait steady, and expressing understanding of following water intake recommendations to minimize risk of hyponatremia   Treatment Plan Summary: Daily contact with patient to assess and evaluate symptoms and progress in treatment  Jenne Campus, MD 04/17/2017, 5:11 PMPatient ID: Robert Fowler, male   DOB: 1950/11/22, 66 y.o.   MRN: [id number]

## 2017-04-17 NOTE — [facility] Group Notes (Signed)
[facility] LCSW Group Therapy  04/17/2017 1:15pm  Type of Therapy:  Group Therapy vercoming Obstacles  Pt did not attend, declined invitation.   Adriana Reams, LCSW 04/17/2017 2:18 PM

## 2017-04-18 ENCOUNTER — Encounter (HOSPITAL_COMMUNITY): Payer: Self-pay | Admitting: Behavioral Health

## 2017-04-18 MED ORDER — LORAZEPAM 0.5 MG PO TABS
0.5000 mg | ORAL_TABLET | Freq: Two times a day (BID) | ORAL | Status: DC
  Administered 2017-04-18 – 2017-04-19 (×2): 0.5 mg via ORAL
  Filled 2017-04-18 (×2): qty 1

## 2017-04-18 MED ORDER — TAMSULOSIN HCL 0.4 MG PO CAPS: 0.4000 mg | ORAL_CAPSULE | Freq: Every day | ORAL | 0 refills | Status: DC

## 2017-04-18 MED ORDER — OLANZAPINE 5 MG PO TBDP: 5.0000 mg | ORAL_TABLET | Freq: Every day | ORAL | 0 refills | Status: DC

## 2017-04-18 MED ORDER — SODIUM CHLORIDE 1 G PO TABS: 1.0000 g | ORAL_TABLET | Freq: Every day | ORAL | 0 refills | Status: DC

## 2017-04-18 NOTE — Progress Notes (Signed)
  [facility] Adult Case Management Discharge Plan :  Will you be returning to the same living situation after discharge:  Yes,  pt returning home. At discharge, do you have transportation home?: Yes,  pt has access to transportation. Do you have the ability to pay for your medications: Yes,  pt has insurance.  Release of information consent forms completed and in the chart;  Patient's signature needed at discharge.  Patient to Follow up at: Follow-up Information    [facility] Follow up on 04/20/2017.   Specialty:  Behavioral Health Why:  Hospital discharge follow up appointment Dr Josph Macho on 7/26 at 11:40 AM.  Please call to cancel/reschedule if needed. Bring discharge paperwork.  Contact information: [place] [address] [phone]           Next level of care provider has access to [place] and Suicide Prevention discussed: Yes,  with pt and with pt's mother.  Have you used any form of tobacco in the last 30 days? (Cigarettes, Smokeless Tobacco, Cigars, and/or Pipes): No  Has patient been referred to the Quitline?: N/A patient is not a smoker  Patient has been referred for addiction treatment: [place], MSW, LCSWA 04/18/2017, 9:27 AM

## 2017-04-18 NOTE — Progress Notes (Signed)
[facility] MD Progress Note  04/18/2017 10:39 AM Robert Fowler  MRN:  [id number] Subjective:   Patient states he is feeling " more dizzy today", and states " honestly , I do not feel ready to leave, I am not feeling that good today".  Attributes dizziness and sedation to Zyprexa . States " I have been on Zyprexa before and I like it , but it does make me tired ".  Objective : I have discussed case with treatment team and have met with patient . Today patient presents vaguely drowsy, slowed . He was sleeping on approach, wakes up on calling name. He reports feeling somewhat dizzy, " woozy".  Vitals - 135/95 pulse 73 sitting, and 145/97, pulse 77 standing  He states he feels his thoughts have quieted , and  is less focused on world events compared to admission. He is still presenting with some disorganized behaviors- for example, showed me a long list of unintelligible words, scribbles he wrote in his journal, states it is his family tree. No disruptive or agitated behaviors on unit , going to some groups.    Principal Problem: Bipolar 1 disorder ([place]) Diagnosis:   Patient Active Problem List   Diagnosis Date Noted  . Hyponatremia [E87.1] 04/12/2017  . Bipolar 1 disorder ([place]) [F31.9] 04/11/2017  . Medicare annual wellness visit, initial [Z00.00] 03/03/2015  . Advanced care planning/counseling discussion [Z71.89] 03/03/2015  . Health maintenance examination [Z00.00] 01/27/2014  . Pes planus [M21.40] 01/11/2008  . Osteoporosis [M81.0] 09/04/2007  . Ex-smoker [U72.536] 09/04/2007  . Hematuria [R31.9] 07/18/2007  . Benign prostatic hyperplasia [N40.0] 07/18/2007   Total Time spent with patient: 20 minutes  Past Psychiatric History: see HPI  Past Medical History:  Past Medical History:  Diagnosis Date  . Arthritis   . BENIGN PROSTATIC HYPERTROPHY 07/18/2007  . Bipolar disorder ([place])   . Habitual alcohol use   . OSTEOPOROSIS 09/04/2007   DEXA -2.8 lumbar (03/2015)  . Pes planus   .  Prediabetes 2014  . Schizophrenia, paranoid ([place])    schizoaffective disorder    Past Surgical History:  Procedure Laterality Date  . INGUINAL HERNIA REPAIR Right 08/2014   Dr Gershon Crane   Family History:  Family History  Problem Relation Age of Onset  . Alcohol abuse Father   . Alcohol abuse Maternal Grandmother   . COPD Mother        smoker  . CAD Neg Hx   . Stroke Neg Hx   . Cancer Neg Hx   . Diabetes Neg Hx   . Hypertension Neg Hx    Family Psychiatric  History: see HPI Social History:  History  Alcohol Use  . 2.4 oz/week  . 4 Glasses of wine per week     History  Drug Use  . Types: Marijuana    Social History   Social History  . Marital status: Single    Spouse name: N/A  . Number of children: N/A  . Years of education: N/A   Social History Main Topics  . Smoking status: Former Smoker    Quit date: 09/26/2006  . Smokeless tobacco: Never Used  . Alcohol use 2.4 oz/week    4 Glasses of wine per week  . Drug use: Yes    Types: Marijuana  . Sexual activity: Not Asked   Other Topics Concern  . None   Social History Narrative   Lives alone.     Mother and brother live nearby.   Occupation: Retail buyer at [employer]  Edu: 12th grade   Activity: mows yard   Diet: good water, fruits/vegetables daily      Disability evaluation - no disability (04/2014)   Additional Social History:    Pain Medications: pt denies abuse - see pta meds list Prescriptions: pt denies abuse - see pta meds list Over the Counter: pt denies abuse - see pta meds list History of alcohol / drug use?: Yes Name of Substance 1: etoh 1 - Age of First Use: 19 1 - Amount (size/oz): 8 oz glass wine 1 - Frequency: twice a week 1 - Duration: years 1 - Last Use / Amount: unknown  Sleep: Fair  Appetite:  Fair  Current Medications: Current Facility-Administered Medications  Medication Dose Route Frequency Provider Last Rate Last Dose  . acetaminophen (TYLENOL) tablet 650 mg  650 mg Oral  Q6H PRN Okonkwo, Justina A, NP   650 mg at 04/18/17 0154  . alum & mag hydroxide-simeth (MAALOX/MYLANTA) 200-200-20 MG/5ML suspension 30 mL  30 mL Oral Q4H PRN Okonkwo, Justina A, NP      . aspirin chewable tablet 81 mg  81 mg Oral Daily Okonkwo, Justina A, NP   81 mg at 04/18/17 0827  . cholecalciferol (VITAMIN D) tablet 2,000 Units  2,000 Units Oral Daily Okonkwo, Justina A, NP   2,000 Units at 04/18/17 0827  . GRX ANALGESIC BALM OINT 1 application  1 application Apply externally PRN Lynesha Bango, Myer Peer, MD   1 application at 01/60/10 0154  . LORazepam (ATIVAN) tablet 1 mg  1 mg Oral TID PRN Norman Clay, MD   1 mg at 04/17/17 2338  . LORazepam (ATIVAN) tablet 1 mg  1 mg Oral BID Jarom Govan, Myer Peer, MD   1 mg at 04/18/17 0827  . magnesium hydroxide (MILK OF MAGNESIA) suspension 30 mL  30 mL Oral Daily PRN Okonkwo, Justina A, NP      . OLANZapine zydis (ZYPREXA) disintegrating tablet 10 mg  10 mg Oral QHS Norman Clay, MD   10 mg at 04/17/17 2114  . OLANZapine zydis (ZYPREXA) disintegrating tablet 5 mg  5 mg Oral BID PRN Norman Clay, MD   5 mg at 04/16/17 0152  . OLANZapine zydis (ZYPREXA) disintegrating tablet 5 mg  5 mg Oral Daily Hisada, Reina, MD   5 mg at 04/18/17 0828  . sodium chloride tablet 1 g  1 g Oral Daily Regalado, Belkys A, MD   1 g at 04/18/17 0922  . tamsulosin (FLOMAX) capsule 0.4 mg  0.4 mg Oral Daily Laverle Hobby, PA-C   0.4 mg at 04/18/17 9323    Lab Results:  Results for orders placed or performed during the hospital encounter of 04/11/17 (from the past 48 hour(s))  Basic metabolic panel     Status: Abnormal   Collection Time: 04/17/17  6:16 AM  Result Value Ref Range   Sodium 134 (L) 135 - 145 mmol/L   Potassium 4.2 3.5 - 5.1 mmol/L   Chloride 102 101 - 111 mmol/L   CO2 24 22 - 32 mmol/L   Glucose, Bld 91 65 - 99 mg/dL   BUN 13 6 - 20 mg/dL   Creatinine, Ser 0.75 0.61 - 1.24 mg/dL   Calcium 9.5 8.9 - 10.3 mg/dL   GFR calc non Af Amer >60 >60 mL/min   GFR calc  Af Amer >60 >60 mL/min    Comment: (NOTE) The eGFR has been calculated using the CKD EPI equation. This calculation has not been validated in all clinical  situations. eGFR's persistently <60 mL/min signify possible Chronic Kidney Disease.    Anion gap 8 5 - 15    Comment: Performed at [facility], [address], [place], [place] [address]    Blood Alcohol level:  Lab Results  Component Value Date   ETH <5 04/11/2017    Metabolic Disorder Labs: Lab Results  Component Value Date   HGBA1C 5.3 01/20/2014   No results found for: PROLACTIN Lab Results  Component Value Date   CHOL 181 02/26/2015   TRIG 64.0 02/26/2015   HDL 80.50 02/26/2015   CHOLHDL 2 02/26/2015   VLDL 12.8 02/26/2015   LDLCALC 88 02/26/2015   LDLCALC 78 07/23/2013    Physical Findings: AIMS: Facial and Oral Movements Muscles of Facial Expression: None, normal Lips and Perioral Area: None, normal Jaw: None, normal Tongue: None, normal,Extremity Movements Upper (arms, wrists, hands, fingers): None, normal Lower (legs, knees, ankles, toes): None, normal, Trunk Movements Neck, shoulders, hips: None, normal, Overall Severity Severity of abnormal movements (highest score from questions above): None, normal Incapacitation due to abnormal movements: None, normal Patient's awareness of abnormal movements (rate only patient's report): No Awareness, Dental Status Current problems with teeth and/or dentures?: No Does patient usually wear dentures?: No  CIWA:    COWS:     Musculoskeletal: Strength & Muscle Tone: within normal limits Gait & Station: normal Patient leans: N/A  Psychiatric Specialty Exam: Physical Exam  Review of Systems  Psychiatric/Behavioral: Positive for hallucinations. Negative for depression, substance abuse and suicidal ideas. The patient is not nervous/anxious and does not have insomnia.   All other systems reviewed and are negative. denies nausea, denies vomiting    Blood pressure (!) 142/82, pulse 91, temperature 97.7 F (36.5 C), temperature source Oral, resp. rate 18, height 5\' 9"  (1.753 m), weight 84.4 kg (186 lb), SpO2 100 %.Body mass index is 27.47 kg/m.  General Appearance: improved grooming   Eye Contact:  Fair  Speech:  decreased   Volume:  Decreased  Mood:  currently improved , less manic  Affect:  less expansive, presents vaguely blunted today  Thought Process:  Better organized, more linear   Orientation:  Other:  fully alert and attentive, and is oriented to time and place   Thought Content:  no hallucinations, no delusions expressed    Suicidal Thoughts:  No denies any suicidal or self injurious ideations, denies homicidal or violent ideations   Homicidal Thoughts:  No  Memory: recent and remote fair   Judgement:  Other:  improving   Insight:  Fair and improving   Psychomotor Activity:  decreased today   Concentration:  Concentration: Good and Attention Span: Good  Recall:  Good  Fund of Knowledge:  Good  Language:  Good  Akathisia:  No  Handed:  Right  AIMS (if indicated):     Assets:  Communication Skills Desire for Improvement  ADL's:  Intact  Cognition:  WNL  Sleep:  Number of Hours: 5   Assessment Patient is presenting with partial improvement compared to admission. Symptoms of mania have abated, and no longer presents pressured in speech, expansive in affect, and thought process is more linear . He does continue to present vaguely disorganized . Denies any SI. He was tentatively scheduled for discharge today, but is describing feeling dizzy and lightheaded and appears vaguely sedated this AM ( of note, he becomes fully alert, attentive, and is 0x 3). Attributes above to Zyprexa , although he states Zyprexa has been an effective  medication for him in the past.  Of note, no orthostatic vital sign changes at this time. Na+ normalized to 134 on 7/23 We discussed options - will taper down Zyprexa and make it QHS dosing only to  minimize daytime side effects . Will also continue to taper down Ativan. Based on this AM's presentation , would postpone discharge today. Patient in agreement .     Plan Treatment plan reviewed as below today 7/24 - Decrease Olanzapine to 10 mgrs QHS for mania/bipolar disorder  - Continue olanzapine 5 mg BID prn for agitation - Taper Ativan taper to 0.5 mgrs BID for mood disorder  - Continue Ativan1 mg TID prn for anxiety - Continue olanzapine 5 mg BID prn for agitation -Treatment team working on disposition planning options    Treatment Plan Summary: Daily contact with patient to assess and evaluate symptoms and progress in treatment  Jenne Campus, MD 04/18/2017, 10:39 AM   Patient ID: Robert Fowler, male   DOB: 29-Apr-1951, 66 y.o.   MRN: [id number]

## 2017-04-18 NOTE — BHH Group Notes (Signed)
[place] LCSW Group Therapy 04/18/2017 1:15 PM  Type of Therapy: Group Therapy- Emotion Regulation  Participation Level: Active   Participation Quality:  Appropriate  Affect: Appropriate  Cognitive: Alert and Oriented   Insight:  Developing/Improving  Engagement in Therapy: Developing/Improving and Engaged   Modes of Intervention: Clarification, Confrontation, Discussion, Education, Exploration, Limit-setting, Orientation, Problem-solving, Rapport Building, Art therapist, Socialization and Support  Summary of Progress/Problems: The topic for group today was emotional regulation. This group focused on both positive and negative emotion identification and allowed group members to process ways to identify feelings, regulate negative emotions, and find healthy ways to manage internal/external emotions. Group members were asked to reflect on a time when their reaction to an emotion led to a negative outcome and explored how alternative responses using emotion regulation would have benefited them. Group members were also asked to discuss a time when emotion regulation was utilized when a negative emotion was experienced. Pt reports that he recently found a box of old pictures in his house of several of his family members. Pt states that looking at these pictures helps him when his sadness seems to take over. Pt reports that it's nice to look back on happy memories when he is feeling down.   Matthew Saras, MSW, [facility] 04/18/2017 3:31 PM

## 2017-04-18 NOTE — Discharge Summary (Signed)
Physician Discharge Summary Note  Patient:  Robert Fowler is an 66 y.o., male MRN:  [id number] DOB:  1951/09/04 Patient phone:  [phone] (home)  Patient address:   [place] [address],  Total Time spent with patient: 30 minutes  Date of Admission:  04/11/2017 Date of Discharge: 04/19/2017  Reason for Admission:  Manic behaviors   Principal Problem: Bipolar 1 disorder [name]) Discharge Diagnoses: Patient Active Problem List   Diagnosis Date Noted  . Hyponatremia [E87.1] 04/12/2017  . Bipolar 1 disorder ([place]) [F31.9] 04/11/2017  . Medicare annual wellness visit, initial [Z00.00] 03/03/2015  . Advanced care planning/counseling discussion [Z71.89] 03/03/2015  . Health maintenance examination [Z00.00] 01/27/2014  . Pes planus [M21.40] 01/11/2008  . Osteoporosis [M81.0] 09/04/2007  . Ex-smoker [J67.341] 09/04/2007  . Hematuria [R31.9] 07/18/2007  . Benign prostatic hyperplasia [N40.0] 07/18/2007    Past Psychiatric History: Long history of Schizophrenia and manic behaviors.   Past Medical History:  Past Medical History:  Diagnosis Date  . Arthritis   . BENIGN PROSTATIC HYPERTROPHY 07/18/2007  . Bipolar disorder ([place])   . Habitual alcohol use   . OSTEOPOROSIS 09/04/2007   DEXA -2.8 lumbar (03/2015)  . Pes planus   . Prediabetes 2014  . Schizophrenia, paranoid ([place])    schizoaffective disorder    Past Surgical History:  Procedure Laterality Date  . INGUINAL HERNIA REPAIR Right 08/2014   Dr Gershon Crane   Family History:  Family History  Problem Relation Age of Onset  . Alcohol abuse Father   . Alcohol abuse Maternal Grandmother   . COPD Mother        smoker  . CAD Neg Hx   . Stroke Neg Hx   . Cancer Neg Hx   . Diabetes Neg Hx   . Hypertension Neg Hx    Family Psychiatric  History: None noted Social History:  History  Alcohol Use  . 2.4 oz/week  . 4 Glasses of wine per week     History  Drug Use  . Types: Marijuana    Social History   Social  History  . Marital status: Single    Spouse name: N/A  . Number of children: N/A  . Years of education: N/A   Social History Main Topics  . Smoking status: Former Smoker    Quit date: 09/26/2006  . Smokeless tobacco: Never Used  . Alcohol use 2.4 oz/week    4 Glasses of wine per week  . Drug use: Yes    Types: Marijuana  . Sexual activity: Not Asked   Other Topics Concern  . None   Social History Narrative   Lives alone.     Mother and brother live nearby.   Occupation: Retail buyer at [employer]   Edu: 12th grade   Activity: mows yard   Diet: good water, fruits/vegetables daily      Disability evaluation - no disability (04/2014)    Hospital Course:  66 y.o. Male presented to the ED due to manic behaviors after watching the news and the Trump/Putin conference. He felt that the meeting between [place] did not go well and he started thinking there may be [employer] and at that moment he felt a rumble in his house and that he thought they had started a nuclear war. He then states that he realizes now that it was probably a large truck driving by his house and also that he probably should stop watching so much news. He reports continuing all  of his prescribed medications at home. He reports that he has not been eating or drinking as he should due to his manic feelings.  Objective:  Patient's labs show hyponatremia and Dr. Parke Poisson contacted for hospitalist consult. Will encourage patient to increase electrolyte rich fluids to improve lab levels and to assist in urination complaint last night. Patient states that he was told in the past he had an enlarged prostate and was prescribed Flomax last night and will continue it. Patient is very pleasant and cooperative, he has pressured speech, but may be his baseline per historical notes. He denies any SI/HI/AVH at this time.  After the above admission assessment, Robert Fowler's presenting symptoms were identified. His UDS was negative although he  presented with a history of ETOH abuse. Detoxification treatments administered as appropriate. He was medicated & discharged on;Olanzapine 5 mg QAM  and 10 mg QHS for mood stabilization and agitation. Ativan taper inititated to 1 mg po BID while on the unit and 1 mg po TID prn for anxiety. He was treated for hyponatremia and transferred to the ED for further evaluation. He was started on sodium chloride tablets and continued this medication throughout his hospital course. Upon discharge,  patient's gait steady, and expressing understanding of following water intake recommendations to minimize risk of hyponatremia. During his hospital course, he was enrolled & actively  participated in the group counseling sessions. AA/NA meetings were offered & held on this unit and patient actively particpated. He was able to verbalize coping skills that should help him cope better to maintain depression/mood stability upon returning home.  During the course of his hospitalization, patients improvement was monitored by observation and his daily report of symptom reduction. Evidence was further noted by  presentation of good affect and improved mood & behavior.  He tolerated his treatment regimen without any adverse effects reported.  Robert Fowler's case was presented during treatment team meeting this morning. The team members all agreed that Robert Fowler was both mentally & medically stable to be discharged to continue mental health care on an outpatient basis as noted below. He was provided with all the necessary information needed to make this appointment without problems.  Upon discharge, Robert Fowler denied any SIHI, AVH, delusional thoughts or paranoia. He also denied any substance withdrawal symptoms. He was provided with a  prescription for his [facility] discharge medications to be taken to his local pharmacy. He left [facility] with all personal belongings in no apparent distress. Transportation per his arrangement.  Physical Findings: AIMS:  Facial and Oral Movements Muscles of Facial Expression: None, normal Lips and Perioral Area: None, normal Jaw: None, normal Tongue: None, normal,Extremity Movements Upper (arms, wrists, hands, fingers): None, normal Lower (legs, knees, ankles, toes): None, normal, Trunk Movements Neck, shoulders, hips: None, normal, Overall Severity Severity of abnormal movements (highest score from questions above): None, normal Incapacitation due to abnormal movements: None, normal Patient's awareness of abnormal movements (rate only patient's report): No Awareness, Dental Status Current problems with teeth and/or dentures?: No Does patient usually wear dentures?: No  CIWA:    COWS:     Musculoskeletal: Strength & Muscle Tone: within normal limits Gait & Station: normal Patient leans: N/A  Psychiatric Specialty Exam: SEESRA BY MD  Physical Exam  Nursing note and vitals reviewed. Constitutional: He is oriented to person, place, and time.  Neurological: He is alert and oriented to person, place, and time.    Review of Systems  Psychiatric/Behavioral: Negative for suicidal ideas. Depression: improved.    Blood  pressure 139/89, pulse 80, temperature 97.9 F (36.6 C), temperature source Oral, resp. rate 16, height 5\' 9"  (1.753 m), weight 186 lb (84.4 kg), SpO2 100 %.Body mass index is 27.47 kg/m.   Have you used any form of tobacco in the last 30 days? (Cigarettes, Smokeless Tobacco, Cigars, and/or Pipes): No  Has this patient used any form of tobacco in the last 30 days? (Cigarettes, Smokeless Tobacco, Cigars, and/or Pipes)  N/A  Blood Alcohol level:  Lab Results  Component Value Date   ETH <5 81/44/8185    Metabolic Disorder Labs:  Lab Results  Component Value Date   HGBA1C 5.3 01/20/2014   No results found for: PROLACTIN Lab Results  Component Value Date   CHOL 181 02/26/2015   TRIG 64.0 02/26/2015   HDL 80.50 02/26/2015   CHOLHDL 2 02/26/2015   VLDL 12.8 02/26/2015   LDLCALC  88 02/26/2015   LDLCALC 78 07/23/2013    See Psychiatric Specialty Exam and Suicide Risk Assessment completed by Attending Physician prior to discharge.  Discharge destination:  Home  Is patient on multiple antipsychotic therapies at discharge:  No   Has Patient had three or more failed trials of antipsychotic monotherapy by history:  No  Recommended Plan for Multiple Antipsychotic Therapies: NA   Allergies as of 04/19/2017   No Known Allergies     Medication List    STOP taking these medications   ARIPiprazole 10 MG tablet Commonly known as:  ABILIFY   divalproex 250 MG 24 hr tablet Commonly known as:  DEPAKOTE ER   divalproex 500 MG 24 hr tablet Commonly known as:  DEPAKOTE ER   FLUoxetine 40 MG capsule Commonly known as:  PROZAC     TAKE these medications     Indication  acetaminophen 500 MG tablet Commonly known as:  TYLENOL Take 1,000 mg by mouth every 6 (six) hours as needed for moderate pain or headache.  Indication:  Pain   alendronate 70 MG tablet Commonly known as:  FOSAMAX Take 1 tablet (70 mg total) by mouth every 7 (seven) days. Take with a full glass of water on an empty stomach.  Indication:  bone deficiency   aspirin 81 MG tablet Take 81 mg by mouth daily.  Indication:  Fever, Pain, MI prevention   benztropine 0.5 MG tablet Commonly known as:  COGENTIN Take 0.5 mg by mouth at bedtime.  Indication:  Extrapyramidal Reaction caused by Medications   CALCIUM CITRATE PO Take 1 tablet by mouth daily.  Indication:  calcium supplement   FISH OIL PO Take 1,200 mg by mouth daily.  Indication:  hyperlipidemia   magnesium oxide 400 MG tablet Commonly known as:  MAG-OX Take 400 mg by mouth daily as needed.  Indication:  Disorder with Low Magnesium Levels   Melatonin 5 MG Tabs Take 1-5 mg by mouth at bedtime as needed.  Indication:  Trouble Sleeping   multivitamin tablet Take 1 tablet by mouth daily.  Indication:  vitamin suppellent    neomycin-polymyxin-pramoxine 1 % cream Commonly known as:  NEOSPORIN PLUS Apply 1 application topically 2 (two) times daily as needed (minor cuts/wounds).  Indication:  antibacterial  ointment   OLANZapine zydis 5 MG disintegrating tablet Commonly known as:  ZYPREXA Take 1 tablet (5 mg total) by mouth daily. Take 1 tablet by mouth in the morning. Take 2 tablets by mouth at bedtime.  Indication:  mood stbilization   sodium chloride 1 g tablet Take 1 tablet (1 g total) by mouth  daily.  Indication:  Electrolyte Disturbance   tamsulosin 0.4 MG Caps capsule Commonly known as:  FLOMAX Take 1 capsule (0.4 mg total) by mouth daily.  Indication:  urinary obstruction symtpoms   Vitamin D 2000 units Caps Take 1 capsule by mouth daily.  Indication:  Vitamin suppelement      Follow-up Information    [facility] Follow up on 04/20/2017.   Specialty:  Behavioral Health Why:  Hospital discharge follow up appointment Dr Josph Macho on 7/26 at 11:40 AM.  Please call to cancel/reschedule if needed. Bring discharge paperwork.  Contact information: [place] [address] [phone]           Follow-up recommendations:  Follow up with your outpatient provided for any medical issues. Activity & diet as recommended by your primary care provider.  Comments:  Patient is instructed prior to discharge to: Take all medications as prescribed by his/her mental healthcare provider. Report any adverse effects and or reactions from the medicines to his/her outpatient provider promptly. Patient has been instructed & cautioned: To not engage in alcohol and or illegal drug use while on prescription medicines. In the event of worsening symptoms, patient is instructed to call the crisis hotline, 911 and or go to the nearest ED for appropriate evaluation and treatment of symptoms. To follow-up with his/her primary care provider for your other medical issues, concerns and or health care  needs.  Signed: Mordecai Maes, NP 04/19/2017, 10:00 AM  Patient seen, Suicide Assessment Completed.  Disposition Plan Reviewed

## 2017-04-18 NOTE — Progress Notes (Signed)
DAR NOTE: Patient presents with calm affect and pleasant mood.  Denies pain, auditory and visual hallucinations.  Rates depression at 0, hopelessness at 0, and anxiety at 0.  Maintained on routine safety checks.  Medications given as prescribed.  Support and encouragement offered as needed.  Attended group and participated.  States goal for today is "to continue with treatment and get well."  Patient visible in milieu and hallway.  Minimal interaction with peers and staff.    Offered no complaint.

## 2017-04-18 NOTE — Progress Notes (Signed)
[facility] 1:1 Observation Documentation  For the first (8) hours following discontinuation of 1:1 precautions, a progress note entry by nursing staff should be documented at least every 2 hours, reflecting the patient's behavior, condition, mood, and conversation.  Use the progress notes for additional entries.  Time 1:1 discontinued:  7/ 23/18 at 1715  Patient's Behavior:  Pt is lying in bed at this time.    Patient's Condition:  Pt is calm and stable.  About 30 minutes prior to this check, pt was up with an unsteady gait.  The MHT on the hall was able to get the patient back to bed.    Patient's Conversation:  None  Ronney Asters 04/18/2017, 1:20 AM

## 2017-04-18 NOTE — Progress Notes (Signed)
Pt has been visible on the hall, but is programming on the 400 hall.  He was moved to the 300 hall d/t the toilet in his room being out of order.  He denies SI/HI/AVH.  He voices no needs or concerns during our conversation.  He gets things on his mind and likes to make notes.  He has been pleasant and cooperative.  His thoughts seem slightly clearer that over the weekend per staff who were here at that time.  Support and encouragement offered.  Pt makes his needs known to staff.  Discharge plans are in process.  Pt wants to stay until Friday so he can attend karaoke.  Safety maintained with q15 minute checks.

## 2017-04-19 ENCOUNTER — Encounter (HOSPITAL_COMMUNITY): Payer: Self-pay | Admitting: Behavioral Health

## 2017-04-19 NOTE — Progress Notes (Signed)
Recreation Therapy Notes  Date: 04/19/2017 Time: 9:30am Location: 300 Hall Dayroom  Group Topic: Stress Management  Goal Area(s) Addresses:  Patient will verbalize importance of using healthy stress management.  Patient will identify positive emotions associated with healthy stress management.   Behavioral Response: Engaged  Intervention: Stress Management  Activity : Calming Color Relaxation Visualization . Recreation Therapy Intern introduced the stress management technique of visualization. Recreation Therapy Intern read a script that allowed patients to mentally visualize different colors. Recreation Therapy Intern played calming flute music. Patients were to follow along as script was read to engage in the activity.  Education: Stress Management, Discharge Planning.   Education Outcome: Acknowledges edcuation  Clinical Observations/Feedback: Pt attended group.  Donovan Kail, Recreation Therapy Intern   Victorino Sparrow, LRT/CTRS        Donovan Kail 04/19/2017 12:07 PM

## 2017-04-19 NOTE — Progress Notes (Signed)
Patient discharged to lobby. Patient was stable and appreciative at that time. All papers and prescriptions were given and valuables returned. Verbal understanding expressed. Denies SI/HI and A/VH. Patient given opportunity to express concerns and ask questions.  

## 2017-04-19 NOTE — Progress Notes (Signed)
Pt reports he is doing better.  His thought processes seem to be clearer, and he has not displayed manic behaviors in a few days.  He denies depression or anxiety.  He denies SI/HI/AVH.  He did come to the med window tonight stating that he was afraid of serial killers.  When writer questioned him about this, he then said, "I guess I don't need to be afraid in here.  There is no killers in here."   Pt was given reassurance that he was safe.  Support and encouragement offered.  Discharge plans are in process, and pt may return home sometime on Wednesday per shift report.  Safety maintained with q15 minute checks.

## 2017-04-19 NOTE — [facility] Suicide Risk Assessment (Signed)
[facility] Discharge Suicide Risk Assessment   Principal Problem: Bipolar 1 disorder [place]) Discharge Diagnoses:  Patient Active Problem List   Diagnosis Date Noted  . Hyponatremia [E87.1] 04/12/2017  . Bipolar 1 disorder ([place]) [F31.9] 04/11/2017  . Medicare annual wellness visit, initial [Z00.00] 03/03/2015  . Advanced care planning/counseling discussion [Z71.89] 03/03/2015  . Health maintenance examination [Z00.00] 01/27/2014  . Pes planus [M21.40] 01/11/2008  . Osteoporosis [M81.0] 09/04/2007  . Ex-smoker [U31.497] 09/04/2007  . Hematuria [R31.9] 07/18/2007  . Benign prostatic hyperplasia [N40.0] 07/18/2007    Total Time spent with patient: 30 minutes  Musculoskeletal: Strength & Muscle Tone: within normal limits Gait & Station: normal Patient leans: N/A  Psychiatric Specialty Exam: ROS denies dizziness or lightheadedness today, denies chest pain, no shortness of breath, no vomiting   Blood pressure 139/89, pulse 80, temperature 97.9 F (36.6 C), temperature source Oral, resp. rate 16, height 5\' 9"  (1.753 m), weight 84.4 kg (186 lb), SpO2 100 %.Body mass index is 27.47 kg/m.  General Appearance: Well Groomed  Engineer, water::  Good  Speech:  Normal Rate - no longer pressured   Volume:  Normal  Mood:  reports he is feeling " all right", denies depression  Affect:  slightly anxious, reactive , less expansive   Thought Process: has become more linear, better organized , associations - better organized, less loose   Orientation:  Full (Time, Place, and Person)  Thought Content:  denies hallucinations, no delusions expressed, currently not expressing grandiose ideations  Suicidal Thoughts:  No denies any suicidal or self injurious ideations, denies any homicidal ideations  Homicidal Thoughts:  No  Memory:  recent and remote grossly intact   Judgement:  Fair- improving   Insight:  Fair- improving   Psychomotor Activity:  Normal  Concentration:  Good  Recall:  Good  Fund of  Knowledge:Good  Language: Good  Akathisia:  Negative  Handed:  Right  AIMS (if indicated):     Assets:  Desire for Improvement Resilience  Sleep:  Number of Hours: 5.75  Cognition: WNL  ADL's:  Intact   Mental Status Per Nursing Assessment::   On Admission:  NA  Demographic Factors:  66 year old single male, lives alone, on disability  Loss Factors: History of mental illness, disability, limited support network other than family   Historical Factors: History of Bipolar Disorder. In the past has also been diagnosed with Schizophrenia. Prior psychiatric admissions but last one had been 1990.  Risk Reduction Factors:   Sense of responsibility to family and Positive coping skills or problem solving skills  Continued Clinical Symptoms:  At this time patient is alert, attentive, well related, pleasant, good eye contact, symptoms of mania have improved - he is less pressured, less intrusive, less expansive in affect, better organized in thought process ,not currently expressing grandiose ideations, he is also sleeping better. Denies SI or HI, denies hallucinations, and does not appear internally preoccupied  Denies medication side effects. Patient is reporting he feels ready for discharge today and is requesting to be discharged  Hyponatremia has improved significantly and on 7/23 Na+ was 134    Cognitive Features That Contribute To Risk:  No gross cognitive deficits noted upon discharge. Is alert , attentive, and oriented x 3   Suicide Risk:  Mild:  Suicidal ideation of limited frequency, intensity, duration, and specificity.  There are no identifiable plans, no associated intent, mild dysphoria and related symptoms, good self-control (both objective and subjective assessment), few other risk factors, and identifiable  protective factors, including available and accessible social support.  Follow-up Information    [facility] Follow up on 04/20/2017.   Specialty:  Behavioral  Health Why:  Hospital discharge follow up appointment Dr Josph Macho on 7/26 at 11:40 AM.  Please call to cancel/reschedule if needed. Bring discharge paperwork.  Contact information: [place] [address] [phone]           Plan Of Care/Follow-up recommendations:  Activity:  as tolerated  Diet:  Regular Tests:  NA Other:  See below  Patient is requesting discharge- no grounds for involuntary commitment at this time, leaving unit in good spirits States family member will pick him up later today  Patient to follow up as above  Also continue to see PCP for medical management as needed   Jenne Campus, MD 04/19/2017, 10:15 AM

## 2017-04-20 DIAGNOSIS — F250 Schizoaffective disorder, bipolar type: Secondary | ICD-10-CM | POA: Diagnosis not present

## 2017-04-24 ENCOUNTER — Encounter: Payer: Self-pay | Admitting: Family Medicine

## 2017-04-24 ENCOUNTER — Ambulatory Visit (INDEPENDENT_AMBULATORY_CARE_PROVIDER_SITE_OTHER): Payer: Medicare HMO | Admitting: Family Medicine

## 2017-04-24 ENCOUNTER — Ambulatory Visit (HOSPITAL_COMMUNITY)
Admission: RE | Admit: 2017-04-24 | Discharge: 2017-04-24 | Disposition: A | Discharge status: Home / Self Care | Payer: Medicare HMO | Attending: Psychiatry | Admitting: Psychiatry

## 2017-04-24 VITALS — BP 128/66 | HR 77 | Temp 98.1°F | Wt 185.5 lb

## 2017-04-24 DIAGNOSIS — F3189 Other bipolar disorder: Secondary | ICD-10-CM | POA: Diagnosis not present

## 2017-04-24 DIAGNOSIS — F200 Paranoid schizophrenia: Secondary | ICD-10-CM | POA: Insufficient documentation

## 2017-04-24 DIAGNOSIS — F319 Bipolar disorder, unspecified: Secondary | ICD-10-CM

## 2017-04-24 DIAGNOSIS — E871 Hypo-osmolality and hyponatremia: Secondary | ICD-10-CM

## 2017-04-24 MED ORDER — TAMSULOSIN HCL 0.4 MG PO CAPS: 0.4000 mg | ORAL_CAPSULE | Freq: Every day | ORAL | 11 refills | Status: DC

## 2017-04-24 NOTE — Patient Instructions (Addendum)
Continue current medicines including olanzapine 5mg  in the morning and 10mg  at night.  Continue flomax (tamsulosin) Labs today to check sodium levels. Return to see me in 1 month for followu p.  If bad thoughts returning, go to ER.

## 2017-04-24 NOTE — H&P (Signed)
Behavioral Health Medical Screening Exam  Robert Fowler is an 66 y.o. male with a PMH of paranoid schizophrenia and bipolar 1 disorder who arrived voluntarily to [facility] accompanied by his brother due to pressure from his mother to come in for evaluation for being hyper. Patient is alert, oriented x4 and in a very pleasant mood today. He said he doesn't need to be here but his mother wants him to come in for inpatient hospitalization. Patient stated that he is doing very well and adjusting well on his new medications. He denies any SI/HI/VAH. His brother also thinks that patient is doing well and does not need IP hospitalization at this time.   Total Time spent with patient: 30 minutes  Psychiatric Specialty Exam: Physical Exam  Constitutional: He is oriented to person, place, and time. He appears well-developed and well-nourished.  HENT:  Head: Normocephalic.  Eyes: Pupils are equal, round, and reactive to light.  Neck: Normal range of motion. Neck supple.  Cardiovascular: Normal rate and regular rhythm.   Respiratory: Effort normal and breath sounds normal.  GI: Soft. Bowel sounds are normal.  Genitourinary:  Genitourinary Comments: Deferred  Musculoskeletal: Normal range of motion.  Neurological: He is alert and oriented to person, place, and time.  Skin: Skin is warm and dry.    Review of Systems  Psychiatric/Behavioral: Negative for depression, hallucinations, memory loss, substance abuse and suicidal ideas. The patient is not nervous/anxious and does not have insomnia.   All other systems reviewed and are negative.   Blood pressure (!) 127/97, pulse 76, temperature 98.2 F (36.8 C), temperature source Oral, resp. rate 16, SpO2 100 %.There is no height or weight on file to calculate BMI.  General Appearance: Casual  Eye Contact:  Good  Speech:  Clear and Coherent and Pressured  Volume:  Normal  Mood:  Anxious  Affect:  Congruent  Thought Process:  Coherent, Goal Directed and  Descriptions of Associations: Circumstantial  Orientation:  Full (Time, Place, and Person)  Thought Content:  Paranoid Ideation  Suicidal Thoughts:  No  Homicidal Thoughts:  No  Memory:  Immediate;   Good Recent;   Good Remote;   Fair  Judgement:  Intact  Insight:  Present  Psychomotor Activity:  Increased  Concentration: Concentration: Good and Attention Span: Good  Recall:  Good  Fund of Knowledge:Good  Language: Good  Akathisia:  Negative  Handed:  Right  AIMS (if indicated):     Assets:  Communication Skills Desire for Improvement Financial Resources/Insurance Housing Leisure Time Physical Health Social Support  Sleep:       Musculoskeletal: Strength & Muscle Tone: within normal limits Gait & Station: normal Patient leans: N/A  Blood pressure (!) 127/97, pulse 76, temperature 98.2 F (36.8 C), temperature source Oral, resp. rate 16, SpO2 100 %.  Recommendations:   Based on my evaluation the patient does not appear to have an emergency medical condition.  Patient have up coming appointment with his psychiatrist in 4 weeks and agrees to follow up as scheduled.   Vicenta Aly, NP 04/24/2017, 12:28 PM

## 2017-04-24 NOTE — Assessment & Plan Note (Signed)
Update labs today. He is currently taking sodium chloride 1gm daily.

## 2017-04-24 NOTE — Assessment & Plan Note (Signed)
He prior carried diagnosis of paranoid schizophrenia but this has been removed from problem list. Now carries diagnosis of bipolar 1 disorder. Appreciate psych care of Dr Josph Macho at [facility].  He was not taking 2 tablets olanzapine at night but rather one. Advised he start taking as prescribed - 5mg  am /10mg  pm.  rec keep f/u with psych next month.

## 2017-04-24 NOTE — Progress Notes (Signed)
BP 128/66   Pulse 77   Temp 98.1 F (36.7 C) (Oral)   Wt 185 lb 8 oz (84.1 kg)   SpO2 93%   BMI 27.39 kg/m    CC: discuss meds Subjective:    Patient ID: Robert Fowler, male    DOB: [birth year], 66 y.o.   MRN: [id number]  HPI: Robert Fowler is a 66 y.o. male presenting on 04/24/2017 for Follow-up (discuss meds)   Recent behavioral health hospitalization 7/17-25/2018 for manic behaviors after watching the news. Records reviewed. He was also upset about concern over house finances.  Discharge meds - olanzapine 5mg  am 10mg  pm, ativan 1mg  PO TID PRN anxiety.   He did have some urinary retention while at [facility] so started on flomax for presumed BPH. Requests refill today. Nocturia x1.   Discharged to f/u with psych Dr Josph Macho at [facility] 04/20/2017. To see again in 4 wks.   Denies SI/HI. Denies AVH, delusions, paranoia.   Relevant past medical, surgical, family and social history reviewed and updated as indicated. Interim medical history since our last visit reviewed. Allergies and medications reviewed and updated. Outpatient Medications Prior to Visit  Medication Sig Dispense Refill  . acetaminophen (TYLENOL) 500 MG tablet Take 1,000 mg by mouth every 6 (six) hours as needed for moderate pain or headache.    . alendronate (FOSAMAX) 70 MG tablet Take 1 tablet (70 mg total) by mouth every 7 (seven) days. Take with a full glass of water on an empty stomach. 4 tablet 11  . aspirin 81 MG tablet Take 81 mg by mouth daily.    . benztropine (COGENTIN) 0.5 MG tablet Take 0.5 mg by mouth at bedtime.    Marland Kitchen CALCIUM CITRATE PO Take 1 tablet by mouth daily.    . Cholecalciferol (VITAMIN D) 2000 units CAPS Take 1 capsule by mouth daily.    . magnesium oxide (MAG-OX) 400 MG tablet Take 400 mg by mouth daily as needed.    . Melatonin 5 MG TABS Take 1-5 mg by mouth at bedtime as needed.     . Multiple Vitamin (MULTIVITAMIN) tablet Take 1 tablet by mouth daily.    Marland Kitchen neomycin-polymyxin-pramoxine (NEOSPORIN  PLUS) 1 % cream Apply 1 application topically 2 (two) times daily as needed (minor cuts/wounds).    . OLANZapine zydis (ZYPREXA) 5 MG disintegrating tablet Take 1 tablet (5 mg total) by mouth daily. Take 1 tablet by mouth in the morning. Take 2 tablets by mouth at bedtime. 90 tablet 0  . Omega-3 Fatty Acids (FISH OIL PO) Take 1,200 mg by mouth daily.    . sodium chloride 1 g tablet Take 1 tablet (1 g total) by mouth daily. 30 tablet 0  . tamsulosin (FLOMAX) 0.4 MG CAPS capsule Take 1 capsule (0.4 mg total) by mouth daily. 30 capsule 0   No facility-administered medications prior to visit.      Per HPI unless specifically indicated in ROS section below Review of Systems     Objective:    BP 128/66   Pulse 77   Temp 98.1 F (36.7 C) (Oral)   Wt 185 lb 8 oz (84.1 kg)   SpO2 93%   BMI 27.39 kg/m   Wt Readings from Last 3 Encounters:  04/24/17 185 lb 8 oz (84.1 kg)  04/11/17 186 lb (84.4 kg)  03/13/17 193 lb 12 oz (87.9 kg)    Physical Exam  Constitutional: He appears well-developed and well-nourished. No distress.  HENT:  Mouth/Throat: Oropharynx is  clear and moist. No oropharyngeal exudate.  Cardiovascular: Normal rate, regular rhythm, normal heart sounds and intact distal pulses.   No murmur heard. Pulmonary/Chest: Effort normal and breath sounds normal. No respiratory distress. He has no wheezes. He has no rales.  Musculoskeletal: He exhibits no edema.  Skin: Skin is warm and dry. No rash noted.  Psychiatric: His mood appears anxious. His speech is tangential. He is agitated and hyperactive. He is not aggressive. Thought content is not paranoid. He expresses no homicidal and no suicidal ideation.  Pressured speech but not rapid Tangential Mild agitation more than his normal  Nursing note and vitals reviewed.     Assessment & Plan:   Problem List Items Addressed This Visit    Bipolar 1 disorder ([place]) - Primary    He prior carried diagnosis of paranoid schizophrenia but  this has been removed from problem list. Now carries diagnosis of bipolar 1 disorder. Appreciate psych care of Dr Josph Macho at [facility].  He was not taking 2 tablets olanzapine at night but rather one. Advised he start taking as prescribed - 5mg  am /10mg  pm.  rec keep f/u with psych next month.       Hyponatremia    Update labs today. He is currently taking sodium chloride 1gm daily.       Relevant Orders   Basic metabolic panel       Follow up plan: Return if symptoms worsen or fail to improve.  [name], MD
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
## 2017-04-25 LAB — BASIC METABOLIC PANEL
BUN: 10 mg/dL (ref 6–23)
CALCIUM: 8.8 mg/dL (ref 8.4–10.5)
CO2: 27 mEq/L (ref 19–32)
CREATININE: 0.81 mg/dL (ref 0.40–1.50)
Chloride: 102 mEq/L (ref 96–112)
GFR: 101.6 mL/min (ref >60.00)
Glucose, Bld: 90 mg/dL (ref 70–99)
Potassium: 4.1 mEq/L (ref 3.5–5.1)
Sodium: 135 mEq/L (ref 135–145)

## 2017-04-26 ENCOUNTER — Encounter: Payer: Self-pay | Admitting: *Deleted

## 2017-05-01 ENCOUNTER — Encounter: Payer: Self-pay | Admitting: Family Medicine

## 2017-05-01 ENCOUNTER — Ambulatory Visit (INDEPENDENT_AMBULATORY_CARE_PROVIDER_SITE_OTHER): Payer: Medicare HMO | Admitting: Family Medicine

## 2017-05-01 ENCOUNTER — Ambulatory Visit (INDEPENDENT_AMBULATORY_CARE_PROVIDER_SITE_OTHER)
Admission: RE | Admit: 2017-05-01 | Discharge: 2017-05-01 | Disposition: A | Discharge status: Home / Self Care | Payer: Medicare HMO | Source: Ambulatory Visit | Attending: Family Medicine | Admitting: Family Medicine

## 2017-05-01 VITALS — BP 112/72 | HR 76 | Temp 98.2°F | Ht 69.5 in | Wt 185.8 lb

## 2017-05-01 DIAGNOSIS — M7052 Other bursitis of knee, left knee: Secondary | ICD-10-CM | POA: Diagnosis not present

## 2017-05-01 DIAGNOSIS — E871 Hypo-osmolality and hyponatremia: Secondary | ICD-10-CM | POA: Diagnosis not present

## 2017-05-01 DIAGNOSIS — M818 Other osteoporosis without current pathological fracture: Secondary | ICD-10-CM

## 2017-05-01 DIAGNOSIS — M7989 Other specified soft tissue disorders: Secondary | ICD-10-CM | POA: Diagnosis not present

## 2017-05-01 DIAGNOSIS — M2141 Flat foot [pes planus] (acquired), right foot: Secondary | ICD-10-CM | POA: Diagnosis not present

## 2017-05-01 DIAGNOSIS — M25572 Pain in left ankle and joints of left foot: Secondary | ICD-10-CM

## 2017-05-01 DIAGNOSIS — M2142 Flat foot [pes planus] (acquired), left foot: Secondary | ICD-10-CM

## 2017-05-01 DIAGNOSIS — W19XXXA Unspecified fall, initial encounter: Secondary | ICD-10-CM

## 2017-05-01 DIAGNOSIS — F319 Bipolar disorder, unspecified: Secondary | ICD-10-CM | POA: Diagnosis not present

## 2017-05-01 MED ORDER — DICLOFENAC SODIUM 1 % TD GEL: 1.0000 "application " | Freq: Three times a day (TID) | TRANSDERMAL | 1 refills | Status: DC

## 2017-05-01 NOTE — Assessment & Plan Note (Signed)
Reports compliance with calcium and vitamin D and fosamax.

## 2017-05-01 NOTE — Progress Notes (Addendum)
BP 112/72 (BP Location: Left Arm, Patient Position: Sitting)   Pulse 76   Temp 98.2 F (36.8 C) (Oral)   Ht 5' 9.5" (1.765 m)   Wt 185 lb 12.8 oz (84.3 kg)   SpO2 95%   BMI 27.04 kg/m    CC: L knee/ankle pain Subjective:    Patient ID: Robert Fowler, male    DOB: May 26, 1951, 66 y.o.   MRN: [id number]  HPI: Robert Fowler is a 66 y.o. male presenting on 05/01/2017 for Leg Swelling (Left knee and left ankle, Inner part of left knee is sore, Pt did fall x 2 days ago during the night Concerned that the Sodium medication he is on is the cause )   Here with mother today.  1 wk h/o L medial knee pain and ankle pain associated with swelling.  He has had increased falls recently. More unsteady when he gets up at night.  He remembers his falls, denies LOC.  Endorsing some hallucinations.  He has been taking olanzapine 1 tab am 2 tab pm  F/u appt with Dr Robert Fowler at [facility] later this month.  Lives alone.   Relevant past medical, surgical, family and social history reviewed and updated as indicated. Interim medical history since our last visit reviewed. Allergies and medications reviewed and updated. Outpatient Medications Prior to Visit  Medication Sig Dispense Refill  . acetaminophen (TYLENOL) 500 MG tablet Take 1,000 mg by mouth every 6 (six) hours as needed for moderate pain or headache.    . alendronate (FOSAMAX) 70 MG tablet Take 1 tablet (70 mg total) by mouth every 7 (seven) days. Take with a full glass of water on an empty stomach. 4 tablet 11  . aspirin 81 MG tablet Take 81 mg by mouth daily.    . benztropine (COGENTIN) 0.5 MG tablet Take 0.5 mg by mouth at bedtime.    Marland Kitchen CALCIUM CITRATE PO Take 1 tablet by mouth daily.    . Cholecalciferol (VITAMIN D) 2000 units CAPS Take 1 capsule by mouth daily.    . magnesium oxide (MAG-OX) 400 MG tablet Take 400 mg by mouth daily as needed.    . Melatonin 5 MG TABS Take 1-5 mg by mouth at bedtime as needed.     . Multiple Vitamin (MULTIVITAMIN)  tablet Take 1 tablet by mouth daily.    Marland Kitchen neomycin-polymyxin-pramoxine (NEOSPORIN PLUS) 1 % cream Apply 1 application topically 2 (two) times daily as needed (minor cuts/wounds).    . Omega-3 Fatty Acids (FISH OIL PO) Take 1,200 mg by mouth daily.    . tamsulosin (FLOMAX) 0.4 MG CAPS capsule Take 1 capsule (0.4 mg total) by mouth daily. 30 capsule 11  . OLANZapine zydis (ZYPREXA) 5 MG disintegrating tablet Take 1 tablet (5 mg total) by mouth daily. Take 1 tablet by mouth in the morning. Take 2 tablets by mouth at bedtime. 90 tablet 0  . sodium chloride 1 g tablet Take 1 tablet (1 g total) by mouth daily. 30 tablet 0   No facility-administered medications prior to visit.      Per HPI unless specifically indicated in ROS section below Review of Systems     Objective:    BP 112/72 (BP Location: Left Arm, Patient Position: Sitting)   Pulse 76   Temp 98.2 F (36.8 C) (Oral)   Ht 5' 9.5" (1.765 m)   Wt 185 lb 12.8 oz (84.3 kg)   SpO2 95%   BMI 27.04 kg/m   Wt Readings from Last  3 Encounters:  05/01/17 185 lb 12.8 oz (84.3 kg)  04/24/17 185 lb 8 oz (84.1 kg)  04/11/17 186 lb (84.4 kg)    Physical Exam  Constitutional: He appears well-developed and well-nourished. No distress.  HENT:  Mouth/Throat: Oropharynx is clear and moist. No oropharyngeal exudate.  Eyes: Pupils are equal, round, and reactive to light. Conjunctivae and EOM are normal. No scleral icterus.  Cardiovascular: Normal rate, regular rhythm, normal heart sounds and intact distal pulses.   No murmur heard. Pulmonary/Chest: Effort normal and breath sounds normal. No respiratory distress. He has no wheezes. He has no rales.  Musculoskeletal: He exhibits edema (marked swelling medial L ankle).  R knee WNL L knee - FROM. No popliteal fullness or joint line tenderness or patellar tenderness. Significantly tender to palpation at pes anserine bursa.  R foot - marked pes planus with collapse of long arch L foot - marked pes  planus with collapse of long arch, as well as marked swelling at medial ankle, discomfort to palpation posterior to malleolus. No pain with calcaneal squeeze or lower leg squeeze, no pain at achilles tendon, no pain at heel, no ligament laxity  Nursing note and vitals reviewed.  Results for orders placed or performed in visit on 10/93/23  Basic metabolic panel  Result Value Ref Range   Sodium 135 135 - 145 mEq/L   Potassium 4.1 3.5 - 5.1 mEq/L   Chloride 102 96 - 112 mEq/L   CO2 27 19 - 32 mEq/L   Glucose, Bld 90 70 - 99 mg/dL   BUN 10 6 - 23 mg/dL   Creatinine, Ser 0.81 0.40 - 1.50 mg/dL   Calcium 8.8 8.4 - 10.5 mg/dL   GFR 101.60 >60.00 mL/min   LEFT ANKLE COMPLETE - 3+ VIEW COMPARISON: None. FINDINGS: There is no evidence of fracture, dislocation, or joint effusion. The talar dome is intact. The ankle mortise is symmetric. There is no evidence of arthropathy or other focal bone abnormality. Plantar enthesophyte. Moderate medial and mild lateral malleolar soft tissue swelling IMPRESSION: Soft tissue swelling about the ankle, greater over the medial malleolus. No fracture. Electronically Signed By: Titus Dubin M.D. On: 05/01/2017 10:43  Orthostatics normal today.    Assessment & Plan:   Problem List Items Addressed This Visit    Acute left ankle pain - Primary    Marked swelling medial L ankle ?deltoid ligament sprain vs malleolar or talar bony injury in setting of recent falls at night time - given area of pain and degree of swelling, did recommend xray today to r/o fracture. Also will recommend leg elevation, ice, rest. Update if ongoing falls.       Relevant Orders   DG Ankle Complete Left (Completed)   Bipolar 1 disorder ([place])    He never increased olanzapine to 5/10mg  daily - now with concern over falls at night I did recommend we stay on 5mg  bid dose. If ongoing hallucinations/racing thoughts, I did recommend he call to f/u sooner with psych.       Falls      Endorsing increased falls at night time. ?med related - will not increase olanzapine at night. Consider flomax cause. Advised to update if persistent falls.      Hyponatremia    Continues sodium chloride 1gm daily, suggested decrease to 1gm MWF as seems to be maintaining.      Osteoporosis    Reports compliance with calcium and vitamin D and fosamax.       Pes  anserinus bursitis of left knee    Discussed with patient dx and treatment plans. Provided with exercises, rec voltaren gel, update if not improving with treatment.       Pes planus    Ongoing, severe. He declined SM referral, has self treated with insoles bought at [employer].          Follow up plan: Return if symptoms worsen or fail to improve.  [name], MD

## 2017-05-01 NOTE — Assessment & Plan Note (Signed)
Endorsing increased falls at night time. ?med related - will not increase olanzapine at night. Consider flomax cause. Advised to update if persistent falls.

## 2017-05-01 NOTE — Assessment & Plan Note (Addendum)
Continues sodium chloride 1gm daily, suggested decrease to 1gm MWF as seems to be maintaining.

## 2017-05-01 NOTE — Assessment & Plan Note (Signed)
Discussed with patient dx and treatment plans. Provided with exercises, rec voltaren gel, update if not improving with treatment.

## 2017-05-01 NOTE — Assessment & Plan Note (Signed)
Ongoing, severe. He declined SM referral, has self treated with insoles bought at [employer].

## 2017-05-01 NOTE — Assessment & Plan Note (Signed)
Marked swelling medial L ankle ?deltoid ligament sprain vs malleolar or talar bony injury in setting of recent falls at night time - given area of pain and degree of swelling, did recommend xray today to r/o fracture. Also will recommend leg elevation, ice, rest. Update if ongoing falls.

## 2017-05-01 NOTE — Assessment & Plan Note (Signed)
He never increased olanzapine to 5/10mg  daily - now with concern over falls at night I did recommend we stay on 5mg  bid dose. If ongoing hallucinations/racing thoughts, I did recommend he call to f/u sooner with psych.

## 2017-05-01 NOTE — Patient Instructions (Addendum)
Orthostatic vital signs today.  Continue olanzapine 5mg  twice daily. Xray of ankle today. I think you have knee bursitis - exercises provided today. Treat with voltaren gel to knee (tender area).  Let us know if not improving with treatment.  If ongoing racing thoughts or hallucinations, call for sooner appointment with Dr Josph Macho.

## 2017-05-09 ENCOUNTER — Telehealth: Payer: Self-pay

## 2017-05-09 NOTE — Telephone Encounter (Signed)
Pt left v/m; pt having knee pain (? Bursitis) and tylenol and voltaren cream is not helping the pain and pt wants to know if Dr Darnell Level would recommend pt to have a cortisone shot in the knee. Pt request cb.

## 2017-05-09 NOTE — Telephone Encounter (Signed)
Don't recommend steroid shot at this time. If he'd like would offer sports medicine appt with Dr Lorelei Pont for further evaluation if tylenol and voltaren gel haven't helped.

## 2017-05-09 NOTE — Telephone Encounter (Signed)
LM for pt to call and ask if he would make an appt with Dr Lorelei Pont to evaluated his knee pain, (per Dr Danise Mina)

## 2017-05-11 ENCOUNTER — Ambulatory Visit (INDEPENDENT_AMBULATORY_CARE_PROVIDER_SITE_OTHER): Payer: Medicare HMO | Admitting: Family Medicine

## 2017-05-11 ENCOUNTER — Encounter: Payer: Self-pay | Admitting: Family Medicine

## 2017-05-11 VITALS — BP 110/72 | HR 79 | Temp 97.9°F | Ht 69.5 in | Wt 189.0 lb

## 2017-05-11 DIAGNOSIS — F319 Bipolar disorder, unspecified: Secondary | ICD-10-CM | POA: Diagnosis not present

## 2017-05-11 DIAGNOSIS — M25562 Pain in left knee: Secondary | ICD-10-CM

## 2017-05-11 NOTE — Telephone Encounter (Signed)
Per chart, pt sched to be seen today, 8/16

## 2017-05-11 NOTE — Progress Notes (Signed)
Dr. Frederico Hamman T. Regino Fournet, MD, [facility] [place], [address] Phone: [phone] Fax: [phone]  05/11/2017  Patient: Robert Fowler, MRN: [id number], DOB: 06/04/1951, 66 y.o.  Primary Physician:  [name], MD   Chief Complaint  Patient presents with  . Acute Visit    Pt here today to discuss some left knee pain, he describes a tinder spot on the inner part of his left knee, on going for about a week,Pt states it feels warm to touch pt also states he has some restless legs, only thing he can think of that may caused some injury is him using his knee to closehis fridge    Subjective:   Robert Fowler is a 66 y.o. very pleasant male patient who presents with the following:  Has some bursitis / pain on L medial knee. Has been hurting for a about a week.   He has tried some Tylenol, as well as some topical Voltaren.  Relevant history from a psychiatric standpoint is that he was hospitalized about 1 month ago, and had multiple medication changes, and he still filled does not like he is stable. His sleep patterns are not normalized: "He power naps" now.   Past Medical History, Surgical History, Social History, Family History, Problem List, Medications, and Allergies have been reviewed and updated if relevant.  Patient Active Problem List   Diagnosis Date Noted  . Pes anserinus bursitis of left knee 05/01/2017  . Acute left ankle pain 05/01/2017  . Falls 05/01/2017  . Hyponatremia 04/12/2017  . Bipolar 1 disorder ([place]) 04/11/2017  . Medicare annual wellness visit, initial 03/03/2015  . Advanced care planning/counseling discussion 03/03/2015  . Health maintenance examination 01/27/2014  . Pes planus 01/11/2008  . Osteoporosis 09/04/2007  . Ex-smoker 09/04/2007  . Hematuria 07/18/2007  . Benign prostatic hyperplasia 07/18/2007    Past Medical History:  Diagnosis Date  . Arthritis   . BENIGN PROSTATIC HYPERTROPHY  07/18/2007  . Bipolar disorder ([place])   . Habitual alcohol use   . OSTEOPOROSIS 09/04/2007   DEXA -2.8 lumbar (03/2015)  . Pes planus   . Prediabetes 2014  . Schizophrenia, paranoid ([place])    schizoaffective disorder    Past Surgical History:  Procedure Laterality Date  . INGUINAL HERNIA REPAIR Right 08/2014   Dr Gershon Crane    Social History   Social History  . Marital status: Single    Spouse name: N/A  . Number of children: N/A  . Years of education: N/A   Occupational History  . Not on file.   Social History Main Topics  . Smoking status: Former Smoker    Quit date: 09/26/2006  . Smokeless tobacco: Never Used  . Alcohol use 2.4 oz/week    4 Glasses of wine per week  . Drug use: Yes    Types: Marijuana  . Sexual activity: Not on file   Other Topics Concern  . Not on file   Social History Narrative   Lives alone.     Mother and brother live nearby.   Occupation: Retail buyer at [employer]   Edu: 12th grade   Activity: mows yard   Diet: good water, fruits/vegetables daily      Disability evaluation - no disability (04/2014)    Family History  Problem Relation Age of Onset  . Alcohol abuse Father   . Alcohol abuse Maternal Grandmother   . COPD Mother        smoker  .  CAD Neg Hx   . Stroke Neg Hx   . Cancer Neg Hx   . Diabetes Neg Hx   . Hypertension Neg Hx     No Known Allergies  Medication list reviewed and updated in full in [place].  GEN: No fevers, chills. Nontoxic. Primarily MSK c/o today. MSK: Detailed in the HPI GI: tolerating PO intake without difficulty Neuro: No numbness, parasthesias, or tingling associated. Otherwise the pertinent positives of the ROS are noted above.   Objective:   Vitals:   05/11/17 1036  BP: 110/72  Pulse: 79  Temp: 97.9 F (36.6 C)  TempSrc: Oral  SpO2: 97%  Weight: 189 lb (85.7 kg)  Height: 5' 9.5" (1.765 m)    GEN: WDWN, NAD, Non-toxic, Alert & Oriented x 3 HEENT: Atraumatic, Normocephalic.  Ears  and Nose: No external deformity. EXTR: No clubbing/cyanosis/edema NEURO: Normal gait.  PSYCH: Normally interactive. Conversant. Not depressed or anxious appearing.  Calm demeanor.   Knee:  L Gait: Normal heel toe pattern ROM: 0-130 Effusion: neg Echymosis or edema: none Patellar tendon NT Painful PLICA: neg Patellar grind: negative Medial and lateral patellar facet loading: negative medial and lateral joint lines:NT Tender slightly caudal to the pes bursa but not on the joint line Mcmurray's neg Flexion-pinch neg Varus and valgus stress: stable Lachman: neg Ant and Post drawer: neg Hip abduction, IR, ER: WNL Hip flexion str: 5/5 Hip abd: 5/5 Quad: 5/5 VMO atrophy:No Hamstring concentric and eccentric: 5/5   Radiology:  Assessment and Plan:   Left medial knee pain  Bipolar 1 disorder ([place])  Probable tendinitis of one of the anserine tendons.   Potential risks outweigh benefits of steroid injection in this case.   Reassurance, ice, NSAIDS, time should improve.  Follow-up: No Follow-up on file.  Future Appointments Date Time Provider [place]  03/14/2018 8:15 AM Eustace Pen, LPN [facility]  03/14/2018 9:30 AM [name], MD [facility]   Signed,  Maud Deed. Daunte Oestreich, MD   Allergies as of 05/11/2017   No Known Allergies     Medication List       Accurate as of 05/11/17  1:04 PM. Always use your most recent med list.          acetaminophen 500 MG tablet Commonly known as:  TYLENOL Take 1,000 mg by mouth every 6 (six) hours as needed for moderate pain or headache.   alendronate 70 MG tablet Commonly known as:  FOSAMAX Take 1 tablet (70 mg total) by mouth every 7 (seven) days. Take with a full glass of water on an empty stomach.   aspirin 81 MG tablet Take 81 mg by mouth daily.   benztropine 0.5 MG tablet Commonly known as:  COGENTIN Take 0.5 mg by mouth at bedtime.   CALCIUM CITRATE PO Take 1 tablet by  mouth daily.   diclofenac sodium 1 % Gel Commonly known as:  VOLTAREN Apply 1 application topically 3 (three) times daily.   FISH OIL PO Take 1,200 mg by mouth daily.   magnesium oxide 400 MG tablet Commonly known as:  MAG-OX Take 400 mg by mouth daily as needed.   Melatonin 5 MG Tabs Take 1-5 mg by mouth at bedtime as needed.   multivitamin tablet Take 1 tablet by mouth daily.   neomycin-polymyxin-pramoxine 1 % cream Commonly known as:  NEOSPORIN PLUS Apply 1 application topically 2 (two) times daily as needed (minor cuts/wounds).   OLANZapine zydis 5 MG disintegrating tablet Commonly known  as:  ZYPREXA Take 1 tablet (5 mg total) by mouth 2 (two) times daily.   sodium chloride 1 g tablet Take 1 tablet (1 g total) by mouth every Monday, Wednesday, and Friday.   tamsulosin 0.4 MG Caps capsule Commonly known as:  FLOMAX Take 1 capsule (0.4 mg total) by mouth daily.   Vitamin D 2000 units Caps Take 1 capsule by mouth daily.

## 2017-05-17 DIAGNOSIS — F250 Schizoaffective disorder, bipolar type: Secondary | ICD-10-CM | POA: Diagnosis not present

## 2017-05-19 ENCOUNTER — Other Ambulatory Visit: Payer: Self-pay

## 2017-05-19 MED ORDER — TAMSULOSIN HCL 0.4 MG PO CAPS: 0.4000 mg | ORAL_CAPSULE | Freq: Every day | ORAL | 9 refills | Status: DC

## 2017-05-26 ENCOUNTER — Ambulatory Visit: Payer: Self-pay | Admitting: Family Medicine

## 2017-06-13 ENCOUNTER — Ambulatory Visit (INDEPENDENT_AMBULATORY_CARE_PROVIDER_SITE_OTHER): Payer: Medicare HMO | Admitting: Family Medicine

## 2017-06-13 ENCOUNTER — Encounter: Payer: Self-pay | Admitting: Family Medicine

## 2017-06-13 VITALS — BP 136/80 | HR 82 | Temp 98.1°F | Wt 195.2 lb

## 2017-06-13 DIAGNOSIS — H6501 Acute serous otitis media, right ear: Secondary | ICD-10-CM | POA: Diagnosis not present

## 2017-06-13 DIAGNOSIS — Z7289 Other problems related to lifestyle: Secondary | ICD-10-CM | POA: Diagnosis not present

## 2017-06-13 DIAGNOSIS — F1090 Alcohol use, unspecified, uncomplicated: Secondary | ICD-10-CM

## 2017-06-13 DIAGNOSIS — R0600 Dyspnea, unspecified: Secondary | ICD-10-CM | POA: Diagnosis not present

## 2017-06-13 DIAGNOSIS — M7052 Other bursitis of knee, left knee: Secondary | ICD-10-CM

## 2017-06-13 DIAGNOSIS — F319 Bipolar disorder, unspecified: Secondary | ICD-10-CM

## 2017-06-13 DIAGNOSIS — H6591 Unspecified nonsuppurative otitis media, right ear: Secondary | ICD-10-CM | POA: Insufficient documentation

## 2017-06-13 NOTE — Patient Instructions (Addendum)
Limit alcohol to no more than 2 glasses of wine per day.  Check with Behavioral health about local therapy options or daytime options.  For R ear - could try some nasal saline or nasal steroid (flonase) to help clear fluid from the ear.  Good to see you today, call us with questions.

## 2017-06-13 NOTE — Assessment & Plan Note (Signed)
ddx includes olanzapine use (10mg  at bedtime) vs OSA. Will continue to monitor for now.

## 2017-06-13 NOTE — Assessment & Plan Note (Signed)
He had increased alcohol again, but reviewed limits to alcohol use. Endorses stopping again. Will continue to monitor.

## 2017-06-13 NOTE — Assessment & Plan Note (Signed)
New olanzapine dose reviewed - 5/10mg  daily.  Encouraged close f/u with psych.  I suggested he check with Behavioral med about local support groups/group therapy opportunities.

## 2017-06-13 NOTE — Assessment & Plan Note (Signed)
This has improved.

## 2017-06-13 NOTE — Progress Notes (Signed)
BP 136/80 (BP Location: Left Arm, Patient Position: Sitting, Cuff Size: Normal)   Pulse 82   Temp 98.1 F (36.7 C) (Oral)   Wt 195 lb 4 oz (88.6 kg)   SpO2 95%   BMI 28.42 kg/m    CC: concerns over alcohol  Subjective:    Patient ID: Robert Fowler, male    DOB: [birth year], 66 y.o.   MRN: [id number]  HPI: Robert Fowler is a 66 y.o. male presenting on 06/13/2017 for Too much alcohol (States his sister and brother-in-law is concerned he drinks too much alcohol)   Recently quit drinking - under advise of sister and brother in law. He was drinking 1.5 boxes of wine per week - each box contained 34 5oz glasses of wine. He quit drinking Sunday. He only started drinking alcohol recently.   He previously was going to group therapy at [facility]. Wonders if he needs to return to these sessions.  He sees Dr Robert Fowler psych at [facility] - now sees NP next appt 07/21/2017.   Ongoing R earache after he used Qtips to clean out R ear.   Some nocturnal dyspnea, without chest pain or cough, no snoring. No known witnessed apneic episodes. No leg swelling. Rare daytime sleepiness.   Relevant past medical, surgical, family and social history reviewed and updated as indicated. Interim medical history since our last visit reviewed. Allergies and medications reviewed and updated. Outpatient Medications Prior to Visit  Medication Sig Dispense Refill  . acetaminophen (TYLENOL) 500 MG tablet Take 1,000 mg by mouth every 6 (six) hours as needed for moderate pain or headache.    . alendronate (FOSAMAX) 70 MG tablet Take 1 tablet (70 mg total) by mouth every 7 (seven) days. Take with a full glass of water on an empty stomach. 4 tablet 11  . aspirin 81 MG tablet Take 81 mg by mouth daily.    . benztropine (COGENTIN) 0.5 MG tablet Take 0.5 mg by mouth at bedtime.    Marland Kitchen CALCIUM CITRATE PO Take 1 tablet by mouth daily.    . Cholecalciferol (VITAMIN D) 2000 units CAPS Take 1 capsule by mouth daily.    . Melatonin 5 MG  TABS Take 1-5 mg by mouth at bedtime as needed.     . Multiple Vitamin (MULTIVITAMIN) tablet Take 1 tablet by mouth daily.    Marland Kitchen neomycin-polymyxin-pramoxine (NEOSPORIN PLUS) 1 % cream Apply 1 application topically 2 (two) times daily as needed (minor cuts/wounds).    . Omega-3 Fatty Acids (FISH OIL PO) Take 1,200 mg by mouth daily.    . tamsulosin (FLOMAX) 0.4 MG CAPS capsule Take 1 capsule (0.4 mg total) by mouth daily. 30 capsule 9  . diclofenac sodium (VOLTAREN) 1 % GEL Apply 1 application topically 3 (three) times daily. 1 Tube 1  . magnesium oxide (MAG-OX) 400 MG tablet Take 400 mg by mouth daily as needed.    Marland Kitchen OLANZapine zydis (ZYPREXA) 5 MG disintegrating tablet Take 1 tablet (5 mg total) by mouth 2 (two) times daily.    . sodium chloride 1 g tablet Take 1 tablet (1 g total) by mouth every Monday, Wednesday, and Friday.     No facility-administered medications prior to visit.      Per HPI unless specifically indicated in ROS section below Review of Systems     Objective:    BP 136/80 (BP Location: Left Arm, Patient Position: Sitting, Cuff Size: Normal)   Pulse 82   Temp 98.1 F (36.7 C) (Oral)  Wt 195 lb 4 oz (88.6 kg)   SpO2 95%   BMI 28.42 kg/m   Wt Readings from Last 3 Encounters:  06/13/17 195 lb 4 oz (88.6 kg)  05/11/17 189 lb (85.7 kg)  05/01/17 185 lb 12.8 oz (84.3 kg)    Physical Exam  Constitutional: He is oriented to person, place, and time. He appears well-developed and well-nourished. No distress.  HENT:  Mouth/Throat: Oropharynx is clear and moist. No oropharyngeal exudate.  Eyes: Pupils are equal, round, and reactive to light. Conjunctivae and EOM are normal. No scleral icterus.  Neck: Normal range of motion. Neck supple. No thyromegaly present.  Cardiovascular: Normal rate, regular rhythm, normal heart sounds and intact distal pulses.   No murmur heard. Pulmonary/Chest: Effort normal and breath sounds normal. No respiratory distress. He has no wheezes.  He has no rales.  Musculoskeletal: He exhibits no edema.  Lymphadenopathy:    He has no cervical adenopathy.  Neurological: He is alert and oriented to person, place, and time.  Skin: Skin is warm and dry. No rash noted.  Psychiatric: He has a normal mood and affect. His speech is normal and behavior is normal. Judgment and thought content normal. Cognition and memory are normal.  Rapid speech, circumferential  Nursing note and vitals reviewed.  Results for orders placed or performed in visit on 75/88/32  Basic metabolic panel  Result Value Ref Range   Sodium 135 135 - 145 mEq/L   Potassium 4.1 3.5 - 5.1 mEq/L   Chloride 102 96 - 112 mEq/L   CO2 27 19 - 32 mEq/L   Glucose, Bld 90 70 - 99 mg/dL   BUN 10 6 - 23 mg/dL   Creatinine, Ser 0.81 0.40 - 1.50 mg/dL   Calcium 8.8 8.4 - 10.5 mg/dL   GFR 101.60 >60.00 mL/min      Assessment & Plan:   Problem List Items Addressed This Visit    Bipolar 1 disorder ([place])    New olanzapine dose reviewed - 5/10mg  daily.  Encouraged close f/u with psych.  I suggested he check with Behavioral med about local support groups/group therapy opportunities.       Habitual alcohol use - Primary    He had increased alcohol again, but reviewed limits to alcohol use. Endorses stopping again. Will continue to monitor.       Nocturnal dyspnea    ddx includes olanzapine use (10mg  at bedtime) vs OSA. Will continue to monitor for now.       Pes anserinus bursitis of left knee    This has improved.      Right serous otitis media    No signs of infection. Recommended nasal saline, nasal steroid, udpate if not improving with treatment.           Follow up plan: Return if symptoms worsen or fail to improve.  [name], MD

## 2017-06-13 NOTE — Assessment & Plan Note (Addendum)
No signs of infection. Recommended nasal saline, nasal steroid, udpate if not improving with treatment.

## 2017-06-28 ENCOUNTER — Telehealth: Payer: Self-pay | Admitting: Family Medicine

## 2017-06-28 ENCOUNTER — Ambulatory Visit
Admission: RE | Admit: 2017-06-28 | Discharge: 2017-06-28 | Disposition: A | Discharge status: Home / Self Care | Payer: Medicare HMO | Source: Ambulatory Visit | Attending: Family Medicine | Admitting: Family Medicine

## 2017-06-28 ENCOUNTER — Ambulatory Visit (INDEPENDENT_AMBULATORY_CARE_PROVIDER_SITE_OTHER): Payer: Medicare HMO | Admitting: Family Medicine

## 2017-06-28 ENCOUNTER — Encounter: Payer: Self-pay | Admitting: Family Medicine

## 2017-06-28 VITALS — BP 120/78 | HR 71 | Temp 98.1°F | Wt 199.0 lb

## 2017-06-28 DIAGNOSIS — M79662 Pain in left lower leg: Secondary | ICD-10-CM

## 2017-06-28 DIAGNOSIS — M7989 Other specified soft tissue disorders: Secondary | ICD-10-CM

## 2017-06-28 NOTE — Assessment & Plan Note (Signed)
Will need to r/o DVT - send for STAT vascular ultrasound at [facility] per pt preference. DDx includes ruptured popliteal cyst. Aware if DVT, will need anticoagulation. Will ask for call report. Pt and mother agree with plan.

## 2017-06-28 NOTE — Telephone Encounter (Signed)
[facility]  Patient Name: JAILYN LANGHORST  DOB: 20-Nov-1950    Initial Comment Caller is having leg, foot, and ankle swelling.    Nurse Assessment  Nurse: Harlow Mares, RN, Suanne Marker Date/Time (Eastern Time): 06/28/2017 8:19:48 AM  Confirm and document reason for call. If symptomatic, describe symptoms. ---Caller is having leg, foot, and ankle swelling on the left leg.  Does the patient have any new or worsening symptoms? ---Yes  Will a triage be completed? ---Yes  Related visit to physician within the last 2 weeks? ---No  Does the PT have any chronic conditions? (i.e. diabetes, asthma, etc.) ---Yes  List chronic conditions. ---mental health issues;  Is this a behavioral health or substance abuse call? ---No     Guidelines    Guideline Title Affirmed Question Affirmed Notes  Leg Swelling and Edema [1] Difficulty breathing with exertion (e.g., walking) AND [2] new onset or worsening    Final Disposition User   Go to ED Now (or PCP triage) Harlow Mares, RN, [facility] Disagree/Comply [place] Understands Yes  PreDisposition Did not know what to do

## 2017-06-28 NOTE — Telephone Encounter (Signed)
Pt has appt with Dr Darnell Level 06/28/17 at 3 pm.

## 2017-06-28 NOTE — Progress Notes (Signed)
BP 120/78 (BP Location: Left Arm, Patient Position: Sitting, Cuff Size: Normal)   Pulse 71   Temp 98.1 F (36.7 C) (Oral)   Wt 199 lb (90.3 kg)   SpO2 98%   BMI 28.97 kg/m    CC: leg swelling Subjective:    Patient ID: Martavis Gurney, male    DOB: [birth year], 66 y.o.   MRN: [id number]  HPI: Mounir Skipper is a 66 y.o. male presenting on 06/28/2017 for Leg Swelling (in LLE. Has a bruise-looking area on posterior lower part of upper leg. Started 06/24/17. Was doing yardwork the day before. Not sure if he was bitten by something.) and Foot Swelling (Started last night. Has bruise-like area on medial left heel)   Here with mother. 5d h/o L leg swelling that started in calf and has progressed to ankle and foot.  He has been more active recently. He has also tried to keep left foot elevated.  Denies recent prolonged immobility. Not on hormonal therapy. No personal history of blood clots.  No recent med changes.  Denies leg injury, falls, fevers, redness or warmth of leg.  No significant pain but leg feels tight and sore.   He did [employer] yard (Chiropractor) then sat on bucket of cans for the drive back for 9-38 min.  He started vitamin E recently.  Relevant past medical, surgical, family and social history reviewed and updated as indicated. Interim medical history since our last visit reviewed. Allergies and medications reviewed and updated. Outpatient Medications Prior to Visit  Medication Sig Dispense Refill  . acetaminophen (TYLENOL) 500 MG tablet Take 1,000 mg by mouth every 6 (six) hours as needed for moderate pain or headache.    . alendronate (FOSAMAX) 70 MG tablet Take 1 tablet (70 mg total) by mouth every 7 (seven) days. Take with a full glass of water on an empty stomach. 4 tablet 11  . aspirin 81 MG tablet Take 81 mg by mouth daily.    . benztropine (COGENTIN) 0.5 MG tablet Take 0.5 mg by mouth at bedtime.    Marland Kitchen CALCIUM CITRATE PO Take 1 tablet by mouth daily.    . Cholecalciferol  (VITAMIN D) 2000 units CAPS Take 1 capsule by mouth daily.    . Melatonin 5 MG TABS Take 1-5 mg by mouth at bedtime as needed.     . Multiple Vitamin (MULTIVITAMIN) tablet Take 1 tablet by mouth daily.    Marland Kitchen neomycin-polymyxin-pramoxine (NEOSPORIN PLUS) 1 % cream Apply 1 application topically 2 (two) times daily as needed (minor cuts/wounds).    . OLANZapine (ZYPREXA) 10 MG tablet Take 1 tablet (10 mg total) by mouth at bedtime.    Marland Kitchen OLANZapine zydis (ZYPREXA) 5 MG disintegrating tablet Take 1 tablet (5 mg total) by mouth daily.    . Omega-3 Fatty Acids (FISH OIL PO) Take 1,200 mg by mouth daily.    . tamsulosin (FLOMAX) 0.4 MG CAPS capsule Take 1 capsule (0.4 mg total) by mouth daily. 30 capsule 9   No facility-administered medications prior to visit.      Per HPI unless specifically indicated in ROS section below Review of Systems     Objective:    BP 120/78 (BP Location: Left Arm, Patient Position: Sitting, Cuff Size: Normal)   Pulse 71   Temp 98.1 F (36.7 C) (Oral)   Wt 199 lb (90.3 kg)   SpO2 98%   BMI 28.97 kg/m   Wt Readings from Last 3 Encounters:  06/28/17 199 lb (  90.3 kg)  06/13/17 195 lb 4 oz (88.6 kg)  05/11/17 189 lb (85.7 kg)    Physical Exam  Constitutional: He appears well-developed and well-nourished. No distress.  Musculoskeletal: Normal range of motion. He exhibits edema (1+ pedal edema below L knee).  Tight posterior left leg with fullness at popliteal area 2+ DP bilaterally, weaker on left but easily palpable.  Skin: Skin is warm and dry. Bruising noted. No erythema.     Bruising L posterior distal thigh into popliteal region  Nursing note and vitals reviewed.  Lab Results  Component Value Date   CREATININE 0.81 04/24/2017       Assessment & Plan:   Problem List Items Addressed This Visit    Pain and swelling of left lower leg - Primary    Will need to r/o DVT - send for STAT vascular ultrasound at [facility] per pt preference. DDx includes ruptured  popliteal cyst. Aware if DVT, will need anticoagulation. Will ask for call report. Pt and mother agree with plan.       Relevant Orders   US Venous Img Lower Unilateral Left   VAS [place] LOWER EXTREMITY VENOUS (DVT)       Follow up plan: Return if symptoms worsen or fail to improve.  [name], MD

## 2017-06-28 NOTE — Patient Instructions (Signed)
I also worry about either blood clot in the leg or possibly baker's cyst. See Rosaria Ferries to get set up for left leg ultrasound at [facility] today.  We will be in touch with results

## 2017-06-29 ENCOUNTER — Other Ambulatory Visit: Payer: Self-pay | Admitting: Family Medicine

## 2017-06-29 DIAGNOSIS — M7989 Other specified soft tissue disorders: Secondary | ICD-10-CM

## 2017-06-29 DIAGNOSIS — M25562 Pain in left knee: Secondary | ICD-10-CM | POA: Diagnosis not present

## 2017-06-29 DIAGNOSIS — M79662 Pain in left lower leg: Secondary | ICD-10-CM

## 2017-06-29 DIAGNOSIS — M660 Rupture of popliteal cyst: Secondary | ICD-10-CM

## 2017-07-05 DIAGNOSIS — H5213 Myopia, bilateral: Secondary | ICD-10-CM | POA: Diagnosis not present

## 2017-07-05 DIAGNOSIS — H2513 Age-related nuclear cataract, bilateral: Secondary | ICD-10-CM | POA: Diagnosis not present

## 2017-07-27 DIAGNOSIS — F250 Schizoaffective disorder, bipolar type: Secondary | ICD-10-CM | POA: Diagnosis not present

## 2017-07-27 DIAGNOSIS — Z79899 Other long term (current) drug therapy: Secondary | ICD-10-CM | POA: Diagnosis not present

## 2017-10-06 DIAGNOSIS — F250 Schizoaffective disorder, bipolar type: Secondary | ICD-10-CM | POA: Diagnosis not present

## 2017-10-13 DIAGNOSIS — F250 Schizoaffective disorder, bipolar type: Secondary | ICD-10-CM | POA: Diagnosis not present

## 2017-10-13 DIAGNOSIS — F1020 Alcohol dependence, uncomplicated: Secondary | ICD-10-CM | POA: Diagnosis not present

## 2018-01-02 DIAGNOSIS — F250 Schizoaffective disorder, bipolar type: Secondary | ICD-10-CM | POA: Diagnosis not present

## 2018-01-30 ENCOUNTER — Other Ambulatory Visit: Payer: Self-pay | Admitting: Family Medicine

## 2018-03-14 ENCOUNTER — Encounter: Payer: Medicare HMO | Admitting: Family Medicine

## 2018-03-14 ENCOUNTER — Ambulatory Visit (INDEPENDENT_AMBULATORY_CARE_PROVIDER_SITE_OTHER): Payer: Medicare HMO

## 2018-03-14 ENCOUNTER — Other Ambulatory Visit: Payer: Self-pay

## 2018-03-14 ENCOUNTER — Ambulatory Visit: Payer: Medicare HMO

## 2018-03-14 ENCOUNTER — Other Ambulatory Visit: Payer: Self-pay | Admitting: Family Medicine

## 2018-03-14 VITALS — BP 112/86 | HR 63 | Temp 98.0°F | Ht 69.5 in | Wt 199.5 lb

## 2018-03-14 DIAGNOSIS — M818 Other osteoporosis without current pathological fracture: Secondary | ICD-10-CM | POA: Diagnosis not present

## 2018-03-14 DIAGNOSIS — Z1211 Encounter for screening for malignant neoplasm of colon: Secondary | ICD-10-CM

## 2018-03-14 DIAGNOSIS — Z13220 Encounter for screening for lipoid disorders: Secondary | ICD-10-CM | POA: Diagnosis not present

## 2018-03-14 DIAGNOSIS — Z23 Encounter for immunization: Secondary | ICD-10-CM | POA: Diagnosis not present

## 2018-03-14 DIAGNOSIS — Z7289 Other problems related to lifestyle: Secondary | ICD-10-CM

## 2018-03-14 DIAGNOSIS — F1090 Alcohol use, unspecified, uncomplicated: Secondary | ICD-10-CM

## 2018-03-14 DIAGNOSIS — N400 Enlarged prostate without lower urinary tract symptoms: Secondary | ICD-10-CM

## 2018-03-14 DIAGNOSIS — E871 Hypo-osmolality and hyponatremia: Secondary | ICD-10-CM

## 2018-03-14 DIAGNOSIS — Z125 Encounter for screening for malignant neoplasm of prostate: Secondary | ICD-10-CM | POA: Diagnosis not present

## 2018-03-14 DIAGNOSIS — Z0000 Encounter for general adult medical examination without abnormal findings: Secondary | ICD-10-CM | POA: Diagnosis not present

## 2018-03-14 LAB — COMPREHENSIVE METABOLIC PANEL
ALBUMIN: 4.4 g/dL (ref 3.5–5.2)
ALK PHOS: 59 U/L (ref 39–117)
ALT: 12 U/L (ref 0–53)
AST: 9 U/L (ref 0–37)
BUN: 7 mg/dL (ref 6–23)
CALCIUM: 9.5 mg/dL (ref 8.4–10.5)
CHLORIDE: 98 meq/L (ref 96–112)
CO2: 31 mEq/L (ref 19–32)
Creatinine, Ser: 0.77 mg/dL (ref 0.40–1.50)
GFR: 107.42 mL/min (ref >60.00)
Glucose, Bld: 94 mg/dL (ref 70–99)
Potassium: 4.8 mEq/L (ref 3.5–5.1)
Sodium: 134 mEq/L — ABNORMAL LOW (ref 135–145)
TOTAL PROTEIN: 6.8 g/dL (ref 6.0–8.3)
Total Bilirubin: 0.7 mg/dL (ref 0.2–1.2)

## 2018-03-14 LAB — CBC WITH DIFFERENTIAL/PLATELET
BASOS PCT: 1.2 % (ref 0.0–3.0)
Basophils Absolute: 0 10*3/uL (ref 0.0–0.1)
EOS PCT: 3.7 % (ref 0.0–5.0)
Eosinophils Absolute: 0.1 10*3/uL (ref 0.0–0.7)
HEMATOCRIT: 42.9 % (ref 39.0–52.0)
Hemoglobin: 14.7 g/dL (ref 13.0–17.0)
Lymphocytes Relative: 26.2 % (ref 12.0–46.0)
Lymphs Abs: 1.1 10*3/uL (ref 0.7–4.0)
MCHC: 34.4 g/dL (ref 30.0–36.0)
MCV: 87.5 fl (ref 78.0–100.0)
MONO ABS: 0.4 10*3/uL (ref 0.1–1.0)
Monocytes Relative: 10.7 % (ref 3.0–12.0)
NEUTROS ABS: 2.3 10*3/uL (ref 1.4–7.7)
Neutrophils Relative %: 58.2 % (ref 43.0–77.0)
PLATELETS: 197 10*3/uL (ref 150.0–400.0)
RBC: 4.9 Mil/uL (ref 4.22–5.81)
RDW: 14.2 % (ref 11.5–15.5)
WBC: 4 10*3/uL (ref 4.0–10.5)

## 2018-03-14 LAB — LIPID PANEL
CHOLESTEROL: 174 mg/dL (ref 0–200)
HDL: 64.3 mg/dL (ref >39.00)
LDL CALC: 92 mg/dL (ref 0–99)
NonHDL: 109.67
TRIGLYCERIDES: 86 mg/dL (ref 0.0–149.0)
Total CHOL/HDL Ratio: 3
VLDL: 17.2 mg/dL (ref 0.0–40.0)

## 2018-03-14 LAB — VITAMIN D 25 HYDROXY (VIT D DEFICIENCY, FRACTURES): VITD: 44.33 ng/mL (ref 30.00–100.00)

## 2018-03-14 LAB — VITAMIN B12: Vitamin B-12: 611 pg/mL (ref 211–911)

## 2018-03-14 LAB — FOLATE

## 2018-03-14 LAB — TSH: TSH: 1.05 u[IU]/mL (ref 0.35–4.50)

## 2018-03-14 LAB — PSA, MEDICARE: PSA: 0.85 ng/ml (ref 0.10–4.00)

## 2018-03-14 NOTE — Progress Notes (Signed)
PCP notes:   Health maintenance:  Colon cancer screening - FOBT kit provided to patient PPSV23 - administered  Abnormal screenings:   Hearing - failed  Hearing Screening   _0  _1  _2  _3  _4  _5  _6  _7  _8   Right ear:   0 0 40  40    Left ear:   40 40 40  40     Fall risk - hx of multiple falls Fall Risk  03/14/2018 03/08/2017 03/07/2016 03/03/2015  Falls in the past year? Yes Yes No Yes  Comment per pt, multiple falls after behavioral health hospitalization pt states he was pulling weeds and fell backwards in yard; no injury, no medical treatment - -  Number falls in past yr: 2 or more 1 - 2 or more  Injury with Fall? No No - No  Risk for fall due to : History of falls Impaired balance/gait - (No Data)  Risk for fall due to: Comment - - - Mis-steps sometimes    Patient concerns:   Patient verbalized concerns with impulsive thoughts and anxiety. Recently began taking new medication and expressed Depakote has been effective.   Patient verbalized medication non-adherence to Fosamax and Flomax. Patient stated he thought Fosamax was a "salt" pill and he needed a refill for Flomax. PCP notified.  Patient verbalized concerns with a painful, raised in area on gums in lower left front area of mouth. Patient has requested a referral to dentistry. PCP notified.  Patient verbalized concerns with nailbeds splitting on both hands. Patient desires to discuss this issue with PCP at next appt. PCP notified.   Nurse concerns:  None  Next PCP appt:   03/21/18 @ 1030

## 2018-03-14 NOTE — Patient Instructions (Signed)
[name] , Thank you for taking time to come for your Medicare Wellness Visit. I appreciate your ongoing commitment to your health goals. Please review the following plan we discussed and let me know if I can assist you in the future.   These are the goals we discussed: Goals    . Patient Stated     Starting 03/14/2018, I will continue to take medications as prescribed.        This is a list of the screening recommended for you and due dates:  Health Maintenance  Topic Date Due  . Stool Blood Test  03/26/2019*  . Colon Cancer Screening  03/26/2019*  . Flu Shot  04/26/2018  . DTaP/Tdap/Td vaccine (2 - Td) 10/30/2023  . Tetanus Vaccine  10/30/2023  .  Hepatitis C: One time screening is recommended by Center for Disease Control  (CDC) for  adults born from [birth year] through 1964.   Completed  . Pneumonia vaccines  Completed  *Topic was postponed. The date shown is not the original due date.   Preventive Care for Adults  A healthy lifestyle and preventive care can promote health and wellness. Preventive health guidelines for adults include the following key practices.  . A routine yearly physical is a good way to check with your health care provider about your health and preventive screening. It is a chance to share any concerns and updates on your health and to receive a thorough exam.  . Visit your dentist for a routine exam and preventive care every 6 months. Brush your teeth twice a day and floss once a day. Good oral hygiene prevents tooth decay and gum disease.  . The frequency of eye exams is based on your age, health, family medical history, use  of contact lenses, and other factors. Follow your health care provider's recommendations for frequency of eye exams.  . Eat a healthy diet. Foods like vegetables, fruits, whole grains, low-fat dairy products, and lean protein foods contain the nutrients you need without too many calories. Decrease your intake of foods high in solid fats,  added sugars, and salt. Eat the right amount of calories for you. Get information about a proper diet from your health care provider, if necessary.  . Regular physical exercise is one of the most important things you can do for your health. Most adults should get at least 150 minutes of moderate-intensity exercise (any activity that increases your heart rate and causes you to sweat) each week. In addition, most adults need muscle-strengthening exercises on 2 or more days a week.  Silver Sneakers may be a benefit available to you. To determine eligibility, you may visit the website: www.silversneakers.com or contact program at 718-285-5974 Mon-Fri between 8AM-8PM.   . Maintain a healthy weight. The body mass index (BMI) is a screening tool to identify possible weight problems. It provides an estimate of body fat based on height and weight. Your health care provider can find your BMI and can help you achieve or maintain a healthy weight.   For adults 20 years and older: ? A BMI below 18.5 is considered underweight. ? A BMI of 18.5 to 24.9 is normal. ? A BMI of 25 to 29.9 is considered overweight. ? A BMI of 30 and above is considered obese.   . Maintain normal blood lipids and cholesterol levels by exercising and minimizing your intake of saturated fat. Eat a balanced diet with plenty of fruit and vegetables. Blood tests for lipids and cholesterol should begin  at age 21 and be repeated every 5 years. If your lipid or cholesterol levels are high, you are over 50, or you are at high risk for heart disease, you may need your cholesterol levels checked more frequently. Ongoing high lipid and cholesterol levels should be treated with medicines if diet and exercise are not working.  . If you smoke, find out from your health care provider how to quit. If you do not use tobacco, please do not start.  . If you choose to drink alcohol, please do not consume more than 2 drinks per day. One drink is  considered to be 12 ounces (355 mL) of beer, 5 ounces (148 mL) of wine, or 1.5 ounces (44 mL) of liquor.  . If you are 78-55 years old, ask your health care provider if you should take aspirin to prevent strokes.  . Use sunscreen. Apply sunscreen liberally and repeatedly throughout the day. You should seek shade when your shadow is shorter than you. Protect yourself by wearing long sleeves, pants, a wide-brimmed hat, and sunglasses year round, whenever you are outdoors.  . Once a month, do a whole body skin exam, using a mirror to look at the skin on your back. Tell your health care provider of new moles, moles that have irregular borders, moles that are larger than a pencil eraser, or moles that have changed in shape or color.

## 2018-03-14 NOTE — Telephone Encounter (Signed)
Patient has requested refill of Flomax be sent to [place], [place], [place].

## 2018-03-14 NOTE — Progress Notes (Signed)
Subjective:   Robert Fowler is a 67 y.o. male who presents for Medicare Annual/Subsequent preventive examination.  Review of Systems:  N/A Cardiac Risk Factors include: advanced age (>81men, >45 women);male gender     Objective:    Vitals: BP 112/86 (BP Location: Right Arm, Patient Position: Sitting, Cuff Size: Normal)   Pulse 63   Temp 98 F (36.7 C) (Oral)   Ht 5' 9.5" (1.765 m) Comment: no shoes  Wt 199 lb 8 oz (90.5 kg)   SpO2 95%   BMI 29.04 kg/m   Body mass index is 29.04 kg/m.  Advanced Directives 03/14/2018 04/11/2017 03/08/2017  Does Patient Have a Medical Advance Directive? Yes No Yes  Type of Paramedic of [place];Living will - [place];Living will  Copy of [place] in Chart? No - copy requested - No - copy requested  Would patient like information on creating a medical advance directive? - No - Patient declined -  Some encounter information is confidential and restricted. Go to Review Flowsheets activity to see all data.    Tobacco Social History   Tobacco Use  Smoking Status Former Smoker  . Last attempt to quit: 09/26/2006  . Years since quitting: 11.4  Smokeless Tobacco Never Used     Counseling given: No   Clinical Intake:  Pre-visit preparation completed: Yes  Pain : No/denies pain Pain Score: 0-No pain     Nutritional Status: BMI 25 -29 Overweight Nutritional Risks: None Diabetes: No  How often do you need to have someone help you when you read instructions, pamphlets, or other written materials from your doctor or pharmacy?: 1 - Never What is the last grade level you completed in school?: 12th grade  Interpreter Needed?: No  Comments: pt is single and lives alone Information entered by :: LPinson, LPN  Past Medical History:  Diagnosis Date  . Arthritis   . BENIGN PROSTATIC HYPERTROPHY 07/18/2007  . Bipolar disorder ([place])   . Habitual alcohol use   . OSTEOPOROSIS  09/04/2007   DEXA -2.8 lumbar (03/2015)  . Pes planus   . Prediabetes 2014  . Schizophrenia, paranoid ([place])    schizoaffective disorder   Past Surgical History:  Procedure Laterality Date  . INGUINAL HERNIA REPAIR Right 08/2014   Dr Gershon Crane   Family History  Problem Relation Age of Onset  . Alcohol abuse Father   . Alcohol abuse Maternal Grandmother   . COPD Mother        smoker  . CAD Neg Hx   . Stroke Neg Hx   . Cancer Neg Hx   . Diabetes Neg Hx   . Hypertension Neg Hx    Social History   Socioeconomic History  . Marital status: Single    Spouse name: Not on file  . Number of children: Not on file  . Years of education: Not on file  . Highest education level: Not on file  Occupational History  . Not on file  Social Needs  . Financial resource strain: Not on file  . Food insecurity:    Worry: Not on file    Inability: Not on file  . Transportation needs:    Medical: Not on file    Non-medical: Not on file  Tobacco Use  . Smoking status: Former Smoker    Last attempt to quit: 09/26/2006    Years since quitting: 11.4  . Smokeless tobacco: Never Used  Substance and Sexual Activity  . Alcohol  use: Yes    Alcohol/week: 2.4 oz    Types: 4 Glasses of wine per week  . Drug use: Yes    Types: Marijuana  . Sexual activity: Not Currently  Lifestyle  . Physical activity:    Days per week: Not on file    Minutes per session: Not on file  . Stress: Not on file  Relationships  . Social connections:    Talks on phone: Not on file    Gets together: Not on file    Attends religious service: Not on file    Active member of club or organization: Not on file    Attends meetings of clubs or organizations: Not on file    Relationship status: Not on file  Other Topics Concern  . Not on file  Social History Narrative   Lives alone.     Mother and brother live nearby.   Occupation: Retail buyer at [employer]   Edu: 12th grade   Activity: mows yard   Diet: good water,  fruits/vegetables daily      Disability evaluation - no disability (04/2014)    Outpatient Encounter Medications as of 03/14/2018  Medication Sig  . acetaminophen (TYLENOL) 500 MG tablet Take 1,000 mg by mouth every 6 (six) hours as needed for moderate pain or headache.  . alendronate (FOSAMAX) 70 MG tablet TAKE 1 TABLET BY MOUTH EVERY 7 DAYS WITH A FULL GLASS OF WATER ON AN EMPTY STOMACH  . aspirin 81 MG tablet Take 81 mg by mouth daily.  . benztropine (COGENTIN) 0.5 MG tablet Take 0.5 mg by mouth at bedtime.  Marland Kitchen CALCIUM CITRATE PO Take 1 tablet by mouth daily.  . Cholecalciferol (VITAMIN D) 2000 units CAPS Take 1 capsule by mouth daily.  . divalproex (DEPAKOTE ER) 250 MG 24 hr tablet   . FLUoxetine (PROZAC) 20 MG capsule   . hydrOXYzine (ATARAX/VISTARIL) 25 MG tablet   . Melatonin 5 MG TABS Take 1-5 mg by mouth at bedtime as needed.   . Multiple Vitamin (MULTIVITAMIN) tablet Take 1 tablet by mouth daily.  Marland Kitchen neomycin-polymyxin-pramoxine (NEOSPORIN PLUS) 1 % cream Apply 1 application topically 2 (two) times daily as needed (minor cuts/wounds).  . OLANZapine (ZYPREXA) 10 MG tablet Take 1 tablet (10 mg total) by mouth at bedtime.  Marland Kitchen OLANZapine zydis (ZYPREXA) 5 MG disintegrating tablet Take 1 tablet (5 mg total) by mouth daily.  . Omega-3 Fatty Acids (FISH OIL PO) Take 1,200 mg by mouth daily.  . tamsulosin (FLOMAX) 0.4 MG CAPS capsule Take 1 capsule (0.4 mg total) by mouth daily.   No facility-administered encounter medications on file as of 03/14/2018.     Activities of Daily Living In your present state of health, do you have any difficulty performing the following activities: 03/14/2018  Hearing? N  Vision? N  Difficulty concentrating or making decisions? Y  Walking or climbing stairs? N  Dressing or bathing? N  Doing errands, shopping? N  Preparing Food and eating ? N  Using the Toilet? N  In the past six months, have you accidently leaked urine? N  Do you have problems with loss  of bowel control? N  Managing your Medications? N  Managing your Finances? N  Housekeeping or managing your Housekeeping? N  Some encounter information is confidential and restricted. Go to Review Flowsheets activity to see all data.  Some recent data might be hidden    Patient Care Team: [name], MD as PCP - General (Family Medicine)  Assessment:   This is a routine wellness examination for Avelardo.   Hearing Screening   125Hz  250Hz  500Hz  1000Hz  2000Hz  3000Hz  4000Hz  6000Hz  8000Hz   Right ear:   0 0 40  40    Left ear:   40 40 40  40    Vision Screening Comments: October 2018 @ [facility]    Exercise Activities and Dietary recommendations Current Exercise Habits: The patient does not participate in regular exercise at present, Exercise limited by: None identified  Goals    . Patient Stated     Starting 03/14/2018, I will continue to take medications as prescribed.        Fall Risk Fall Risk  03/14/2018 03/08/2017 03/07/2016 03/03/2015  Falls in the past year? Yes Yes No Yes  Comment per pt, multiple falls after behavioral health hospitalization pt states he was pulling weeds and fell backwards in yard; no injury, no medical treatment - -  Number falls in past yr: 2 or more 1 - 2 or more  Injury with Fall? No No - No  Risk for fall due to : - Impaired balance/gait - (No Data)  Risk for fall due to: Comment - - - Mis-steps sometimes    Depression Screen PHQ 2/9 Scores 03/14/2018 03/13/2017 03/08/2017 03/07/2016  PHQ - 2 Score 0 0 0 0  PHQ- 9 Score 0 2 - -    Cognitive Function MMSE - Mini Mental State Exam 03/14/2018 03/08/2017  Orientation to time 5 5  Orientation to Place 5 5  Registration 3 3  Attention/ Calculation 0 0  Recall 3 1  Recall-comments - pt was unable to recall 2 of 3 words  Language- name 2 objects 0 0  Language- repeat 1 1  Language- follow 3 step command 3 3  Language- read & follow direction 0 0  Write a sentence 0 0  Copy design  0 0  Total score 20 18     PLEASE NOTE: A Mini-Cog screen was completed. Maximum score is 20. A value of 0 denotes this part of [place] MMSE was not completed or the patient failed this part of the Mini-Cog screening.   Mini-Cog Screening Orientation to Time - Max 5 pts Orientation to Place - Max 5 pts Registration - Max 3 pts Recall - Max 3 pts Language Repeat - Max 1 pts Language Follow 3 Step Command - Max 3 pts     Immunization History  Administered Date(s) Administered  . Influenza Whole 07/09/2008, 06/26/2013  . Influenza,inj,Quad PF,6+ Mos 07/30/2014, 08/09/2016  . Influenza-Unspecified 05/22/2017  . Pneumococcal Conjugate-13 03/08/2017  . Pneumococcal Polysaccharide-23 03/14/2018  . Tdap 10/29/2013  . Zoster 07/30/2014   Screening Tests Health Maintenance  Topic Date Due  . COLON CANCER SCREENING ANNUAL FOBT  03/26/2019 (Originally 03/13/2018)  . COLONOSCOPY  03/26/2019 (Originally 03/02/2002)  . INFLUENZA VACCINE  04/26/2018  . DTaP/Tdap/Td (2 - Td) 10/30/2023  . TETANUS/TDAP  10/30/2023  . Hepatitis C Screening  Completed  . PNA vac Low Risk Adult  Completed      Plan:     I have personally reviewed, addressed, and noted the following in the patient's chart:  A. Medical and social history B. Use of alcohol, tobacco or illicit drugs  C. Current medications and supplements D. Functional ability and status E.  Nutritional status F.  Physical activity G. Advance directives H. List of other physicians I.  Hospitalizations, surgeries, and ER visits in previous 12 months J.  Vitals K. Screenings  to include hearing, vision, cognitive, depression L. Referrals and appointments - none  In addition, I have reviewed and discussed with patient certain preventive protocols, quality metrics, and best practice recommendations. A written personalized care plan for preventive services as well as general preventive health recommendations were provided to patient.  See  attached scanned questionnaire for additional information.   Signed,   Lindell Noe, MHA, BS, LPN Health Coach

## 2018-03-16 ENCOUNTER — Other Ambulatory Visit: Payer: Medicare HMO

## 2018-03-16 MED ORDER — TAMSULOSIN HCL 0.4 MG PO CAPS: 0.4000 mg | ORAL_CAPSULE | Freq: Every day | ORAL | 9 refills | Status: DC

## 2018-03-18 NOTE — Progress Notes (Signed)
I reviewed health advisor's note, was available for consultation, and agree with documentation and plan.  

## 2018-03-21 ENCOUNTER — Ambulatory Visit (INDEPENDENT_AMBULATORY_CARE_PROVIDER_SITE_OTHER): Payer: Medicare HMO | Admitting: Family Medicine

## 2018-03-21 ENCOUNTER — Encounter: Payer: Self-pay | Admitting: Family Medicine

## 2018-03-21 VITALS — BP 120/82 | HR 77 | Temp 98.1°F | Ht 69.5 in | Wt 201.5 lb

## 2018-03-21 DIAGNOSIS — F259 Schizoaffective disorder, unspecified: Secondary | ICD-10-CM | POA: Insufficient documentation

## 2018-03-21 DIAGNOSIS — F1090 Alcohol use, unspecified, uncomplicated: Secondary | ICD-10-CM

## 2018-03-21 DIAGNOSIS — N400 Enlarged prostate without lower urinary tract symptoms: Secondary | ICD-10-CM

## 2018-03-21 DIAGNOSIS — F250 Schizoaffective disorder, bipolar type: Secondary | ICD-10-CM | POA: Insufficient documentation

## 2018-03-21 DIAGNOSIS — Z7189 Other specified counseling: Secondary | ICD-10-CM

## 2018-03-21 DIAGNOSIS — K1370 Unspecified lesions of oral mucosa: Secondary | ICD-10-CM

## 2018-03-21 DIAGNOSIS — Z0000 Encounter for general adult medical examination without abnormal findings: Secondary | ICD-10-CM

## 2018-03-21 DIAGNOSIS — F319 Bipolar disorder, unspecified: Secondary | ICD-10-CM

## 2018-03-21 DIAGNOSIS — M818 Other osteoporosis without current pathological fracture: Secondary | ICD-10-CM

## 2018-03-21 DIAGNOSIS — Z7289 Other problems related to lifestyle: Secondary | ICD-10-CM

## 2018-03-21 HISTORY — DX: Unspecified lesions of oral mucosa: K13.70

## 2018-03-21 NOTE — Addendum Note (Signed)
Addended by: [name] on: 03/21/2018 02:36 PM   Modules accepted: Orders

## 2018-03-21 NOTE — Assessment & Plan Note (Signed)
From history, seems to be present over the last year, slowly growing. Affects denture fit. Will refer to oral surgeon for eval.

## 2018-03-21 NOTE — Assessment & Plan Note (Signed)
Stable exam. Continue flomax.

## 2018-03-21 NOTE — Assessment & Plan Note (Signed)
Chronic, continue calcium, vit D, and fosamax.

## 2018-03-21 NOTE — Progress Notes (Addendum)
BP 120/82 (BP Location: Left Arm, Patient Position: Sitting, Cuff Size: Normal)   Pulse 77   Temp 98.1 F (36.7 C) (Oral)   Ht 5\' 10"  (1.778 m)   Wt 201 lb 8 oz (91.4 kg)   SpO2 96%   BMI 28.91 kg/m    CC: CPE Subjective:    Patient ID: Najee Manninen, male    DOB: Apr 15, 1951, 67 y.o.   MRN: [id number]  HPI: Kaelem Brach is a 67 y.o. male presenting on 03/21/2018 for Annual Exam (Pt 2.)   Saw Katha Cabal last week for medicare wellness visit. Note reviewed. Failed hearing screen on right - he feels R ear stopped up. Enjoys adult coloring book as hobby and relaxation technique.  Oral lesion - noticed since last year - dentist recommended oral surgery - has not seen. Has not recently seen dentist.   Preventative: Colon cancer screening - discussed, would like iFOB - provided last week and returned today.  Prostate cancer screening - discussed, would like to have this checked.H/o BPH per chart. Some incomplete emptying, nocturia x1, on flomax.  Osteoporosis DEXA -2.8 lumbar (03/2015) - fosamax started 03/2017. Advised to let us know if jaw or hip pain.  Flu shot yearly at pharmacy  prevnar 02/2017, pneumovax 02/2018 Tdap 10/2013  zostavax - 07/2014  Shingrix - discussed  Advanced directive: brings packet. Scanned into chart 02/2017. Mother Adonis Huguenin then sister Eustaquio Maize are HCPOA.  Seat belt use discussed.  Sunscreen use discussed. No changing moles on skin.  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 Interim medical history since our last visit reviewed. Allergies and medications reviewed and updated. Outpatient  Medications Prior to Visit  Medication Sig Dispense Refill  . acetaminophen (TYLENOL) 500 MG tablet Take 1,000 mg by mouth every 6 (six) hours as needed for moderate pain or headache.    . alendronate (FOSAMAX) 70 MG tablet TAKE 1 TABLET BY MOUTH EVERY 7 DAYS WITH A FULL GLASS OF WATER ON AN EMPTY STOMACH 4 tablet 11  . aspirin 81 MG tablet Take 81 mg by mouth daily.    . benztropine (COGENTIN) 0.5 MG tablet Take 0.5 mg by mouth at bedtime.    Marland Kitchen CALCIUM CITRATE PO Take 1 tablet by mouth daily.    . Cholecalciferol (VITAMIN D) 2000 units CAPS Take 1 capsule by mouth daily.    . divalproex (DEPAKOTE ER) 250 MG 24 hr tablet     . FLUoxetine (PROZAC) 20 MG capsule     . hydrOXYzine (ATARAX/VISTARIL) 25 MG tablet As needed    . Melatonin 5 MG TABS Take 1-5 mg by mouth at bedtime as needed.     . Multiple Vitamin (MULTIVITAMIN) tablet Take 1 tablet by mouth daily.    Marland Kitchen neomycin-polymyxin-pramoxine (NEOSPORIN PLUS) 1 % cream Apply 1 application topically 2 (two) times daily as needed (minor cuts/wounds).    . OLANZapine (ZYPREXA) 10 MG tablet Take 1 tablet (10 mg total) by mouth at bedtime.    Marland Kitchen OLANZapine zydis (ZYPREXA) 5 MG disintegrating tablet Take 1 tablet (5 mg total) by mouth daily.    . Omega-3 Fatty  Acids (FISH OIL PO) Take 1,200 mg by mouth daily.    . tamsulosin (FLOMAX) 0.4 MG CAPS capsule Take 1 capsule (0.4 mg total) by mouth daily. 30 capsule 9   No facility-administered medications prior to visit.      Per HPI unless specifically indicated in ROS section below Review of Systems     Objective:    BP 120/82 (BP Location: Left Arm, Patient Position: Sitting, Cuff Size: Normal)   Pulse 77   Temp 98.1 F (36.7 C) (Oral)   Ht 5\' 10"  (1.778 m)   Wt 201 lb 8 oz (91.4 kg)   SpO2 96%   BMI 28.91 kg/m   Wt Readings from Last 3 Encounters:  03/21/18 201 lb 8 oz (91.4 kg)  03/14/18 199 lb 8 oz (90.5 kg)  06/28/17 199 lb (90.3 kg)    Physical Exam  Constitutional: He is  oriented to person, place, and time. He appears well-developed and well-nourished. No distress.  HENT:  Head: Normocephalic and atraumatic.  Right Ear: Hearing, tympanic membrane, external ear and ear canal normal.  Left Ear: Hearing, tympanic membrane, external ear and ear canal normal.  Nose: Nose normal.  Mouth/Throat: Uvula is midline, oropharynx is clear and moist and mucous membranes are normal. No oropharyngeal exudate, posterior oropharyngeal edema or posterior oropharyngeal erythema.  Eyes: Pupils are equal, round, and reactive to light. Conjunctivae and EOM are normal. No scleral icterus.  Neck: Normal range of motion. Neck supple.  Cardiovascular: Normal rate, regular rhythm, normal heart sounds and intact distal pulses.  No murmur heard. Pulses:      Radial pulses are 2+ on the right side, and 2+ on the left side.  Pulmonary/Chest: Effort normal and breath sounds normal. No respiratory distress. He has no wheezes. He has no rales.  Abdominal: Soft. Bowel sounds are normal. He exhibits no distension and no mass. There is no tenderness. There is no rebound and no guarding.  Musculoskeletal: Normal range of motion. He exhibits no edema.  Thoracic scoliosis - L scapula higher riding than right - no pain  Lymphadenopathy:    He has no cervical adenopathy.  Neurological: He is alert and oriented to person, place, and time.  CN grossly intact, station and gait intact  Skin: Skin is warm and dry. No rash noted.  Psychiatric: He has a normal mood and affect. His behavior is normal. Judgment and thought content normal.  Nursing note and vitals reviewed.  Results for orders placed or performed in visit on 03/14/18  VITAMIN D 25 Hydroxy (Vit-D Deficiency, Fractures)  Result Value Ref Range   VITD 44.33 30.00 - 100.00 ng/mL  CBC with Differential/Platelet  Result Value Ref Range   WBC 4.0 4.0 - 10.5 K/uL   RBC 4.90 4.22 - 5.81 Mil/uL   Hemoglobin 14.7 13.0 - 17.0 g/dL   HCT 42.9 39.0  - 52.0 %   MCV 87.5 78.0 - 100.0 fl   MCHC 34.4 30.0 - 36.0 g/dL   RDW 14.2 11.5 - 15.5 %   Platelets 197.0 150.0 - 400.0 K/uL   Neutrophils Relative % 58.2 43.0 - 77.0 %   Lymphocytes Relative 26.2 12.0 - 46.0 %   Monocytes Relative 10.7 3.0 - 12.0 %   Eosinophils Relative 3.7 0.0 - 5.0 %   Basophils Relative 1.2 0.0 - 3.0 %   Neutro Abs 2.3 1.4 - 7.7 K/uL   Lymphs Abs 1.1 0.7 - 4.0 K/uL   Monocytes Absolute 0.4 0.1 - 1.0 K/uL  Eosinophils Absolute 0.1 0.0 - 0.7 K/uL   Basophils Absolute 0.0 0.0 - 0.1 K/uL  TSH  Result Value Ref Range   TSH 1.05 0.35 - 4.50 uIU/mL  Folate  Result Value Ref Range   Folate >24.1 >5.9 ng/mL  Vitamin B12  Result Value Ref Range   Vitamin B-12 611 211 - 911 pg/mL  PSA, Medicare  Result Value Ref Range   PSA 0.85 0.10 - 4.00 ng/ml  Comprehensive metabolic panel  Result Value Ref Range   Sodium 134 (L) 135 - 145 mEq/L   Potassium 4.8 3.5 - 5.1 mEq/L   Chloride 98 96 - 112 mEq/L   CO2 31 19 - 32 mEq/L   Glucose, Bld 94 70 - 99 mg/dL   BUN 7 6 - 23 mg/dL   Creatinine, Ser 0.77 0.40 - 1.50 mg/dL   Total Bilirubin 0.7 0.2 - 1.2 mg/dL   Alkaline Phosphatase 59 39 - 117 U/L   AST 9 0 - 37 U/L   ALT 12 0 - 53 U/L   Total Protein 6.8 6.0 - 8.3 g/dL   Albumin 4.4 3.5 - 5.2 g/dL   Calcium 9.5 8.4 - 10.5 mg/dL   GFR 107.42 >60.00 mL/min  Lipid panel  Result Value Ref Range   Cholesterol 174 0 - 200 mg/dL   Triglycerides 86.0 0.0 - 149.0 mg/dL   HDL 64.30 >39.00 mg/dL   VLDL 17.2 0.0 - 40.0 mg/dL   LDL Cholesterol 92 0 - 99 mg/dL   Total CHOL/HDL Ratio 3    NonHDL 109.67       Assessment & Plan:   Problem List Items Addressed This Visit    Schizoaffective disorder ([place])   Osteoporosis    Chronic, continue calcium, vit D, and fosamax.       Lesion of oral mucosa    From history, seems to be present over the last year, slowly growing. Affects denture fit. Will refer to oral surgeon for eval.       Relevant Orders   Ambulatory referral  to ENT   Health maintenance examination - Primary    Preventative protocols reviewed and updated unless pt declined. Discussed healthy diet and lifestyle.       Habitual alcohol use    Pt states he's cut down on wine, drinking occasional beer. Will need to complete AUDIT-C next visit.      Bipolar 1 disorder (HCC)    Stable period on current regimen of olanzapine, prozac and depakote ER 750mg  daily, also using hydroxyzine for anxiety as needed. Followed by psych - last seen Monday.       Benign prostatic hyperplasia    Stable exam. Continue flomax.       Advanced care planning/counseling discussion    Advanced directive: brings packet. Scanned into chart 02/2017. Mother Adonis Huguenin then sister Eustaquio Maize are HCPOA.           No orders of the defined types were placed in this encounter.  Orders Placed This Encounter  Procedures  . Ambulatory referral to ENT    Referral Priority:   Routine    Referral Type:   Consultation    Referral Reason:   Specialty Services Required    Requested Specialty:   Otolaryngology    Number of Visits Requested:   1    Follow up plan: Return in about 1 year (around 03/22/2019) for annual exam, prior fasting for blood work, medicare wellness visit.  [name], MD

## 2018-03-21 NOTE — Assessment & Plan Note (Signed)
Pt states he's cut down on wine, drinking occasional beer. Will need to complete AUDIT-C next visit.

## 2018-03-21 NOTE — Patient Instructions (Addendum)
If interested, check with pharmacy about new 2 shot shingles series (shingrix).  Continue fosamax weekly for bone strength and tamsulosin daily for urination. We will refer you to oral surgeon for evaluation of growth at base of left gums.  Continue current medicines. Return as needed or in 1 year for next physical and wellness visit.   Health Maintenance, Male A healthy lifestyle and preventive care is important for your health and wellness. Ask your health care provider about what schedule of regular examinations is right for you. What should I know about weight and diet? Eat a Healthy Diet  Eat plenty of vegetables, fruits, whole grains, low-fat dairy products, and lean protein.  Do not eat a lot of foods high in solid fats, added sugars, or salt.  Maintain a Healthy Weight Regular exercise can help you achieve or maintain a healthy weight. You should:  Do at least 150 minutes of exercise each week. The exercise should increase your heart rate and make you sweat (moderate-intensity exercise).  Do strength-training exercises at least twice a week.  Watch Your Levels of Cholesterol and Blood Lipids  Have your blood tested for lipids and cholesterol every 5 years starting at 66 years of age. If you are at high risk for heart disease, you should start having your blood tested when you are 66 years old. You may need to have your cholesterol levels checked more often if: ? Your lipid or cholesterol levels are high. ? You are older than 66 years of age. ? You are at high risk for heart disease.  What should I know about cancer screening? Many types of cancers can be detected early and may often be prevented. Lung Cancer  You should be screened every year for lung cancer if: ? You are a current smoker who has smoked for at least 30 years. ? You are a former smoker who has quit within the past 15 years.  Talk to your health care provider about your screening options, when you should  start screening, and how often you should be screened.  Colorectal Cancer  Routine colorectal cancer screening usually begins at 66 years of age and should be repeated every 5-10 years until you are 66 years old. You may need to be screened more often if early forms of precancerous polyps or small growths are found. Your health care provider may recommend screening at an earlier age if you have risk factors for colon cancer.  Your health care provider may recommend using home test kits to check for hidden blood in the stool.  A small camera at the end of a tube can be used to examine your colon (sigmoidoscopy or colonoscopy). This checks for the earliest forms of colorectal cancer.  Prostate and Testicular Cancer  Depending on your age and overall health, your health care provider may do certain tests to screen for prostate and testicular cancer.  Talk to your health care provider about any symptoms or concerns you have about testicular or prostate cancer.  Skin Cancer  Check your skin from head to toe regularly.  Tell your health care provider about any new moles or changes in moles, especially if: ? There is a change in a mole's size, shape, or color. ? You have a mole that is larger than a pencil eraser.  Always use sunscreen. Apply sunscreen liberally and repeat throughout the day.  Protect yourself by wearing long sleeves, pants, a wide-brimmed hat, and sunglasses when outside.  What should I know  about heart disease, diabetes, and high blood pressure?  If you are 68-59 years of age, have your blood pressure checked every 3-5 years. If you are 19 years of age or older, have your blood pressure checked every year. You should have your blood pressure measured twice-once when you are at a hospital or clinic, and once when you are not at a hospital or clinic. Record the average of the two measurements. To check your blood pressure when you are not at a hospital or clinic, you can  use: ? An automated blood pressure machine at a pharmacy. ? A home blood pressure monitor.  Talk to your health care provider about your target blood pressure.  If you are between 73-55 years old, ask your health care provider if you should take aspirin to prevent heart disease.  Have regular diabetes screenings by checking your fasting blood sugar level. ? If you are at a normal weight and have a low risk for diabetes, have this test once every three years after the age of 37. ? If you are overweight and have a high risk for diabetes, consider being tested at a younger age or more often.  A one-time screening for abdominal aortic aneurysm (AAA) by ultrasound is recommended for men aged 78-75 years who are current or former smokers. What should I know about preventing infection? Hepatitis B If you have a higher risk for hepatitis B, you should be screened for this virus. Talk with your health care provider to find out if you are at risk for hepatitis B infection. Hepatitis C Blood testing is recommended for:  Everyone born from [birth year] through 1964.  Anyone with known risk factors for hepatitis C.  Sexually Transmitted Diseases (STDs)  You should be screened each year for STDs including gonorrhea and chlamydia if: ? You are sexually active and are younger than 66 years of age. ? You are older than 66 years of age and your health care provider tells you that you are at risk for this type of infection. ? Your sexual activity has changed since you were last screened and you are at an increased risk for chlamydia or gonorrhea. Ask your health care provider if you are at risk.  Talk with your health care provider about whether you are at high risk of being infected with HIV. Your health care provider may recommend a prescription medicine to help prevent HIV infection.  What else can I do?  Schedule regular health, dental, and eye exams.  Stay current with your vaccines  (immunizations).  Do not use any tobacco products, such as cigarettes, chewing tobacco, and e-cigarettes. If you need help quitting, ask your health care provider.  Limit alcohol intake to no more than 2 drinks per day. One drink equals 12 ounces of beer, 5 ounces of wine, or 1 ounces of hard liquor.  Do not use street drugs.  Do not share needles.  Ask your health care provider for help if you need support or information about quitting drugs.  Tell your health care provider if you often feel depressed.  Tell your health care provider if you have ever been abused or do not feel safe at home. This information is not intended to replace advice given to you by your health care provider. Make sure you discuss any questions you have with your health care provider. Document Released: 03/10/2008 Document Revised: 05/11/2016 Document Reviewed: 06/16/2015 Elsevier Interactive Patient Education  [employer].

## 2018-03-21 NOTE — Assessment & Plan Note (Addendum)
Stable period on current regimen of olanzapine, prozac and depakote ER 750mg  daily, also using hydroxyzine for anxiety as needed. Followed by psych - last seen Monday.

## 2018-03-21 NOTE — Assessment & Plan Note (Signed)
Advanced directive: brings packet. Scanned into chart 02/2017. Mother Adonis Huguenin then sister Eustaquio Maize are HCPOA.

## 2018-03-21 NOTE — Assessment & Plan Note (Signed)
Preventative protocols reviewed and updated unless pt declined. Discussed healthy diet and lifestyle.  

## 2018-03-22 ENCOUNTER — Other Ambulatory Visit: Payer: Medicare HMO

## 2018-03-26 DIAGNOSIS — Z125 Encounter for screening for malignant neoplasm of prostate: Secondary | ICD-10-CM | POA: Insufficient documentation

## 2018-03-26 NOTE — Progress Notes (Signed)
Creating an addendum to correct dx code for PSA.

## 2018-03-27 ENCOUNTER — Ambulatory Visit: Payer: Medicare HMO

## 2018-04-03 DIAGNOSIS — D3709 Neoplasm of uncertain behavior of other specified sites of the oral cavity: Secondary | ICD-10-CM | POA: Diagnosis not present

## 2018-04-03 DIAGNOSIS — K116 Mucocele of salivary gland: Secondary | ICD-10-CM | POA: Diagnosis not present

## 2018-04-05 ENCOUNTER — Other Ambulatory Visit: Payer: Self-pay

## 2018-04-05 ENCOUNTER — Encounter: Payer: Self-pay | Admitting: *Deleted

## 2018-04-11 NOTE — Discharge Instructions (Signed)
General Anesthesia, Adult, Care After °These instructions provide you with information about caring for yourself after your procedure. Your health care provider may also give you more specific instructions. Your treatment has been planned according to current medical practices, but problems sometimes occur. Call your health care provider if you have any problems or questions after your procedure. °What can I expect after the procedure? °After the procedure, it is common to have: °· Vomiting. °· A sore throat. °· Mental slowness. ° °It is common to feel: °· Nauseous. °· Cold or shivery. °· Sleepy. °· Tired. °· Sore or achy, even in parts of your body where you did not have surgery. ° °Follow these instructions at home: °For at least 24 hours after the procedure: °· Do not: °? Participate in activities where you could fall or become injured. °? Drive. °? Use heavy machinery. °? Drink alcohol. °? Take sleeping pills or medicines that cause drowsiness. °? Make important decisions or sign legal documents. °? Take care of children on your own. °· Rest. °Eating and drinking °· If you vomit, drink water, juice, or soup when you can drink without vomiting. °· Drink enough fluid to keep your urine clear or pale yellow. °· Make sure you have little or no nausea before eating solid foods. °· Follow the diet recommended by your health care provider. °General instructions °· Have a responsible adult stay with you until you are awake and alert. °· Return to your normal activities as told by your health care provider. Ask your health care provider what activities are safe for you. °· Take over-the-counter and prescription medicines only as told by your health care provider. °· If you smoke, do not smoke without supervision. °· Keep all follow-up visits as told by your health care provider. This is important. °Contact a health care provider if: °· You continue to have nausea or vomiting at home, and medicines are not helpful. °· You  cannot drink fluids or start eating again. °· You cannot urinate after 8-12 hours. °· You develop a skin rash. °· You have fever. °· You have increasing redness at the site of your procedure. °Get help right away if: °· You have difficulty breathing. °· You have chest pain. °· You have unexpected bleeding. °· You feel that you are having a life-threatening or urgent problem. °This information is not intended to replace advice given to you by your health care provider. Make sure you discuss any questions you have with your health care provider. °Document Released: 12/19/2000 Document Revised: 02/15/2016 Document Reviewed: 08/27/2015 °Elsevier Interactive Patient Education © 2018 Else[employer] Inc. ° °

## 2018-04-13 ENCOUNTER — Ambulatory Visit: Payer: Medicare HMO | Admitting: Anesthesiology

## 2018-04-13 ENCOUNTER — Ambulatory Visit
Admission: RE | Admit: 2018-04-13 | Discharge: 2018-04-13 | Disposition: A | Discharge status: Home / Self Care | Payer: Medicare HMO | Source: Ambulatory Visit | Attending: Unknown Physician Specialty | Admitting: Unknown Physician Specialty

## 2018-04-13 ENCOUNTER — Encounter
Admission: RE | Disposition: A | Discharge status: Home / Self Care | Payer: Self-pay | Source: Ambulatory Visit | Attending: Unknown Physician Specialty

## 2018-04-13 DIAGNOSIS — Z79899 Other long term (current) drug therapy: Secondary | ICD-10-CM | POA: Insufficient documentation

## 2018-04-13 DIAGNOSIS — F319 Bipolar disorder, unspecified: Secondary | ICD-10-CM | POA: Insufficient documentation

## 2018-04-13 DIAGNOSIS — D3701 Neoplasm of uncertain behavior of lip: Secondary | ICD-10-CM | POA: Diagnosis not present

## 2018-04-13 DIAGNOSIS — F209 Schizophrenia, unspecified: Secondary | ICD-10-CM | POA: Insufficient documentation

## 2018-04-13 DIAGNOSIS — D3709 Neoplasm of uncertain behavior of other specified sites of the oral cavity: Secondary | ICD-10-CM | POA: Diagnosis not present

## 2018-04-13 DIAGNOSIS — K1370 Unspecified lesions of oral mucosa: Secondary | ICD-10-CM | POA: Insufficient documentation

## 2018-04-13 DIAGNOSIS — D489 Neoplasm of uncertain behavior, unspecified: Secondary | ICD-10-CM | POA: Diagnosis not present

## 2018-04-13 DIAGNOSIS — Z87891 Personal history of nicotine dependence: Secondary | ICD-10-CM | POA: Diagnosis not present

## 2018-04-13 DIAGNOSIS — K136 Irritative hyperplasia of oral mucosa: Secondary | ICD-10-CM | POA: Insufficient documentation

## 2018-04-13 DIAGNOSIS — K062 Gingival and edentulous alveolar ridge lesions associated with trauma: Secondary | ICD-10-CM | POA: Diagnosis not present

## 2018-04-13 DIAGNOSIS — D1039 Benign neoplasm of other parts of mouth: Secondary | ICD-10-CM | POA: Diagnosis not present

## 2018-04-13 HISTORY — DX: Presence of dental prosthetic device (complete) (partial): Z97.2

## 2018-04-13 HISTORY — PX: MASS EXCISION: SHX2000

## 2018-04-13 SURGERY — EXCISION MASS
Anesthesia: General | Site: Mouth | Wound class: Clean Contaminated

## 2018-04-13 MED ORDER — LIDOCAINE HCL (CARDIAC) PF 100 MG/5ML IV SOSY
PREFILLED_SYRINGE | INTRAVENOUS | Status: DC | PRN
  Administered 2018-04-13: 40 mg via INTRAVENOUS

## 2018-04-13 MED ORDER — LACTATED RINGERS IV SOLN
INTRAVENOUS | Status: DC
  Administered 2018-04-13: 08:00:00 via INTRAVENOUS

## 2018-04-13 MED ORDER — ONDANSETRON HCL 4 MG/2ML IJ SOLN: 4.0000 mg | Freq: Once | INTRAMUSCULAR | Status: DC | PRN

## 2018-04-13 MED ORDER — SUCCINYLCHOLINE CHLORIDE 20 MG/ML IJ SOLN
INTRAMUSCULAR | Status: DC | PRN
  Administered 2018-04-13: 100 mg via INTRAVENOUS

## 2018-04-13 MED ORDER — GLYCOPYRROLATE 0.2 MG/ML IJ SOLN
INTRAMUSCULAR | Status: DC | PRN
  Administered 2018-04-13: 0.1 mg via INTRAVENOUS

## 2018-04-13 MED ORDER — FENTANYL CITRATE (PF) 100 MCG/2ML IJ SOLN
INTRAMUSCULAR | Status: DC | PRN
  Administered 2018-04-13: 50 ug via INTRAVENOUS

## 2018-04-13 MED ORDER — FENTANYL CITRATE (PF) 100 MCG/2ML IJ SOLN: 25.0000 ug | INTRAMUSCULAR | Status: DC | PRN

## 2018-04-13 MED ORDER — DEXAMETHASONE SODIUM PHOSPHATE 4 MG/ML IJ SOLN
INTRAMUSCULAR | Status: DC | PRN
  Administered 2018-04-13: 10 mg via INTRAVENOUS

## 2018-04-13 MED ORDER — PROPOFOL 10 MG/ML IV BOLUS
INTRAVENOUS | Status: DC | PRN
  Administered 2018-04-13: 40 mg via INTRAVENOUS
  Administered 2018-04-13: 30 mg via INTRAVENOUS
  Administered 2018-04-13: 130 mg via INTRAVENOUS
  Administered 2018-04-13: 50 mg via INTRAVENOUS

## 2018-04-13 MED ORDER — LIDOCAINE-EPINEPHRINE 1 %-1:100000 IJ SOLN
INTRAMUSCULAR | Status: DC | PRN
  Administered 2018-04-13: 4 mL

## 2018-04-13 MED ORDER — LACTATED RINGERS IV SOLN: 1000.0000 mL | INTRAVENOUS | Status: DC

## 2018-04-13 MED ORDER — MIDAZOLAM HCL 5 MG/5ML IJ SOLN
INTRAMUSCULAR | Status: DC | PRN
  Administered 2018-04-13: 1 mg via INTRAVENOUS

## 2018-04-13 MED ORDER — ONDANSETRON HCL 4 MG/2ML IJ SOLN
INTRAMUSCULAR | Status: DC | PRN
  Administered 2018-04-13: 4 mg via INTRAVENOUS

## 2018-04-13 SURGICAL SUPPLY — 15 items
CORD BIP STRL DISP 12FT (MISCELLANEOUS) ×2 IMPLANT
DRAPE HEAD BAR (DRAPES) ×2 IMPLANT
ELECT REM PT RETURN 9FT ADLT (ELECTROSURGICAL) ×3
ELECTRODE REM PT RTRN 9FT ADLT (ELECTROSURGICAL) ×1 IMPLANT
GLOVE BIO SURGEON STRL SZ7.5 (GLOVE) ×6 IMPLANT
KIT TURNOVER KIT A (KITS) ×3 IMPLANT
NDL HYPO 25GX1X1/2 BEV (NEEDLE) ×1 IMPLANT
NEEDLE HYPO 25GX1X1/2 BEV (NEEDLE) ×3 IMPLANT
NS IRRIG 500ML POUR BTL (IV SOLUTION) ×3 IMPLANT
PACK DRAPE NASAL/ENT (PACKS) ×3 IMPLANT
SOL PREP PVP 2OZ (MISCELLANEOUS) ×3
SOLUTION PREP PVP 2OZ (MISCELLANEOUS) IMPLANT
SUT VIC AB 4-0 RB1 18 (SUTURE) ×2 IMPLANT
SYR 10ML LL (SYRINGE) ×3 IMPLANT
TOWEL OR 17X26 4PK STRL BLUE (TOWEL DISPOSABLE) ×3 IMPLANT

## 2018-04-13 NOTE — H&P (Signed)
The patient's history has been reviewed, patient examined, no change in status, stable for surgery.  Questions were answered to the patients satisfaction.  

## 2018-04-13 NOTE — Op Note (Signed)
04/13/2018  10:05 AM    F[facility] Pho  [id number]   Pre-Op Dx: ORAL MASS NEOPLASM UNCERTAIN BEHAVIOR  Post-op Dx: SAME  Proc: Excision left lower gingivobuccal lesion approximately 1 x 2.5 centimeters  Surg:  Roena Malady  Anes:  GOT  EBL: Less than 5 cc  Comp: None  Findings: 1 x 2.5 cm mass left lower gingivobuccal region  Procedure: Kylie was identified in the holding area take the operating room placed in supine position.  After general endotracheal anesthesia the table was turned 90 degrees.  Patient was draped in usual fashion for oral surgery.  Examination of the oral cavity showed a 1 x 2.5 cm mass in the left anterior gingivobuccal region.  Local anesthetic of 1% lidocaine with 1 100,000 units of epinephrine was used to inject around the lesion approximately 3 cc was used.  Short sharp scissors were then used to excise the lesion in its entirety.  Hemostasis was achieved using the bipolar cautery.  4-0 Vicryl was then used to reapproximate the edges to close the wound.  The patient was then returned to anesthesia where he was awakened in the operating room taken recovery room in stable condition.  Specimen: Left lower gingivobuccal lesion  Dispo:   Good  Plan: Discharged home on a soft diet follow-up 2 weeks  Roena Malady  04/13/2018 10:05 AM

## 2018-04-13 NOTE — Anesthesia Postprocedure Evaluation (Signed)
Anesthesia Post Note  Patient: Robert Fowler  Procedure(s) Performed: EXCISION LOWER LIP ORAL  MASS (N/A Mouth)  Patient location during evaluation: PACU Anesthesia Type: General Level of consciousness: awake and alert and oriented Pain management: satisfactory to patient Vital Signs Assessment: post-procedure vital signs reviewed and stable Respiratory status: spontaneous breathing, nonlabored ventilation and respiratory function stable Cardiovascular status: blood pressure returned to baseline and stable Postop Assessment: Adequate PO intake and No signs of nausea or vomiting Anesthetic complications: no    Raliegh Ip

## 2018-04-13 NOTE — Transfer of Care (Addendum)
Immediate Anesthesia Transfer of Care Note  Patient: Robert Fowler  Procedure(s) Performed: EXCISION LOWER LIP ORAL  MASS (N/A Mouth)  Patient Location: PACU  Anesthesia Type: General  Level of Consciousness: awake, alert  and patient cooperative  Airway and Oxygen Therapy: Patient Spontanous Breathing and Patient connected to supplemental oxygen  Post-op Assessment: Post-op Vital signs reviewed, Patient's Cardiovascular Status Stable, Respiratory Function Stable, Patent Airway and No signs of Nausea or vomiting  Post-op Vital Signs: Reviewed and stable  Complications: No apparent anesthesia complications

## 2018-04-13 NOTE — Anesthesia Procedure Notes (Signed)
Procedure Name: Intubation Date/Time: 04/13/2018 9:45 AM Performed by: Mayme Genta, CRNA Pre-anesthesia Checklist: Patient identified, Emergency Drugs available, Suction available, Patient being monitored and Timeout performed Patient Re-evaluated:Patient Re-evaluated prior to induction Oxygen Delivery Method: Circle system utilized Preoxygenation: Pre-oxygenation with 100% oxygen Induction Type: IV induction Ventilation: Mask ventilation without difficulty Laryngoscope Size: Miller and 3 Grade View: Grade I Tube type: Oral Tube size: 7.5 mm Number of attempts: 1 Placement Confirmation: ETT inserted through vocal cords under direct vision,  positive ETCO2 and breath sounds checked- equal and bilateral Secured at: 28 cm Tube secured with: Tape Dental Injury: Teeth and Oropharynx as per pre-operative assessment  Comments: Attempted Nasal intubation through R nare. ETT inserted through cords without difficulty. Leak noted after inflation of cuff. Unable to maintain seal. Reintubated with oral ETT without difficulty. Tolerated well by pt.

## 2018-04-13 NOTE — Anesthesia Preprocedure Evaluation (Signed)
Anesthesia Evaluation  Patient identified by MRN, date of birth, ID band Patient awake    Reviewed: Allergy & Precautions, H&P , NPO status , Patient's Chart, lab work & pertinent test results  Airway Mallampati: II  TM Distance: >3 FB Neck ROM: full    Dental  (+) Edentulous Lower, Edentulous Upper   Pulmonary former smoker,    Pulmonary exam normal breath sounds clear to auscultation       Cardiovascular Normal cardiovascular exam Rhythm:regular Rate:Normal     Neuro/Psych PSYCHIATRIC DISORDERS Bipolar Disorder Schizophrenia    GI/Hepatic (+)     substance abuse  alcohol use,   Endo/Other    Renal/GU      Musculoskeletal   Abdominal   Peds  Hematology   Anesthesia Other Findings   Reproductive/Obstetrics                             Anesthesia Physical Anesthesia Plan  ASA: II  Anesthesia Plan: General   Post-op Pain Management:    Induction:   PONV Risk Score and Plan: 2 and Treatment may vary due to age or medical condition, Ondansetron and Dexamethasone  Airway Management Planned:   Additional Equipment:   Intra-op Plan:   Post-operative Plan:   Informed Consent: I have reviewed the patients History and Physical, chart, labs and discussed the procedure including the risks, benefits and alternatives for the proposed anesthesia with the patient or authorized representative who has indicated his/her understanding and acceptance.     Plan Discussed with: CRNA  Anesthesia Plan Comments:         Anesthesia Quick Evaluation

## 2018-04-16 ENCOUNTER — Encounter: Payer: Self-pay | Admitting: Unknown Physician Specialty

## 2018-04-17 LAB — SURGICAL PATHOLOGY

## 2018-05-21 DIAGNOSIS — D239 Other benign neoplasm of skin, unspecified: Secondary | ICD-10-CM | POA: Diagnosis not present

## 2018-06-30 IMAGING — DX DG ANKLE COMPLETE 3+V*L*
3 series · 3 of 3 positions shown · non-contrast
Comparison: None.

CLINICAL DATA: Left medial ankle pain and swelling.

EXAM:
LEFT ANKLE COMPLETE - 3+ VIEW

[ankle ap]
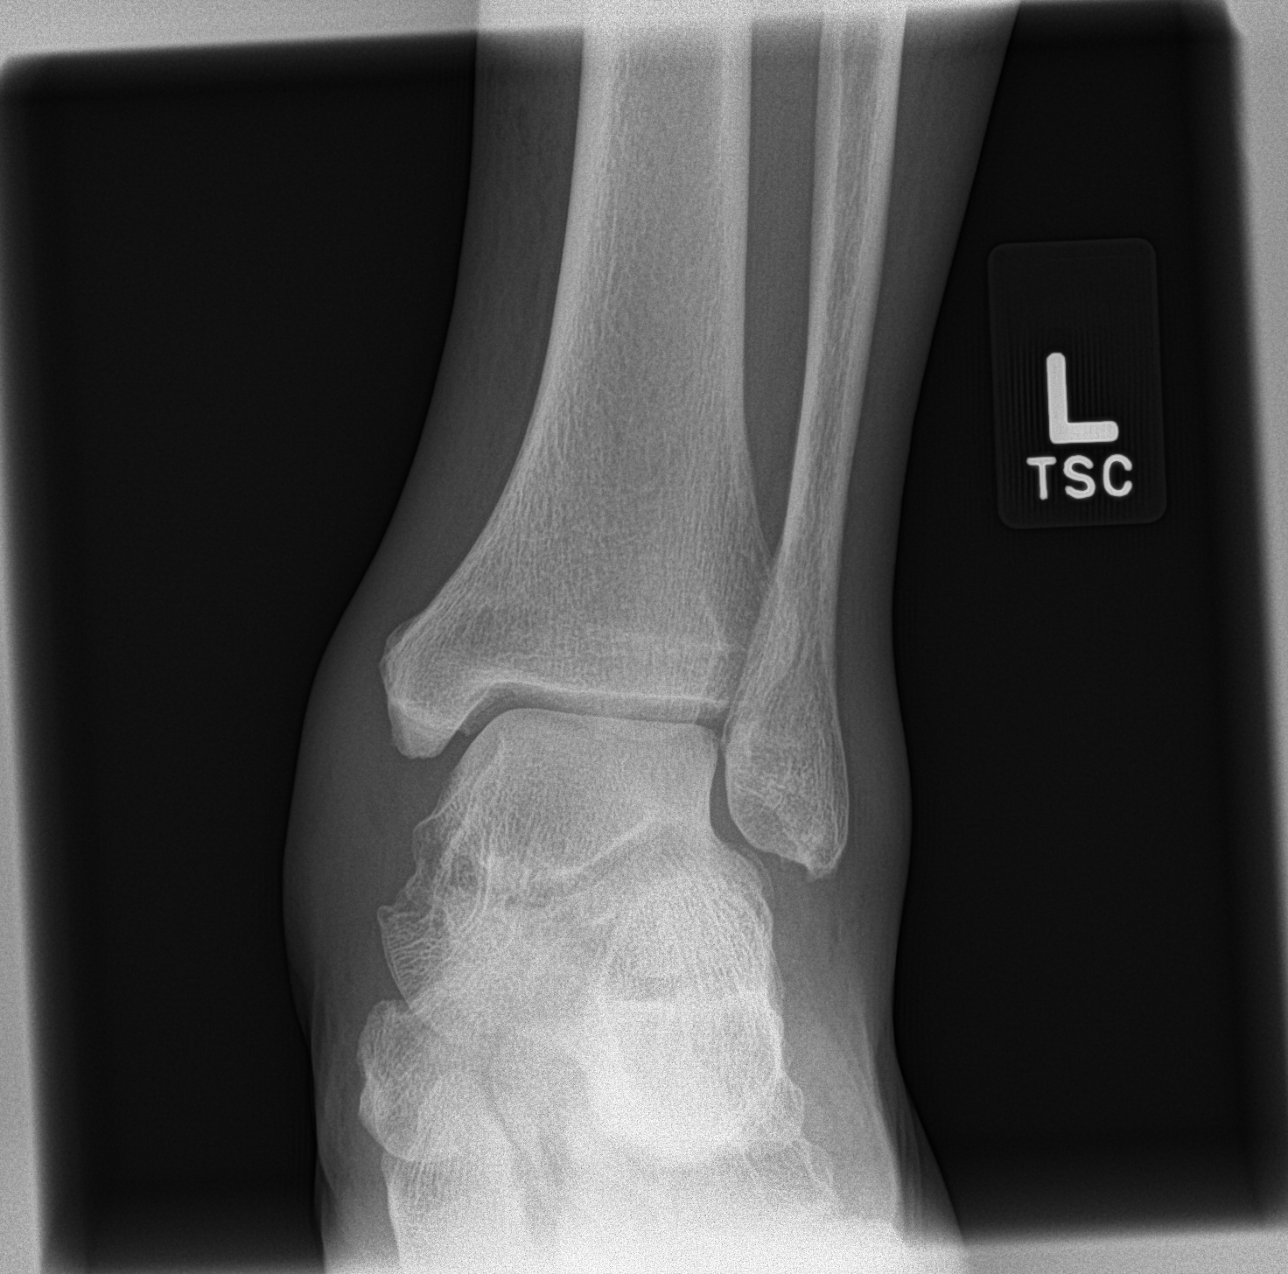

[ankle obl]
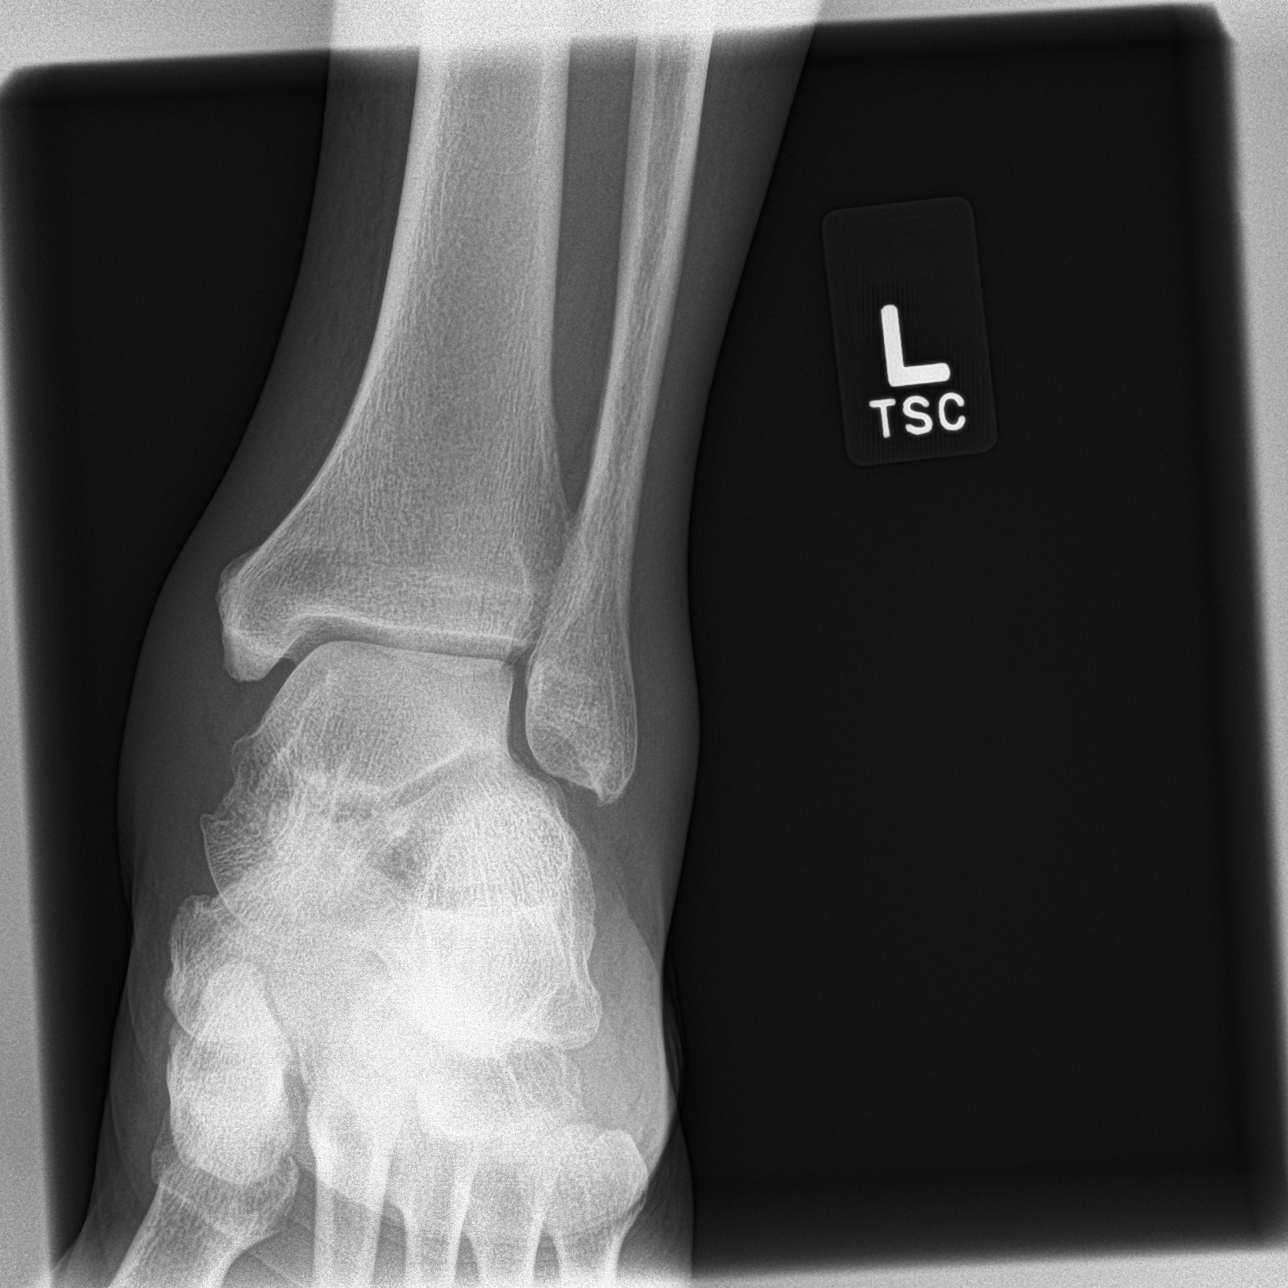

[ankle lat]
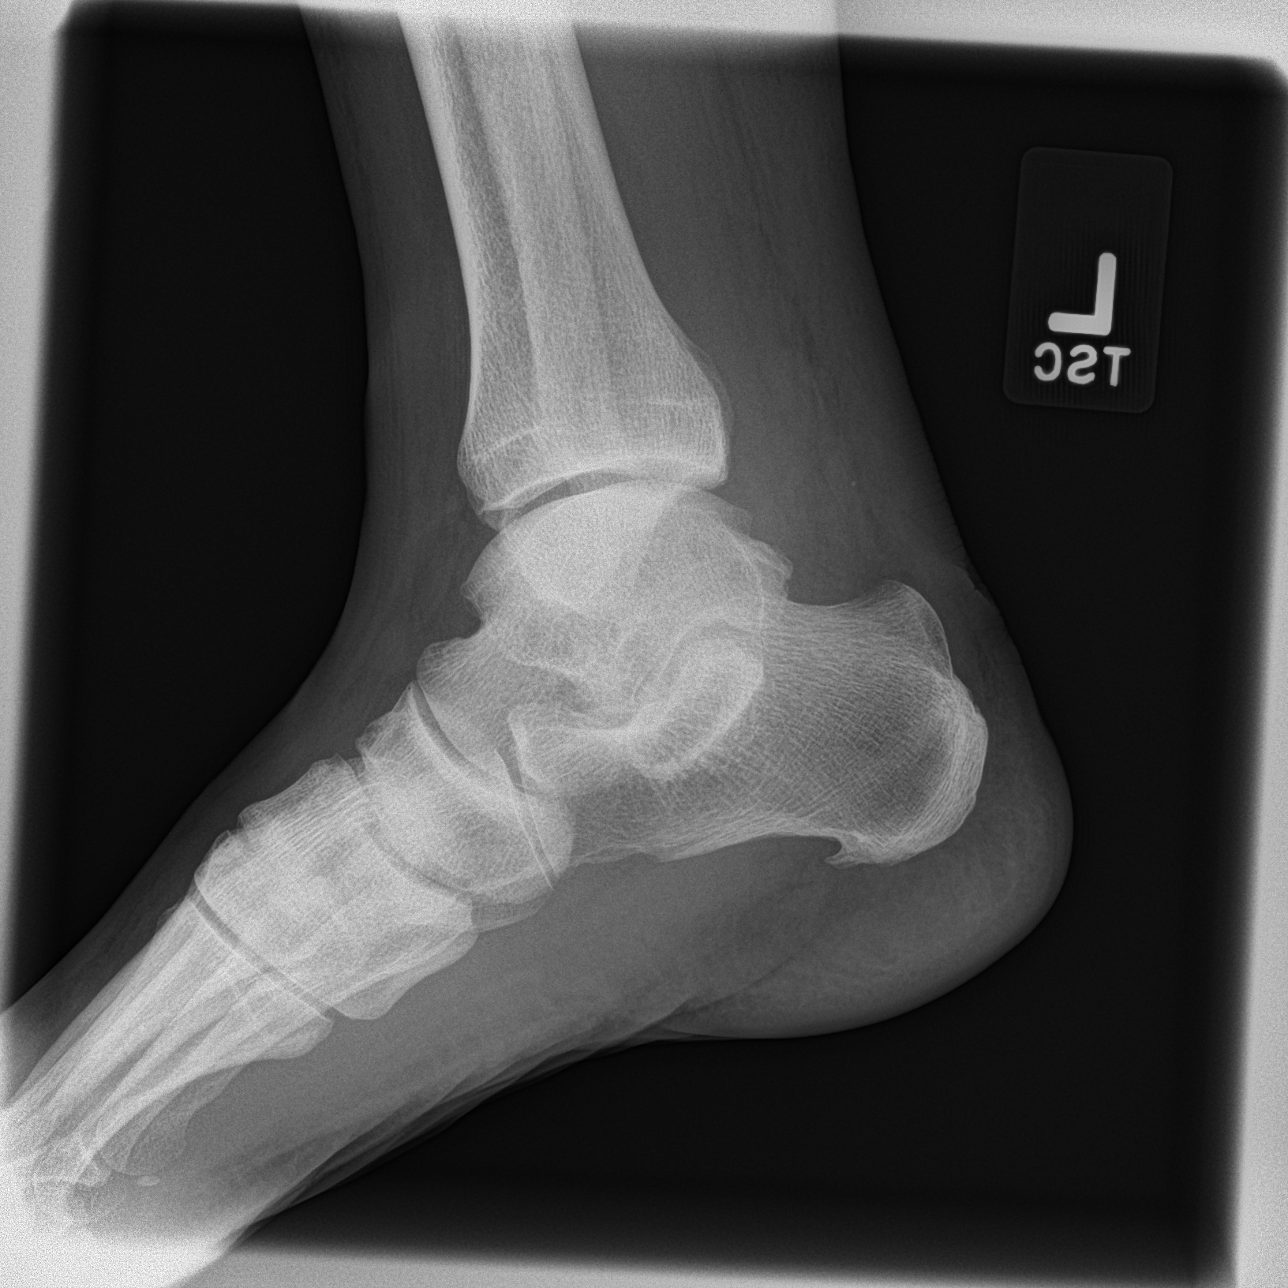

[3 of 3 positions shown; findings below may reference images not displayed]

FINDINGS: There is no evidence of fracture, dislocation, or joint effusion.
The talar dome is intact. The ankle mortise is symmetric. There is
no evidence of arthropathy or other focal bone abnormality. Plantar
enthesophyte. Moderate medial and mild lateral malleolar soft tissue
swelling.
IMPRESSION: Soft tissue swelling about the ankle, greater over the medial
malleolus. No fracture.

## 2018-07-09 DIAGNOSIS — H2513 Age-related nuclear cataract, bilateral: Secondary | ICD-10-CM | POA: Diagnosis not present

## 2018-07-10 DIAGNOSIS — F250 Schizoaffective disorder, bipolar type: Secondary | ICD-10-CM | POA: Diagnosis not present

## 2018-07-19 IMAGING — US US EXTREM LOW VENOUS*L*
1 series · 13 of 24 positions shown · non-contrast
Comparison: None.

CLINICAL DATA: Pain and swelling of the left lower leg for 5 days.



[Series 1: us extrem low venous*left* · 0.08mm/px · 13 of 37 slices shown]
[im 1/37]
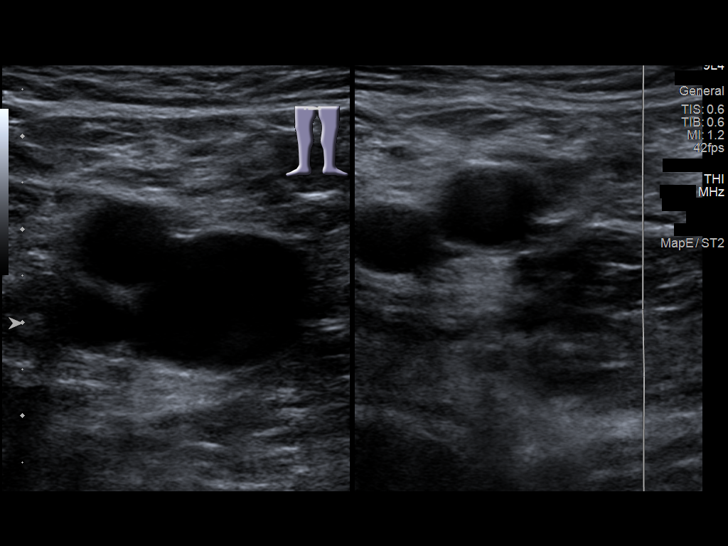
[im 4/37]
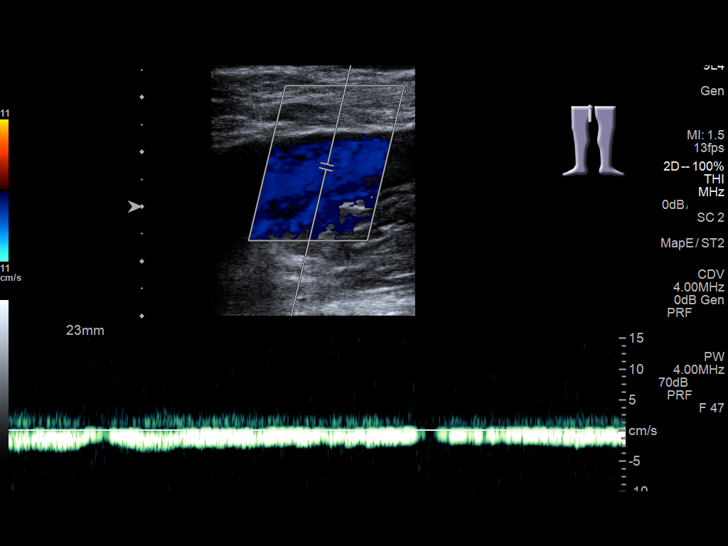
[im 7/37]
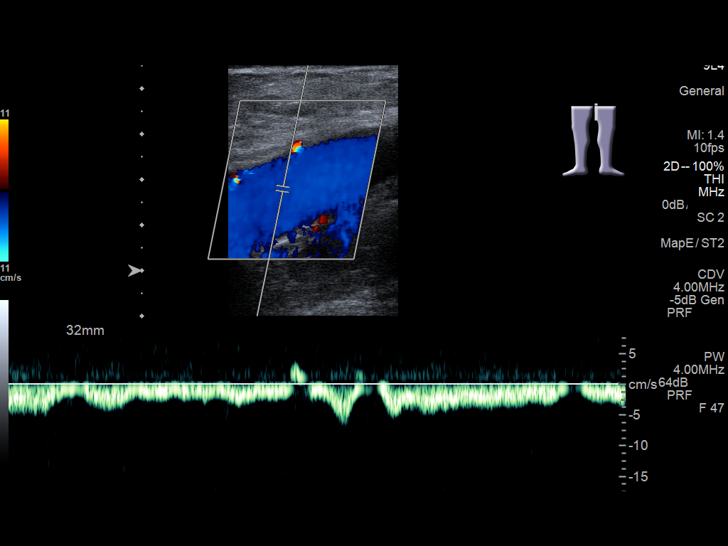
[im 10/37]
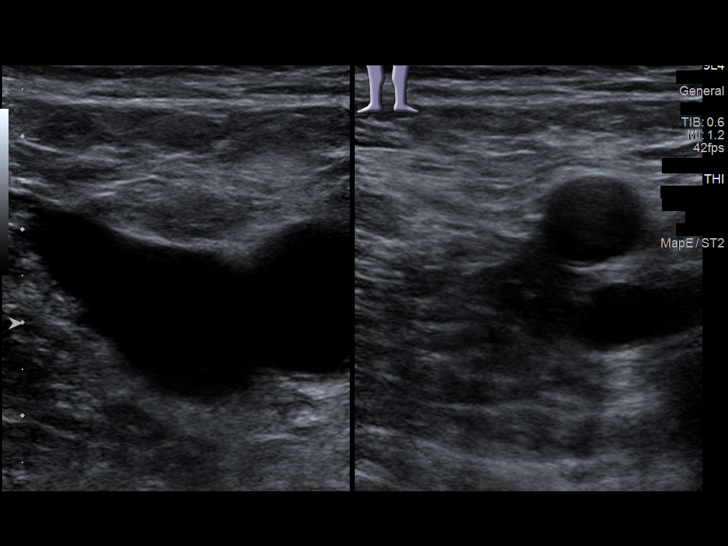
[im 13/37]
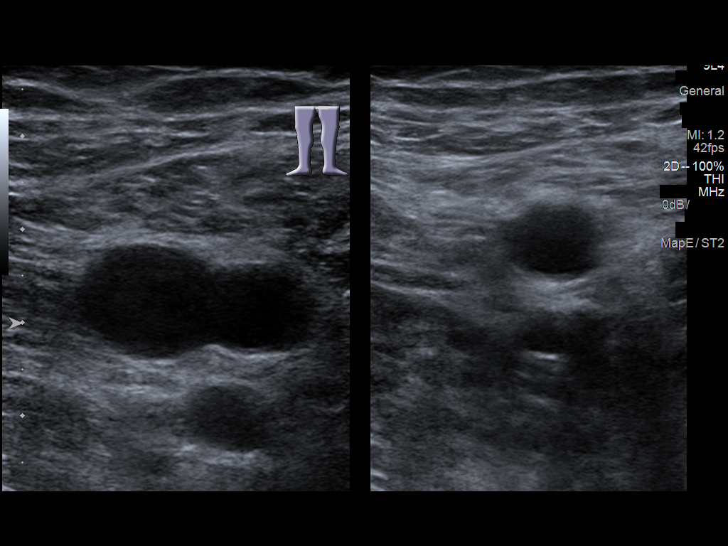
[im 16/37]
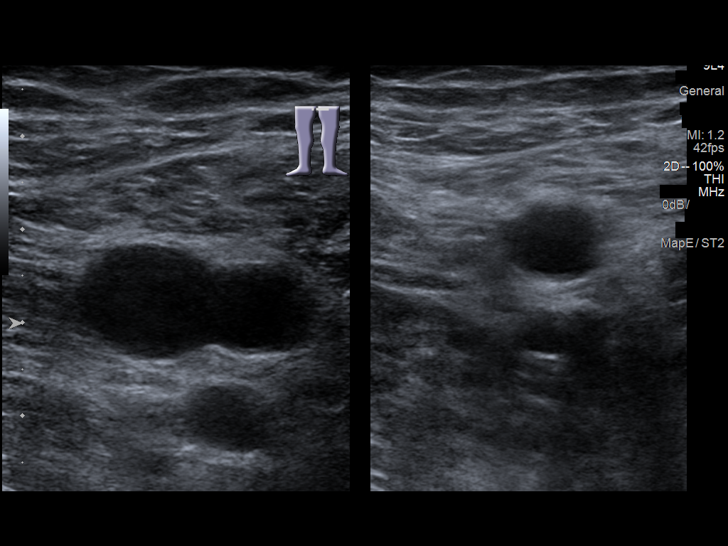
[im 19/37]
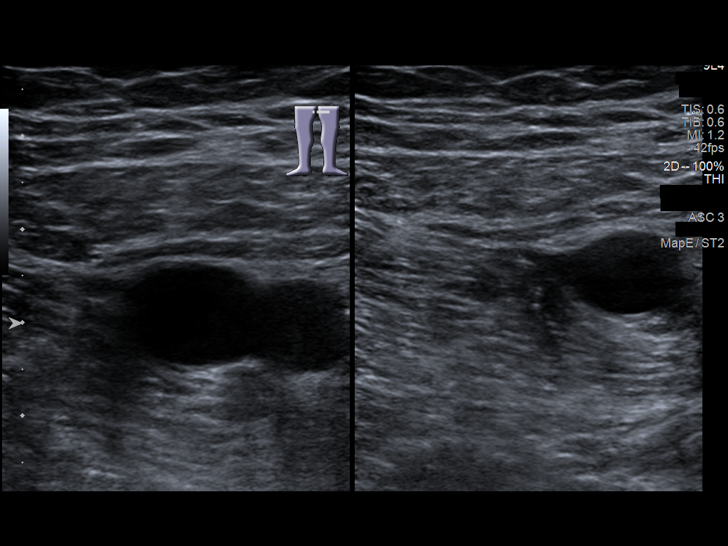
[im 21/37]
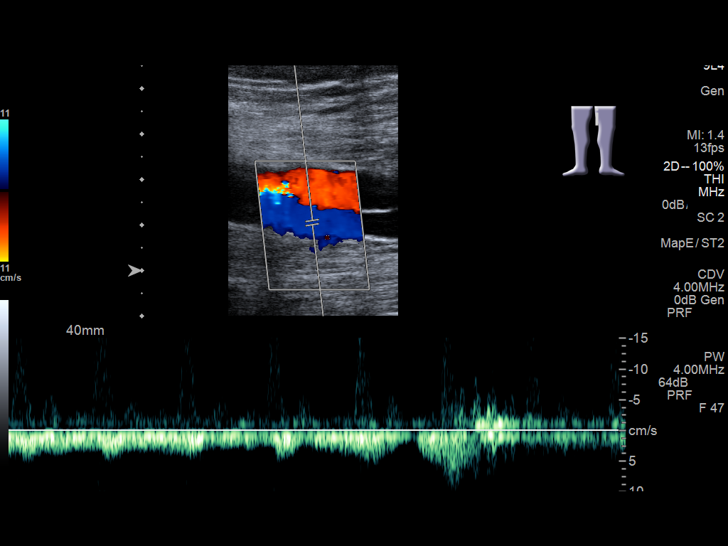
[im 24/37]
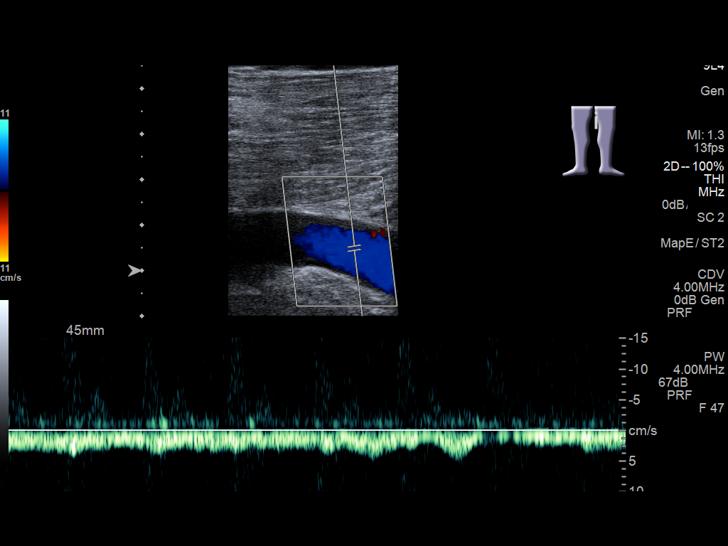
[im 27/37]
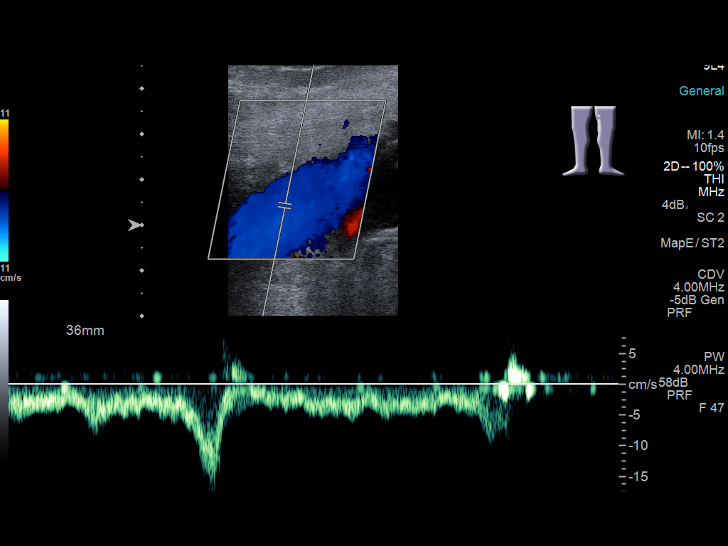
[im 30/37]
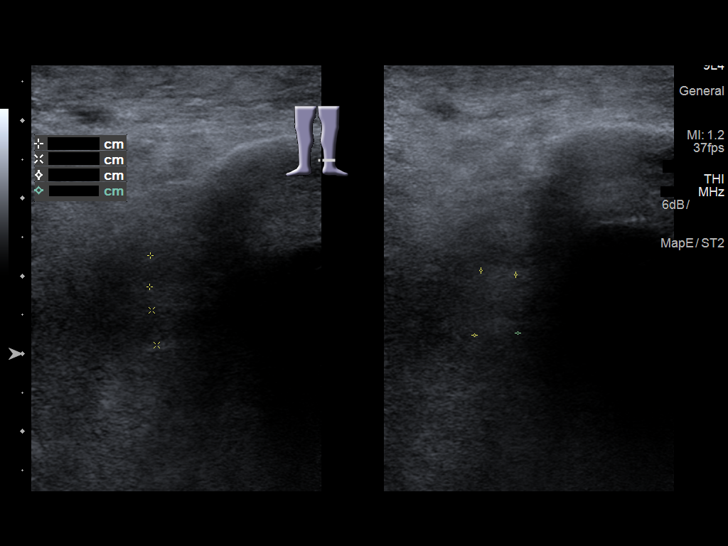
[im 33/37]
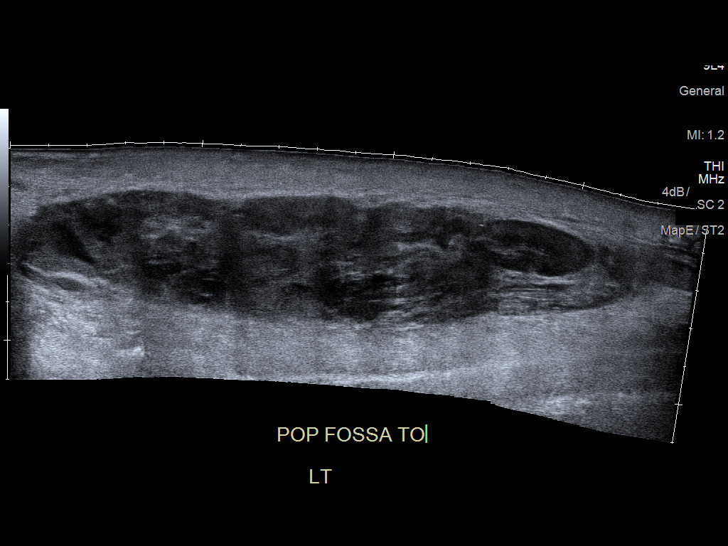
[im 37/37]
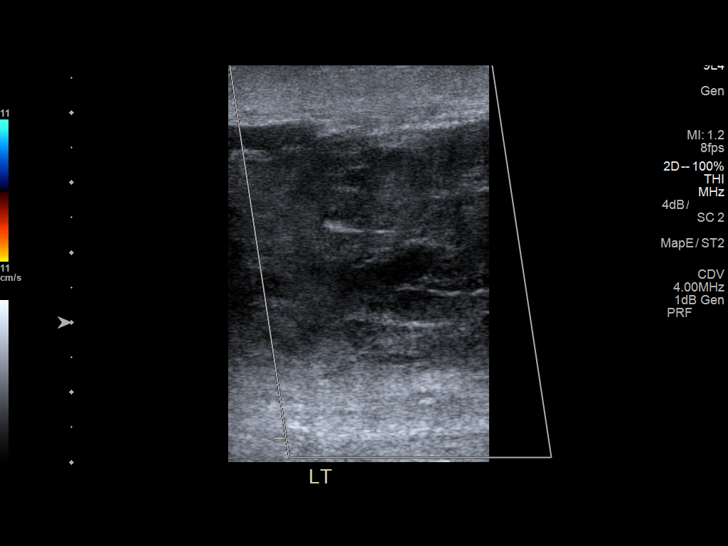

[13 of 24 positions shown; findings below may reference images not displayed]

FINDINGS: Contralateral Common Femoral Vein: Respiratory phasicity is normal
and symmetric with the symptomatic side. No evidence of thrombus.
Normal compressibility.

Common Femoral Vein: No evidence of thrombus. Normal
compressibility, respiratory phasicity and response to augmentation.

Saphenofemoral Junction: No evidence of thrombus. Normal
compressibility and flow on color Doppler imaging.

Profunda Femoral Vein: No evidence of thrombus. Normal
compressibility and flow on color Doppler imaging.

Femoral Vein: No evidence of thrombus. Normal compressibility,
respiratory phasicity and response to augmentation.

Popliteal Vein: No evidence of thrombus. Normal compressibility,
respiratory phasicity and response to augmentation.

Calf Veins: No evidence of thrombus. Normal compressibility and flow
on color Doppler imaging.

Superficial Great Saphenous Vein: No evidence of thrombus. Normal
compressibility and flow on color Doppler imaging.

Venous Reflux:  None.

Other Findings: There is a heterogeneous fluid collection extends
from the popliteal fossa into the mid calf measuring 17 x 3.4 x
cm.
IMPRESSION: No evidence of DVT within the left lower extremity.

Heterogeneous fluid collection extending from the left popliteal
fossa into the mid calf. A ruptured popliteal cyst with hemorrhage
is suspected.

## 2018-12-24 ENCOUNTER — Other Ambulatory Visit: Payer: Self-pay | Admitting: Family Medicine

## 2019-02-19 ENCOUNTER — Other Ambulatory Visit: Payer: Self-pay | Admitting: Family Medicine

## 2019-03-14 ENCOUNTER — Other Ambulatory Visit: Payer: Self-pay | Admitting: Family Medicine

## 2019-03-14 DIAGNOSIS — M818 Other osteoporosis without current pathological fracture: Secondary | ICD-10-CM

## 2019-03-14 DIAGNOSIS — N400 Enlarged prostate without lower urinary tract symptoms: Secondary | ICD-10-CM

## 2019-03-14 DIAGNOSIS — F319 Bipolar disorder, unspecified: Secondary | ICD-10-CM

## 2019-03-15 ENCOUNTER — Other Ambulatory Visit (INDEPENDENT_AMBULATORY_CARE_PROVIDER_SITE_OTHER): Payer: Medicare Other

## 2019-03-15 DIAGNOSIS — F319 Bipolar disorder, unspecified: Secondary | ICD-10-CM | POA: Diagnosis not present

## 2019-03-15 DIAGNOSIS — N400 Enlarged prostate without lower urinary tract symptoms: Secondary | ICD-10-CM | POA: Diagnosis not present

## 2019-03-15 DIAGNOSIS — M818 Other osteoporosis without current pathological fracture: Secondary | ICD-10-CM

## 2019-03-15 LAB — CBC WITH DIFFERENTIAL/PLATELET
Basophils Absolute: 0 10*3/uL (ref 0.0–0.1)
Basophils Relative: 0.7 % (ref 0.0–3.0)
Eosinophils Absolute: 0.1 10*3/uL (ref 0.0–0.7)
Eosinophils Relative: 2.8 % (ref 0.0–5.0)
HCT: 41.5 % (ref 39.0–52.0)
Hemoglobin: 14.4 g/dL (ref 13.0–17.0)
Lymphocytes Relative: 26.9 % (ref 12.0–46.0)
Lymphs Abs: 1.1 10*3/uL (ref 0.7–4.0)
MCHC: 34.7 g/dL (ref 30.0–36.0)
MCV: 87.2 fl (ref 78.0–100.0)
Monocytes Absolute: 0.4 10*3/uL (ref 0.1–1.0)
Monocytes Relative: 10.4 % (ref 3.0–12.0)
Neutro Abs: 2.5 10*3/uL (ref 1.4–7.7)
Neutrophils Relative %: 59.2 % (ref 43.0–77.0)
Platelets: 191 10*3/uL (ref 150.0–400.0)
RBC: 4.76 Mil/uL (ref 4.22–5.81)
RDW: 14.3 % (ref 11.5–15.5)
WBC: 4.1 10*3/uL (ref 4.0–10.5)

## 2019-03-15 LAB — VITAMIN D 25 HYDROXY (VIT D DEFICIENCY, FRACTURES): VITD: 48.83 ng/mL (ref 30.00–100.00)

## 2019-03-15 LAB — TSH: TSH: 1.29 u[IU]/mL (ref 0.35–4.50)

## 2019-03-15 LAB — COMPREHENSIVE METABOLIC PANEL
ALT: 12 U/L (ref 0–53)
AST: 6 U/L (ref 0–37)
Albumin: 4.3 g/dL (ref 3.5–5.2)
Alkaline Phosphatase: 45 U/L (ref 39–117)
BUN: 7 mg/dL (ref 6–23)
CO2: 26 mEq/L (ref 19–32)
Calcium: 9.3 mg/dL (ref 8.4–10.5)
Chloride: 102 mEq/L (ref 96–112)
Creatinine, Ser: 0.73 mg/dL (ref 0.40–1.50)
GFR: 107.16 mL/min (ref >60.00)
Glucose, Bld: 93 mg/dL (ref 70–99)
Potassium: 4.3 mEq/L (ref 3.5–5.1)
Sodium: 138 mEq/L (ref 135–145)
Total Bilirubin: 0.9 mg/dL (ref 0.2–1.2)
Total Protein: 6.3 g/dL (ref 6.0–8.3)

## 2019-03-15 LAB — PSA: PSA: 1.17 ng/mL (ref 0.10–4.00)

## 2019-03-18 ENCOUNTER — Other Ambulatory Visit: Payer: Self-pay

## 2019-03-18 ENCOUNTER — Ambulatory Visit (INDEPENDENT_AMBULATORY_CARE_PROVIDER_SITE_OTHER): Payer: Medicare Other

## 2019-03-18 DIAGNOSIS — Z0000 Encounter for general adult medical examination without abnormal findings: Secondary | ICD-10-CM | POA: Diagnosis not present

## 2019-03-18 NOTE — Patient Instructions (Signed)
Robert Fowler , Thank you for taking time to come for your Medicare Wellness Visit. I appreciate your ongoing commitment to your health goals. Please review the following plan we discussed and let me know if I can assist you in the future.   These are the goals we discussed: Goals    . Patient Stated     Starting 03/18/19, I will continue to take medications as prescribed.        This is a list of the screening recommended for you and due dates:  Health Maintenance  Topic Date Due  . Stool Blood Test  03/26/2019*  . Colon Cancer Screening  03/26/2019*  . Flu Shot  04/27/2019  . DTaP/Tdap/Td vaccine (2 - Td) 10/30/2023  . Tetanus Vaccine  10/30/2023  .  Hepatitis C: One time screening is recommended by Center for Disease Control  (CDC) for  adults born from [birth year] through 1964.   Completed  . Pneumonia vaccines  Completed  *Topic was postponed. The date shown is not the original due date.   Preventive Care for Adults  A healthy lifestyle and preventive care can promote health and wellness. Preventive health guidelines for adults include the following key practices.  . A routine yearly physical is a good way to check with your health care provider about your health and preventive screening. It is a chance to share any concerns and updates on your health and to receive a thorough exam.  . Visit your dentist for a routine exam and preventive care every 6 months. Brush your teeth twice a day and floss once a day. Good oral hygiene prevents tooth decay and gum disease.  . The frequency of eye exams is based on your age, health, family medical history, use  of contact lenses, and other factors. Follow your health care provider's recommendations for frequency of eye exams.  . Eat a healthy diet. Foods like vegetables, fruits, whole grains, low-fat dairy products, and lean protein foods contain the nutrients you need without too many calories. Decrease your intake of foods high in solid fats,  added sugars, and salt. Eat the right amount of calories for you. Get information about a proper diet from your health care provider, if necessary.  . Regular physical exercise is one of the most important things you can do for your health. Most adults should get at least 150 minutes of moderate-intensity exercise (any activity that increases your heart rate and causes you to sweat) each week. In addition, most adults need muscle-strengthening exercises on 2 or more days a week.  Silver Sneakers may be a benefit available to you. To determine eligibility, you may visit the website: www.silversneakers.com or contact program at 603-836-0333 Mon-Fri between 8AM-8PM.   . Maintain a healthy weight. The body mass index (BMI) is a screening tool to identify possible weight problems. It provides an estimate of body fat based on height and weight. Your health care provider can find your BMI and can help you achieve or maintain a healthy weight.   For adults 20 years and older: ? A BMI below 18.5 is considered underweight. ? A BMI of 18.5 to 24.9 is normal. ? A BMI of 25 to 29.9 is considered overweight. ? A BMI of 30 and above is considered obese.   . Maintain normal blood lipids and cholesterol levels by exercising and minimizing your intake of saturated fat. Eat a balanced diet with plenty of fruit and vegetables. Blood tests for lipids and cholesterol should begin  at age 21 and be repeated every 5 years. If your lipid or cholesterol levels are high, you are over 50, or you are at high risk for heart disease, you may need your cholesterol levels checked more frequently. Ongoing high lipid and cholesterol levels should be treated with medicines if diet and exercise are not working.  . If you smoke, find out from your health care provider how to quit. If you do not use tobacco, please do not start.  . If you choose to drink alcohol, please do not consume more than 2 drinks per day. One drink is  considered to be 12 ounces (355 mL) of beer, 5 ounces (148 mL) of wine, or 1.5 ounces (44 mL) of liquor.  . If you are 78-55 years old, ask your health care provider if you should take aspirin to prevent strokes.  . Use sunscreen. Apply sunscreen liberally and repeatedly throughout the day. You should seek shade when your shadow is shorter than you. Protect yourself by wearing long sleeves, pants, a wide-brimmed hat, and sunglasses year round, whenever you are outdoors.  . Once a month, do a whole body skin exam, using a mirror to look at the skin on your back. Tell your health care provider of new moles, moles that have irregular borders, moles that are larger than a pencil eraser, or moles that have changed in shape or color.

## 2019-03-18 NOTE — Progress Notes (Signed)
Subjective:   Robert Fowler is a 68 y.o. male who presents for Medicare Annual/Subsequent preventive examination.  Review of Systems:  N/A Cardiac Risk Factors include: advanced age (>26men, >49 women);male gender     Objective:    Vitals: There were no vitals taken for this visit.  There is no height or weight on file to calculate BMI.  Advanced Directives 03/18/2019 04/13/2018 03/14/2018 04/11/2017 03/08/2017  Does Patient Have a Medical Advance Directive? Yes Yes Yes No Yes  Type of Paramedic of [place];Living will [place];Living will [place];Living will - [place];Living will  Does patient want to make changes to medical advance directive? No - Patient declined No - Patient declined - - -  Copy of [place] in Chart? No - copy requested No - copy requested No - copy requested - No - copy requested  Would patient like information on creating a medical advance directive? - - - No - Patient declined -  Some encounter information is confidential and restricted. Go to Review Flowsheets activity to see all data.    Tobacco Social History   Tobacco Use  Smoking Status Former Smoker  . Quit date: 09/26/2006  . Years since quitting: 12.4  Smokeless Tobacco Never Used     Counseling given: No   Clinical Intake:  Pre-visit preparation completed: Yes  Pain : No/denies pain Pain Score: 0-No pain     Nutritional Status: BMI 25 -29 Overweight Nutritional Risks: None Diabetes: No  How often do you need to have someone help you when you read instructions, pamphlets, or other written materials from your doctor or pharmacy?: 1 - Never What is the last grade level you completed in school?: 12th grade  Interpreter Needed?: No  Comments: pt lives independently Information entered by :: LPinson, LPN  Past Medical History:  Diagnosis Date  . Arthritis   . BENIGN PROSTATIC  HYPERTROPHY 07/18/2007  . Bipolar disorder ([place])   . Habitual alcohol use   . OSTEOPOROSIS 09/04/2007   DEXA -2.8 lumbar (03/2015)  . Pes planus   . Prediabetes 2014  . Schizoaffective disorder ([place])    h/o paranoid schizophrenia  . Wears dentures    full upper and lower   Past Surgical History:  Procedure Laterality Date  . INGUINAL HERNIA REPAIR Right 08/2014   Dr Gershon Crane  . MASS EXCISION N/A 04/13/2018   Procedure: EXCISION LOWER LIP ORAL  MASS;  Surgeon: Beverly Gust, MD;  Location: [facility];  Service: ENT;  Laterality: N/A;   Family History  Problem Relation Age of Onset  . Alcohol abuse Father   . Alcohol abuse Maternal Grandmother   . COPD Mother        smoker  . CAD Neg Hx   . Stroke Neg Hx   . Cancer Neg Hx   . Diabetes Neg Hx   . Hypertension Neg Hx    Social History   Socioeconomic History  . Marital status: Single    Spouse name: Not on file  . Number of children: Not on file  . Years of education: Not on file  . Highest education level: Not on file  Occupational History  . Not on file  Social Needs  . Financial resource strain: Not on file  . Food insecurity    Worry: Not on file    Inability: Not on file  . Transportation needs    Medical: Not on file  Non-medical: Not on file  Tobacco Use  . Smoking status: Former Smoker    Quit date: 09/26/2006    Years since quitting: 12.4  . Smokeless tobacco: Never Used  Substance and Sexual Activity  . Alcohol use: Yes    Alcohol/week: 4.0 standard drinks    Types: 4 Glasses of wine per week  . Drug use: Yes    Types: Marijuana  . Sexual activity: Not Currently  Lifestyle  . Physical activity    Days per week: Not on file    Minutes per session: Not on file  . Stress: Not on file  Relationships  . Social Herbalist on phone: Not on file    Gets together: Not on file    Attends religious service: Not on file    Active member of club or organization: Not on file    Attends  meetings of clubs or organizations: Not on file    Relationship status: Not on file  Other Topics Concern  . Not on file  Social History Narrative   Lives alone.     Mother and brother live nearby.   Occupation: Retail buyer at [employer]   Edu: 12th grade   Activity: mows yard   Diet: good water, fruits/vegetables daily      Disability evaluation - no disability (04/2014)    Outpatient Encounter Medications as of 03/18/2019  Medication Sig  . acetaminophen (TYLENOL) 500 MG tablet Take 1,000 mg by mouth every 6 (six) hours as needed for moderate pain or headache.  . alendronate (FOSAMAX) 70 MG tablet TAKE 1 TABLET BY MOUTH EVERY 7 DAYS WITH A FULL GLASS OF WATER ON AN EMPTY STOMACH  . aspirin 81 MG tablet Take 81 mg by mouth daily.  . benztropine (COGENTIN) 0.5 MG tablet Take 0.5 mg by mouth at bedtime.  Marland Kitchen CALCIUM CITRATE PO Take 1 tablet by mouth daily.  . Cholecalciferol (VITAMIN D) 2000 units CAPS Take 1 capsule by mouth daily.  . divalproex (DEPAKOTE ER) 250 MG 24 hr tablet   . FLUoxetine (PROZAC) 20 MG capsule   . hydrOXYzine (ATARAX/VISTARIL) 25 MG tablet As needed  . Melatonin 5 MG TABS Take 1-5 mg by mouth at bedtime as needed.   . Multiple Vitamin (MULTIVITAMIN) tablet Take 1 tablet by mouth daily.  Marland Kitchen neomycin-polymyxin-pramoxine (NEOSPORIN PLUS) 1 % cream Apply 1 application topically 2 (two) times daily as needed (minor cuts/wounds).  . OLANZapine (ZYPREXA) 10 MG tablet Take 1 tablet (10 mg total) by mouth at bedtime.  Marland Kitchen OLANZapine zydis (ZYPREXA) 5 MG disintegrating tablet Take 1 tablet (5 mg total) by mouth daily.  . Omega-3 Fatty Acids (FISH OIL PO) Take 1,200 mg by mouth daily.  . tamsulosin (FLOMAX) 0.4 MG CAPS capsule TAKE 1 CAPSULE BY MOUTH EVERY DAY   No facility-administered encounter medications on file as of 03/18/2019.     Activities of Daily Living In your present state of health, do you have any difficulty performing the following activities: 03/18/2019  04/13/2018  Hearing? N N  Vision? N N  Difficulty concentrating or making decisions? Y N  Walking or climbing stairs? N N  Dressing or bathing? N N  Doing errands, shopping? N -  Preparing Food and eating ? N -  Using the Toilet? N -  In the past six months, have you accidently leaked urine? N -  Do you have problems with loss of bowel control? N -  Managing your Medications? N -  Managing your Finances? N -  Housekeeping or managing your Housekeeping? N -  Some recent data might be hidden    Patient Care Team: [name], MD as PCP - General (Family Medicine)   Assessment:   This is a routine wellness examination for Brydan.   Hearing Screening   125Hz  250Hz  500Hz  1000Hz  2000Hz  3000Hz  4000Hz  6000Hz  8000Hz   Right ear:           Left ear:           Vision Screening Comments: Vision exam in Oct 2019 with Dr. Ellie Lunch   Exercise Activities and Dietary recommendations Current Exercise Habits: The patient does not participate in regular exercise at present, Exercise limited by: None identified  Goals    . Patient Stated     Starting 03/18/19, I will continue to take medications as prescribed.        Fall Risk Fall Risk  03/18/2019 03/14/2018 03/08/2017 03/07/2016 03/03/2015  Falls in the past year? 0 Yes Yes No Yes  Comment - per pt, multiple falls after behavioral health hospitalization pt states he was pulling weeds and fell backwards in yard; no injury, no medical treatment - -  Number falls in past yr: - 2 or more 1 - 2 or more  Injury with Fall? - No No - No  Risk for fall due to : - History of fall(s) Impaired balance/gait - (No Data)  Risk for fall due to: Comment - - - - Mis-steps sometimes   Depression Screen PHQ 2/9 Scores 03/18/2019 03/14/2018 03/13/2017 03/08/2017  PHQ - 2 Score 0 0 0 0  PHQ- 9 Score 0 0 2 -    Cognitive Function MMSE - Mini Mental State Exam 03/18/2019 03/14/2018 03/08/2017  Orientation to time 5 5 5   Orientation to Place 5 5 5   Registration 3 3  3   Attention/ Calculation 0 0 0  Recall 3 3 1   Recall-comments - - pt was unable to recall 2 of 3 words  Language- name 2 objects 0 0 0  Language- repeat 1 1 1   Language- follow 3 step command 0 3 3  Language- read & follow direction 0 0 0  Write a sentence 0 0 0  Copy design 0 0 0  Total score 17 20 18        PLEASE NOTE: A Mini-Cog screen was completed. Maximum score is 17. A value of 0 denotes this part of [place] MMSE was not completed or the patient failed this part of the Mini-Cog screening.   Mini-Cog Screening Orientation to Time - Max 5 pts Orientation to Place - Max 5 pts Registration - Max 3 pts Recall - Max 3 pts Language Repeat - Max 1 pts    Immunization History  Administered Date(s) Administered  . Influenza Whole 07/09/2008, 06/26/2013  . Influenza, High Dose Seasonal PF 06/09/2018  . Influenza,inj,Quad PF,6+ Mos 07/30/2014, 08/09/2016  . Influenza-Unspecified 05/22/2017  . Pneumococcal Conjugate-13 03/08/2017  . Pneumococcal Polysaccharide-23 03/14/2018  . Tdap 10/29/2013  . Zoster 07/30/2014    Screening Tests Health Maintenance  Topic Date Due  . COLON CANCER SCREENING ANNUAL FOBT  03/26/2019 (Originally 03/13/2018)  . COLONOSCOPY  03/26/2019 (Originally 03/02/2002)  . INFLUENZA VACCINE  04/27/2019  . DTaP/Tdap/Td (2 - Td) 10/30/2023  . TETANUS/TDAP  10/30/2023  . Hepatitis C Screening  Completed  . PNA vac Low Risk Adult  Completed       Plan:     I have personally reviewed, addressed, and noted  the following in the patient's chart:  A. Medical and social history B. Use of alcohol, tobacco or illicit drugs  C. Current medications and supplements D. Functional ability and status E.  Nutritional status F.  Physical activity G. Advance directives H. List of other physicians I.  Hospitalizations, surgeries, and ER visits in previous 12 months J.  Vitals (unless it is a telemedicine encounter) K. Screenings to include cognitive, depression,  hearing, vision (NOTE: hearing and vision screenings not completed in telemedicine encounter) L. Referrals and appointments   In addition, I have reviewed and discussed with patient certain preventive protocols, quality metrics, and best practice recommendations. A written personalized care plan for preventive services and recommendations were provided to patient.  With patient's permission, we connected on 03/18/19 at  2:30 PM EDT. Interactive audio and video telecommunications were attempted with patient. This attempt was unsuccessful due to patient having technical difficulties OR patient did not have access to video capability.  Encounter was completed with audio only.  Two patient identifiers were used to ensure the encounter occurred with the correct person. Patient was in home and writer was in office.     Signed,   Lindell Noe, MHA, BS, RN Health Coach

## 2019-03-18 NOTE — Progress Notes (Signed)
PCP notes:   Health maintenance:  Colon cancer screening - to be determined  Abnormal screenings:   None  Patient concerns:   None  Nurse concerns:  None  Next PCP appt:   04/17/19 @ 1515

## 2019-03-22 ENCOUNTER — Ambulatory Visit: Payer: Self-pay

## 2019-03-22 ENCOUNTER — Encounter: Payer: Self-pay | Admitting: Family Medicine

## 2019-03-25 NOTE — Progress Notes (Signed)
I reviewed health advisor's note, was available for consultation, and agree with documentation and plan.  

## 2019-03-27 DIAGNOSIS — F1020 Alcohol dependence, uncomplicated: Secondary | ICD-10-CM | POA: Diagnosis not present

## 2019-03-27 DIAGNOSIS — F250 Schizoaffective disorder, bipolar type: Secondary | ICD-10-CM | POA: Diagnosis not present

## 2019-04-17 ENCOUNTER — Ambulatory Visit (INDEPENDENT_AMBULATORY_CARE_PROVIDER_SITE_OTHER): Payer: Medicare Other | Admitting: Family Medicine

## 2019-04-17 ENCOUNTER — Encounter: Payer: Self-pay | Admitting: Family Medicine

## 2019-04-17 ENCOUNTER — Other Ambulatory Visit: Payer: Self-pay

## 2019-04-17 VITALS — BP 142/90 | HR 86 | Temp 99.3°F | Ht 70.0 in | Wt 193.1 lb

## 2019-04-17 DIAGNOSIS — Z0000 Encounter for general adult medical examination without abnormal findings: Secondary | ICD-10-CM | POA: Diagnosis not present

## 2019-04-17 DIAGNOSIS — R030 Elevated blood-pressure reading, without diagnosis of hypertension: Secondary | ICD-10-CM | POA: Insufficient documentation

## 2019-04-17 DIAGNOSIS — N400 Enlarged prostate without lower urinary tract symptoms: Secondary | ICD-10-CM

## 2019-04-17 DIAGNOSIS — I10 Essential (primary) hypertension: Secondary | ICD-10-CM | POA: Insufficient documentation

## 2019-04-17 DIAGNOSIS — F319 Bipolar disorder, unspecified: Secondary | ICD-10-CM

## 2019-04-17 DIAGNOSIS — M818 Other osteoporosis without current pathological fracture: Secondary | ICD-10-CM

## 2019-04-17 DIAGNOSIS — F250 Schizoaffective disorder, bipolar type: Secondary | ICD-10-CM

## 2019-04-17 DIAGNOSIS — Z1211 Encounter for screening for malignant neoplasm of colon: Secondary | ICD-10-CM

## 2019-04-17 NOTE — Patient Instructions (Addendum)
Ok to stop other vitamins, but continue vitamin D3. We will repeat bone density scan.  I do recommend you continue seeing Dr Josph Macho psychiatrist.  If interested, check with pharmacy about new 2 shot shingles series (shingrix).  Monitor blood pressures at home or local pharmacy, let me know if consistently >140/90 to discuss starting medicine. Limit salt/sodium in the diet, eat lots of fruits/vegetables, work on regular aerobic exercise to help keep blood pressure under control.   Health Maintenance After Age 50 After age 36, you are at a higher risk for certain long-term diseases and infections as well as injuries from falls. Falls are a major cause of broken bones and head injuries in people who are older than age 14. Getting regular preventive care can help to keep you healthy and well. Preventive care includes getting regular testing and making lifestyle changes as recommended by your health care provider. Talk with your health care provider about:  Which screenings and tests you should have. A screening is a test that checks for a disease when you have no symptoms.  A diet and exercise plan that is right for you. What should I know about screenings and tests to prevent falls? Screening and testing are the best ways to find a health problem early. Early diagnosis and treatment give you the best chance of managing medical conditions that are common after age 75. Certain conditions and lifestyle choices may make you more likely to have a fall. Your health care provider may recommend:  Regular vision checks. Poor vision and conditions such as cataracts can make you more likely to have a fall. If you wear glasses, make sure to get your prescription updated if your vision changes.  Medicine review. Work with your health care provider to regularly review all of the medicines you are taking, including over-the-counter medicines. Ask your health care provider about any side effects that may make you more  likely to have a fall. Tell your health care provider if any medicines that you take make you feel dizzy or sleepy.  Osteoporosis screening. Osteoporosis is a condition that causes the bones to get weaker. This can make the bones weak and cause them to break more easily.  Blood pressure screening. Blood pressure changes and medicines to control blood pressure can make you feel dizzy.  Strength and balance checks. Your health care provider may recommend certain tests to check your strength and balance while standing, walking, or changing positions.  Foot health exam. Foot pain and numbness, as well as not wearing proper footwear, can make you more likely to have a fall.  Depression screening. You may be more likely to have a fall if you have a fear of falling, feel emotionally low, or feel unable to do activities that you used to do.  Alcohol use screening. Using too much alcohol can affect your balance and may make you more likely to have a fall. What actions can I take to lower my risk of falls? General instructions  Talk with your health care provider about your risks for falling. Tell your health care provider if: ? You fall. Be sure to tell your health care provider about all falls, even ones that seem minor. ? You feel dizzy, sleepy, or off-balance.  Take over-the-counter and prescription medicines only as told by your health care provider. These include any supplements.  Eat a healthy diet and maintain a healthy weight. A healthy diet includes low-fat dairy products, low-fat (lean) meats, and fiber from whole  grains, beans, and lots of fruits and vegetables. Home safety  Remove any tripping hazards, such as rugs, cords, and clutter.  Install safety equipment such as grab bars in bathrooms and safety rails on stairs.  Keep rooms and walkways well-lit. Activity   Follow a regular exercise program to stay fit. This will help you maintain your balance. Ask your health care provider  what types of exercise are appropriate for you.  If you need a cane or walker, use it as recommended by your health care provider.  Wear supportive shoes that have nonskid soles. Lifestyle  Do not drink alcohol if your health care provider tells you not to drink.  If you drink alcohol, limit how much you have: ? 0-1 drink a day for women. ? 0-2 drinks a day for men.  Be aware of how much alcohol is in your drink. In the [place] one drink equals one typical bottle of beer (12 oz), one-half glass of wine (5 oz), or one shot of hard liquor (1 oz).  Do not use any products that contain nicotine or tobacco, such as cigarettes and e-cigarettes. If you need help quitting, ask your health care provider. Summary  Having a healthy lifestyle and getting preventive care can help to protect your health and wellness after age 47.  Screening and testing are the best way to find a health problem early and help you avoid having a fall. Early diagnosis and treatment give you the best chance for managing medical conditions that are more common for people who are older than age 50.  Falls are a major cause of broken bones and head injuries in people who are older than age 50. Take precautions to prevent a fall at home.  Work with your health care provider to learn what changes you can make to improve your health and wellness and to prevent falls. This information is not intended to replace advice given to you by your health care provider. Make sure you discuss any questions you have with your health care provider. Document Released: 07/26/2017 Document Revised: 01/03/2019 Document Reviewed: 07/26/2017 Elsevier Patient Education  2020 [employer].

## 2019-04-17 NOTE — Assessment & Plan Note (Signed)
Stable PSA. DRE not done today.

## 2019-04-17 NOTE — Assessment & Plan Note (Signed)
He has self decreased olanzapine, continues prozac and depakote. Advised I do recommend he continue to see psychiatrist, he will call and schedule f/u with Dr Josph Macho at [facility].

## 2019-04-17 NOTE — Assessment & Plan Note (Signed)
Preventative protocols reviewed and updated unless pt declined. Discussed healthy diet and lifestyle.  

## 2019-04-17 NOTE — Assessment & Plan Note (Signed)
He has stopped calcium and vit D- advised continue vit D. Continues fosamax - due for rpt bone density.

## 2019-04-17 NOTE — Assessment & Plan Note (Signed)
Reviewed low salt/sodium diet, high potassium diet, increased aerobic exercise. He will monitor bp and let us know if staying high to discuss medication.

## 2019-04-17 NOTE — Progress Notes (Signed)
This visit was conducted in person.  BP (!) 142/90 (BP Location: Right Arm, Patient Position: Sitting, Cuff Size: Normal)   Pulse 86   Temp 99.3 F (37.4 C) (Temporal)   Ht 5\' 10"  (1.778 m)   Wt 193 lb 1.6 oz (87.6 kg)   SpO2 97%   BMI 27.71 kg/m   On recheck 148/92 BP Readings from Last 3 Encounters:  04/17/19 (!) 142/90  04/13/18 (!) 135/94  03/21/18 120/82    CC: CPE Subjective:    Patient ID: Robert Fowler, male    DOB: 1951-03-13, 68 y.o.   MRN: [id number]  HPI: Robert Fowler is a 68 y.o. male presenting on 04/17/2019 for Annual Exam (Pt states that he stopped taking one of his medications d/t depression - Olanzapine)   Saw health advisor last month for medicare wellness visit. Note reviewed.    He saw Dr Josph Macho at [facility] - he stopped 5mg  olanzapine in the morning but continues 10mg  at night - notes ongoing depression. Continues depakote and fluoxetine 20mg .   Preventative: Colon cancer screening - discussed, would like iFOB - provided last week and returned today.  Prostate cancer screening - discussed, would like to continue screening. H/o BPH per chart. Nocturia x1, on flomax.  OsteoporosisDEXA -2.8 lumbar (03/2015) - fosamax started 03/2017. Advised to let us know if jaw or hip pain. He stopped all vitamins.  Flu shot yearly at pharmacy prevnar 02/2017, pneumovax 02/2018 Tdap 10/2013  zostavax - 07/2014 Shingrix - discussed- declines Advanced directive:brings packet. Scanned into chart 02/2017. MotherVivianthen sisterBethare HCPOA. Seat belt use discussed. Sunscreen use discussed.No changing moles on skin. 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 Interim medical history since our last visit reviewed. Allergies and medications reviewed and updated. Outpatient Medications Prior to Visit  Medication Sig Dispense Refill  . acetaminophen (TYLENOL) 500 MG tablet Take 1,000 mg by mouth every 6 (six) hours as needed for moderate pain or headache.    . alendronate (FOSAMAX) 70 MG tablet TAKE 1 TABLET BY MOUTH EVERY 7 DAYS WITH A FULL GLASS OF WATER ON AN EMPTY STOMACH 12 tablet 3  . aspirin 81 MG tablet Take 81 mg by mouth daily.    . benztropine (COGENTIN) 0.5 MG tablet Take 0.5 mg by mouth at bedtime.    . Cholecalciferol (VITAMIN D) 2000 units CAPS Take 1 capsule by mouth daily.    . divalproex (DEPAKOTE ER) 250 MG 24 hr tablet     . FLUoxetine (PROZAC) 20 MG capsule     . hydrOXYzine (ATARAX/VISTARIL) 25 MG tablet As needed    . Melatonin 5 MG TABS Take 1-5 mg by mouth at bedtime as needed.     . neomycin-polymyxin-pramoxine (NEOSPORIN PLUS) 1 % cream Apply 1 application topically 2 (two) times daily as needed (minor cuts/wounds).    . tamsulosin (FLOMAX) 0.4 MG CAPS capsule TAKE 1 CAPSULE BY MOUTH EVERY DAY 90 capsule 1  . CALCIUM CITRATE  PO Take 1 tablet by mouth daily.    . Multiple Vitamin (MULTIVITAMIN) tablet Take 1 tablet by mouth daily.    . Omega-3 Fatty Acids (FISH OIL PO) Take 1,200 mg by mouth daily.    Marland Kitchen OLANZapine (ZYPREXA) 10 MG tablet Take 1 tablet (10 mg total) by mouth at bedtime.    Marland Kitchen OLANZapine zydis (ZYPREXA) 5 MG disintegrating tablet Take 1 tablet (5 mg total) by mouth daily.     No facility-administered medications prior to visit.      Per HPI unless specifically indicated in ROS section below Review of Systems  Constitutional: Positive for appetite change. Negative for activity change, chills, fatigue,  fever and unexpected weight change.  HENT: Negative for hearing loss.   Eyes: Negative for visual disturbance.  Respiratory: Positive for shortness of breath. Negative for cough, chest tightness and wheezing.   Cardiovascular: Negative for chest pain, palpitations and leg swelling.  Gastrointestinal: Positive for diarrhea (occasional). Negative for abdominal distention, abdominal pain, blood in stool, constipation, nausea and vomiting.  Genitourinary: Negative for difficulty urinating and hematuria.  Musculoskeletal: Negative for arthralgias, myalgias and neck pain.  Skin: Negative for rash.  Neurological: Negative for dizziness, seizures, syncope and headaches.  Hematological: Negative for adenopathy. Does not bruise/bleed easily.  Psychiatric/Behavioral: Positive for dysphoric mood. The patient is nervous/anxious.        Known bipolar   Objective:    BP (!) 142/90 (BP Location: Right Arm, Patient Position: Sitting, Cuff Size: Normal)   Pulse 86   Temp 99.3 F (37.4 C) (Temporal)   Ht 5\' 10"  (1.778 m)   Wt 193 lb 1.6 oz (87.6 kg)   SpO2 97%   BMI 27.71 kg/m   Wt Readings from Last 3 Encounters:  04/17/19 193 lb 1.6 oz (87.6 kg)  04/13/18 199 lb (90.3 kg)  03/21/18 201 lb 8 oz (91.4 kg)    Physical Exam Vitals signs and nursing note reviewed.  Constitutional:      General: He is not in acute distress.    Appearance: Normal appearance. He is well-developed. He is not ill-appearing.  HENT:     Head: Normocephalic and atraumatic.     Right Ear: Hearing, tympanic membrane, ear canal and external ear normal.     Left Ear: Hearing, tympanic membrane, ear canal and external ear normal.     Nose: Nose normal.     Mouth/Throat:     Mouth: Mucous membranes are moist.     Pharynx: Uvula midline. No oropharyngeal exudate or posterior oropharyngeal erythema.  Eyes:     General: No scleral icterus.    Extraocular Movements: Extraocular movements intact.     Conjunctiva/sclera:  Conjunctivae normal.     Pupils: Pupils are equal, round, and reactive to light.  Neck:     Musculoskeletal: Normal range of motion and neck supple.  Cardiovascular:     Rate and Rhythm: Normal rate and regular rhythm.     Pulses: Normal pulses.          Radial pulses are 2+ on the right side and 2+ on the left side.     Heart sounds: Normal heart sounds. No murmur.  Pulmonary:     Effort: Pulmonary effort is normal. No respiratory distress.     Breath sounds: Normal breath sounds. No wheezing, rhonchi or rales.  Abdominal:     General: Bowel sounds are normal. There is no distension.     Palpations: Abdomen is soft. There is no mass.  Tenderness: There is no abdominal tenderness. There is no guarding or rebound.     Hernia: No hernia is present.  Musculoskeletal: Normal range of motion.  Lymphadenopathy:     Cervical: No cervical adenopathy.  Skin:    General: Skin is warm and dry.     Findings: No rash.  Neurological:     General: No focal deficit present.     Mental Status: He is alert and oriented to person, place, and time.     Comments: CN grossly intact, station and gait intact  Psychiatric:        Mood and Affect: Mood normal.        Behavior: Behavior normal.        Thought Content: Thought content normal.        Judgment: Judgment normal.       Results for orders placed or performed in visit on 03/15/19  PSA  Result Value Ref Range   PSA 1.17 0.10 - 4.00 ng/mL  TSH  Result Value Ref Range   TSH 1.29 0.35 - 4.50 uIU/mL  CBC with Differential/Platelet  Result Value Ref Range   WBC 4.1 4.0 - 10.5 K/uL   RBC 4.76 4.22 - 5.81 Mil/uL   Hemoglobin 14.4 13.0 - 17.0 g/dL   HCT 41.5 39.0 - 52.0 %   MCV 87.2 78.0 - 100.0 fl   MCHC 34.7 30.0 - 36.0 g/dL   RDW 14.3 11.5 - 15.5 %   Platelets 191.0 150.0 - 400.0 K/uL   Neutrophils Relative % 59.2 43.0 - 77.0 %   Lymphocytes Relative 26.9 12.0 - 46.0 %   Monocytes Relative 10.4 3.0 - 12.0 %   Eosinophils Relative  2.8 0.0 - 5.0 %   Basophils Relative 0.7 0.0 - 3.0 %   Neutro Abs 2.5 1.4 - 7.7 K/uL   Lymphs Abs 1.1 0.7 - 4.0 K/uL   Monocytes Absolute 0.4 0.1 - 1.0 K/uL   Eosinophils Absolute 0.1 0.0 - 0.7 K/uL   Basophils Absolute 0.0 0.0 - 0.1 K/uL  Comprehensive metabolic panel  Result Value Ref Range   Sodium 138 135 - 145 mEq/L   Potassium 4.3 3.5 - 5.1 mEq/L   Chloride 102 96 - 112 mEq/L   CO2 26 19 - 32 mEq/L   Glucose, Bld 93 70 - 99 mg/dL   BUN 7 6 - 23 mg/dL   Creatinine, Ser 0.73 0.40 - 1.50 mg/dL   Total Bilirubin 0.9 0.2 - 1.2 mg/dL   Alkaline Phosphatase 45 39 - 117 U/L   AST 6 0 - 37 U/L   ALT 12 0 - 53 U/L   Total Protein 6.3 6.0 - 8.3 g/dL   Albumin 4.3 3.5 - 5.2 g/dL   Calcium 9.3 8.4 - 10.5 mg/dL   GFR 107.16 >60.00 mL/min  VITAMIN D 25 Hydroxy (Vit-D Deficiency, Fractures)  Result Value Ref Range   VITD 48.83 30.00 - 100.00 ng/mL   Assessment & Plan:   Problem List Items Addressed This Visit    Schizoaffective disorder ([place])   Osteoporosis    He has stopped calcium and vit D- advised continue vit D. Continues fosamax - due for rpt bone density.       Relevant Orders   DG Bone Density   Health maintenance examination - Primary    Preventative protocols reviewed and updated unless pt declined. Discussed healthy diet and lifestyle.       Elevated blood pressure reading in office without diagnosis of hypertension  Reviewed low salt/sodium diet, high potassium diet, increased aerobic exercise. He will monitor bp and let us know if staying high to discuss medication.       Bipolar 1 disorder ([place])    He has self decreased olanzapine, continues prozac and depakote. Advised I do recommend he continue to see psychiatrist, he will call and schedule f/u with Dr Josph Macho at [facility].       Benign prostatic hyperplasia    Stable PSA. DRE not done today.        Other Visit Diagnoses    Special screening for malignant neoplasms, colon       Relevant Orders   Fecal  occult blood, imunochemical       No orders of the defined types were placed in this encounter.  Orders Placed This Encounter  Procedures  . Fecal occult blood, imunochemical    Standing Status:   Future    Standing Expiration Date:   04/16/2020  . DG Bone Density    Standing Status:   Future    Standing Expiration Date:   06/17/2020    Order Specific Question:   Reason for Exam (SYMPTOM  OR DIAGNOSIS REQUIRED)    Answer:   f/u osteoporosis    Order Specific Question:   Preferred imaging location?    Answer:   [facility]    Follow up plan: Return in about 1 year (around 04/16/2020) for annual exam, prior fasting for blood work.  [name], MD

## 2019-04-23 ENCOUNTER — Other Ambulatory Visit (INDEPENDENT_AMBULATORY_CARE_PROVIDER_SITE_OTHER): Payer: Medicare Other

## 2019-04-23 ENCOUNTER — Encounter: Payer: Self-pay | Admitting: Family Medicine

## 2019-04-23 DIAGNOSIS — Z1211 Encounter for screening for malignant neoplasm of colon: Secondary | ICD-10-CM

## 2019-04-23 LAB — FECAL OCCULT BLOOD, IMMUNOCHEMICAL: Fecal Occult Bld: NEGATIVE

## 2019-04-23 LAB — FECAL OCCULT BLOOD, GUAIAC: Fecal Occult Blood: NEGATIVE

## 2019-05-09 ENCOUNTER — Other Ambulatory Visit: Payer: Self-pay

## 2019-05-09 ENCOUNTER — Ambulatory Visit (INDEPENDENT_AMBULATORY_CARE_PROVIDER_SITE_OTHER)
Admission: RE | Admit: 2019-05-09 | Discharge: 2019-05-09 | Disposition: A | Discharge status: Home / Self Care | Payer: Medicare Other | Source: Ambulatory Visit | Attending: Family Medicine | Admitting: Family Medicine

## 2019-05-09 DIAGNOSIS — M818 Other osteoporosis without current pathological fracture: Secondary | ICD-10-CM | POA: Diagnosis not present

## 2019-05-12 DIAGNOSIS — M818 Other osteoporosis without current pathological fracture: Secondary | ICD-10-CM | POA: Diagnosis not present

## 2019-05-18 ENCOUNTER — Encounter: Payer: Self-pay | Admitting: Family Medicine

## 2019-07-16 ENCOUNTER — Other Ambulatory Visit: Payer: Self-pay | Admitting: Family Medicine

## 2019-07-17 DIAGNOSIS — H5213 Myopia, bilateral: Secondary | ICD-10-CM | POA: Diagnosis not present

## 2019-08-07 DIAGNOSIS — F250 Schizoaffective disorder, bipolar type: Secondary | ICD-10-CM | POA: Diagnosis not present

## 2019-10-29 DIAGNOSIS — F1020 Alcohol dependence, uncomplicated: Secondary | ICD-10-CM | POA: Diagnosis not present

## 2019-10-29 DIAGNOSIS — F419 Anxiety disorder, unspecified: Secondary | ICD-10-CM | POA: Diagnosis not present

## 2019-10-29 DIAGNOSIS — F250 Schizoaffective disorder, bipolar type: Secondary | ICD-10-CM | POA: Diagnosis not present

## 2019-10-30 ENCOUNTER — Other Ambulatory Visit: Payer: Self-pay

## 2019-10-30 ENCOUNTER — Ambulatory Visit (INDEPENDENT_AMBULATORY_CARE_PROVIDER_SITE_OTHER): Payer: Medicare Other | Admitting: Family Medicine

## 2019-10-30 ENCOUNTER — Encounter: Payer: Self-pay | Admitting: Family Medicine

## 2019-10-30 VITALS — BP 124/78 | HR 79 | Temp 97.6°F | Ht 70.0 in | Wt 203.2 lb

## 2019-10-30 DIAGNOSIS — R030 Elevated blood-pressure reading, without diagnosis of hypertension: Secondary | ICD-10-CM

## 2019-10-30 DIAGNOSIS — F250 Schizoaffective disorder, bipolar type: Secondary | ICD-10-CM

## 2019-10-30 DIAGNOSIS — M8580 Other specified disorders of bone density and structure, unspecified site: Secondary | ICD-10-CM

## 2019-10-30 DIAGNOSIS — F319 Bipolar disorder, unspecified: Secondary | ICD-10-CM | POA: Diagnosis not present

## 2019-10-30 MED ORDER — CALCIUM CITRATE 333 MG PO TABS: 2.0000 | ORAL_TABLET | Freq: Every day | ORAL | Status: DC

## 2019-10-30 NOTE — Patient Instructions (Signed)
Blood pressures are looking good today!  Watch salt/sodium in the diet. If you can, buy BP cuff to have at home and let me know if staying >140/90.  Return as needed or in 6 months for physical.

## 2019-10-30 NOTE — Assessment & Plan Note (Signed)
Reviewed dexa results.  Plan fosamax for 5 yrs.  Continue calcium/vit D.

## 2019-10-30 NOTE — Assessment & Plan Note (Signed)
Appreciate psych care.  ?

## 2019-10-30 NOTE — Progress Notes (Signed)
This visit was conducted in person.  BP 124/78 (BP Location: Left Arm, Patient Position: Sitting, Cuff Size: Large)   Pulse 79   Temp 97.6 F (36.4 C) (Temporal)   Ht 5\' 10"  (1.778 m)   Wt 203 lb 4 oz (92.2 kg)   SpO2 97%   BMI 29.16 kg/m    CC: discuss HTN  Subjective:    Patient ID: Robert Fowler, male    DOB: 09-11-1951, 69 y.o.   MRN: [id number]  HPI: Robert Fowler is a 68 y.o. male presenting on 10/30/2019 for Elevated Blood Pressure (C/o elevated BP readings at [facility] appt in 07/2019.  Here for BP chk. )   He is planning to buy BP cuff to have at home. No HA, vision changes, CP/tightness, SOB, leg swelling. Some indigestion.   Saw behavioral health yesterday - olanzapine increased to 5mg /10mg  daily.   Hasn't been walking because was afraid to walk alone as there are loose dogs in his neighborhood - but to start walking with friend.      Relevant past medical, surgical, family and social history reviewed and updated as indicated. Interim medical history since our last visit reviewed. Allergies and medications reviewed and updated. Outpatient Medications Prior to Visit  Medication Sig Dispense Refill  . acetaminophen (TYLENOL) 500 MG tablet Take 1,000 mg by mouth every 6 (six) hours as needed for moderate pain or headache.    . alendronate (FOSAMAX) 70 MG tablet TAKE 1 TABLET BY MOUTH EVERY 7 DAYS WITH A FULL GLASS OF WATER ON AN EMPTY STOMACH 12 tablet 3  . aspirin 81 MG tablet Take 81 mg by mouth daily.    . benztropine (COGENTIN) 0.5 MG tablet Take 0.5 mg by mouth at bedtime.    . Cholecalciferol (VITAMIN D) 2000 units CAPS Take 1 capsule by mouth daily.    . divalproex (DEPAKOTE ER) 250 MG 24 hr tablet     . FLUoxetine (PROZAC) 20 MG capsule     . hydrOXYzine (ATARAX/VISTARIL) 25 MG tablet As needed    . Melatonin 5 MG TABS Take 1-5 mg by mouth at bedtime as needed.     . neomycin-polymyxin-pramoxine (NEOSPORIN PLUS) 1 % cream Apply 1 application topically 2 (two) times daily  as needed (minor cuts/wounds).    . OLANZapine (ZYPREXA) 10 MG tablet Take 1 tablet (10 mg total) by mouth at bedtime.    Marland Kitchen OLANZapine zydis (ZYPREXA) 5 MG disintegrating tablet Take 5 mg by mouth daily. Takes in mornings    . tamsulosin (FLOMAX) 0.4 MG CAPS capsule TAKE 1 CAPSULE BY MOUTH EVERY DAY 90 capsule 2   No facility-administered medications prior to visit.     Per HPI unless specifically indicated in ROS section below Review of Systems Objective:    BP 124/78 (BP Location: Left Arm, Patient Position: Sitting, Cuff Size: Large)   Pulse 79   Temp 97.6 F (36.4 C) (Temporal)   Ht 5\' 10"  (1.778 m)   Wt 203 lb 4 oz (92.2 kg)   SpO2 97%   BMI 29.16 kg/m   Wt Readings from Last 3 Encounters:  10/30/19 203 lb 4 oz (92.2 kg)  04/17/19 193 lb 1.6 oz (87.6 kg)  04/13/18 199 lb (90.3 kg)    Physical Exam Vitals and nursing note reviewed.  Constitutional:      Appearance: Normal appearance. He is not ill-appearing.  Eyes:     Extraocular Movements: Extraocular movements intact.     Pupils: Pupils are equal, round,  and reactive to light.  Cardiovascular:     Rate and Rhythm: Normal rate and regular rhythm.     Pulses: Normal pulses.     Heart sounds: Normal heart sounds. No murmur.  Pulmonary:     Effort: Pulmonary effort is normal. No respiratory distress.     Breath sounds: Normal breath sounds. No wheezing, rhonchi or rales.  Musculoskeletal:     Right lower leg: No edema.     Left lower leg: No edema.  Neurological:     Mental Status: He is alert.  Psychiatric:        Mood and Affect: Mood normal.        Behavior: Behavior normal.       Results for orders placed or performed in visit on 04/23/19  Fecal Occult Blood, Guaiac  Result Value Ref Range   Fecal Occult Blood Negative    Assessment & Plan:  This visit occurred during the SARS-CoV-2 public health emergency.  Safety protocols were in place, including screening questions prior to the visit, additional  usage of staff PPE, and extensive cleaning of exam room while observing appropriate contact time as indicated for disinfecting solutions.   Problem List Items Addressed This Visit    Schizoaffective disorder [facility])    Appreciate psych care.       Osteopenia    Reviewed dexa results.  Plan fosamax for 5 yrs.  Continue calcium/vit D.       Elevated blood pressure reading in office without diagnosis of hypertension - Primary    BP has returned to normal.  He will buy BP cuff to monitor this at home. Let me know if consistently >140/90.       Bipolar 1 disorder ([place])    Appreciate psych care.           Meds ordered this encounter  Medications  . Calcium Citrate 333 MG TABS    Sig: Take 2 tablets by mouth daily.   No orders of the defined types were placed in this encounter.  Patient Instructions  Blood pressures are looking good today!  Watch salt/sodium in the diet. If you can, buy BP cuff to have at home and let me know if staying >140/90.  Return as needed or in 6 months for physical.    Follow up plan: Return in about 6 months (around 04/28/2020) for annual exam, prior fasting for blood work.  [name], MD

## 2019-10-30 NOTE — Assessment & Plan Note (Signed)
BP has returned to normal.  He will buy BP cuff to monitor this at home. Let me know if consistently >140/90.

## 2019-11-06 ENCOUNTER — Encounter: Payer: Self-pay | Admitting: Family Medicine

## 2019-11-19 ENCOUNTER — Ambulatory Visit (INDEPENDENT_AMBULATORY_CARE_PROVIDER_SITE_OTHER): Payer: Medicare Other | Admitting: Family Medicine

## 2019-11-19 ENCOUNTER — Other Ambulatory Visit: Payer: Self-pay

## 2019-11-19 ENCOUNTER — Encounter: Payer: Self-pay | Admitting: Family Medicine

## 2019-11-19 VITALS — BP 132/80 | HR 66 | Temp 97.8°F | Ht 70.0 in | Wt 204.6 lb

## 2019-11-19 DIAGNOSIS — K629 Disease of anus and rectum, unspecified: Secondary | ICD-10-CM | POA: Diagnosis not present

## 2019-11-19 DIAGNOSIS — N400 Enlarged prostate without lower urinary tract symptoms: Secondary | ICD-10-CM | POA: Diagnosis not present

## 2019-11-19 MED ORDER — HYDROCORTISONE ACETATE 25 MG RE SUPP: 25.0000 mg | Freq: Two times a day (BID) | RECTAL | 0 refills | Status: DC

## 2019-11-19 NOTE — Patient Instructions (Addendum)
Exam today overall ok - but with enlarged prostate. Continue flomax.  Some rawness around bottom - may use desitin barrier cream for this.  If hemorrhoid flares up again, may try steroid suppository sent to pharmacy.

## 2019-11-19 NOTE — Assessment & Plan Note (Signed)
Evidence of this on exam. He is on flomax. Consider finasteride if BPH symptoms not well controlled.

## 2019-11-19 NOTE — Assessment & Plan Note (Signed)
Overall benign exam except for some perianal rawness. No obvious fissure or int/ext hemorrhoid. Supportive care reviewed - rec desitin barrier cream for rawness, Rx anusol supp PRN if int hemorrhoid seems to flare.

## 2019-11-19 NOTE — Progress Notes (Signed)
This visit was conducted in person.  BP 132/80 (BP Location: Left Arm, Patient Position: Sitting, Cuff Size: Large)   Pulse 66   Temp 97.8 F (36.6 C) (Temporal)   Ht 5\' 10"  (1.778 m)   Wt 204 lb 9 oz (92.8 kg)   SpO2 95%   BMI 29.35 kg/m    CC: perirectal bump Subjective:    Patient ID: Robert Fowler, male    DOB: [birth year], 69 y.o.   MRN: [id number]  HPI: Robert Fowler is a 69 y.o. male presenting on 11/19/2019 for Mass (C/o bump in rectum. Says it feels smaller today.  Noticed about 1 wk ago. Denies any pain.  H/o hemorrhoids.  Tried witch hazel on toilet paper and generic Preparation ointment. )   2 wk h/o enlarging hemorrhoid ?internal. No pain. No bleeding. Bowels moving normally - every morning, some straining. Tends to use harder toilet paper. Occasional diarrhea.  Would classify water and fiber intake as adequate.  No abd pain, nausea/vomiting, constipation.  Denies LUTS.   Has been using PrepH and witch hazel for known hemorrhoids. Using daily.      Relevant past medical, surgical, family and social history reviewed and updated as indicated. Interim medical history since our last visit reviewed. Allergies and medications reviewed and updated. Outpatient Medications Prior to Visit  Medication Sig Dispense Refill  . acetaminophen (TYLENOL) 500 MG tablet Take 1,000 mg by mouth every 6 (six) hours as needed for moderate pain or headache.    . alendronate (FOSAMAX) 70 MG tablet TAKE 1 TABLET BY MOUTH EVERY 7 DAYS WITH A FULL GLASS OF WATER ON AN EMPTY STOMACH 12 tablet 3  . aspirin 81 MG tablet Take 81 mg by mouth daily.    . benztropine (COGENTIN) 0.5 MG tablet Take 0.5 mg by mouth at bedtime.    . Calcium Citrate 333 MG TABS Take 2 tablets by mouth daily.    . Cholecalciferol (VITAMIN D) 2000 units CAPS Take 1 capsule by mouth daily.    . divalproex (DEPAKOTE ER) 250 MG 24 hr tablet     . FLUoxetine (PROZAC) 20 MG capsule     . hydrOXYzine (ATARAX/VISTARIL) 25 MG tablet  As needed    . Melatonin 5 MG TABS Take 1-5 mg by mouth at bedtime as needed.     . neomycin-polymyxin-pramoxine (NEOSPORIN PLUS) 1 % cream Apply 1 application topically 2 (two) times daily as needed (minor cuts/wounds).    . OLANZapine (ZYPREXA) 10 MG tablet Take 1 tablet (10 mg total) by mouth at bedtime.    Marland Kitchen OLANZapine zydis (ZYPREXA) 5 MG disintegrating tablet Take 5 mg by mouth daily. Takes in mornings    . tamsulosin (FLOMAX) 0.4 MG CAPS capsule TAKE 1 CAPSULE BY MOUTH EVERY DAY 90 capsule 2   No facility-administered medications prior to visit.     Per HPI unless specifically indicated in ROS section below Review of Systems Objective:    BP 132/80 (BP Location: Left Arm, Patient Position: Sitting, Cuff Size: Large)   Pulse 66   Temp 97.8 F (36.6 C) (Temporal)   Ht 5\' 10"  (1.778 m)   Wt 204 lb 9 oz (92.8 kg)   SpO2 95%   BMI 29.35 kg/m   Wt Readings from Last 3 Encounters:  11/19/19 204 lb 9 oz (92.8 kg)  10/30/19 203 lb 4 oz (92.2 kg)  04/17/19 193 lb 1.6 oz (87.6 kg)    Physical Exam Vitals and nursing note reviewed.  Constitutional:  Appearance: Normal appearance. He is not ill-appearing.  Genitourinary:    Prostate: Enlarged (30-40gm). Not tender and no nodules present.     Rectum: Normal. No mass, tenderness, anal fissure, external hemorrhoid or internal hemorrhoid. Normal anal tone.     Comments: Some perianal rawness without obvious mass or obvious hemorrhoid Neurological:     Mental Status: He is alert.       Results for orders placed or performed in visit on 04/23/19  Fecal Occult Blood, Guaiac  Result Value Ref Range   Fecal Occult Blood Negative    Assessment & Plan:  This visit occurred during the SARS-CoV-2 public health emergency.  Safety protocols were in place, including screening questions prior to the visit, additional usage of staff PPE, and extensive cleaning of exam room while observing appropriate contact time as indicated for  disinfecting solutions.   Problem List Items Addressed This Visit    Benign prostatic hyperplasia    Evidence of this on exam. He is on flomax. Consider finasteride if BPH symptoms not well controlled.       Anal disorder    Overall benign exam except for some perianal rawness. No obvious fissure or int/ext hemorrhoid. Supportive care reviewed - rec desitin barrier cream for rawness, Rx anusol supp PRN if int hemorrhoid seems to flare.           Meds ordered this encounter  Medications  . hydrocortisone (ANUSOL-HC) 25 MG suppository    Sig: Place 1 suppository (25 mg total) rectally 2 (two) times daily.    Dispense:  12 suppository    Refill:  0   No orders of the defined types were placed in this encounter.   Patient Instructions  Exam today overall ok - but with enlarged prostate. Continue flomax.  Some rawness around bottom - may use desitin barrier cream for this.  If hemorrhoid flares up again, may try steroid suppository sent to pharmacy.    Follow up plan: No follow-ups on file.  [name], MD

## 2019-11-22 ENCOUNTER — Ambulatory Visit: Payer: Medicare Other | Attending: Internal Medicine

## 2019-11-22 DIAGNOSIS — Z23 Encounter for immunization: Secondary | ICD-10-CM

## 2019-11-22 NOTE — Progress Notes (Signed)
   Covid-19 Vaccination Clinic  Name:  Robert Fowler    MRN: [id number] DOB: 10-14-1950  11/22/2019  [name] was observed post Covid-19 immunization for 15 minutes without incidence. He was provided with Vaccine Information Sheet and instruction to access the V-Safe system.   [name] was instructed to call 911 with any severe reactions post vaccine: Marland Kitchen Difficulty breathing  . Swelling of your face and throat  . A fast heartbeat  . A bad rash all over your body  . Dizziness and weakness    Immunizations Administered    Name Date Dose VIS Date Route   Pfizer COVID-19 Vaccine 11/22/2019  9:00 AM 0.3 mL 09/06/2019 Intramuscular   Manufacturer: [place]   Lot: [id number]   [place]: [id number]

## 2019-12-17 ENCOUNTER — Ambulatory Visit: Payer: Medicare Other | Attending: Internal Medicine

## 2019-12-17 DIAGNOSIS — Z23 Encounter for immunization: Secondary | ICD-10-CM

## 2019-12-17 NOTE — Progress Notes (Signed)
   Covid-19 Vaccination Clinic  Name:  Enam Mcmenamin    MRN: [id number] DOB: December 09, 1950  12/17/2019  Mr. Joyner was observed post Covid-19 immunization for 15 minutes without incident. He was provided with Vaccine Information Sheet and instruction to access the V-Safe system.   Mr. Fatemi was instructed to call 911 with any severe reactions post vaccine: Marland Kitchen Difficulty breathing  . Swelling of face and throat  . A fast heartbeat  . A bad rash all over body  . Dizziness and weakness   Immunizations Administered    Name Date Dose VIS Date Route   Pfizer COVID-19 Vaccine 12/17/2019  9:46 AM 0.3 mL 09/06/2019 Intramuscular   Manufacturer: [place]   Lot: [id number]   [place]: [id number]

## 2020-01-14 DIAGNOSIS — F250 Schizoaffective disorder, bipolar type: Secondary | ICD-10-CM | POA: Diagnosis not present

## 2020-01-20 ENCOUNTER — Other Ambulatory Visit: Payer: Self-pay | Admitting: Family Medicine

## 2020-04-01 DIAGNOSIS — F250 Schizoaffective disorder, bipolar type: Secondary | ICD-10-CM | POA: Diagnosis not present

## 2020-04-21 ENCOUNTER — Ambulatory Visit (INDEPENDENT_AMBULATORY_CARE_PROVIDER_SITE_OTHER): Payer: Medicare Other

## 2020-04-21 ENCOUNTER — Other Ambulatory Visit: Payer: Self-pay | Admitting: Family Medicine

## 2020-04-21 ENCOUNTER — Other Ambulatory Visit (INDEPENDENT_AMBULATORY_CARE_PROVIDER_SITE_OTHER): Payer: Medicare Other

## 2020-04-21 ENCOUNTER — Other Ambulatory Visit: Payer: Self-pay

## 2020-04-21 DIAGNOSIS — M8580 Other specified disorders of bone density and structure, unspecified site: Secondary | ICD-10-CM

## 2020-04-21 DIAGNOSIS — F319 Bipolar disorder, unspecified: Secondary | ICD-10-CM | POA: Diagnosis not present

## 2020-04-21 DIAGNOSIS — F1090 Alcohol use, unspecified, uncomplicated: Secondary | ICD-10-CM

## 2020-04-21 DIAGNOSIS — Z0000 Encounter for general adult medical examination without abnormal findings: Secondary | ICD-10-CM

## 2020-04-21 DIAGNOSIS — E871 Hypo-osmolality and hyponatremia: Secondary | ICD-10-CM | POA: Diagnosis not present

## 2020-04-21 DIAGNOSIS — Z7289 Other problems related to lifestyle: Secondary | ICD-10-CM | POA: Diagnosis not present

## 2020-04-21 DIAGNOSIS — N400 Enlarged prostate without lower urinary tract symptoms: Secondary | ICD-10-CM

## 2020-04-21 LAB — CBC WITH DIFFERENTIAL/PLATELET
Basophils Absolute: 0 10*3/uL (ref 0.0–0.1)
Basophils Relative: 1.3 % (ref 0.0–3.0)
Eosinophils Absolute: 0.1 10*3/uL (ref 0.0–0.7)
Eosinophils Relative: 3.6 % (ref 0.0–5.0)
HCT: 40.8 % (ref 39.0–52.0)
Hemoglobin: 14.1 g/dL (ref 13.0–17.0)
Lymphocytes Relative: 29.3 % (ref 12.0–46.0)
Lymphs Abs: 1 10*3/uL (ref 0.7–4.0)
MCHC: 34.6 g/dL (ref 30.0–36.0)
MCV: 90.9 fl (ref 78.0–100.0)
Monocytes Absolute: 0.4 10*3/uL (ref 0.1–1.0)
Monocytes Relative: 11.7 % (ref 3.0–12.0)
Neutro Abs: 1.8 10*3/uL (ref 1.4–7.7)
Neutrophils Relative %: 54.1 % (ref 43.0–77.0)
Platelets: 158 10*3/uL (ref 150.0–400.0)
RBC: 4.49 Mil/uL (ref 4.22–5.81)
RDW: 14.4 % (ref 11.5–15.5)
WBC: 3.3 10*3/uL — ABNORMAL LOW (ref 4.0–10.5)

## 2020-04-21 LAB — COMPREHENSIVE METABOLIC PANEL
ALT: 12 U/L (ref 0–53)
AST: 9 U/L (ref 0–37)
Albumin: 4.4 g/dL (ref 3.5–5.2)
Alkaline Phosphatase: 47 U/L (ref 39–117)
BUN: 7 mg/dL (ref 6–23)
CO2: 28 mEq/L (ref 19–32)
Calcium: 9.5 mg/dL (ref 8.4–10.5)
Chloride: 101 mEq/L (ref 96–112)
Creatinine, Ser: 0.84 mg/dL (ref 0.40–1.50)
GFR: 90.83 mL/min (ref >60.00)
Glucose, Bld: 93 mg/dL (ref 70–99)
Potassium: 4.5 mEq/L (ref 3.5–5.1)
Sodium: 136 mEq/L (ref 135–145)
Total Bilirubin: 1 mg/dL (ref 0.2–1.2)
Total Protein: 6.5 g/dL (ref 6.0–8.3)

## 2020-04-21 LAB — LIPID PANEL
Cholesterol: 165 mg/dL (ref 0–200)
HDL: 66.2 mg/dL (ref >39.00)
LDL Cholesterol: 79 mg/dL (ref 0–99)
NonHDL: 99.14
Total CHOL/HDL Ratio: 2
Triglycerides: 99 mg/dL (ref 0.0–149.0)
VLDL: 19.8 mg/dL (ref 0.0–40.0)

## 2020-04-21 LAB — VITAMIN D 25 HYDROXY (VIT D DEFICIENCY, FRACTURES): VITD: 51.11 ng/mL (ref 30.00–100.00)

## 2020-04-21 LAB — PSA: PSA: 0.86 ng/mL (ref 0.10–4.00)

## 2020-04-21 NOTE — Progress Notes (Signed)
PCP notes:  Health Maintenance: No gaps noted   Abnormal Screenings: none   Patient concerns: Wants to discuss his anxiety issues   Nurse concerns: none   Next PCP appt.: 04/28/2020 @ 9:30 am

## 2020-04-21 NOTE — Patient Instructions (Signed)
Robert Fowler , Thank you for taking time to come for your Medicare Wellness Visit. I appreciate your ongoing commitment to your health goals. Please review the following plan we discussed and let me know if I can assist you in the future.   Screening recommendations/referrals: Colonoscopy: FOBT completed 04/23/2019, due 04/22/2020 Recommended yearly ophthalmology/optometry visit for glaucoma screening and checkup Recommended yearly dental visit for hygiene and checkup  Vaccinations: Influenza vaccine: Up to date, completed 06/20/2019, due 04/2020 Pneumococcal vaccine: Completed series Tdap vaccine: Up to date, completed 10/29/2013, due 10/2023 Shingles vaccine: due, check with your insurance regarding coverage   Covid-19: Completed series  Advanced directives: copy in chart  Conditions/risks identified: none  Next appointment: Follow up in one year for your annual wellness visit.   Preventive Care 3 Years and Older, Male Preventive care refers to lifestyle choices and visits with your health care provider that can promote health and wellness. What does preventive care include?  A yearly physical exam. This is also called an annual well check.  Dental exams once or twice a year.  Routine eye exams. Ask your health care provider how often you should have your eyes checked.  Personal lifestyle choices, including:  Daily care of your teeth and gums.  Regular physical activity.  Eating a healthy diet.  Avoiding tobacco and drug use.  Limiting alcohol use.  Practicing safe sex.  Taking low doses of aspirin every day.  Taking vitamin and mineral supplements as recommended by your health care provider. What happens during an annual well check? The services and screenings done by your health care provider during your annual well check will depend on your age, overall health, lifestyle risk factors, and family history of disease. Counseling  Your health care provider may ask you  questions about your:  Alcohol use.  Tobacco use.  Drug use.  Emotional well-being.  Home and relationship well-being.  Sexual activity.  Eating habits.  History of falls.  Memory and ability to understand (cognition).  Work and work Statistician. Screening  You may have the following tests or measurements:  Height, weight, and BMI.  Blood pressure.  Lipid and cholesterol levels. These may be checked every 5 years, or more frequently if you are over 74 years old.  Skin check.  Lung cancer screening. You may have this screening every year starting at age 18 if you have a 30-pack-year history of smoking and currently smoke or have quit within the past 15 years.  Fecal occult blood test (FOBT) of the stool. You may have this test every year starting at age 38.  Flexible sigmoidoscopy or colonoscopy. You may have a sigmoidoscopy every 5 years or a colonoscopy every 10 years starting at age 80.  Prostate cancer screening. Recommendations will vary depending on your family history and other risks.  Hepatitis C blood test.  Hepatitis B blood test.  Sexually transmitted disease (STD) testing.  Diabetes screening. This is done by checking your blood sugar (glucose) after you have not eaten for a while (fasting). You may have this done every 1-3 years.  Abdominal aortic aneurysm (AAA) screening. You may need this if you are a current or former smoker.  Osteoporosis. You may be screened starting at age 64 if you are at high risk. Talk with your health care provider about your test results, treatment options, and if necessary, the need for more tests. Vaccines  Your health care provider may recommend certain vaccines, such as:  Influenza vaccine. This is recommended  every year.  Tetanus, diphtheria, and acellular pertussis (Tdap, Td) vaccine. You may need a Td booster every 10 years.  Zoster vaccine. You may need this after age 39.  Pneumococcal 13-valent conjugate  (PCV13) vaccine. One dose is recommended after age 16.  Pneumococcal polysaccharide (PPSV23) vaccine. One dose is recommended after age 70. Talk to your health care provider about which screenings and vaccines you need and how often you need them. This information is not intended to replace advice given to you by your health care provider. Make sure you discuss any questions you have with your health care provider. Document Released: 10/09/2015 Document Revised: 06/01/2016 Document Reviewed: 07/14/2015 Elsevier Interactive Patient Education  2017 [place] Prevention in the Home Falls can cause injuries. They can happen to people of all ages. There are many things you can do to make your home safe and to help prevent falls. What can I do on the outside of my home?  Regularly fix the edges of walkways and driveways and fix any cracks.  Remove anything that might make you trip as you walk through a door, such as a raised step or threshold.  Trim any bushes or trees on the path to your home.  Use bright outdoor lighting.  Clear any walking paths of anything that might make someone trip, such as rocks or tools.  Regularly check to see if handrails are loose or broken. Make sure that both sides of any steps have handrails.  Any raised decks and porches should have guardrails on the edges.  Have any leaves, snow, or ice cleared regularly.  Use sand or salt on walking paths during winter.  Clean up any spills in your garage right away. This includes oil or grease spills. What can I do in the bathroom?  Use night lights.  Install grab bars by the toilet and in the tub and shower. Do not use towel bars as grab bars.  Use non-skid mats or decals in the tub or shower.  If you need to sit down in the shower, use a plastic, non-slip stool.  Keep the floor dry. Clean up any water that spills on the floor as soon as it happens.  Remove soap buildup in the tub or shower  regularly.  Attach bath mats securely with double-sided non-slip rug tape.  Do not have throw rugs and other things on the floor that can make you trip. What can I do in the bedroom?  Use night lights.  Make sure that you have a light by your bed that is easy to reach.  Do not use any sheets or blankets that are too big for your bed. They should not hang down onto the floor.  Have a firm chair that has side arms. You can use this for support while you get dressed.  Do not have throw rugs and other things on the floor that can make you trip. What can I do in the kitchen?  Clean up any spills right away.  Avoid walking on wet floors.  Keep items that you use a lot in easy-to-reach places.  If you need to reach something above you, use a strong step stool that has a grab bar.  Keep electrical cords out of the way.  Do not use floor polish or wax that makes floors slippery. If you must use wax, use non-skid floor wax.  Do not have throw rugs and other things on the floor that can make you trip. What  can I do with my stairs?  Do not leave any items on the stairs.  Make sure that there are handrails on both sides of the stairs and use them. Fix handrails that are broken or loose. Make sure that handrails are as long as the stairways.  Check any carpeting to make sure that it is firmly attached to the stairs. Fix any carpet that is loose or worn.  Avoid having throw rugs at the top or bottom of the stairs. If you do have throw rugs, attach them to the floor with carpet tape.  Make sure that you have a light switch at the top of the stairs and the bottom of the stairs. If you do not have them, ask someone to add them for you. What else can I do to help prevent falls?  Wear shoes that:  Do not have high heels.  Have rubber bottoms.  Are comfortable and fit you well.  Are closed at the toe. Do not wear sandals.  If you use a stepladder:  Make sure that it is fully  opened. Do not climb a closed stepladder.  Make sure that both sides of the stepladder are locked into place.  Ask someone to hold it for you, if possible.  Clearly mark and make sure that you can see:  Any grab bars or handrails.  First and last steps.  Where the edge of each step is.  Use tools that help you move around (mobility aids) if they are needed. These include:  Canes.  Walkers.  Scooters.  Crutches.  Turn on the lights when you go into a dark area. Replace any light bulbs as soon as they burn out.  Set up your furniture so you have a clear path. Avoid moving your furniture around.  If any of your floors are uneven, fix them.  If there are any pets around you, be aware of where they are.  Review your medicines with your doctor. Some medicines can make you feel dizzy. This can increase your chance of falling. Ask your doctor what other things that you can do to help prevent falls. This information is not intended to replace advice given to you by your health care provider. Make sure you discuss any questions you have with your health care provider. Document Released: 07/09/2009 Document Revised: 02/18/2016 Document Reviewed: 10/17/2014 Elsevier Interactive Patient Education  2017 [employer].

## 2020-04-21 NOTE — Progress Notes (Signed)
Subjective:   Robert Fowler is a 69 y.o. male who presents for Medicare Annual/Subsequent preventive examination.  Review of Systems: N/A     I connected with the patient today by telephone and verified that I am speaking with the correct person using two identifiers. Location patient: home Location nurse: work Persons participating in the virtual visit: patient, Marine scientist.   I discussed the limitations, risks, security and privacy concerns of performing an evaluation and management service by telephone and the availability of in person appointments. I also discussed with the patient that there may be a patient responsible charge related to this service. The patient expressed understanding and verbally consented to this telephonic visit.    Interactive audio and video telecommunications were attempted between this nurse and patient, however failed, due to patient having technical difficulties OR patient did not have access to video capability.  We continued and completed visit with audio only.     Cardiac Risk Factors include: advanced age (>57men, >64 women);male gender     Objective:    Today's Vitals   There is no height or weight on file to calculate BMI.  Advanced Directives 04/21/2020 03/18/2019 04/13/2018 03/14/2018 04/11/2017 03/08/2017  Does Patient Have a Medical Advance Directive? Yes Yes Yes Yes No Yes  Type of Paramedic of [place];Living will [place];Living will [place];Living will [place];Living will - [place];Living will  Does patient want to make changes to medical advance directive? - No - Patient declined No - Patient declined - - -  Copy of [place] in Chart? Yes - validated most recent copy scanned in chart (See row information) No - copy requested No - copy requested No - copy requested - No - copy requested  Would patient like information on  creating a medical advance directive? - - - - No - Patient declined -  Some encounter information is confidential and restricted. Go to Review Flowsheets activity to see all data.    Current Medications (verified) Outpatient Encounter Medications as of 04/21/2020  Medication Sig  . acetaminophen (TYLENOL) 500 MG tablet Take 1,000 mg by mouth every 6 (six) hours as needed for moderate pain or headache.  . alendronate (FOSAMAX) 70 MG tablet TAKE 1 TABLET BY MOUTH EVERY 7 DAYS WITH A FULL GLASS OF WATER ON AN EMPTY STOMACH  . aspirin 81 MG tablet Take 81 mg by mouth daily.  . benztropine (COGENTIN) 0.5 MG tablet Take 0.5 mg by mouth at bedtime.  . Calcium Citrate 333 MG TABS Take 2 tablets by mouth daily.  . Cholecalciferol (VITAMIN D) 2000 units CAPS Take 1 capsule by mouth daily.  . divalproex (DEPAKOTE ER) 250 MG 24 hr tablet   . FLUoxetine (PROZAC) 20 MG capsule   . hydrocortisone (ANUSOL-HC) 25 MG suppository Place 1 suppository (25 mg total) rectally 2 (two) times daily.  . hydrOXYzine (ATARAX/VISTARIL) 25 MG tablet As needed  . Melatonin 5 MG TABS Take 1-5 mg by mouth at bedtime as needed.   . neomycin-polymyxin-pramoxine (NEOSPORIN PLUS) 1 % cream Apply 1 application topically 2 (two) times daily as needed (minor cuts/wounds).  . OLANZapine (ZYPREXA) 10 MG tablet Take 1 tablet (10 mg total) by mouth at bedtime.  Marland Kitchen OLANZapine zydis (ZYPREXA) 5 MG disintegrating tablet Take 5 mg by mouth daily. Takes in mornings  . tamsulosin (FLOMAX) 0.4 MG CAPS capsule TAKE 1 CAPSULE BY MOUTH ONCE DAILY   No  facility-administered encounter medications on file as of 04/21/2020.    Allergies (verified) Patient has no known allergies.   History: Past Medical History:  Diagnosis Date  . Arthritis   . BENIGN PROSTATIC HYPERTROPHY 07/18/2007  . Bipolar disorder ([place])   . Habitual alcohol use   . Lesion of oral mucosa 03/21/2018   Biopsy 03/2018 Tami Ribas) - INFLAMMATORY FIBROUS HYPERPLASIA CONSISTENT  WITH DENTURE INJURY (EPULIS FISSURATUM).   . OSTEOPOROSIS 09/04/2007   DEXA -2.8 lumbar (03/2015)  . Pes planus   . Prediabetes 2014  . Schizoaffective disorder ([place])    h/o paranoid schizophrenia  . Wears dentures    full upper and lower   Past Surgical History:  Procedure Laterality Date  . INGUINAL HERNIA REPAIR Right 08/2014   Dr Gershon Crane  . MASS EXCISION N/A 04/13/2018   Procedure: EXCISION LOWER LIP ORAL  MASS;  Surgeon: Beverly Gust, MD;  Location: [facility];  Service: ENT;  Laterality: N/A;   Family History  Problem Relation Age of Onset  . Alcohol abuse Father   . Alcohol abuse Maternal Grandmother   . COPD Mother        smoker  . CAD Neg Hx   . Stroke Neg Hx   . Cancer Neg Hx   . Diabetes Neg Hx   . Hypertension Neg Hx    Social History   Socioeconomic History  . Marital status: Single    Spouse name: Not on file  . Number of children: Not on file  . Years of education: Not on file  . Highest education level: Not on file  Occupational History  . Not on file  Tobacco Use  . Smoking status: Former Smoker    Quit date: 09/26/2006    Years since quitting: 13.5  . Smokeless tobacco: Never Used  Vaping Use  . Vaping Use: Never used  Substance and Sexual Activity  . Alcohol use: Yes    Comment: occasional  . Drug use: Yes    Types: Marijuana  . Sexual activity: Not Currently  Other Topics Concern  . Not on file  Social History Narrative   Lives alone.     Mother and brother live nearby.   Occupation: Retail buyer at [employer]   Edu: 12th grade   Activity: mows yard   Diet: good water, fruits/vegetables daily      Disability evaluation - no disability (04/2014)   Social Determinants of Health   Financial Resource Strain: Low Risk   . Difficulty of Paying Living Expenses: Not hard at all  Food Insecurity: No Food Insecurity  . Worried About Charity fundraiser in the Last Year: Never true  . Ran Out of Food in the Last Year: Never true    Transportation Needs: No Transportation Needs  . Lack of Transportation (Medical): No  . Lack of Transportation (Non-Medical): No  Physical Activity: Inactive  . Days of Exercise per Week: 0 days  . Minutes of Exercise per Session: 0 min  Stress: Stress Concern Present  . Feeling of Stress : To some extent  Social Connections:   . Frequency of Communication with Friends and Family:   . Frequency of Social Gatherings with Friends and Family:   . Attends Religious Services:   . Active Member of Clubs or Organizations:   . Attends Archivist Meetings:   Marland Kitchen Marital Status:     Tobacco Counseling Counseling given: Not Answered   Clinical Intake:  Pre-visit preparation completed: Yes  Pain :  No/denies pain     Nutritional Risks: Nausea/ vomitting/ diarrhea (occasional diarrhea) Diabetes: No  How often do you need to have someone help you when you read instructions, pamphlets, or other written materials from your doctor or pharmacy?: 1 - Never What is the last grade level you completed in school?: 12th  Diabetic: No Nutrition Risk Assessment:  Has the patient had any N/V/D within the last 2 months?  Yes  Does the patient have any non-healing wounds?  No  Has the patient had any unintentional weight loss or weight gain?  No   Diabetes:  Is the patient diabetic?  No  If diabetic, was a CBG obtained today?  No  Did the patient bring in their glucometer from home?  No  How often do you monitor your CBG's? N/A.   Financial Strains and Diabetes Management:  Are you having any financial strains with the device, your supplies or your medication? N/A.  Does the patient want to be seen by Chronic Care Management for management of their diabetes?  N/A Would the patient like to be referred to a Nutritionist or for Diabetic Management?  N/A     Interpreter Needed?: No  Information entered by :: CJohnson, LPN   Activities of Daily Living In your present state of  health, do you have any difficulty performing the following activities: 04/21/2020  Hearing? N  Vision? Y  Comment some blurred vision  Difficulty concentrating or making decisions? N  Walking or climbing stairs? N  Dressing or bathing? N  Doing errands, shopping? N  Preparing Food and eating ? N  Using the Toilet? N  In the past six months, have you accidently leaked urine? N  Do you have problems with loss of bowel control? N  Managing your Medications? N  Managing your Finances? N  Housekeeping or managing your Housekeeping? N  Some recent data might be hidden    Patient Care Team: [name], MD as PCP - General (Family Medicine)  Indicate any recent Medical Services you may have received from other than [facility] providers in the past year (date may be approximate).     Assessment:   This is a routine wellness examination for [place].  Hearing/Vision screen  Hearing Screening   125Hz  250Hz  500Hz  1000Hz  2000Hz  3000Hz  4000Hz  6000Hz  8000Hz   Right ear:           Left ear:           Vision Screening Comments: Patient gets annual eye exams   Dietary issues and exercise activities discussed: Current Exercise Habits: The patient does not participate in regular exercise at present, Exercise limited by: None identified  Goals    . Patient Stated     Starting 03/18/19, I will continue to take medications as prescribed.     . Patient Stated     04/21/2020, I will maintain and continue medications as prescribed.       Depression Screen PHQ 2/9 Scores 04/21/2020 03/18/2019 03/14/2018 03/13/2017 03/08/2017 03/07/2016 03/03/2015  PHQ - 2 Score 0 0 0 0 0 0 1  PHQ- 9 Score 0 0 0 2 - - -    Fall Risk Fall Risk  04/21/2020 03/18/2019 03/14/2018 03/08/2017 03/07/2016  Falls in the past year? 1 0 Yes Yes No  Comment tripped on carpet - per pt, multiple falls after behavioral health hospitalization pt states he was pulling weeds and fell backwards in yard; no injury, no medical treatment -    Number falls in  past yr: 0 - 2 or more 1 -  Injury with Fall? 0 - No No -  Risk for fall due to : No Fall Risks - History of fall(s) Impaired balance/gait -  Risk for fall due to: Comment - - - - -  Follow up Falls evaluation completed;Falls prevention discussed - - - -    Any stairs in or around the home? Yes  If so, are there any without handrails? No  Home free of loose throw rugs in walkways, pet beds, electrical cords, etc? Yes  Adequate lighting in your home to reduce risk of falls? Yes   ASSISTIVE DEVICES UTILIZED TO PREVENT FALLS:  Life alert? No  Use of a cane, walker or w/c? No  Grab bars in the bathroom? No  Shower chair or bench in shower? No  Elevated toilet seat or a handicapped toilet? No   TIMED UP AND GO:  Was the test performed? N/A, telephonic visit .   Cognitive Function: MMSE - Mini Mental State Exam 04/21/2020 03/18/2019 03/14/2018 03/08/2017  Orientation to time 5 5 5 5   Orientation to Place 5 5 5 5   Registration 3 3 3 3   Attention/ Calculation 5 0 0 0  Recall 3 3 3 1   Recall-comments - - - pt was unable to recall 2 of 3 words  Language- name 2 objects - 0 0 0  Language- repeat 1 1 1 1   Language- follow 3 step command - 0 3 3  Language- read & follow direction - 0 0 0  Write a sentence - 0 0 0  Copy design - 0 0 0  Total score - 17 20 18   Mini Cog  Mini-Cog screen was completed. Maximum score is 22. A value of 0 denotes this part of the MMSE was not completed or the patient failed this part of the Mini-Cog screening.       Immunizations Immunization History  Administered Date(s) Administered  . Influenza Whole 07/09/2008, 06/26/2013  . Influenza, High Dose Seasonal PF 06/09/2018  . Influenza, Seasonal, Injecte, Preservative Fre 06/20/2019  . Influenza,inj,Quad PF,6+ Mos 07/30/2014, 08/09/2016  . Influenza-Unspecified 05/22/2017  . PFIZER SARS-COV-2 Vaccination 11/22/2019, 12/17/2019  . Pneumococcal Conjugate-13 03/08/2017  . Pneumococcal  Polysaccharide-23 03/14/2018  . Tdap 10/29/2013  . Zoster 07/30/2014    TDAP status: Up to date Flu Vaccine status: Up to date Pneumococcal vaccine status: Up to date Covid-19 vaccine status: Completed vaccines  Qualifies for Shingles Vaccine? Yes   Zostavax completed Yes   Shingrix Completed?: No.    Education has been provided regarding the importance of this vaccine. Patient has been advised to call insurance company to determine out of pocket expense if they have not yet received this vaccine. Advised may also receive vaccine at local pharmacy or Health Dept. Verbalized acceptance and understanding.  Screening Tests Health Maintenance  Topic Date Due  . DTAP VACCINES (1) 05/02/1951  . COLONOSCOPY  Never done  . COLON CANCER SCREENING ANNUAL FOBT  04/22/2020  . INFLUENZA VACCINE  04/26/2020  . DTaP/Tdap/Td (2 - Td or Tdap) 10/30/2023  . TETANUS/TDAP  10/30/2023  . COVID-19 Vaccine  Completed  . Hepatitis C Screening  Completed  . PNA vac Low Risk Adult  Completed    Health Maintenance  Health Maintenance Due  Topic Date Due  . DTAP VACCINES (1) 05/02/1951  . COLONOSCOPY  Never done    Colorectal cancer screening: Completed FOBT 04/23/2019. Repeat every 1 years  Lung Cancer Screening: (Low  Dose CT Chest recommended if Age 8-80 years, 30 pack-year currently smoking OR have quit w/in 15years.) does not qualify.    Additional Screening:  Hepatitis C Screening: does qualify; Completed 02/29/2016  Vision Screening: Recommended annual ophthalmology exams for early detection of glaucoma and other disorders of the eye. Is the patient up to date with their annual eye exam?  Yes  Who is the provider or what is the name of the office in which the patient attends annual eye exams? [facility], Dr. Ellie Lunch If pt is not established with a provider, would they like to be referred to a provider to establish care? No .   Dental Screening: Recommended annual dental exams  for proper oral hygiene  Community Resource Referral / Chronic Care Management: CRR required this visit?  No   CCM required this visit?  No      Plan:     I have personally reviewed and noted the following in the patient's chart:   . Medical and social history . Use of alcohol, tobacco or illicit drugs  . Current medications and supplements . Functional ability and status . Nutritional status . Physical activity . Advanced directives . List of other physicians . Hospitalizations, surgeries, and ER visits in previous 12 months . Vitals . Screenings to include cognitive, depression, and falls . Referrals and appointments  In addition, I have reviewed and discussed with patient certain preventive protocols, quality metrics, and best practice recommendations. A written personalized care plan for preventive services as well as general preventive health recommendations were provided to patient.   Due to this being a telephonic visit, the after visit summary with patients personalized plan was offered to patient via mail or my-chart. Patient preferred to pick up at office at next visit.   Andrez Grime, LPN   11/07/2480

## 2020-04-28 ENCOUNTER — Ambulatory Visit (INDEPENDENT_AMBULATORY_CARE_PROVIDER_SITE_OTHER): Payer: Medicare Other | Admitting: Family Medicine

## 2020-04-28 ENCOUNTER — Other Ambulatory Visit: Payer: Self-pay

## 2020-04-28 ENCOUNTER — Encounter: Payer: Self-pay | Admitting: Family Medicine

## 2020-04-28 VITALS — BP 150/90 | HR 61 | Temp 97.7°F | Ht 69.0 in | Wt 196.2 lb

## 2020-04-28 DIAGNOSIS — N401 Enlarged prostate with lower urinary tract symptoms: Secondary | ICD-10-CM

## 2020-04-28 DIAGNOSIS — Z0000 Encounter for general adult medical examination without abnormal findings: Secondary | ICD-10-CM

## 2020-04-28 DIAGNOSIS — F250 Schizoaffective disorder, bipolar type: Secondary | ICD-10-CM

## 2020-04-28 DIAGNOSIS — R030 Elevated blood-pressure reading, without diagnosis of hypertension: Secondary | ICD-10-CM

## 2020-04-28 DIAGNOSIS — F319 Bipolar disorder, unspecified: Secondary | ICD-10-CM

## 2020-04-28 DIAGNOSIS — M8580 Other specified disorders of bone density and structure, unspecified site: Secondary | ICD-10-CM

## 2020-04-28 DIAGNOSIS — R351 Nocturia: Secondary | ICD-10-CM

## 2020-04-28 DIAGNOSIS — Z7289 Other problems related to lifestyle: Secondary | ICD-10-CM

## 2020-04-28 DIAGNOSIS — F1090 Alcohol use, unspecified, uncomplicated: Secondary | ICD-10-CM

## 2020-04-28 MED ORDER — ALENDRONATE SODIUM 70 MG PO TABS: 70.0000 mg | ORAL_TABLET | ORAL | 3 refills | Status: DC

## 2020-04-28 MED ORDER — TAMSULOSIN HCL 0.4 MG PO CAPS: 0.4000 mg | ORAL_CAPSULE | Freq: Every day | ORAL | 3 refills | Status: DC

## 2020-04-28 NOTE — Patient Instructions (Addendum)
Pass by lab to pick up stool kit.  I'm glad you got your covid vaccine.  Consider new shingles shot.   Cut down on alcohol.  Blood pressure is staying high. Start monitoring at home, let us know readings. Work on backing off alcohol, increase water intake, avoid caffeinated beverages, limit salt in the diet, work on aerobic exercise.  Return in 1-2 months for BP recheck.   Health Maintenance After Age 67 After age 40, you are at a higher risk for certain long-term diseases and infections as well as injuries from falls. Falls are a major cause of broken bones and head injuries in people who are older than age 62. Getting regular preventive care can help to keep you healthy and well. Preventive care includes getting regular testing and making lifestyle changes as recommended by your health care provider. Talk with your health care provider about:  Which screenings and tests you should have. A screening is a test that checks for a disease when you have no symptoms.  A diet and exercise plan that is right for you. What should I know about screenings and tests to prevent falls? Screening and testing are the best ways to find a health problem early. Early diagnosis and treatment give you the best chance of managing medical conditions that are common after age 85. Certain conditions and lifestyle choices may make you more likely to have a fall. Your health care provider may recommend:  Regular vision checks. Poor vision and conditions such as cataracts can make you more likely to have a fall. If you wear glasses, make sure to get your prescription updated if your vision changes.  Medicine review. Work with your health care provider to regularly review all of the medicines you are taking, including over-the-counter medicines. Ask your health care provider about any side effects that may make you more likely to have a fall. Tell your health care provider if any medicines that you take make you feel dizzy or  sleepy.  Osteoporosis screening. Osteoporosis is a condition that causes the bones to get weaker. This can make the bones weak and cause them to break more easily.  Blood pressure screening. Blood pressure changes and medicines to control blood pressure can make you feel dizzy.  Strength and balance checks. Your health care provider may recommend certain tests to check your strength and balance while standing, walking, or changing positions.  Foot health exam. Foot pain and numbness, as well as not wearing proper footwear, can make you more likely to have a fall.  Depression screening. You may be more likely to have a fall if you have a fear of falling, feel emotionally low, or feel unable to do activities that you used to do.  Alcohol use screening. Using too much alcohol can affect your balance and may make you more likely to have a fall. What actions can I take to lower my risk of falls? General instructions  Talk with your health care provider about your risks for falling. Tell your health care provider if: ? You fall. Be sure to tell your health care provider about all falls, even ones that seem minor. ? You feel dizzy, sleepy, or off-balance.  Take over-the-counter and prescription medicines only as told by your health care provider. These include any supplements.  Eat a healthy diet and maintain a healthy weight. A healthy diet includes low-fat dairy products, low-fat (lean) meats, and fiber from whole grains, beans, and lots of fruits and vegetables. Home  safety  Remove any tripping hazards, such as rugs, cords, and clutter.  Install safety equipment such as grab bars in bathrooms and safety rails on stairs.  Keep rooms and walkways well-lit. Activity   Follow a regular exercise program to stay fit. This will help you maintain your balance. Ask your health care provider what types of exercise are appropriate for you.  If you need a cane or walker, use it as recommended by  your health care provider.  Wear supportive shoes that have nonskid soles. Lifestyle  Do not drink alcohol if your health care provider tells you not to drink.  If you drink alcohol, limit how much you have: ? 0-1 drink a day for women. ? 0-2 drinks a day for men.  Be aware of how much alcohol is in your drink. In the [place] one drink equals one typical bottle of beer (12 oz), one-half glass of wine (5 oz), or one shot of hard liquor (1 oz).  Do not use any products that contain nicotine or tobacco, such as cigarettes and e-cigarettes. If you need help quitting, ask your health care provider. Summary  Having a healthy lifestyle and getting preventive care can help to protect your health and wellness after age 17.  Screening and testing are the best way to find a health problem early and help you avoid having a fall. Early diagnosis and treatment give you the best chance for managing medical conditions that are more common for people who are older than age 20.  Falls are a major cause of broken bones and head injuries in people who are older than age 12. Take precautions to prevent a fall at home.  Work with your health care provider to learn what changes you can make to improve your health and wellness and to prevent falls. This information is not intended to replace advice given to you by your health care provider. Make sure you discuss any questions you have with your health care provider. Document Revised: 01/03/2019 Document Reviewed: 07/26/2017 Elsevier Patient Education  2020 [employer].

## 2020-04-28 NOTE — Assessment & Plan Note (Addendum)
Has increased wine intake - advised cut down on alcohol, reviewed long term health risks of habitual alcohol use.

## 2020-04-28 NOTE — Assessment & Plan Note (Signed)
Stable period on flomax. Continue.

## 2020-04-28 NOTE — Progress Notes (Signed)
This visit was conducted in person.  BP (!) 150/90 (BP Location: Right Arm, Patient Position: Sitting, Cuff Size: Normal)   Pulse 61   Temp 97.7 F (36.5 C) (Temporal)   Ht '5\' 9"'$  (1.753 m)   Wt 196 lb 3 oz (89 kg)   SpO2 97%   BMI 28.97 kg/m   Elevated on repeat testing 154/110  BP Readings from Last 3 Encounters:  04/28/20 (!) 150/90  11/19/19 132/80  10/30/19 124/78    CC: CPE Subjective:    Patient ID: Robert Fowler, male    DOB: November 24, 1950, 69 y.o.   MRN: [id number]  HPI: Robert Fowler is a 69 y.o. male presenting on 04/28/2020 for Annual Exam (Prt 2. )   Saw health advisor last week for medicare wellness visit. Note reviewed.    No exam data present    Clinical Support from 04/21/2020 in [place] [facility] Total Score 0      Fall Risk  04/21/2020 03/18/2019 03/14/2018 03/08/2017 03/07/2016  Falls in the past year? 1 0 Yes Yes No  Comment tripped on carpet - per pt, multiple falls after behavioral health hospitalization pt states he was pulling weeds and fell backwards in yard; no injury, no medical treatment -  Number falls in past yr: 0 - 2 or more 1 -  Injury with Fall? 0 - No No -  Risk for fall due to : No Fall Risks - History of fall(s) Impaired balance/gait -  Risk for fall due to: Comment - - - - -  Follow up Falls evaluation completed;Falls prevention discussed       Sees Dr Josph Macho psychiatrist at [place]. Continues depakote and fluoxetine '20mg'$  and zyprexa 5/'10mg'$  daily.hydroxyzine PRN.   BP elevated today. Doesn't check BP at home.   Preventative: Colon cancer screening - discussed, would like iFOB- provided last week and returned today. Prostate cancer screening - would like tocontinue screening. BPH on DRE 12/2019. Nocturia x1-2, on flomax.  OsteoporosisDEXA -2.8 lumbar (03/2015)- fosamax started 03/2017.  Osteopenia DEXA -2.1 spine, -1.9 hip (04/2019)  Advised to let us know if jaw or hip pain due to possible atypical fractures.  Flu  shot yearly at pharmacy Prevnar 02/2017, pneumovax 02/2018 Tdap 10/2013  COVID vaccine - completed [place] series 11/2019 zostavax - 07/2014 Shingrix - discussed- declines  Advanced directive:Scanned into chart 02/2017. Mother Adonis Huguenin then [employer]. Seat belt use discussed. Sunscreen use discussed. No changing moles on skin. Ex smoker - quit ~2009  Alcohol - 2-3 glasses wine daily - advised cutting down on this. Strong fmhx alcoholism. Considering AA.  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 Interim medical history since our last visit reviewed. Allergies and medications reviewed and updated. Outpatient Medications Prior to Visit  Medication Sig Dispense Refill  . acetaminophen (TYLENOL) 500 MG tablet Take 1,000 mg by mouth every 6 (six) hours as needed for moderate pain or headache.    Marland Kitchen aspirin 81 MG tablet Take 81 mg by mouth daily.    . benztropine (COGENTIN)  0.5 MG tablet Take 0.5 mg by mouth at bedtime.    . Calcium Citrate 333 MG TABS Take 2 tablets by mouth daily.    . Cholecalciferol (VITAMIN D) 2000 units CAPS Take 1 capsule by mouth daily.    . divalproex (DEPAKOTE ER) 250 MG 24 hr tablet     . FLUoxetine (PROZAC) 20 MG capsule     . hydrocortisone (ANUSOL-HC) 25 MG suppository Place 1 suppository (25 mg total) rectally 2 (two) times daily. 12 suppository 0  . hydrOXYzine (ATARAX/VISTARIL) 25 MG tablet As needed    . neomycin-polymyxin-pramoxine (NEOSPORIN PLUS) 1 % cream Apply 1 application topically 2 (two)  times daily as needed (minor cuts/wounds).    . OLANZapine (ZYPREXA) 10 MG tablet Take 1 tablet (10 mg total) by mouth at bedtime.    Marland Kitchen OLANZapine zydis (ZYPREXA) 5 MG disintegrating tablet Take 5 mg by mouth daily. Takes in mornings    . alendronate (FOSAMAX) 70 MG tablet TAKE 1 TABLET BY MOUTH EVERY 7 DAYS WITH A FULL GLASS OF WATER ON AN EMPTY STOMACH 12 tablet 3  . Melatonin 5 MG TABS Take 1-5 mg by mouth at bedtime as needed.     . tamsulosin (FLOMAX) 0.4 MG CAPS capsule TAKE 1 CAPSULE BY MOUTH ONCE DAILY 90 capsule 1   No facility-administered medications prior to visit.     Per HPI unless specifically indicated in ROS section below Review of Systems  Constitutional: Negative for activity change, appetite change, chills, fatigue, fever and unexpected weight change.  HENT: Negative for hearing loss.   Eyes: Negative for visual disturbance.  Respiratory: Positive for cough (attributes to dry throat) and shortness of breath. Negative for chest tightness and wheezing.   Cardiovascular: Negative for chest pain, palpitations and leg swelling.  Gastrointestinal: Positive for diarrhea. Negative for abdominal distention, abdominal pain, blood in stool, constipation, nausea and vomiting.  Genitourinary: Negative for difficulty urinating and hematuria.  Musculoskeletal: Negative for arthralgias, myalgias and neck pain.  Skin: Negative for rash.  Neurological: Negative for dizziness, seizures, syncope and headaches.  Hematological: Negative for adenopathy. Does not bruise/bleed easily.  Psychiatric/Behavioral: Negative for dysphoric mood. The patient is nervous/anxious.    Objective:  BP (!) 150/90 (BP Location: Right Arm, Patient Position: Sitting, Cuff Size: Normal)   Pulse 61   Temp 97.7 F (36.5 C) (Temporal)   Ht '5\' 9"'$  (1.753 m)   Wt 196 lb 3 oz (89 kg)   SpO2 97%   BMI 28.97 kg/m   Wt Readings from Last 3 Encounters:  04/28/20 196 lb 3 oz (89 kg)  11/19/19 204 lb 9 oz (92.8 kg)    10/30/19 203 lb 4 oz (92.2 kg)      Physical Exam Vitals and nursing note reviewed.  Constitutional:      General: He is not in acute distress.    Appearance: Normal appearance. He is well-developed. He is not ill-appearing.  HENT:     Head: Normocephalic and atraumatic.     Right Ear: Hearing, tympanic membrane, ear canal and external ear normal.     Left Ear: Hearing, tympanic membrane, ear canal and external ear normal.  Eyes:     General: No scleral icterus.    Extraocular Movements: Extraocular movements intact.     Conjunctiva/sclera: Conjunctivae normal.     Pupils: Pupils are equal, round, and reactive to light.  Neck:     Thyroid: No thyroid mass, thyromegaly or thyroid tenderness.     Vascular: No  carotid bruit.  Cardiovascular:     Rate and Rhythm: Normal rate and regular rhythm.     Pulses: Normal pulses.          Radial pulses are 2+ on the right side and 2+ on the left side.     Heart sounds: Normal heart sounds. No murmur heard.   Pulmonary:     Effort: Pulmonary effort is normal. No respiratory distress.     Breath sounds: Normal breath sounds. No wheezing, rhonchi or rales.  Abdominal:     General: Abdomen is flat. Bowel sounds are normal. There is no distension.     Palpations: Abdomen is soft. There is no mass.     Tenderness: There is no abdominal tenderness. There is no guarding or rebound.     Hernia: No hernia is present.  Musculoskeletal:        General: Normal range of motion.     Cervical back: Normal range of motion and neck supple.     Right lower leg: No edema.     Left lower leg: No edema.  Lymphadenopathy:     Cervical: No cervical adenopathy.  Skin:    General: Skin is warm and dry.     Findings: No rash.  Neurological:     General: No focal deficit present.     Mental Status: He is alert and oriented to person, place, and time.     Comments: CN grossly intact, station and gait intact  Psychiatric:        Mood and Affect: Mood normal.         Behavior: Behavior normal.        Thought Content: Thought content normal.        Judgment: Judgment normal.       Results for orders placed or performed in visit on 04/21/20  CBC with Differential/Platelet  Result Value Ref Range   WBC 3.3 (L) 4.0 - 10.5 K/uL   RBC 4.49 4.22 - 5.81 Mil/uL   Hemoglobin 14.1 13.0 - 17.0 g/dL   HCT 40.8 39 - 52 %   MCV 90.9 78.0 - 100.0 fl   MCHC 34.6 30.0 - 36.0 g/dL   RDW 14.4 11.5 - 15.5 %   Platelets 158.0 150 - 400 K/uL   Neutrophils Relative % 54.1 43 - 77 %   Lymphocytes Relative 29.3 12 - 46 %   Monocytes Relative 11.7 3 - 12 %   Eosinophils Relative 3.6 0 - 5 %   Basophils Relative 1.3 0 - 3 %   Neutro Abs 1.8 1.4 - 7.7 K/uL   Lymphs Abs 1.0 0.7 - 4.0 K/uL   Monocytes Absolute 0.4 0 - 1 K/uL   Eosinophils Absolute 0.1 0 - 0 K/uL   Basophils Absolute 0.0 0 - 0 K/uL  VITAMIN D 25 Hydroxy (Vit-D Deficiency, Fractures)  Result Value Ref Range   VITD 51.11 30.00 - 100.00 ng/mL  PSA  Result Value Ref Range   PSA 0.86 0.10 - 4.00 ng/mL  Lipid panel  Result Value Ref Range   Cholesterol 165 0 - 200 mg/dL   Triglycerides 99.0 0 - 149 mg/dL   HDL 66.20 >39.00 mg/dL   VLDL 19.8 0.0 - 40.0 mg/dL   LDL Cholesterol 79 0 - 99 mg/dL   Total CHOL/HDL Ratio 2    NonHDL 99.14   Comprehensive metabolic panel  Result Value Ref Range   Sodium 136 135 - 145 mEq/L   Potassium 4.5 3.5 -  5.1 mEq/L   Chloride 101 96 - 112 mEq/L   CO2 28 19 - 32 mEq/L   Glucose, Bld 93 70 - 99 mg/dL   BUN 7 6 - 23 mg/dL   Creatinine, Ser 0.84 0.40 - 1.50 mg/dL   Total Bilirubin 1.0 0.2 - 1.2 mg/dL   Alkaline Phosphatase 47 39 - 117 U/L   AST 9 0 - 37 U/L   ALT 12 0 - 53 U/L   Total Protein 6.5 6.0 - 8.3 g/dL   Albumin 4.4 3.5 - 5.2 g/dL   GFR 90.83 >60.00 mL/min   Calcium 9.5 8.4 - 10.5 mg/dL   Assessment & Plan:  This visit occurred during the SARS-CoV-2 public health emergency.  Safety protocols were in place, including screening questions prior to  the visit, additional usage of staff PPE, and extensive cleaning of exam room while observing appropriate contact time as indicated for disinfecting solutions.   Problem List Items Addressed This Visit    Schizoaffective disorder [facility])    Appreciate psych care.       Osteopenia    Reviewed DEXA from last year.  Continue fosamax, reviewed correct adminstration of drug.  Continue regular weight bearing exercise and calcium and vitamin D daily.       Health maintenance examination - Primary    Preventative protocols reviewed and updated unless pt declined. Discussed healthy diet and lifestyle.       Habitual alcohol use    Has increased wine intake - advised cut down on alcohol, reviewed long term health risks of habitual alcohol use.       Elevated blood pressure reading in office without diagnosis of hypertension    BP elevated today - advised start monitoring pressures at home, let us know if consistently >140/90 to start antihypertensive. Reviewed dietary choices to improve BP control. I did ask him to return in 1-2 months for HTN recheck.       Bipolar 1 disorder ([place])    Appreciate psych care  Stable period on depakote and zyprexa      Benign prostatic hyperplasia    Stable period on flomax. Continue.       Relevant Medications   tamsulosin (FLOMAX) 0.4 MG CAPS capsule       Meds ordered this encounter  Medications  . alendronate (FOSAMAX) 70 MG tablet    Sig: Take 1 tablet (70 mg total) by mouth once a week. Take with a full glass of water on an empty stomach.    Dispense:  12 tablet    Refill:  3  . tamsulosin (FLOMAX) 0.4 MG CAPS capsule    Sig: Take 1 capsule (0.4 mg total) by mouth daily.    Dispense:  90 capsule    Refill:  3   No orders of the defined types were placed in this encounter.   Patient instructions: Pass by lab to pick up stool kit.  I'm glad you got your covid vaccine.  Consider new shingles shot.   Cut down on alcohol.  Blood pressure  is staying high. Start monitoring at home, let us know readings. Work on backing off alcohol, increase water intake, avoid caffeinated beverages, limit salt in the diet, work on aerobic exercise.  Return in 1-2 months for BP recheck.   Follow up plan: Return in about 1 year (around 04/28/2021) for annual exam, prior fasting for blood work, medicare wellness visit.  [name], MD

## 2020-04-28 NOTE — Assessment & Plan Note (Signed)
Appreciate psych care.  ?

## 2020-04-28 NOTE — Assessment & Plan Note (Addendum)
Appreciate psych care  Stable period on depakote and zyprexa

## 2020-04-28 NOTE — Assessment & Plan Note (Signed)
Preventative protocols reviewed and updated unless pt declined. Discussed healthy diet and lifestyle.  

## 2020-04-28 NOTE — Assessment & Plan Note (Signed)
Reviewed DEXA from last year.  Continue fosamax, reviewed correct adminstration of drug.  Continue regular weight bearing exercise and calcium and vitamin D daily.

## 2020-04-29 ENCOUNTER — Other Ambulatory Visit: Payer: Self-pay | Admitting: Family Medicine

## 2020-04-29 ENCOUNTER — Other Ambulatory Visit (INDEPENDENT_AMBULATORY_CARE_PROVIDER_SITE_OTHER): Payer: Medicare Other

## 2020-04-29 DIAGNOSIS — Z1211 Encounter for screening for malignant neoplasm of colon: Secondary | ICD-10-CM

## 2020-04-29 LAB — FECAL OCCULT BLOOD, GUAIAC: Fecal Occult Blood: NEGATIVE

## 2020-04-29 LAB — FECAL OCCULT BLOOD, IMMUNOCHEMICAL: Fecal Occult Bld: NEGATIVE

## 2020-05-04 ENCOUNTER — Encounter: Payer: Self-pay | Admitting: Family Medicine

## 2020-05-04 NOTE — Assessment & Plan Note (Addendum)
BP elevated today - advised start monitoring pressures at home, let us know if consistently >140/90 to start antihypertensive. Reviewed dietary choices to improve BP control. I did ask him to return in 1-2 months for HTN recheck.

## 2020-06-02 ENCOUNTER — Ambulatory Visit (INDEPENDENT_AMBULATORY_CARE_PROVIDER_SITE_OTHER): Payer: Medicare Other | Admitting: Family Medicine

## 2020-06-02 ENCOUNTER — Encounter: Payer: Self-pay | Admitting: Family Medicine

## 2020-06-02 ENCOUNTER — Other Ambulatory Visit: Payer: Self-pay

## 2020-06-02 VITALS — BP 148/100 | HR 63 | Temp 98.0°F | Ht 69.0 in | Wt 194.2 lb

## 2020-06-02 DIAGNOSIS — I10 Essential (primary) hypertension: Secondary | ICD-10-CM

## 2020-06-02 LAB — MICROALBUMIN / CREATININE URINE RATIO
Creatinine,U: 22.2 mg/dL
Microalb Creat Ratio: 3.2 mg/g (ref 0.0–30.0)
Microalb, Ur: <0.7 mg/dL (ref 0.0–1.9)

## 2020-06-02 MED ORDER — AMLODIPINE BESYLATE 2.5 MG PO TABS: 2.5000 mg | ORAL_TABLET | Freq: Every day | ORAL | 6 refills | Status: DC

## 2020-06-02 NOTE — Assessment & Plan Note (Addendum)
Persistent elevation noted.  Start amlodipine 2.5mg  daily (low dose due to fluctuations noted during home monitoring).  Check Umicroalb and EKG today.  RTC 4-6 mo f/u visit, sooner if BP persistently >140/90.  Pt agrees with plan.

## 2020-06-02 NOTE — Patient Instructions (Addendum)
Blood pressure is staying a bit elevated today.  Check EKG today Urine test today  Start amlodipine 2.5mg  daily for blood pressure control.  Keep an eye on blood pressures at home - check about once a day if you can, make sure to check when in a resting state.  Let me know how blood pressures are running. Return in 4-6 months if doing well, sooner if blood pressure consistently staying >140/90.

## 2020-06-02 NOTE — Progress Notes (Signed)
This visit was conducted in person.  BP (!) 148/100 (Cuff Size: Normal)   Pulse 63   Temp 98 F (36.7 C) (Temporal)   Ht 5\' 9"  (1.753 m)   Wt 194 lb 3 oz (88.1 kg)   SpO2 97%   BMI 28.68 kg/m   BP Readings from Last 3 Encounters:  06/02/20 (!) 148/100  04/28/20 (!) 150/90  11/19/19 132/80    CC: elevated blood pressure readings  Subjective:    Patient ID: Robert Fowler, male    DOB: [birth year], 70 y.o.   MRN: [id number]  HPI: Robert Fowler is a 69 y.o. male presenting on 06/02/2020 for Elevated Blood Pressure (C/o elevated home BP readings.  Has decreased salt in diet. )   See prior note for details. Incidentally found to have elevated blood pressure readings during recent physical. He dropped salt in diet. He started checking at home using automatic arm cuff: 150/80s. However has gotten reading as low as 90/60. Limited aerobic exercise.  Denies HA, vision changes, CP/tightness, SOB, leg swelling.   Lab Results  Component Value Date   TSH 1.29 03/15/2019        Relevant past medical, surgical, family and social history reviewed and updated as indicated. Interim medical history since our last visit reviewed. Allergies and medications reviewed and updated. Outpatient Medications Prior to Visit  Medication Sig Dispense Refill  . acetaminophen (TYLENOL) 500 MG tablet Take 1,000 mg by mouth every 6 (six) hours as needed for moderate pain or headache.    . alendronate (FOSAMAX) 70 MG tablet Take 1 tablet (70 mg total) by mouth once a week. Take with a full glass of water on an empty stomach. 12 tablet 3  . aspirin 81 MG tablet Take 81 mg by mouth daily.    . benztropine (COGENTIN) 0.5 MG tablet Take 0.5 mg by mouth at bedtime.    . Calcium Citrate 333 MG TABS Take 2 tablets by mouth daily.    . Cholecalciferol (VITAMIN D) 2000 units CAPS Take 1 capsule by mouth daily.    . divalproex (DEPAKOTE ER) 250 MG 24 hr tablet     . FLUoxetine (PROZAC) 20 MG capsule     . hydrocortisone  (ANUSOL-HC) 25 MG suppository Place 1 suppository (25 mg total) rectally 2 (two) times daily. 12 suppository 0  . hydrOXYzine (ATARAX/VISTARIL) 25 MG tablet As needed    . neomycin-polymyxin-pramoxine (NEOSPORIN PLUS) 1 % cream Apply 1 application topically 2 (two) times daily as needed (minor cuts/wounds).    . OLANZapine (ZYPREXA) 10 MG tablet Take 1 tablet (10 mg total) by mouth at bedtime.    Marland Kitchen OLANZapine zydis (ZYPREXA) 5 MG disintegrating tablet Take 5 mg by mouth daily. Takes in mornings    . tamsulosin (FLOMAX) 0.4 MG CAPS capsule Take 1 capsule (0.4 mg total) by mouth daily. 90 capsule 3   No facility-administered medications prior to visit.     Per HPI unless specifically indicated in ROS section below Review of Systems Objective:  BP (!) 148/100 (Cuff Size: Normal)   Pulse 63   Temp 98 F (36.7 C) (Temporal)   Ht 5\' 9"  (1.753 m)   Wt 194 lb 3 oz (88.1 kg)   SpO2 97%   BMI 28.68 kg/m   Wt Readings from Last 3 Encounters:  06/02/20 194 lb 3 oz (88.1 kg)  04/28/20 196 lb 3 oz (89 kg)  11/19/19 204 lb 9 oz (92.8 kg)      Physical Exam  Vitals and nursing note reviewed.  Constitutional:      Appearance: Normal appearance. He is not ill-appearing.  Cardiovascular:     Rate and Rhythm: Normal rate and regular rhythm.     Pulses: Normal pulses.     Heart sounds: Normal heart sounds. No murmur heard.   Pulmonary:     Effort: Pulmonary effort is normal. No respiratory distress.     Breath sounds: Normal breath sounds. No wheezing, rhonchi or rales.  Musculoskeletal:     Right lower leg: No edema.     Left lower leg: No edema.  Neurological:     Mental Status: He is alert.  Psychiatric:        Mood and Affect: Mood normal.        Behavior: Behavior normal.       Lab Results  Component Value Date   CREATININE 0.84 04/21/2020   BUN 7 04/21/2020   NA 136 04/21/2020   K 4.5 04/21/2020   CL 101 04/21/2020   CO2 28 04/21/2020    EKG - sinus bradycardia 50s, normal  axis, intervals, no acute ST/T changes Assessment & Plan:  This visit occurred during the SARS-CoV-2 public health emergency.  Safety protocols were in place, including screening questions prior to the visit, additional usage of staff PPE, and extensive cleaning of exam room while observing appropriate contact time as indicated for disinfecting solutions.   Problem List Items Addressed This Visit    Essential hypertension - Primary    Persistent elevation noted.  Start amlodipine 2.5mg  daily (low dose due to fluctuations noted during home monitoring).  Check Umicroalb and EKG today.  RTC 4-6 mo f/u visit, sooner if BP persistently >140/90.  Pt agrees with plan.       Relevant Medications   amLODipine (NORVASC) 2.5 MG tablet   Other Relevant Orders   Microalbumin / creatinine urine ratio   EKG 12-Lead (Completed)       Meds ordered this encounter  Medications  . amLODipine (NORVASC) 2.5 MG tablet    Sig: Take 1 tablet (2.5 mg total) by mouth daily.    Dispense:  30 tablet    Refill:  6   Orders Placed This Encounter  Procedures  . Microalbumin / creatinine urine ratio  . EKG 12-Lead    Patient Instructions  Blood pressure is staying a bit elevated today.  Check EKG today Urine test today  Start amlodipine 2.5mg  daily for blood pressure control.  Keep an eye on blood pressures at home - check about once a day if you can, make sure to check when in a resting state.  Let me know how blood pressures are running. Return in 4-6 months if doing well, sooner if blood pressure consistently staying >140/90.    Follow up plan: Return in about 6 months (around 11/30/2020), or if symptoms worsen or fail to improve, for follow up visit.  [name], MD

## 2020-06-17 DIAGNOSIS — F250 Schizoaffective disorder, bipolar type: Secondary | ICD-10-CM | POA: Diagnosis not present

## 2020-07-22 DIAGNOSIS — H5213 Myopia, bilateral: Secondary | ICD-10-CM | POA: Diagnosis not present

## 2020-09-09 DIAGNOSIS — F250 Schizoaffective disorder, bipolar type: Secondary | ICD-10-CM | POA: Diagnosis not present

## 2020-12-01 ENCOUNTER — Other Ambulatory Visit: Payer: Self-pay

## 2020-12-01 ENCOUNTER — Ambulatory Visit (INDEPENDENT_AMBULATORY_CARE_PROVIDER_SITE_OTHER): Payer: Medicare Other | Admitting: Family Medicine

## 2020-12-01 ENCOUNTER — Encounter: Payer: Self-pay | Admitting: Family Medicine

## 2020-12-01 VITALS — BP 146/80 | HR 66 | Temp 97.6°F | Ht 69.0 in | Wt 193.5 lb

## 2020-12-01 DIAGNOSIS — I10 Essential (primary) hypertension: Secondary | ICD-10-CM

## 2020-12-01 DIAGNOSIS — Z23 Encounter for immunization: Secondary | ICD-10-CM | POA: Diagnosis not present

## 2020-12-01 MED ORDER — AMLODIPINE BESYLATE 2.5 MG PO TABS: 2.5000 mg | ORAL_TABLET | Freq: Every day | ORAL | 1 refills | Status: DC

## 2020-12-01 NOTE — Progress Notes (Signed)
Patient ID: Robert Fowler, male    DOB: 08/04/1951, 70 y.o.   MRN: [id number]  This visit was conducted in person.  BP (!) 146/80 (BP Location: Right Arm, Cuff Size: Large)   Pulse 66   Temp 97.6 F (36.4 C) (Temporal)   Ht 5\' 9"  (1.753 m)   Wt 193 lb 8 oz (87.8 kg)   SpO2 96%   BMI 28.57 kg/m    CC: 6 mo f/u visit  Subjective:   HPI: Robert Fowler is a 70 y.o. male presenting on 12/01/2020 for Hypertension (Her for 6 mo f/u.)   HTN - Compliant with current antihypertensive regimen of amlodipine 2.5mg  daily. Does not check blood pressures at home. No low blood pressure readings or symptoms of dizziness/syncope. Denies HA, CP/tightness, SOB, leg swelling. Notes occasional blurry vision - last saw eye doctor fall 2021 [facility]).   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 She needs assistance wit hADLs and has had some falls recently.      Relevant past medical, surgical, family and social history reviewed and updated as indicated. Interim medical history since our last visit reviewed. Allergies and medications reviewed and updated. Outpatient Medications Prior to Visit  Medication Sig Dispense Refill  . acetaminophen (TYLENOL) 500 MG tablet Take 1,000 mg by mouth every 6 (six) hours as needed for moderate pain or headache.    . alendronate (FOSAMAX) 70 MG tablet Take 1 tablet (70 mg total) by mouth once a week. Take with a full glass of water on an empty stomach. 12 tablet 3  . aspirin 81 MG tablet Take 81 mg by mouth daily.    . benztropine (COGENTIN) 0.5 MG tablet Take 0.5 mg by mouth at bedtime.    . Calcium Citrate 333 MG TABS Take 2 tablets by mouth daily.    . Cholecalciferol (VITAMIN D) 2000 units CAPS Take 1 capsule by mouth daily.    . divalproex (DEPAKOTE ER) 250 MG 24 hr tablet     . FLUoxetine (PROZAC) 20 MG capsule     . hydrocortisone (ANUSOL-HC) 25 MG suppository Place 1 suppository (25 mg total) rectally 2 (two) times daily. 12 suppository 0  . hydrOXYzine  (ATARAX/VISTARIL) 25 MG tablet As needed    . neomycin-polymyxin-pramoxine (NEOSPORIN PLUS) 1 % cream Apply 1 application topically 2 (two) times daily as needed (minor cuts/wounds).    . OLANZapine (ZYPREXA) 10 MG tablet Take 1 tablet (10 mg total) by mouth at bedtime.    Marland Kitchen OLANZapine zydis (ZYPREXA) 5 MG disintegrating tablet Take 5 mg by mouth daily. Takes in mornings    . tamsulosin (FLOMAX) 0.4 MG CAPS capsule Take 1 capsule (0.4 mg total) by mouth daily. 90 capsule 3  . amLODipine (NORVASC) 2.5 MG tablet Take 1 tablet (2.5 mg total) by mouth daily. 30 tablet 6   No facility-administered medications prior to visit.     Per HPI unless specifically indicated in ROS section below Review of Systems Objective:  BP (!) 146/80 (BP Location: Right Arm, Cuff Size: Large)   Pulse 66   Temp 97.6 F (36.4 C) (Temporal)   Ht 5\' 9"  (1.753 m)   Wt 193 lb 8 oz (87.8 kg)   SpO2 96%   BMI 28.57 kg/m   Wt Readings from Last 3 Encounters:  12/01/20 193 lb 8 oz (87.8 kg)  06/02/20 194 lb 3 oz (88.1 kg)  04/28/20 196 lb 3 oz (89 kg)      Physical Exam Vitals  and nursing note reviewed.  Constitutional:      Appearance: Normal appearance. He is not ill-appearing.  Cardiovascular:     Rate and Rhythm: Normal rate and regular rhythm.     Pulses: Normal pulses.     Heart sounds: Normal heart sounds. No murmur heard.   Pulmonary:     Effort: Pulmonary effort is normal. No respiratory distress.     Breath sounds: Normal breath sounds. No wheezing, rhonchi or rales.  Musculoskeletal:     Right lower leg: No edema.     Left lower leg: No edema.  Skin:    General: Skin is warm and dry.     Findings: No rash.  Neurological:     Mental Status: He is alert.  Psychiatric:        Mood and Affect: Mood normal.        Behavior: Behavior normal.       Results for orders placed or performed in visit on 06/02/20  Microalbumin / creatinine urine ratio  Result Value Ref Range   Microalb, Ur <0.7  0.0 - 1.9 mg/dL   Creatinine,U 22.2 mg/dL   Microalb Creat Ratio 3.2 0.0 - 30.0 mg/g   Assessment & Plan:  This visit occurred during the SARS-CoV-2 public health emergency.  Safety protocols were in place, including screening questions prior to the visit, additional usage of staff PPE, and extensive cleaning of exam room while observing appropriate contact time as indicated for disinfecting solutions.   Problem List Items Addressed This Visit    Essential hypertension - Primary    Chronic, anticipate improved control although difficult to tell as he hasn't been checking BP readings at home. Will continue current regimen, but he will buy electric cuff to start monitoring at home and let ee know if consistently <140/90.       Relevant Medications   amLODipine (NORVASC) 2.5 MG tablet    Other Visit Diagnoses    Need for influenza vaccination       Relevant Orders   Flu Vaccine QUAD High Dose(Fluad) (Completed)       Meds ordered this encounter  Medications  . amLODipine (NORVASC) 2.5 MG tablet    Sig: Take 1 tablet (2.5 mg total) by mouth daily.    Dispense:  90 tablet    Refill:  1   Orders Placed This Encounter  Procedures  . Flu Vaccine QUAD High Dose(Fluad)    Patient Instructions  Start checking blood pressures at home, send me some readings in 2-3 weeks.  Goal blood pressure is <140/90. No changes today - continue amlodipine 2.5mg  daily.  Return in 5 months for physical/wellness visit.    Follow up plan: Return in about 5 months (around 05/03/2021), or if symptoms worsen or fail to improve, for medicare wellness visit, annual exam, prior fasting for blood work.  [name], MD

## 2020-12-01 NOTE — Assessment & Plan Note (Signed)
Chronic, anticipate improved control although difficult to tell as he hasn't been checking BP readings at home. Will continue current regimen, but he will buy electric cuff to start monitoring at home and let ee know if consistently <140/90.

## 2020-12-01 NOTE — Patient Instructions (Addendum)
Start checking blood pressures at home, send me some readings in 2-3 weeks.  Goal blood pressure is <140/90. No changes today - continue amlodipine 2.5mg  daily.  Return in 5 months for physical/wellness visit.

## 2020-12-03 DIAGNOSIS — F419 Anxiety disorder, unspecified: Secondary | ICD-10-CM | POA: Diagnosis not present

## 2020-12-03 DIAGNOSIS — F250 Schizoaffective disorder, bipolar type: Secondary | ICD-10-CM | POA: Diagnosis not present

## 2021-02-26 DIAGNOSIS — F250 Schizoaffective disorder, bipolar type: Secondary | ICD-10-CM | POA: Diagnosis not present

## 2021-02-26 DIAGNOSIS — F419 Anxiety disorder, unspecified: Secondary | ICD-10-CM | POA: Diagnosis not present

## 2021-04-22 ENCOUNTER — Ambulatory Visit: Payer: Medicare Other

## 2021-04-25 ENCOUNTER — Other Ambulatory Visit: Payer: Self-pay | Admitting: Family Medicine

## 2021-04-25 DIAGNOSIS — F319 Bipolar disorder, unspecified: Secondary | ICD-10-CM

## 2021-04-25 DIAGNOSIS — I10 Essential (primary) hypertension: Secondary | ICD-10-CM

## 2021-04-25 DIAGNOSIS — Z125 Encounter for screening for malignant neoplasm of prostate: Secondary | ICD-10-CM

## 2021-04-28 ENCOUNTER — Ambulatory Visit (INDEPENDENT_AMBULATORY_CARE_PROVIDER_SITE_OTHER): Payer: Medicare Other

## 2021-04-28 ENCOUNTER — Other Ambulatory Visit (INDEPENDENT_AMBULATORY_CARE_PROVIDER_SITE_OTHER): Payer: Medicare Other

## 2021-04-28 ENCOUNTER — Other Ambulatory Visit: Payer: Self-pay

## 2021-04-28 DIAGNOSIS — Z125 Encounter for screening for malignant neoplasm of prostate: Secondary | ICD-10-CM

## 2021-04-28 DIAGNOSIS — Z0000 Encounter for general adult medical examination without abnormal findings: Secondary | ICD-10-CM

## 2021-04-28 DIAGNOSIS — F319 Bipolar disorder, unspecified: Secondary | ICD-10-CM | POA: Diagnosis not present

## 2021-04-28 DIAGNOSIS — I10 Essential (primary) hypertension: Secondary | ICD-10-CM

## 2021-04-28 LAB — COMPREHENSIVE METABOLIC PANEL
ALT: 14 U/L (ref 0–53)
AST: 10 U/L (ref 0–37)
Albumin: 4.4 g/dL (ref 3.5–5.2)
Alkaline Phosphatase: 56 U/L (ref 39–117)
BUN: 7 mg/dL (ref 6–23)
CO2: 26 mEq/L (ref 19–32)
Calcium: 9.5 mg/dL (ref 8.4–10.5)
Chloride: 98 mEq/L (ref 96–112)
Creatinine, Ser: 0.87 mg/dL (ref 0.40–1.50)
GFR: 88.26 mL/min (ref >60.00)
Glucose, Bld: 95 mg/dL (ref 70–99)
Potassium: 4.2 mEq/L (ref 3.5–5.1)
Sodium: 134 mEq/L — ABNORMAL LOW (ref 135–145)
Total Bilirubin: 1.2 mg/dL (ref 0.2–1.2)
Total Protein: 6.8 g/dL (ref 6.0–8.3)

## 2021-04-28 LAB — PSA: PSA: 1.71 ng/mL (ref 0.10–4.00)

## 2021-04-28 LAB — CBC WITH DIFFERENTIAL/PLATELET
Basophils Absolute: 0 10*3/uL (ref 0.0–0.1)
Basophils Relative: 0.9 % (ref 0.0–3.0)
Eosinophils Absolute: 0.1 10*3/uL (ref 0.0–0.7)
Eosinophils Relative: 2.2 % (ref 0.0–5.0)
HCT: 41.9 % (ref 39.0–52.0)
Hemoglobin: 14.5 g/dL (ref 13.0–17.0)
Lymphocytes Relative: 22.8 % (ref 12.0–46.0)
Lymphs Abs: 1 10*3/uL (ref 0.7–4.0)
MCHC: 34.7 g/dL (ref 30.0–36.0)
MCV: 90.1 fl (ref 78.0–100.0)
Monocytes Absolute: 0.5 10*3/uL (ref 0.1–1.0)
Monocytes Relative: 10.5 % (ref 3.0–12.0)
Neutro Abs: 2.9 10*3/uL (ref 1.4–7.7)
Neutrophils Relative %: 63.6 % (ref 43.0–77.0)
Platelets: 162 10*3/uL (ref 150.0–400.0)
RBC: 4.65 Mil/uL (ref 4.22–5.81)
RDW: 13.8 % (ref 11.5–15.5)
WBC: 4.5 10*3/uL (ref 4.0–10.5)

## 2021-04-28 LAB — MICROALBUMIN / CREATININE URINE RATIO
Creatinine,U: 31.8 mg/dL
Microalb Creat Ratio: 2.2 mg/g (ref 0.0–30.0)
Microalb, Ur: <0.7 mg/dL (ref 0.0–1.9)

## 2021-04-28 NOTE — Progress Notes (Signed)
PCP notes:  Health Maintenance: Shingrix- due    Abnormal Screenings: none   Patient concerns: none   Nurse concerns: none   Next PCP appt.: 05/07/2021 @ 2:30 pm

## 2021-04-28 NOTE — Progress Notes (Signed)
Subjective:   Robert Fowler is a 70 y.o. male who presents for Medicare Annual/Subsequent preventive examination.  Review of Systems: N/A      I connected with the patient today by telephone and verified that I am speaking with the correct person using two identifiers. Location patient: home Location nurse: work Persons participating in the telephone visit: patient, nurse.   I discussed the limitations, risks, security and privacy concerns of performing an evaluation and management service by telephone and the availability of in person appointments. I also discussed with the patient that there may be a patient responsible charge related to this service. The patient expressed understanding and verbally consented to this telephonic visit.       Cardiac Risk Factors include: advanced age (>36mn, >>48women);male gender;hypertension     Objective:    Today's Vitals   There is no height or weight on file to calculate BMI.  Advanced Directives 04/28/2021 04/21/2020 03/18/2019 04/13/2018 03/14/2018 04/11/2017 03/08/2017  Does Patient Have a Medical Advance Directive? Yes Yes Yes Yes Yes No Yes  Type of AParamedicof AFairburyLiving will HVeguitaLiving will HScissorsLiving will H[place]Living will H[place]Living will - HLydiaLiving will  Does patient want to make changes to medical advance directive? - - No - Patient declined No - Patient declined - - -  Copy of HAdamsin Chart? Yes - validated most recent copy scanned in chart (See row information) Yes - validated most recent copy scanned in chart (See row information) No - copy requested No - copy requested No - copy requested - No - copy requested  Would patient like information on creating a medical advance directive? - - - - - No - Patient declined -  Some encounter information is confidential and  restricted. Go to Review Flowsheets activity to see all data.    Current Medications (verified) Outpatient Encounter Medications as of 04/28/2021  Medication Sig   acetaminophen (TYLENOL) 500 MG tablet Take 1,000 mg by mouth every 6 (six) hours as needed for moderate pain or headache.   alendronate (FOSAMAX) 70 MG tablet Take 1 tablet (70 mg total) by mouth once a week. Take with a full glass of water on an empty stomach.   amLODipine (NORVASC) 2.5 MG tablet Take 1 tablet (2.5 mg total) by mouth daily.   aspirin 81 MG tablet Take 81 mg by mouth daily.   benztropine (COGENTIN) 0.5 MG tablet Take 0.5 mg by mouth at bedtime.   Calcium Citrate 333 MG TABS Take 2 tablets by mouth daily.   Cholecalciferol (VITAMIN D) 2000 units CAPS Take 1 capsule by mouth daily.   divalproex (DEPAKOTE ER) 250 MG 24 hr tablet    FLUoxetine (PROZAC) 20 MG capsule    hydrocortisone (ANUSOL-HC) 25 MG suppository Place 1 suppository (25 mg total) rectally 2 (two) times daily.   hydrOXYzine (ATARAX/VISTARIL) 25 MG tablet As needed   neomycin-polymyxin-pramoxine (NEOSPORIN PLUS) 1 % cream Apply 1 application topically 2 (two) times daily as needed (minor cuts/wounds).   OLANZapine (ZYPREXA) 10 MG tablet Take 1 tablet (10 mg total) by mouth at bedtime.   OLANZapine zydis (ZYPREXA) 5 MG disintegrating tablet Take 5 mg by mouth daily. Takes in mornings   tamsulosin (FLOMAX) 0.4 MG CAPS capsule Take 1 capsule (0.4 mg total) by mouth daily.   No facility-administered encounter medications on file as of 04/28/2021.  Allergies (verified) Poison oak extract   History: Past Medical History:  Diagnosis Date   Arthritis    BENIGN PROSTATIC HYPERTROPHY 07/18/2007   Bipolar disorder ([place])    Habitual alcohol use    Lesion of oral mucosa 03/21/2018   Biopsy 03/2018 Tami Ribas) - INFLAMMATORY FIBROUS HYPERPLASIA CONSISTENT WITH DENTURE INJURY (EPULIS FISSURATUM).    OSTEOPOROSIS 09/04/2007   DEXA -2.8 lumbar (03/2015)   Pes  planus    Prediabetes 2014   Schizoaffective disorder ([place])    h/o paranoid schizophrenia   Wears dentures    full upper and lower   Past Surgical History:  Procedure Laterality Date   INGUINAL HERNIA REPAIR Right 08/2014   Dr Gershon Crane   MASS EXCISION N/A 04/13/2018   Procedure: EXCISION LOWER LIP ORAL  MASS;  Surgeon: Beverly Gust, MD;  Location: [facility];  Service: ENT;  Laterality: N/A;   Family History  Problem Relation Age of Onset   Alcohol abuse Father    Alcohol abuse Maternal Grandmother    COPD Mother        smoker   CAD Neg Hx    Stroke Neg Hx    Cancer Neg Hx    Diabetes Neg Hx    Hypertension Neg Hx    Social History   Socioeconomic History   Marital status: Single    Spouse name: Not on file   Number of children: Not on file   Years of education: Not on file   Highest education level: Not on file  Occupational History   Not on file  Tobacco Use   Smoking status: Former    Types: Cigarettes    Quit date: 09/26/2006    Years since quitting: 14.5   Smokeless tobacco: Never  Vaping Use   Vaping Use: Never used  Substance and Sexual Activity   Alcohol use: Yes    Comment: occasional   Drug use: Yes    Types: Marijuana   Sexual activity: Not Currently  Other Topics Concern   Not on file  Social History Narrative   Lives alone.     Mother and brother live nearby.   Occupation: Retail buyer at [employer]   Edu: 12th grade   Activity: mows yard   Diet: good water, fruits/vegetables daily      Disability evaluation - no disability (04/2014)   Social Determinants of Health   Financial Resource Strain: Low Risk    Difficulty of Paying Living Expenses: Not hard at all  Food Insecurity: No Food Insecurity   Worried About Charity fundraiser in the Last Year: Never true   Ran Out of Food in the Last Year: Never true  Transportation Needs: No Transportation Needs   Lack of Transportation (Medical): No   Lack of Transportation (Non-Medical): No   Physical Activity: Inactive   Days of Exercise per Week: 0 days   Minutes of Exercise per Session: 0 min  Stress: No Stress Concern Present   Feeling of Stress : Not at all  Social Connections: Not on file    Tobacco Counseling Counseling given: Not Answered   Clinical Intake:  Pre-visit preparation completed: Yes  Pain : No/denies pain     Nutritional Risks: None Diabetes: No  How often do you need to have someone help you when you read instructions, pamphlets, or other written materials from your doctor or pharmacy?: 1 - Never  Diabetic: No Nutrition Risk Assessment:  Has the patient had any N/V/D within the last 2  months?  No  Does the patient have any non-healing wounds?  No  Has the patient had any unintentional weight loss or weight gain?  No   Diabetes:  Is the patient diabetic?  No  If diabetic, was a CBG obtained today?   N/A Did the patient bring in their glucometer from home?   N/A How often do you monitor your CBG's? N/A.   Financial Strains and Diabetes Management:  Are you having any financial strains with the device, your supplies or your medication?  N/A .  Does the patient want to be seen by Chronic Care Management for management of their diabetes?   N/A Would the patient like to be referred to a Nutritionist or for Diabetic Management?   N/A    Interpreter Needed?: No  Information entered by :: CJohnson, RN   Activities of Daily Living In your present state of health, do you have any difficulty performing the following activities: 04/28/2021  Hearing? N  Vision? N  Difficulty concentrating or making decisions? N  Walking or climbing stairs? N  Dressing or bathing? N  Doing errands, shopping? N  Preparing Food and eating ? N  Using the Toilet? N  In the past six months, have you accidently leaked urine? N  Do you have problems with loss of bowel control? N  Managing your Medications? N  Managing your Finances? N  Housekeeping or  managing your Housekeeping? N  Some recent data might be hidden    Patient Care Team: [name], MD as PCP - General (Family Medicine)  Indicate any recent Medical Services you may have received from other than [facility] providers in the past year (date may be approximate).     Assessment:   This is a routine wellness examination for Robert Fowler.  Hearing/Vision screen Vision Screening - Comments:: Patient gets annual eye exams   Dietary issues and exercise activities discussed: Current Exercise Habits: The patient does not participate in regular exercise at present, Exercise limited by: None identified   Goals Addressed             This Visit's Progress    Patient Stated       04/28/2021, I will maintain and continue medications as prescribed.       Depression Screen PHQ 2/9 Scores 04/28/2021 04/21/2020 03/18/2019 03/14/2018 03/13/2017 03/08/2017 03/07/2016  PHQ - 2 Score 0 0 0 0 0 0 0  PHQ- 9 Score 0 0 0 0 2 - -    Fall Risk Fall Risk  04/28/2021 04/21/2020 03/18/2019 03/14/2018 03/08/2017  Falls in the past year? 1 1 0 Yes Yes  Comment - tripped on carpet - per pt, multiple falls after behavioral health hospitalization pt states he was pulling weeds and fell backwards in yard; no injury, no medical treatment  Number falls in past yr: 0 0 - 2 or more 1  Injury with Fall? 0 0 - No No  Risk for fall due to : Medication side effect No Fall Risks - History of fall(s) Impaired balance/gait  Risk for fall due to: Comment - - - - -  Follow up Falls evaluation completed;Falls prevention discussed Falls evaluation completed;Falls prevention discussed - - -    FALL RISK PREVENTION PERTAINING TO THE HOME:  Any stairs in or around the home? Yes  If so, are there any without handrails? No  Home free of loose throw rugs in walkways, pet beds, electrical cords, etc? Yes  Adequate lighting in your home to reduce  risk of falls? Yes   ASSISTIVE DEVICES UTILIZED TO PREVENT FALLS:  Life alert? No   Use of a cane, walker or w/c? No  Grab bars in the bathroom? No  Shower chair or bench in shower? No  Elevated toilet seat or a handicapped toilet? No   TIMED UP AND GO:  Was the test performed?  N/A telephone visit .    Cognitive Function: MMSE - Mini Mental State Exam 04/28/2021 04/21/2020 03/18/2019 03/14/2018 03/08/2017  Orientation to time '5 5 5 5 5  '$ Orientation to Place '5 5 5 5 5  '$ Registration '3 3 3 3 3  '$ Attention/ Calculation 5 5 0 0 0  Recall '3 3 3 3 1  '$ Recall-comments - - - - pt was unable to recall 2 of 3 words  Language- name 2 objects - - 0 0 0  Language- repeat '1 1 1 1 1  '$ Language- follow 3 step command - - 0 3 3  Language- read & follow direction - - 0 0 0  Write a sentence - - 0 0 0  Copy design - - 0 0 0  Total score - - '17 20 18  '$ Mini Cog  Mini-Cog screen was completed. Maximum score is 22. A value of 0 denotes this part of the MMSE was not completed or the patient failed this part of the Mini-Cog screening.       Immunizations Immunization History  Administered Date(s) Administered   Fluad Quad(high Dose 65+) 12/01/2020   Influenza Whole 07/09/2008, 06/26/2013   Influenza, High Dose Seasonal PF 06/09/2018   Influenza, Seasonal, Injecte, Preservative Fre 06/20/2019   Influenza,inj,Quad PF,6+ Mos 07/30/2014, 08/09/2016   Influenza-Unspecified 05/22/2017   PFIZER(Purple Top)SARS-COV-2 Vaccination 11/22/2019, 12/17/2019, 07/09/2020   Pneumococcal Conjugate-13 03/08/2017   Pneumococcal Polysaccharide-23 03/14/2018   Tdap 10/29/2013   Zoster, Live 07/30/2014    TDAP status: Up to date  Flu Vaccine status: Up to date  Pneumococcal vaccine status: Up to date  Covid-19 vaccine status: Completed 3 vaccines  Qualifies for Shingles Vaccine? Yes   Zostavax completed Yes   Shingrix Completed?: No.    Education has been provided regarding the importance of this vaccine. Patient has been advised to call insurance company to determine out of pocket expense if  they have not yet received this vaccine. Advised may also receive vaccine at local pharmacy or Health Dept. Verbalized acceptance and understanding.  Screening Tests Health Maintenance  Topic Date Due   COLONOSCOPY (Pts 45-93yr Insurance coverage will need to be confirmed)  Never done   Zoster Vaccines- Shingrix (1 of 2) Never done   COVID-19 Vaccine (4 - Booster for Pfizer series) 11/09/2020   INFLUENZA VACCINE  04/26/2021   COLON CANCER SCREENING ANNUAL FOBT  04/29/2021   TETANUS/TDAP  10/30/2023   Hepatitis C Screening  Completed   PNA vac Low Risk Adult  Completed   HPV VACCINES  Aged Out    Health Maintenance  Health Maintenance Due  Topic Date Due   COLONOSCOPY (Pts 45-422yrInsurance coverage will need to be confirmed)  Never done   Zoster Vaccines- Shingrix (1 of 2) Never done   COVID-19 Vaccine (4 - Booster for Pfizer series) 11/09/2020   INFLUENZA VACCINE  04/26/2021    Colorectal cancer screening: Type of screening: FOBT/FIT. Completed 04/29/2020. Repeat every 1 years  Lung Cancer Screening: (Low Dose CT Chest recommended if Age 69-80ears, 30 pack-year currently smoking OR have quit w/in 15years.) does not qualify.   Additional  Screening:  Hepatitis C Screening: does qualify; Completed 02/29/2016  Vision Screening: Recommended annual ophthalmology exams for early detection of glaucoma and other disorders of the eye. Is the patient up to date with their annual eye exam?  Yes  Who is the provider or what is the name of the office in which the patient attends annual eye exams? [facility], Dr. Ellie Lunch If pt is not established with a provider, would they like to be referred to a provider to establish care? No .   Dental Screening: Recommended annual dental exams for proper oral hygiene  Community Resource Referral / Chronic Care Management: CRR required this visit?  No   CCM required this visit?  No      Plan:     I have personally reviewed and  noted the following in the patient's chart:   Medical and social history Use of alcohol, tobacco or illicit drugs  Current medications and supplements including opioid prescriptions. Patient is not currently taking opioid prescriptions. Functional ability and status Nutritional status Physical activity Advanced directives List of other physicians Hospitalizations, surgeries, and ER visits in previous 12 months Vitals Screenings to include cognitive, depression, and falls Referrals and appointments  In addition, I have reviewed and discussed with patient certain preventive protocols, quality metrics, and best practice recommendations. A written personalized care plan for preventive services as well as general preventive health recommendations were provided to patient.   Due to this being a telephonic visit, the after visit summary with patients personalized plan was offered to patient via office or my-chart. Patient preferred to pick up at office at next visit or via mychart.   Andrez Grime, LPN   [id number]

## 2021-04-28 NOTE — Patient Instructions (Signed)
Robert Fowler , Thank you for taking time to come for your Medicare Wellness Visit. I appreciate your ongoing commitment to your health goals. Please review the following plan we discussed and let me know if I can assist you in the future.   Screening recommendations/referrals: Colonoscopy: FOBT completed 04/29/2020, due 04/29/2021 Recommended yearly ophthalmology/optometry visit for glaucoma screening and checkup Recommended yearly dental visit for hygiene and checkup  Vaccinations: Influenza vaccine: Up to date, completed 12/01/2020, due Fall 2022 Pneumococcal vaccine: Completed series Tdap vaccine: Up to date, completed 10/29/2013, due 10/2023 Shingles vaccine: due, check with your insurance regarding coverage if interested    Covid-19: completed 3 vaccines   Advanced directives: copy in chart  Conditions/risks identified: hypertension   Next appointment: Follow up in one year for your annual wellness visit.   Preventive Care 34 Years and Older, Male Preventive care refers to lifestyle choices and visits with your health care provider that can promote health and wellness. What does preventive care include? A yearly physical exam. This is also called an annual well check. Dental exams once or twice a year. Routine eye exams. Ask your health care provider how often you should have your eyes checked. Personal lifestyle choices, including: Daily care of your teeth and gums. Regular physical activity. Eating a healthy diet. Avoiding tobacco and drug use. Limiting alcohol use. Practicing safe sex. Taking low doses of aspirin every day. Taking vitamin and mineral supplements as recommended by your health care provider. What happens during an annual well check? The services and screenings done by your health care provider during your annual well check will depend on your age, overall health, lifestyle risk factors, and family history of disease. Counseling  Your health care provider may ask you  questions about your: Alcohol use. Tobacco use. Drug use. Emotional well-being. Home and relationship well-being. Sexual activity. Eating habits. History of falls. Memory and ability to understand (cognition). Work and work Statistician. Screening  You may have the following tests or measurements: Height, weight, and BMI. Blood pressure. Lipid and cholesterol levels. These may be checked every 5 years, or more frequently if you are over 35 years old. Skin check. Lung cancer screening. You may have this screening every year starting at age 49 if you have a 30-pack-year history of smoking and currently smoke or have quit within the past 15 years. Fecal occult blood test (FOBT) of the stool. You may have this test every year starting at age 79. Flexible sigmoidoscopy or colonoscopy. You may have a sigmoidoscopy every 5 years or a colonoscopy every 10 years starting at age 67. Prostate cancer screening. Recommendations will vary depending on your family history and other risks. Hepatitis C blood test. Hepatitis B blood test. Sexually transmitted disease (STD) testing. Diabetes screening. This is done by checking your blood sugar (glucose) after you have not eaten for a while (fasting). You may have this done every 1-3 years. Abdominal aortic aneurysm (AAA) screening. You may need this if you are a current or former smoker. Osteoporosis. You may be screened starting at age 73 if you are at high risk. Talk with your health care provider about your test results, treatment options, and if necessary, the need for more tests. Vaccines  Your health care provider may recommend certain vaccines, such as: Influenza vaccine. This is recommended every year. Tetanus, diphtheria, and acellular pertussis (Tdap, Td) vaccine. You may need a Td booster every 10 years. Zoster vaccine. You may need this after age 36. Pneumococcal 13-valent  conjugate (PCV13) vaccine. One dose is recommended after age  23. Pneumococcal polysaccharide (PPSV23) vaccine. One dose is recommended after age 46. Talk to your health care provider about which screenings and vaccines you need and how often you need them. This information is not intended to replace advice given to you by your health care provider. Make sure you discuss any questions you have with your health care provider. Document Released: 10/09/2015 Document Revised: 06/01/2016 Document Reviewed: 07/14/2015 Elsevier Interactive Patient Education  2017 [employer] Prevention in the Home Falls can cause injuries. They can happen to people of all ages. There are many things you can do to make your home safe and to help prevent falls. What can I do on the outside of my home? Regularly fix the edges of walkways and driveways and fix any cracks. Remove anything that might make you trip as you walk through a door, such as a raised step or threshold. Trim any bushes or trees on the path to your home. Use bright outdoor lighting. Clear any walking paths of anything that might make someone trip, such as rocks or tools. Regularly check to see if handrails are loose or broken. Make sure that both sides of any steps have handrails. Any raised decks and porches should have guardrails on the edges. Have any leaves, snow, or ice cleared regularly. Use sand or salt on walking paths during winter. Clean up any spills in your garage right away. This includes oil or grease spills. What can I do in the bathroom? Use night lights. Install grab bars by the toilet and in the tub and shower. Do not use towel bars as grab bars. Use non-skid mats or decals in the tub or shower. If you need to sit down in the shower, use a plastic, non-slip stool. Keep the floor dry. Clean up any water that spills on the floor as soon as it happens. Remove soap buildup in the tub or shower regularly. Attach bath mats securely with double-sided non-slip rug tape. Do not have throw  rugs and other things on the floor that can make you trip. What can I do in the bedroom? Use night lights. Make sure that you have a light by your bed that is easy to reach. Do not use any sheets or blankets that are too big for your bed. They should not hang down onto the floor. Have a firm chair that has side arms. You can use this for support while you get dressed. Do not have throw rugs and other things on the floor that can make you trip. What can I do in the kitchen? Clean up any spills right away. Avoid walking on wet floors. Keep items that you use a lot in easy-to-reach places. If you need to reach something above you, use a strong step stool that has a grab bar. Keep electrical cords out of the way. Do not use floor polish or wax that makes floors slippery. If you must use wax, use non-skid floor wax. Do not have throw rugs and other things on the floor that can make you trip. What can I do with my stairs? Do not leave any items on the stairs. Make sure that there are handrails on both sides of the stairs and use them. Fix handrails that are broken or loose. Make sure that handrails are as long as the stairways. Check any carpeting to make sure that it is firmly attached to the stairs. Fix any carpet that  is loose or worn. Avoid having throw rugs at the top or bottom of the stairs. If you do have throw rugs, attach them to the floor with carpet tape. Make sure that you have a light switch at the top of the stairs and the bottom of the stairs. If you do not have them, ask someone to add them for you. What else can I do to help prevent falls? Wear shoes that: Do not have high heels. Have rubber bottoms. Are comfortable and fit you well. Are closed at the toe. Do not wear sandals. If you use a stepladder: Make sure that it is fully opened. Do not climb a closed stepladder. Make sure that both sides of the stepladder are locked into place. Ask someone to hold it for you, if  possible. Clearly mark and make sure that you can see: Any grab bars or handrails. First and last steps. Where the edge of each step is. Use tools that help you move around (mobility aids) if they are needed. These include: Canes. Walkers. Scooters. Crutches. Turn on the lights when you go into a dark area. Replace any light bulbs as soon as they burn out. Set up your furniture so you have a clear path. Avoid moving your furniture around. If any of your floors are uneven, fix them. If there are any pets around you, be aware of where they are. Review your medicines with your doctor. Some medicines can make you feel dizzy. This can increase your chance of falling. Ask your doctor what other things that you can do to help prevent falls. This information is not intended to replace advice given to you by your health care provider. Make sure you discuss any questions you have with your health care provider. Document Released: 07/09/2009 Document Revised: 02/18/2016 Document Reviewed: 10/17/2014 Elsevier Interactive Patient Education  2017 [employer].

## 2021-05-04 ENCOUNTER — Encounter: Payer: Medicare Other | Admitting: Family Medicine

## 2021-05-07 ENCOUNTER — Encounter: Payer: Self-pay | Admitting: Family Medicine

## 2021-05-07 ENCOUNTER — Other Ambulatory Visit: Payer: Self-pay

## 2021-05-07 ENCOUNTER — Ambulatory Visit (INDEPENDENT_AMBULATORY_CARE_PROVIDER_SITE_OTHER): Payer: Medicare Other | Admitting: Family Medicine

## 2021-05-07 VITALS — BP 108/82 | HR 88 | Temp 97.6°F | Ht 69.0 in | Wt 192.2 lb

## 2021-05-07 DIAGNOSIS — N401 Enlarged prostate with lower urinary tract symptoms: Secondary | ICD-10-CM

## 2021-05-07 DIAGNOSIS — F250 Schizoaffective disorder, bipolar type: Secondary | ICD-10-CM

## 2021-05-07 DIAGNOSIS — M8588 Other specified disorders of bone density and structure, other site: Secondary | ICD-10-CM | POA: Diagnosis not present

## 2021-05-07 DIAGNOSIS — R351 Nocturia: Secondary | ICD-10-CM

## 2021-05-07 DIAGNOSIS — Z1211 Encounter for screening for malignant neoplasm of colon: Secondary | ICD-10-CM | POA: Diagnosis not present

## 2021-05-07 DIAGNOSIS — Z0000 Encounter for general adult medical examination without abnormal findings: Secondary | ICD-10-CM | POA: Diagnosis not present

## 2021-05-07 DIAGNOSIS — I10 Essential (primary) hypertension: Secondary | ICD-10-CM

## 2021-05-07 DIAGNOSIS — F319 Bipolar disorder, unspecified: Secondary | ICD-10-CM

## 2021-05-07 MED ORDER — TAMSULOSIN HCL 0.4 MG PO CAPS: 0.4000 mg | ORAL_CAPSULE | Freq: Every day | ORAL | 3 refills | Status: DC

## 2021-05-07 MED ORDER — ALENDRONATE SODIUM 70 MG PO TABS: 70.0000 mg | ORAL_TABLET | ORAL | 3 refills | Status: DC

## 2021-05-07 MED ORDER — AMLODIPINE BESYLATE 2.5 MG PO TABS: 2.5000 mg | ORAL_TABLET | Freq: Every day | ORAL | 3 refills | Status: DC

## 2021-05-07 NOTE — Assessment & Plan Note (Signed)
Followed by psychiatry - appreciate their care of patient.

## 2021-05-07 NOTE — Assessment & Plan Note (Signed)
Preventative protocols reviewed and updated unless pt declined. Discussed healthy diet and lifestyle.  

## 2021-05-07 NOTE — Progress Notes (Signed)
Patient ID: Robert Fowler, male    DOB: [birth year], 70 y.o.   MRN: [id number]  This visit was conducted in person.  BP 108/82   Pulse 88   Temp 97.6 F (36.4 C) (Temporal)   Ht 5\' 9"  (1.753 m)   Wt 192 lb 4 oz (87.2 kg)   SpO2 96%   BMI 28.39 kg/m    CC: CPE Subjective:   HPI: Robert Fowler is a 70 y.o. male presenting on 05/07/2021 for Annual Exam (Prt 2. )   Saw health advisor last week for medicare wellness visit. Note reviewed.   No results found.  Flowsheet Row Clinical Support from 04/28/2021 in [facility]  PHQ-2 Total Score 0       Fall Risk  04/28/2021 04/21/2020 03/18/2019 03/14/2018 03/08/2017  Falls in the past year? 1 1 0 Yes Yes  Comment - tripped on carpet - per pt, multiple falls after behavioral health hospitalization pt states he was pulling weeds and fell backwards in yard; no injury, no medical treatment  Number falls in past yr: 0 0 - 2 or more 1  Injury with Fall? 0 0 - No No  Risk for fall due to : Medication side effect No Fall Risks - History of fall(s) Impaired balance/gait  Risk for fall due to: Comment - - - - -  Follow up Falls evaluation completed;Falls prevention discussed Falls evaluation completed;Falls prevention discussed - - -  Fell at [employer] school at church - no injury.   Sees Dr Robert Fowler psychiatrist at [facility]. Continues depakote and fluoxetine 20mg  and zyprexa 5/10mg  daily, hydroxyzine PRN.   Notes ongoing fatigue. This year we started amlodipine 2.5mg  daily - tolerating well, denies lightheadedness.  Cares for 86yo mother - she no longer drives.   Preventative: Colon cancer screening - yearly iFOB  Prostate cancer screening - would like to continue screening. BPH on DRE 12/2019. Nocturia x1-2, on flomax.  Osteoporosis DEXA -2.8 lumbar (03/2015) - fosamax started 03/2017.  Osteopenia DEXA -2.1 spine, -1.9 hip (04/2019)  Advised to let us know if jaw or hip pain due to possible atypical fractures.  Lung cancer screening -  not eligible  Flu shot yearly at [facility] 10/2019, 11/2019, booster 06/2020  Prevnar 02/2017, pneumovax 02/2018 Tdap 10/2013  zostavax - 07/2014  Shingrix - discussed  Advanced directive: Scanned into chart 02/2017. Mother Robert Fowler then sister Robert Fowler are HCPOA.  Seat belt use discussed.  Sunscreen use discussed. No changing moles on skin.  Ex smoker - quit ~2009  Alcohol - 2 glasses wine daily - advised cutting down on this. Strong fmhx alcoholism.  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 Interim medical history since our last visit reviewed. Allergies and medications reviewed and updated. Outpatient Medications Prior to Visit  Medication Sig Dispense Refill   acetaminophen (TYLENOL) 500 MG tablet Take 1,000 mg by mouth every 6 (six) hours as needed for moderate pain or headache.     aspirin 81  MG tablet Take 81 mg by mouth daily.     benztropine (COGENTIN) 0.5 MG tablet Take 0.5 mg by mouth at bedtime.     Calcium Citrate 333 MG TABS Take 2 tablets by mouth daily.     Cholecalciferol (VITAMIN D) 2000 units CAPS Take 1 capsule by mouth daily.     divalproex (DEPAKOTE ER) 250 MG 24 hr tablet      FLUoxetine (PROZAC) 20 MG capsule      hydrocortisone (ANUSOL-HC) 25 MG suppository Place 1 suppository (25 mg total) rectally 2 (two) times daily. 12 suppository 0   neomycin-polymyxin-pramoxine (NEOSPORIN PLUS) 1 % cream Apply 1 application topically 2 (two) times daily as needed (minor cuts/wounds).     OLANZapine (ZYPREXA) 10 MG tablet Take  1 tablet (10 mg total) by mouth at bedtime.     OLANZapine zydis (ZYPREXA) 5 MG disintegrating tablet Take 5 mg by mouth daily. Takes in mornings     alendronate (FOSAMAX) 70 MG tablet Take 1 tablet (70 mg total) by mouth once a week. Take with a full glass of water on an empty stomach. 12 tablet 3   amLODipine (NORVASC) 2.5 MG tablet Take 1 tablet (2.5 mg total) by mouth daily. 90 tablet 1   hydrOXYzine (ATARAX/VISTARIL) 25 MG tablet As needed     tamsulosin (FLOMAX) 0.4 MG CAPS capsule Take 1 capsule (0.4 mg total) by mouth daily. 90 capsule 3   hydrOXYzine (ATARAX/VISTARIL) 25 MG tablet Take 1 tablet (25 mg total) by mouth 2 (two) times daily as needed for anxiety. As needed     No facility-administered medications prior to visit.     Per HPI unless specifically indicated in ROS section below Review of Systems  Constitutional:  Negative for activity change, appetite change, chills, fatigue, fever and unexpected weight change.  HENT:  Negative for hearing loss.   Eyes:  Negative for visual disturbance.  Respiratory:  Positive for shortness of breath (mild). Negative for cough, chest tightness and wheezing.   Cardiovascular:  Negative for chest pain, palpitations and leg swelling.  Gastrointestinal:  Positive for blood in stool (rarely). Negative for abdominal distention, abdominal pain, constipation, diarrhea, nausea and vomiting.  Genitourinary:  Negative for difficulty urinating and hematuria.  Musculoskeletal:  Negative for arthralgias, myalgias and neck pain.  Skin:  Negative for rash.  Neurological:  Negative for dizziness, seizures, syncope and headaches.  Hematological:  Negative for adenopathy. Does not bruise/bleed easily.  Psychiatric/Behavioral:  Positive for dysphoric mood. The patient is not nervous/anxious.    Objective:  BP 108/82   Pulse 88   Temp 97.6 F (36.4 C) (Temporal)   Ht 5\' 9"  (1.753 m)   Wt 192 lb 4 oz (87.2 kg)   SpO2 96%   BMI 28.39 kg/m   Wt Readings  from Last 3 Encounters:  05/07/21 192 lb 4 oz (87.2 kg)  12/01/20 193 lb 8 oz (87.8 kg)  06/02/20 194 lb 3 oz (88.1 kg)      Physical Exam Vitals and nursing note reviewed.  Constitutional:      General: He is not in acute distress.    Appearance: Normal appearance. He is well-developed. He is not ill-appearing.  HENT:     Head: Normocephalic and atraumatic.     Right Ear: Hearing, tympanic membrane, ear canal and external ear normal.     Left Ear: Hearing, tympanic membrane, ear canal and external ear normal.  Eyes:     General: No scleral icterus.  Extraocular Movements: Extraocular movements intact.     Conjunctiva/sclera: Conjunctivae normal.     Pupils: Pupils are equal, round, and reactive to light.  Neck:     Thyroid: No thyroid mass or thyromegaly.     Vascular: No carotid bruit.  Cardiovascular:     Rate and Rhythm: Normal rate and regular rhythm.     Pulses: Normal pulses.          Radial pulses are 2+ on the right side and 2+ on the left side.     Heart sounds: Normal heart sounds. No murmur heard. Pulmonary:     Effort: Pulmonary effort is normal. No respiratory distress.     Breath sounds: Normal breath sounds. No wheezing, rhonchi or rales.  Abdominal:     General: Bowel sounds are normal. There is no distension.     Palpations: Abdomen is soft. There is no mass.     Tenderness: There is no abdominal tenderness. There is no guarding or rebound.     Hernia: No hernia is present.  Musculoskeletal:        General: Normal range of motion.     Cervical back: Normal range of motion and neck supple.     Right lower leg: No edema.     Left lower leg: No edema.  Lymphadenopathy:     Cervical: No cervical adenopathy.  Skin:    General: Skin is warm and dry.     Findings: No rash.  Neurological:     General: No focal deficit present.     Mental Status: He is alert and oriented to person, place, and time.  Psychiatric:        Mood and Affect: Mood normal.         Behavior: Behavior normal.        Thought Content: Thought content normal.        Judgment: Judgment normal.      Results for orders placed or performed in visit on 04/28/21  Microalbumin / creatinine urine ratio  Result Value Ref Range   Microalb, Ur <0.7 0.0 - 1.9 mg/dL   Creatinine,U 44.3 mg/dL   Microalb Creat Ratio 2.2 0.0 - 30.0 mg/g  PSA  Result Value Ref Range   PSA 1.71 0.10 - 4.00 ng/mL  CBC with Differential/Platelet  Result Value Ref Range   WBC 4.5 4.0 - 10.5 K/uL   RBC 4.65 4.22 - 5.81 Mil/uL   Hemoglobin 14.5 13.0 - 17.0 g/dL   HCT 24.6 99.7 - 80.2 %   MCV 90.1 78.0 - 100.0 fl   MCHC 34.7 30.0 - 36.0 g/dL   RDW 08.9 10.0 - 26.2 %   Platelets 162.0 150.0 - 400.0 K/uL   Neutrophils Relative % 63.6 43.0 - 77.0 %   Lymphocytes Relative 22.8 12.0 - 46.0 %   Monocytes Relative 10.5 3.0 - 12.0 %   Eosinophils Relative 2.2 0.0 - 5.0 %   Basophils Relative 0.9 0.0 - 3.0 %   Neutro Abs 2.9 1.4 - 7.7 K/uL   Lymphs Abs 1.0 0.7 - 4.0 K/uL   Monocytes Absolute 0.5 0.1 - 1.0 K/uL   Eosinophils Absolute 0.1 0.0 - 0.7 K/uL   Basophils Absolute 0.0 0.0 - 0.1 K/uL  Comprehensive metabolic panel  Result Value Ref Range   Sodium 134 (L) 135 - 145 mEq/L   Potassium 4.2 3.5 - 5.1 mEq/L   Chloride 98 96 - 112 mEq/L   CO2 26 19 - 32 mEq/L  Glucose, Bld 95 70 - 99 mg/dL   BUN 7 6 - 23 mg/dL   Creatinine, Ser 0.87 0.40 - 1.50 mg/dL   Total Bilirubin 1.2 0.2 - 1.2 mg/dL   Alkaline Phosphatase 56 39 - 117 U/L   AST 10 0 - 37 U/L   ALT 14 0 - 53 U/L   Total Protein 6.8 6.0 - 8.3 g/dL   Albumin 4.4 3.5 - 5.2 g/dL   GFR 88.26 >60.00 mL/min   Calcium 9.5 8.4 - 10.5 mg/dL    Assessment & Plan:  This visit occurred during the SARS-CoV-2 public health emergency.  Safety protocols were in place, including screening questions prior to the visit, additional usage of staff PPE, and extensive cleaning of exam room while observing appropriate contact time as indicated for disinfecting  solutions.   Problem List Items Addressed This Visit     Benign prostatic hyperplasia    Chronic, stable. Continue flomax.       Relevant Medications   tamsulosin (FLOMAX) 0.4 MG CAPS capsule   Osteopenia    Improved DEXA from 04/2019 reviewed. Continues fosamax. Next year will complete 5 yrs - will recheck DEXA at that time.  Continue cal, vit D, and regular weight bearing exercises.       Health maintenance examination - Primary    Preventative protocols reviewed and updated unless pt declined. Discussed healthy diet and lifestyle.       Bipolar 1 disorder ([place])    Followed by psychiatry - appreciate their care of patient.       Schizoaffective disorder ([place])   Essential hypertension    Chronic, stable. BP today somewhat low. He will buy BP cuff to start monitoring at home.       Relevant Medications   amLODipine (NORVASC) 2.5 MG tablet   Other Visit Diagnoses     Special screening for malignant neoplasms, colon       Relevant Orders   Fecal occult blood, imunochemical        Meds ordered this encounter  Medications   alendronate (FOSAMAX) 70 MG tablet    Sig: Take 1 tablet (70 mg total) by mouth once a week. Take with a full glass of water on an empty stomach.    Dispense:  12 tablet    Refill:  3   amLODipine (NORVASC) 2.5 MG tablet    Sig: Take 1 tablet (2.5 mg total) by mouth daily.    Dispense:  90 tablet    Refill:  3   tamsulosin (FLOMAX) 0.4 MG CAPS capsule    Sig: Take 1 capsule (0.4 mg total) by mouth daily.    Dispense:  90 capsule    Refill:  3   Orders Placed This Encounter  Procedures   Fecal occult blood, imunochemical    Standing Status:   Future    Standing Expiration Date:   05/07/2022    Patient instructions: Pass by lab to pick up stool kit. If interested, check with pharmacy about new 2 shot shingles series (shingrix).  Continue current medicines including fosamax  Return as needed or in 1 year for next physical/wellness visit.    Follow up plan: Return in about 1 year (around 05/07/2022) for annual exam, prior fasting for blood work.  [name], MD

## 2021-05-07 NOTE — Assessment & Plan Note (Addendum)
Chronic, stable. BP today somewhat low. He will buy BP cuff to start monitoring at home.

## 2021-05-07 NOTE — Assessment & Plan Note (Signed)
Chronic, stable. Continue flomax.

## 2021-05-07 NOTE — Assessment & Plan Note (Signed)
Improved DEXA from 04/2019 reviewed. Continues fosamax. Next year will complete 5 yrs - will recheck DEXA at that time.  Continue cal, vit D, and regular weight bearing exercises.

## 2021-05-07 NOTE — Patient Instructions (Addendum)
Pass by lab to pick up stool kit. If interested, check with pharmacy about new 2 shot shingles series (shingrix).  Continue current medicines including fosamax  Return as needed or in 1 year for next physical/wellness visit.   Health Maintenance After Age 69 After age 41, you are at a higher risk for certain long-term diseases and infections as well as injuries from falls. Falls are a major cause of broken bones and head injuries in people who are older than age 1. Getting regular preventive care can help to keep you healthy and well. Preventive care includes getting regular testing and making lifestyle changes as recommended by your health care provider. Talk with your health care provider about: Which screenings and tests you should have. A screening is a test that checks for a disease when you have no symptoms. A diet and exercise plan that is right for you. What should I know about screenings and tests to prevent falls? Screening and testing are the best ways to find a health problem early. Early diagnosis and treatment give you the best chance of managing medical conditions that are common after age 47. Certain conditions and lifestyle choices may make you more likely to have a fall. Your health care provider may recommend: Regular vision checks. Poor vision and conditions such as cataracts can make you more likely to have a fall. If you wear glasses, make sure to get your prescription updated if your vision changes. Medicine review. Work with your health care provider to regularly review all of the medicines you are taking, including over-the-counter medicines. Ask your health care provider about any side effects that may make you more likely to have a fall. Tell your health care provider if any medicines that you take make you feel dizzy or sleepy. Osteoporosis screening. Osteoporosis is a condition that causes the bones to get weaker. This can make the bones weak and cause them to break more  easily. Blood pressure screening. Blood pressure changes and medicines to control blood pressure can make you feel dizzy. Strength and balance checks. Your health care provider may recommend certain tests to check your strength and balance while standing, walking, or changing positions. Foot health exam. Foot pain and numbness, as well as not wearing proper footwear, can make you more likely to have a fall. Depression screening. You may be more likely to have a fall if you have a fear of falling, feel emotionally low, or feel unable to do activities that you used to do. Alcohol use screening. Using too much alcohol can affect your balance and may make you more likely to have a fall. What actions can I take to lower my risk of falls? General instructions Talk with your health care provider about your risks for falling. Tell your health care provider if: You fall. Be sure to tell your health care provider about all falls, even ones that seem minor. You feel dizzy, sleepy, or off-balance. Take over-the-counter and prescription medicines only as told by your health care provider. These include any supplements. Eat a healthy diet and maintain a healthy weight. A healthy diet includes low-fat dairy products, low-fat (lean) meats, and fiber from whole grains, beans, and lots of fruits and vegetables. Home safety Remove any tripping hazards, such as rugs, cords, and clutter. Install safety equipment such as grab bars in bathrooms and safety rails on stairs. Keep rooms and walkways well-lit. Activity  Follow a regular exercise program to stay fit. This will help you maintain your  balance. Ask your health care provider what types of exercise are appropriate for you. If you need a cane or walker, use it as recommended by your health care provider. Wear supportive shoes that have nonskid soles.  Lifestyle Do not drink alcohol if your health care provider tells you not to drink. If you drink alcohol,  limit how much you have: 0-1 drink a day for women. 0-2 drinks a day for men. Be aware of how much alcohol is in your drink. In the [place] one drink equals one typical bottle of beer (12 oz), one-half glass of wine (5 oz), or one shot of hard liquor (1 oz). Do not use any products that contain nicotine or tobacco, such as cigarettes and e-cigarettes. If you need help quitting, ask your health care provider. Summary Having a healthy lifestyle and getting preventive care can help to protect your health and wellness after age 98. Screening and testing are the best way to find a health problem early and help you avoid having a fall. Early diagnosis and treatment give you the best chance for managing medical conditions that are more common for people who are older than age 15. Falls are a major cause of broken bones and head injuries in people who are older than age 20. Take precautions to prevent a fall at home. Work with your health care provider to learn what changes you can make to improve your health and wellness and to prevent falls. This information is not intended to replace advice given to you by your health care provider. Make sure you discuss any questions you have with your healthcare provider. Document Revised: 08/28/2020 Document Reviewed: 08/28/2020 Elsevier Patient Education  2022 [employer].

## 2021-05-11 ENCOUNTER — Other Ambulatory Visit (INDEPENDENT_AMBULATORY_CARE_PROVIDER_SITE_OTHER): Payer: Medicare Other

## 2021-05-11 ENCOUNTER — Encounter: Payer: Self-pay | Admitting: Family Medicine

## 2021-05-11 DIAGNOSIS — Z1211 Encounter for screening for malignant neoplasm of colon: Secondary | ICD-10-CM | POA: Diagnosis not present

## 2021-05-11 LAB — FECAL OCCULT BLOOD, GUAIAC: Fecal Occult Blood: NEGATIVE

## 2021-05-11 LAB — FECAL OCCULT BLOOD, IMMUNOCHEMICAL: Fecal Occult Bld: NEGATIVE

## 2021-05-18 DIAGNOSIS — F250 Schizoaffective disorder, bipolar type: Secondary | ICD-10-CM | POA: Diagnosis not present

## 2021-05-18 DIAGNOSIS — F419 Anxiety disorder, unspecified: Secondary | ICD-10-CM | POA: Diagnosis not present

## 2021-08-04 DIAGNOSIS — H5213 Myopia, bilateral: Secondary | ICD-10-CM | POA: Diagnosis not present

## 2021-08-11 DIAGNOSIS — F419 Anxiety disorder, unspecified: Secondary | ICD-10-CM | POA: Diagnosis not present

## 2021-08-11 DIAGNOSIS — F250 Schizoaffective disorder, bipolar type: Secondary | ICD-10-CM | POA: Diagnosis not present

## 2021-08-24 ENCOUNTER — Telehealth: Payer: Self-pay | Admitting: Family Medicine

## 2021-08-24 NOTE — Chronic Care Management (AMB) (Signed)
  Chronic Care Management   Note  08/24/2021 Name: Robert Fowler MRN: [id number] DOB: 23-Nov-1950  Robert Fowler is a 70 y.o. year old male who is a primary care patient of [name], MD. I reached out to Fayette Pho by phone today in response to a referral sent by Mr. Lacey Chery's PCP, [name], MD.   Mr. Ferg was given information about Chronic Care Management services today including:  CCM service includes personalized support from designated clinical staff supervised by his physician, including individualized plan of care and coordination with other care providers 24/7 contact phone numbers for assistance for urgent and routine care needs. Service will only be billed when office clinical staff spend 20 minutes or more in a month to coordinate care. Only one practitioner may furnish and bill the service in a calendar month. The patient may stop CCM services at any time (effective at the end of the month) by phone call to the office staff.   Patient agreed to services and verbal consent obtained.   Follow up plan:   Tatjana Secretary/administrator

## 2021-10-01 ENCOUNTER — Telehealth: Payer: Self-pay

## 2021-10-01 NOTE — Chronic Care Management (AMB) (Signed)
Chronic Care Management Pharmacy Assistant   Name: Robert Fowler  MRN: [id number] DOB: 21-Mar-1951  Robert Fowler is an 71 y.o. year old male who presents for his initial CCM visit with the clinical pharmacist.  Reason for Encounter: Initial Questions    Conditions to be addressed/monitored: HTN   Recent office visits:  05/07/21-PCP-Javier Gutierrez,MD-Patient presented for AWV.Discussed screenings,vaccines,maintain healthy diet and lifestyle.Discussed purchasing of BP cuff.Ordered future labs, stool kit.No medication changes  12/01/20-PCP-Javier Gutierrez,MD-Patient presented for follow up hypertension.Plan to purchase BP cuff,no medication changes.  Recent consult visits:  None in 6 months  Hospital visits:  None in previous 6 months  Medications: Outpatient Encounter Medications as of 10/01/2021  Medication Sig   acetaminophen (TYLENOL) 500 MG tablet Take 1,000 mg by mouth every 6 (six) hours as needed for moderate pain or headache.   alendronate (FOSAMAX) 70 MG tablet Take 1 tablet (70 mg total) by mouth once a week. Take with a full glass of water on an empty stomach.   amLODipine (NORVASC) 2.5 MG tablet Take 1 tablet (2.5 mg total) by mouth daily.   aspirin 81 MG tablet Take 81 mg by mouth daily.   benztropine (COGENTIN) 0.5 MG tablet Take 0.5 mg by mouth at bedtime.   Calcium Citrate 333 MG TABS Take 2 tablets by mouth daily.   Cholecalciferol (VITAMIN D) 2000 units CAPS Take 1 capsule by mouth daily.   divalproex (DEPAKOTE ER) 250 MG 24 hr tablet    FLUoxetine (PROZAC) 20 MG capsule    hydrocortisone (ANUSOL-HC) 25 MG suppository Place 1 suppository (25 mg total) rectally 2 (two) times daily.   hydrOXYzine (ATARAX/VISTARIL) 25 MG tablet Take 1 tablet (25 mg total) by mouth 2 (two) times daily as needed for anxiety. As needed   neomycin-polymyxin-pramoxine (NEOSPORIN PLUS) 1 % cream Apply 1 application topically 2 (two) times daily as needed (minor cuts/wounds).   OLANZapine  (ZYPREXA) 10 MG tablet Take 1 tablet (10 mg total) by mouth at bedtime.   OLANZapine zydis (ZYPREXA) 5 MG disintegrating tablet Take 5 mg by mouth daily. Takes in mornings   tamsulosin (FLOMAX) 0.4 MG CAPS capsule Take 1 capsule (0.4 mg total) by mouth daily.   No facility-administered encounter medications on file as of 10/01/2021.     Lab Results  Component Value Date/Time   HGBA1C 5.3 01/20/2014 07:48 AM   MICROALBUR <0.7 04/28/2021 07:44 AM   MICROALBUR <0.7 06/02/2020 09:45 AM     BP Readings from Last 3 Encounters:  05/07/21 108/82  12/01/20 (!) 146/80  06/02/20 (!) 148/100    Patient contacted to review initial questions prior to visit with Al Corpus.  Have you seen any other providers since your last visit with PCP? No  Any changes in your medications or health? No  Any side effects from any medications? No  Do you have an symptoms or problems not managed by your medications? No  Any concerns about your health right now? The patient reports he has lots of anxiety especially in the am.  Has your provider asked that you check blood pressure, blood sugar, or follow special diet at home? Yes  The patient reports he has a manuel cuff at home, would like to get a digital monitor.  Do you get any type of exercise on a regular basis? 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  Can you think of a goal you would like to  reach for your health? No  Do you have any problems getting your medications? No  Is there anything that you would like to discuss during the appointment? No   Spoke with patient and reminded them to have all medications, supplements and any blood glucose and blood pressure readings available for review with pharmacist, at their telephone visit on 10/07/21 at [facility]:  Medication:  Last Fill: Day Supply No star meds identified  Amlodipine 2.$RemoveBefor'5mg'[id number]$  09/14/21 90   Care  Gaps: Annual wellness visit in last year? Yes Most Recent BP reading:102/82 88-P  05/07/21   Robert Fowler, CPP notified  Robert Fowler, [place] Assistant [phone]  Total time spent for month CPA: 40 min.

## 2021-10-07 ENCOUNTER — Ambulatory Visit (INDEPENDENT_AMBULATORY_CARE_PROVIDER_SITE_OTHER): Payer: Medicare Other | Admitting: Pharmacist

## 2021-10-07 ENCOUNTER — Other Ambulatory Visit: Payer: Self-pay

## 2021-10-07 DIAGNOSIS — N401 Enlarged prostate with lower urinary tract symptoms: Secondary | ICD-10-CM

## 2021-10-07 DIAGNOSIS — R351 Nocturia: Secondary | ICD-10-CM

## 2021-10-07 DIAGNOSIS — I10 Essential (primary) hypertension: Secondary | ICD-10-CM

## 2021-10-07 DIAGNOSIS — M8588 Other specified disorders of bone density and structure, other site: Secondary | ICD-10-CM

## 2021-10-07 DIAGNOSIS — F319 Bipolar disorder, unspecified: Secondary | ICD-10-CM

## 2021-10-07 NOTE — Progress Notes (Signed)
Chronic Care Management Pharmacy Note  10/07/2021 Name:  Robert Fowler MRN:  [id number] DOB:  Mar 31, 1951  Summary: -Pt endorses compliance with meds as prescribed; he denies issues; he reports his mood is stable on current regimen even with stressors (mother, brother and sister all experiencing health issues) -He sometimes forgets alendronate dose on Thursdays -Pt had flu shot and Covid booster in the fall; contacting pharmacies for dates  Recommendations/Changes made from today's visit: - No med changes -Advised to write alendronate dose on his calendar/planner each week to help remember  Plan: -CCM Health Concierge will call patient in 3 months for general adherence -PCP annual visit 04/2022 -Pharmacist follow up televisit scheduled for 12 months   Subjective: Robert Fowler is an 71 y.o. year old male who is a primary patient of Eustaquio Boyden, MD.  The CCM team was consulted for assistance with disease management and care coordination needs.    Engaged with patient by telephone for initial visit in response to provider referral for pharmacy case management and/or care coordination services.   Consent to Services:  The patient was given the following information about Chronic Care Management services today, agreed to services, and gave verbal consent: 1. CCM service includes personalized support from designated clinical staff supervised by the primary care provider, including individualized plan of care and coordination with other care providers 2. 24/7 contact phone numbers for assistance for urgent and routine care needs. 3. Service will only be billed when office clinical staff spend 20 minutes or more in a month to coordinate care. 4. Only one practitioner may furnish and bill the service in a calendar month. 5.The patient may stop CCM services at any time (effective at the end of the month) by phone call to the office staff. 6. The patient will be responsible for cost sharing (co-pay)  of up to 20% of the service fee (after annual deductible is met). Patient agreed to services and consent obtained.  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 His sister lives in [place] and just had lung resection after cancer diagnosis.  Recent office visits: 05/07/21-PCP-Javier Gutierrez,MD-Patient presented for AWV.Discussed screenings,vaccines,maintain healthy diet and lifestyle.Discussed purchasing of BP cuff.Ordered future labs, stool kit.No medication changes   12/01/20-PCP-Javier Gutierrez,MD-Patient presented for follow up hypertension.Plan to purchase BP cuff,no medication changes.  Recent consult visits: None  Hospital visits: None in previous 6 months   Objective:  Lab Results  Component Value Date   CREATININE 0.87 04/28/2021   BUN 7 04/28/2021   GFR 88.26 04/28/2021   GFRNONAA >60 04/17/2017   GFRAA >60 04/17/2017   NA 134 (L) 04/28/2021   K 4.2 04/28/2021   CALCIUM 9.5 04/28/2021   CO2 26 04/28/2021   GLUCOSE 95 04/28/2021    Lab Results  Component Value Date/Time   HGBA1C 5.3 01/20/2014 07:48 AM   GFR 88.26 04/28/2021 07:44 AM   GFR 90.83 04/21/2020 07:41 AM   MICROALBUR <0.7 04/28/2021 07:44 AM   MICROALBUR <0.7 06/02/2020 09:45 AM    Last diabetic Eye exam: No results found for: HMDIABEYEEXA  Last diabetic Foot exam: No results found for: HMDIABFOOTEX   Lab Results  Component Value Date   CHOL 165 04/21/2020   HDL 66.20 04/21/2020   LDLCALC 79 04/21/2020   LDLDIRECT 89 11/20/2012   TRIG 99.0 04/21/2020  CHOLHDL 2 04/21/2020    Hepatic Function Latest Ref Rng & Units 04/28/2021 04/21/2020 03/15/2019  Total Protein 6.0 - 8.3 g/dL 6.8 6.5 6.3  Albumin 3.5 - 5.2 g/dL 4.4 4.4 4.3  AST 0 - 37 U/L $Remo'10 9 6  'Gccsm$ ALT 0 - 53 U/L $Remo'14 12 12  'gZrbp$ Alk Phosphatase 39 - 117 U/L  56 47 45  Total Bilirubin 0.2 - 1.2 mg/dL 1.2 1.0 0.9  Bilirubin, Direct 0.0 - 0.3 mg/dL - - -    Lab Results  Component Value Date/Time   TSH 1.29 03/15/2019 08:42 AM   TSH 1.05 03/14/2018 09:15 AM    CBC Latest Ref Rng & Units 04/28/2021 04/21/2020 03/15/2019  WBC 4.0 - 10.5 K/uL 4.5 3.3(L) 4.1  Hemoglobin 13.0 - 17.0 g/dL 14.5 14.1 14.4  Hematocrit 39.0 - 52.0 % 41.9 40.8 41.5  Platelets 150.0 - 400.0 K/uL 162.0 158.0 191.0    Lab Results  Component Value Date/Time   VD25OH 51.11 04/21/2020 07:41 AM   VD25OH 48.83 03/15/2019 08:42 AM    Clinical ASCVD: No  The 10-year ASCVD risk score (Arnett DK, et al., 2019) is: 11.7%   Values used to calculate the score:     Age: 70 years     Sex: Male     Is Non-Hispanic African American: No     Diabetic: No     Tobacco smoker: No     Systolic Blood Pressure: 704 mmHg     Is BP treated: Yes     HDL Cholesterol: 66.2 mg/dL     Total Cholesterol: 165 mg/dL    Depression screen [facility] 2/9 04/28/2021 04/21/2020 03/18/2019  Decreased Interest 0 0 0  Down, Depressed, Hopeless 0 0 0  PHQ - 2 Score 0 0 0  Altered sleeping 0 0 0  Tired, decreased energy 0 0 0  Change in appetite 0 0 0  Feeling bad or failure about yourself  0 0 0  Trouble concentrating 0 0 0  Moving slowly or fidgety/restless 0 0 0  Suicidal thoughts 0 0 0  PHQ-9 Score 0 0 0  Difficult doing work/chores Not difficult at all Not difficult at all Not difficult at all     Social History   Tobacco Use  Smoking Status Former   Types: Cigarettes   Quit date: 09/26/2006   Years since quitting: 15.0  Smokeless Tobacco Never   BP Readings from Last 3 Encounters:  05/07/21 108/82  12/01/20 (!) 146/80  06/02/20 (!) 148/100   Pulse Readings from Last 3 Encounters:  05/07/21 88  12/01/20 66  06/02/20 63   Wt Readings from Last 3 Encounters:  05/07/21 192 lb 4 oz (87.2 kg)  12/01/20 193 lb 8 oz (87.8 kg)  06/02/20 194 lb 3 oz (88.1 kg)   BMI Readings from Last 3  Encounters:  05/07/21 28.39 kg/m  12/01/20 28.57 kg/m  06/02/20 28.68 kg/m    Assessment/Interventions: Review of patient past medical history, allergies, medications, health status, including review of consultants reports, laboratory and other test data, was performed as part of comprehensive evaluation and provision of chronic care management services.   SDOH:  (Social Determinants of Health) assessments and interventions performed: Yes  SDOH Screenings   Alcohol Screen: Low Risk    Last Alcohol Screening Score (AUDIT): 1  Depression (PHQ2-9): Low Risk    PHQ-2 Score: 0  Financial Resource Strain: Low Risk    Difficulty of Paying Living Expenses: Not hard at all  Food Insecurity:  No Food Insecurity   Worried About Charity fundraiser in the Last Year: Never true   Ran Out of Food in the Last Year: Never true  Housing: Low Risk    Last Housing Risk Score: 0  Physical Activity: Inactive   Days of Exercise per Week: 0 days   Minutes of Exercise per Session: 0 min  Social Connections: Not on file  Stress: No Stress Concern Present   Feeling of Stress : Not at all  Tobacco Use: Medium Risk   Smoking Tobacco Use: Former   Smokeless Tobacco Use: Never   Passive Exposure: Not on file  Transportation Needs: No Transportation Needs   Lack of Transportation (Medical): No   Lack of Transportation (Non-Medical): No    CCM Care Plan  Allergies  Allergen Reactions   Poison Oak Extract     Medications Reviewed Today     Reviewed by Charlton Haws, [facility] (Pharmacist) on 10/07/21 at 0927  Med List Status: <None>   Medication Order Taking? Sig Documenting Provider Last Dose Status Informant  acetaminophen (TYLENOL) 500 MG tablet [id number] Yes Take 1,000 mg by mouth every 6 (six) hours as needed for moderate pain or headache. [provider] Taking Active Self  alendronate (FOSAMAX) 70 MG tablet [id number] Yes Take 1 tablet (70 mg total) by mouth once a week. Take with  a full glass of water on an empty stomach. [name], MD Taking Active   amLODipine (NORVASC) 2.5 MG tablet [id number] Yes Take 1 tablet (2.5 mg total) by mouth daily. [name], MD Taking Active   aspirin 81 MG tablet [id number] Yes Take 81 mg by mouth daily. [provider] Taking Active Self  benztropine (COGENTIN) 0.5 MG tablet [id number] Yes Take 0.5 mg by mouth at bedtime. [provider] Taking Active Self  Calcium Citrate 333 MG TABS [id number] Yes Take 2 tablets by mouth daily. [name], MD Taking Active   Cholecalciferol (VITAMIN D) 125 MCG (5000 UT) CAPS [id number] Yes Take 1 capsule by mouth daily. [provider] Taking Active Self  divalproex (DEPAKOTE ER) 250 MG 24 hr tablet [id number] Yes 750 mg at bedtime. [provider] Taking Active   FLUoxetine (PROZAC) 20 MG capsule [id number] Yes Take 20 mg by mouth daily. [provider] Taking Active   hydrOXYzine (ATARAX/VISTARIL) 25 MG tablet [id number] Yes Take 1 tablet (25 mg total) by mouth 2 (two) times daily as needed for anxiety. As needed [name], MD Taking Active   OLANZapine (ZYPREXA) 10 MG tablet [id number] Yes Take 1 tablet (10 mg total) by mouth at bedtime. [name], MD Taking Active   OLANZapine zydis [facility]) 5 MG disintegrating tablet [id number] Yes Take 5 mg by mouth daily. Takes in mornings [provider] Taking Active   tamsulosin (FLOMAX) 0.4 MG CAPS capsule [id number] Yes Take 1 capsule (0.4 mg total) by mouth daily. [name], MD Taking Active   Med List Note Fredia Sorrow, CPhT 01/02/13 1000): Mental Health Patient. No preferred pharmacy.            Patient Active Problem List   Diagnosis Date Noted   Anal disorder 11/19/2019   Essential hypertension 04/17/2019   Schizoaffective disorder ([place])    Medicare annual wellness visit, subsequent 03/14/2018   Nocturnal dyspnea 06/13/2017   Bipolar 1 disorder ([place])  04/11/2017   Advanced care planning/counseling discussion 03/03/2015   Habitual alcohol use    Health maintenance examination 01/27/2014   Pes planus 01/11/2008  Osteopenia 09/04/2007   Ex-smoker 09/04/2007   Hematuria 07/18/2007   Benign prostatic hyperplasia 07/18/2007   Pain and swelling of left lower leg 07/18/2007    Immunization History  Administered Date(s) Administered   Fluad Quad(high Dose 65+) 12/01/2020   Influenza Whole 07/09/2008, 06/26/2013   Influenza, High Dose Seasonal PF 06/09/2018   Influenza, Seasonal, Injecte, Preservative Fre 06/20/2019   Influenza,inj,Quad PF,6+ Mos 07/30/2014, 08/09/2016   Influenza-Unspecified 05/22/2017   PFIZER(Purple Top)SARS-COV-2 Vaccination 11/22/2019, 12/17/2019, 07/09/2020   Pneumococcal Conjugate-13 03/08/2017   Pneumococcal Polysaccharide-23 03/14/2018   Tdap 10/29/2013   Zoster, Live 07/30/2014    Conditions to be addressed/monitored:  Hypertension, Osteopenia, BPH, and Bipolar 1 disorder  Care Plan : [place]  Updates made by Charlton Haws, [place] since 10/07/2021 12:00 AM     Problem: Hypertension, Osteopenia, BPH, and Bipolar 1 disorder   Priority: High     Long-Range Goal: Disease mgmt   Start Date: 10/07/2021  Expected End Date: 10/07/2022  This Visit's Progress: On track  Priority: High  Note:   Current Barriers:  Unable to independently monitor therapeutic efficacy  Pharmacist Clinical Goal(s):  Patient will achieve adherence to monitoring guidelines and medication adherence to achieve therapeutic efficacy through collaboration with PharmD and provider.   Interventions: 1:1 collaboration with [name], MD regarding development and update of comprehensive plan of care as evidenced by provider attestation and co-signature Inter-disciplinary care team collaboration (see longitudinal plan of care) Comprehensive medication review performed; medication list updated in electronic  medical record  Hypertension (BP goal <130/80) -Controlled - per clinic readings; pt does not have auto-cuff but plans on getting one -Current treatment: Amlodipine 2.5 mg daily AM - Appropriate, Effective, Safe, Accessible -Medications previously tried: none  -Current home readings: n/a -Denies hypotensive/hypertensive symptoms -Educated on BP goals and benefits of medications for prevention of heart attack, stroke and kidney damage; Importance of home blood pressure monitoring; -Counseled to monitor BP at home periodically -Recommended to continue current medication - advised pt to get BP monitor  Bipolar I / Schizoaffective disorder (Goal: manage symptoms) -Controlled - pt reports mood is stable; he has some sick family members that is taking its toll on his mental health right now but overall he feels well pt states he usually takes 1 hydroxyzine per day -Current treatment: Fluoxetine 20 mg daily - Appropriate, Effective, Safe, Accessible Hydroxyzine 25 mg PRN - Appropriate, Effective, Safe, Accessible Olanzapine 10 mg HS Olanzapine ODT 5 mg AM - Appropriate, Effective, Safe, Accessible Divaloproex 250 mg - 3 tab HS - Appropriate, Effective, Safe, Accessible Benztropine 0.5 mg HS - Appropriate, Effective, Safe, Accessible -Medications previously tried/failed: Abilify, paroxetine -Connected with Dr Josph Macho [place] [facility]) for mental health support -Educated on Benefits of medication for symptom control -Recommended to continue current medication  Osteopenia (Goal prevent fractures) -Controlled - pt states he sometimes forgets to take alendronate but overall is compliant with it -Last DEXA Scan: 05/09/19 (improved from prior on fosamax). Planning to recheck DEXA 2023 (5 years on fosamax)  T-Score femoral neck: -1.9  T-Score lumbar spine: -2.1  10-year probability of major osteoporotic fracture: 7.3%  10-year probability of hip fracture: 1.5% -Current treatment  Alendronate 70 mg weekly  Thursdays (started 03/2017) - Appropriate, Effective, Safe, Accessible Calcium citrate 600 mg daily AM Vitamin D 5000 IU -Counseled on oral bisphosphonate administration: take in the morning, 30 minutes prior to food with 6-8 oz of water. Do not lie down for at least 30 minutes after taking. Recommend  weight-bearing and muscle strengthening exercises for building and maintaining bone density.  BPH (Goal: manage symptoms) -Controlled - pt states he thinks med is helping urination -Current treatment  Tamsulosin 0.4 mg daily - Appropriate, Effective, Safe, Accessible -Recommended to continue current medication  Health Maintenance -Vaccine gaps: Flu, Covid booster, Shingrix -pt reports he had Flu shot @ [facility] and Covid booster @ [facility] in the fall 2022. Will contact pharmacies for dates to update chart. -Current therapy:  Tylenol 500 mg PRN Aspirin 81 mg daily -Patient is satisfied with current therapy and denies issues -Recommended to continue current medication  Patient Goals/Self-Care Activities Patient will:  - take medications as prescribed as evidenced by patient report and record review focus on medication adherence by routine check blood pressure periodically, document, and provide at future appointments -Write alendronate dose on calendar on Thursdays      Medication Assistance: None required.  Patient affirms current coverage meets needs.  Compliance/Adherence/Medication fill history: Care Gaps: Colonoscopy (never done) -- fecal occult blood done annually, last 04/2021 negative  Star-Rating Drugs: None  Patient's preferred pharmacy is:  [place], [place] [address] Phone: [phone] Fax: [phone]  Uses pill box? No - keeps AM meds on top of fridge and PM meds on his bedside table as reminders Pt endorses 100% compliance  We discussed: Current pharmacy is preferred with  insurance plan and patient is satisfied with pharmacy services Patient decided to: Continue current medication management strategy  Care Plan and Follow Up Patient Decision:  Patient agrees to Care Plan and Follow-up.  Plan: Telephone follow up appointment with care management team member scheduled for:  12 months  [name], PharmD, BCACP Clinical Pharmacist [facility] at [facility] [phone]

## 2021-10-07 NOTE — Patient Instructions (Signed)
Visit Information  Phone number for Pharmacist: [phone]  Thank you for meeting with me to discuss your medications! I look forward to working with you to achieve your health care goals. Below is a summary of what we talked about during the visit:   Goals Addressed             This Visit's Progress    Track and Manage My Blood Pressure-Hypertension       Timeframe:  Long-Range Goal Priority:  Medium Start Date:            10/07/21                 Expected End Date:        10/07/22               Follow Up Date Jan 2024   - check blood pressure 3 times per week - choose a place to take my blood pressure (home, clinic or office, retail store) - write blood pressure results in a log or diary    Why is this important?   You won't feel high blood pressure, but it can still hurt your blood vessels.  High blood pressure can cause heart or kidney problems. It can also cause a stroke.  Making lifestyle changes like losing a little weight or eating less salt will help.  Checking your blood pressure at home and at different times of the day can help to control blood pressure.  If the doctor prescribes medicine remember to take it the way the doctor ordered.  Call the office if you cannot afford the medicine or if there are questions about it.     Notes:         Care Plan : [place]  Updates made by Charlton Haws, RPH since 10/07/2021 12:00 AM     Problem: Hypertension, Osteopenia, BPH, and Bipolar 1 disorder   Priority: High     Long-Range Goal: Disease mgmt   Start Date: 10/07/2021  Expected End Date: 10/07/2022  This Visit's Progress: On track  Priority: High  Note:   Current Barriers:  Unable to independently monitor therapeutic efficacy  Pharmacist Clinical Goal(s):  Patient will achieve adherence to monitoring guidelines and medication adherence to achieve therapeutic efficacy through collaboration with PharmD and provider.   Interventions: 1:1  collaboration with [name], MD regarding development and update of comprehensive plan of care as evidenced by provider attestation and co-signature Inter-disciplinary care team collaboration (see longitudinal plan of care) Comprehensive medication review performed; medication list updated in electronic medical record  Hypertension (BP goal <130/80) -Controlled - per clinic readings; pt does not have auto-cuff but plans on getting one -Current treatment: Amlodipine 2.5 mg daily AM - Appropriate, Effective, Safe, Accessible -Medications previously tried: none  -Current home readings: n/a -Denies hypotensive/hypertensive symptoms -Educated on BP goals and benefits of medications for prevention of heart attack, stroke and kidney damage; Importance of home blood pressure monitoring; -Counseled to monitor BP at home periodically -Recommended to continue current medication - advised pt to get BP monitor  Bipolar I / Schizoaffective disorder (Goal: manage symptoms) -Controlled - pt reports mood is stable; he has some sick family members that is taking its toll on his mental health right now but overall he feels well pt states he usually takes 1 hydroxyzine per day -Current treatment: Fluoxetine 20 mg daily - Appropriate, Effective, Safe, Accessible Hydroxyzine 25 mg PRN - Appropriate, Effective, Safe, Accessible Olanzapine 10  mg HS Olanzapine ODT 5 mg AM - Appropriate, Effective, Safe, Accessible Divaloproex 250 mg - 3 tab HS - Appropriate, Effective, Safe, Accessible Benztropine 0.5 mg HS - Appropriate, Effective, Safe, Accessible -Medications previously tried/failed: Abilify, paroxetine -Connected with Dr Josph Macho [place] [facility]) for mental health support -Educated on Benefits of medication for symptom control -Recommended to continue current medication  Osteopenia (Goal prevent fractures) -Controlled - pt states he sometimes forgets to take alendronate but overall is compliant with  it -Last DEXA Scan: 05/09/19 (improved from prior on fosamax). Planning to recheck DEXA 2023 (5 years on fosamax)  T-Score femoral neck: -1.9  T-Score lumbar spine: -2.1  10-year probability of major osteoporotic fracture: 7.3%  10-year probability of hip fracture: 1.5% -Current treatment  Alendronate 70 mg weekly Thursdays (started 03/2017) - Appropriate, Effective, Safe, Accessible Calcium citrate 600 mg daily AM Vitamin D 5000 IU -Counseled on oral bisphosphonate administration: take in the morning, 30 minutes prior to food with 6-8 oz of water. Do not lie down for at least 30 minutes after taking. Recommend weight-bearing and muscle strengthening exercises for building and maintaining bone density.  BPH (Goal: manage symptoms) -Controlled - pt states he thinks med is helping urination -Current treatment  Tamsulosin 0.4 mg daily - Appropriate, Effective, Safe, Accessible -Recommended to continue current medication  Health Maintenance -Vaccine gaps: Flu, Covid booster, Shingrix -pt reports he had Flu shot @ [facility] and Covid booster @ [facility] in the fall 2022. Will contact pharmacies for dates to update chart. -Current therapy:  Tylenol 500 mg PRN Aspirin 81 mg daily -Patient is satisfied with current therapy and denies issues -Recommended to continue current medication  Patient Goals/Self-Care Activities Patient will:  - take medications as prescribed as evidenced by patient report and record review focus on medication adherence by routine check blood pressure periodically, document, and provide at future appointments -Write alendronate dose on calendar on Thursdays      Mr. Lachapelle was given information about Chronic Care Management services today including:  CCM service includes personalized support from designated clinical staff supervised by his physician, including individualized plan of care and coordination with other care providers 24/7 contact phone  numbers for assistance for urgent and routine care needs. Standard insurance, coinsurance, copays and deductibles apply for chronic care management only during months in which we provide at least 20 minutes of these services. Most insurances cover these services at 100%, however patients may be responsible for any copay, coinsurance and/or deductible if applicable. This service may help you avoid the need for more expensive face-to-face services. Only one practitioner may furnish and bill the service in a calendar month. The patient may stop CCM services at any time (effective at the end of the month) by phone call to the office staff.  Patient agreed to services and verbal consent obtained.   Patient verbalizes understanding of instructions and care plan provided today and agrees to view in [place]. Active MyChart status confirmed with patient.   Telephone follow up appointment with pharmacy team member scheduled for: 1 year  [name], PharmD, [facility] Clinical Pharmacist [facility] at [facility] [phone]

## 2021-10-21 DIAGNOSIS — F250 Schizoaffective disorder, bipolar type: Secondary | ICD-10-CM | POA: Diagnosis not present

## 2021-10-21 DIAGNOSIS — F419 Anxiety disorder, unspecified: Secondary | ICD-10-CM | POA: Diagnosis not present

## 2021-10-26 DIAGNOSIS — R351 Nocturia: Secondary | ICD-10-CM

## 2021-10-26 DIAGNOSIS — I10 Essential (primary) hypertension: Secondary | ICD-10-CM | POA: Diagnosis not present

## 2021-10-26 DIAGNOSIS — N401 Enlarged prostate with lower urinary tract symptoms: Secondary | ICD-10-CM

## 2021-10-26 DIAGNOSIS — F319 Bipolar disorder, unspecified: Secondary | ICD-10-CM

## 2021-12-31 ENCOUNTER — Telehealth: Payer: Self-pay

## 2021-12-31 NOTE — Chronic Care Management (AMB) (Signed)
? ? ?Chronic Care Management ?Pharmacy Assistant  ? ?Name: Robert Fowler  MRN: [id number] DOB: 1951/07/19 ? ?Reason for Encounter: General Adherence  ?  ? ?Recent office visits:  ?None since last CCM contact  ? ?Recent consult visits:  ?None since last CCM contact ? ?Hospital visits:  ?None in previous 6 months ? ?Medications: ?Outpatient Encounter Medications as of 12/31/2021  ?Medication Sig  ? acetaminophen (TYLENOL) 500 MG tablet Take 1,000 mg by mouth every 6 (six) hours as needed for moderate pain or headache.  ? alendronate (FOSAMAX) 70 MG tablet Take 1 tablet (70 mg total) by mouth once a week. Take with a full glass of water on an empty stomach.  ? amLODipine (NORVASC) 2.5 MG tablet Take 1 tablet (2.5 mg total) by mouth daily.  ? aspirin 81 MG tablet Take 81 mg by mouth daily.  ? benztropine (COGENTIN) 0.5 MG tablet Take 0.5 mg by mouth at bedtime.  ? Calcium Citrate 333 MG TABS Take 2 tablets by mouth daily.  ? Cholecalciferol (VITAMIN D) 125 MCG (5000 UT) CAPS Take 1 capsule by mouth daily.  ? divalproex (DEPAKOTE ER) 250 MG 24 hr tablet 750 mg at bedtime.  ? FLUoxetine (PROZAC) 20 MG capsule Take 20 mg by mouth daily.  ? hydrOXYzine (ATARAX/VISTARIL) 25 MG tablet Take 1 tablet (25 mg total) by mouth 2 (two) times daily as needed for anxiety. As needed  ? OLANZapine (ZYPREXA) 10 MG tablet Take 1 tablet (10 mg total) by mouth at bedtime.  ? OLANZapine zydis (ZYPREXA) 5 MG disintegrating tablet Take 5 mg by mouth daily. Takes in mornings  ? tamsulosin (FLOMAX) 0.4 MG CAPS capsule Take 1 capsule (0.4 mg total) by mouth daily.  ? ?No facility-administered encounter medications on file as of 12/31/2021.  ? ? ? ? ?Contacted Robert Fowler on 12/31/21 for general disease state and medication adherence call.  ? ?Patient is not more than 5 days past due for refill on the following medications per chart history: ? ?Star Medications: ?Medication Name/mg Last Fill Days Supply ?No star medications identified ? ?Amlodipine besyl  2.'5mg'$  12/15/21 90 ? ?What concerns do you have about your medications? No concerns at this time  ? ?The patient denies side effects with their medications.  ? ?How often do you forget or accidentally miss a dose? Never ? ?Do you use a pillbox? No  the patient reports he takes pills from the bottles, he has a section for morning meds and a section for evening medications . ? ?Are you having any problems getting your medications from your pharmacy? No [name]  ? ?Has the cost of your medications been a concern? No ? ?Since last visit with CPP, the following interventions have been made. The patient reports he is taking mediations regularly and not missing doses. He takes his BP intermittently. ? ?The patient has not had an ED visit since last contact.  ? ?The patient denies problems with their health. Patient has upcoming follow up with Dr.Fred  for his bipolar disorder. ? ?Patient denies concerns or questions for Robert Fowler, PharmD at this time.  ? ?Counseled patient on:  ?Great job taking medications, Importance of taking medication daily without missed doses, and Access to CCM team for any cost, medication or pharmacy concerns. ? ? ?Care Gaps: ?Annual wellness visit in last year? Yes ?Most Recent BP reading: 108/82  88-P 05/07/21 ? ? ? ?Upcoming appointments: ?No appointments scheduled within the next 30 days. ? ? ?Robert Fowler, CPP  notified ? ?Robert Fowler, CCMA ?Health concierge  ?[phone] ?

## 2022-01-05 DIAGNOSIS — F419 Anxiety disorder, unspecified: Secondary | ICD-10-CM | POA: Diagnosis not present

## 2022-01-05 DIAGNOSIS — F250 Schizoaffective disorder, bipolar type: Secondary | ICD-10-CM | POA: Diagnosis not present

## 2022-02-24 DIAGNOSIS — H5213 Myopia, bilateral: Secondary | ICD-10-CM | POA: Diagnosis not present

## 2022-03-03 DIAGNOSIS — H2181 Floppy iris syndrome: Secondary | ICD-10-CM | POA: Diagnosis not present

## 2022-03-03 DIAGNOSIS — H2512 Age-related nuclear cataract, left eye: Secondary | ICD-10-CM | POA: Diagnosis not present

## 2022-03-03 DIAGNOSIS — H21562 Pupillary abnormality, left eye: Secondary | ICD-10-CM | POA: Diagnosis not present

## 2022-03-03 DIAGNOSIS — H269 Unspecified cataract: Secondary | ICD-10-CM | POA: Diagnosis not present

## 2022-03-17 DIAGNOSIS — H2511 Age-related nuclear cataract, right eye: Secondary | ICD-10-CM | POA: Diagnosis not present

## 2022-03-17 DIAGNOSIS — H21561 Pupillary abnormality, right eye: Secondary | ICD-10-CM | POA: Diagnosis not present

## 2022-03-17 DIAGNOSIS — H269 Unspecified cataract: Secondary | ICD-10-CM | POA: Diagnosis not present

## 2022-03-17 DIAGNOSIS — H2181 Floppy iris syndrome: Secondary | ICD-10-CM | POA: Diagnosis not present

## 2022-04-01 ENCOUNTER — Encounter (HOSPITAL_COMMUNITY): Payer: Self-pay | Admitting: Emergency Medicine

## 2022-04-01 ENCOUNTER — Ambulatory Visit (HOSPITAL_COMMUNITY)
Admission: EM | Admit: 2022-04-01 | Discharge: 2022-04-01 | Disposition: A | Discharge status: Home / Self Care | Payer: Medicare Other | Attending: Internal Medicine | Admitting: Internal Medicine

## 2022-04-01 DIAGNOSIS — N3090 Cystitis, unspecified without hematuria: Secondary | ICD-10-CM | POA: Diagnosis not present

## 2022-04-01 DIAGNOSIS — R300 Dysuria: Secondary | ICD-10-CM | POA: Diagnosis not present

## 2022-04-01 DIAGNOSIS — R350 Frequency of micturition: Secondary | ICD-10-CM | POA: Insufficient documentation

## 2022-04-01 LAB — POCT URINALYSIS DIPSTICK, ED / UC
Bilirubin Urine: NEGATIVE
Glucose, UA: NEGATIVE mg/dL
Ketones, ur: NEGATIVE mg/dL
Nitrite: NEGATIVE
Protein, ur: 30 mg/dL — AB
Specific Gravity, Urine: 1.01 (ref 1.005–1.030)
Urobilinogen, UA: 0.2 mg/dL (ref 0.0–1.0)
pH: 7 (ref 5.0–8.0)

## 2022-04-01 MED ORDER — CIPROFLOXACIN HCL 250 MG PO TABS: 250.0000 mg | ORAL_TABLET | Freq: Two times a day (BID) | ORAL | 0 refills | Status: AC

## 2022-04-01 NOTE — Discharge Instructions (Signed)
Take ciprofloxacin every 12 hours for the next 7 days to treat your urinary tract infection.  Avoid drinking Kool-Aid and sweet tea as this is likely causing you to have some urinary irritation and urinary tract infection. Increase your water intake to at least 64 ounces of ice water per day to stay well-hydrated.  If you develop any new or worsening symptoms or do not improve in the next 2 to 3 days, please return.  If your symptoms are severe, please go to the emergency room.  Follow-up with your primary care provider for further evaluation and management of your symptoms as well as ongoing wellness visits.  I hope you feel better!

## 2022-04-01 NOTE — ED Triage Notes (Signed)
Pt reports urinary frequency, painful urination and loss of control. States symptoms began yesterday morning.

## 2022-04-01 NOTE — ED Provider Notes (Signed)
[facility]    CSN: [id number] Arrival date & time: 04/01/22  1761      History   Chief Complaint Chief Complaint  Patient presents with   Dysuria   Urinary Frequency    HPI Robert Fowler is a 71 y.o. male.   Patient presents to urgent care for evaluation of dysuria, urinary urgency, and urinary frequency since yesterday.  He told his friend about his symptoms and his friend gave him 2 different antibiotics to take since the friend's husband used to have similar problems.  Patient does not know the names of these antibiotics, but denies allergies to medications.  Patient thinks that he developed a urinary tract infection due to drinking water from milk jugs as he is in the process of fixing his water source at home.  He has been storing water and milk jugs from his mom's house for the last few days to use to make his Kool-Aid and extra sweet tea.  Patient mainly drinks Kool-Aid and sweet tea during the summer and does not drink very much water.  Denies fever/chills, abdominal pain, nausea, vomiting, back pain, constipation, penile discharge, rash, and dizziness.  Denies history of frequent urinary tract infections.  Has not attempted use of any over-the-counter medications for his symptoms prior to arriving at urgent care other than the antibiotics that his friend gave him.  Patient reports improvement of symptoms after taking 1 dose of antibiotics yesterday.    Dysuria Presenting symptoms: dysuria   Associated symptoms: urinary frequency   Urinary Frequency    Past Medical History:  Diagnosis Date   Arthritis    BENIGN PROSTATIC HYPERTROPHY 07/18/2007   Bipolar disorder ([place])    Habitual alcohol use    Lesion of oral mucosa 03/21/2018   Biopsy 03/2018 Tami Ribas) - INFLAMMATORY FIBROUS HYPERPLASIA CONSISTENT WITH DENTURE INJURY (EPULIS FISSURATUM).    OSTEOPOROSIS 09/04/2007   DEXA -2.8 lumbar (03/2015)   Pes planus    Prediabetes 2014   Schizoaffective disorder ([place])     h/o paranoid schizophrenia   Wears dentures    full upper and lower    Patient Active Problem List   Diagnosis Date Noted   Anal disorder 11/19/2019   Essential hypertension 04/17/2019   Schizoaffective disorder ([place])    Medicare annual wellness visit, subsequent 03/14/2018   Nocturnal dyspnea 06/13/2017   Bipolar 1 disorder ([place]) 04/11/2017   Advanced care planning/counseling discussion 03/03/2015   Habitual alcohol use    Health maintenance examination 01/27/2014   Pes planus 01/11/2008   Osteopenia 09/04/2007   Ex-smoker 09/04/2007   Hematuria 07/18/2007   Benign prostatic hyperplasia 07/18/2007   Pain and swelling of left lower leg 07/18/2007    Past Surgical History:  Procedure Laterality Date   INGUINAL HERNIA REPAIR Right 08/2014   Dr Gershon Crane   MASS EXCISION N/A 04/13/2018   Procedure: EXCISION LOWER LIP ORAL  MASS;  Surgeon: Beverly Gust, MD;  Location: [facility];  Service: ENT;  Laterality: N/A;       Home Medications    Prior to Admission medications   Medication Sig Start Date End Date Taking? Authorizing Provider  ciprofloxacin (CIPRO) 250 MG tablet Take 1 tablet (250 mg total) by mouth every 12 (twelve) hours for 7 days. 04/01/22 04/08/22 Yes StanhopeStasia Cavalier, FNP  acetaminophen (TYLENOL) 500 MG tablet Take 1,000 mg by mouth every 6 (six) hours as needed for moderate pain or headache.    [provider]  alendronate (FOSAMAX) 70 MG  tablet Take 1 tablet (70 mg total) by mouth once a week. Take with a full glass of water on an empty stomach. 05/07/21   [name], MD  amLODipine (NORVASC) 2.5 MG tablet Take 1 tablet (2.5 mg total) by mouth daily. 05/07/21   [name], MD  aspirin 81 MG tablet Take 81 mg by mouth daily.    [provider]  benztropine (COGENTIN) 0.5 MG tablet Take 0.5 mg by mouth at bedtime.    [provider]  Calcium Citrate 333 MG TABS Take 2 tablets by mouth daily. 10/30/19   [name], MD  Cholecalciferol (VITAMIN D) 125 MCG (5000 UT) CAPS Take 1 capsule by mouth daily.    [provider]  divalproex (DEPAKOTE ER) 250 MG 24 hr tablet 750 mg at bedtime. 02/23/18   [provider]  FLUoxetine (PROZAC) 20 MG capsule Take 20 mg by mouth daily. 02/23/18   [provider]  hydrOXYzine (ATARAX/VISTARIL) 25 MG tablet Take 1 tablet (25 mg total) by mouth 2 (two) times daily as needed for anxiety. As needed 05/07/21   [name], MD  OLANZapine (ZYPREXA) 10 MG tablet Take 1 tablet (10 mg total) by mouth at bedtime. 06/13/17   [name], MD  OLANZapine zydis (ZYPREXA) 5 MG disintegrating tablet Take 5 mg by mouth daily. Takes in mornings 10/29/19   [provider]  tamsulosin (FLOMAX) 0.4 MG CAPS capsule Take 1 capsule (0.4 mg total) by mouth daily. 05/07/21   [name], MD    Family History Family History  Problem Relation Age of Onset   Alcohol abuse Father    Alcohol abuse Maternal Grandmother    COPD Mother        smoker   CAD Neg Hx    Stroke Neg Hx    Cancer Neg Hx    Diabetes Neg Hx    Hypertension Neg Hx     Social History Social History   Tobacco Use   Smoking status: Former    Types: Cigarettes    Quit date: 09/26/2006    Years since quitting: 15.5   Smokeless tobacco: Never  Vaping Use   Vaping Use: Never used  Substance Use Topics   Alcohol use: Yes    Comment: occasional   Drug use: Yes    Types: Marijuana     Allergies   Poison oak extract   Review of Systems Review of Systems  Genitourinary:  Positive for dysuria and frequency.  Per HPI   Physical Exam Triage Vital Signs ED Triage Vitals  Enc Vitals Group     BP 04/01/22 1103 127/85     Pulse Rate 04/01/22 1103 71     Resp 04/01/22 1103 17     Temp 04/01/22 1103 98.3 F (36.8 C)     Temp Source 04/01/22 1103 Oral     SpO2 04/01/22 1103 96 %     Weight --      Height --      Head Circumference --      Peak Flow --       Pain Score 04/01/22 1101 0     Pain Loc --      Pain Edu? --      Excl. in [place]? --    No data found.  Updated Vital Signs BP 127/85 (BP Location: Right Arm)   Pulse 71   Temp 98.3 F (36.8 C) (Oral)   Resp 17   SpO2 96%   Visual  Acuity Right Eye Distance:   Left Eye Distance:   Bilateral Distance:    Right Eye Near:   Left Eye Near:    Bilateral Near:     Physical Exam Vitals and nursing note reviewed.  Constitutional:      Appearance: Normal appearance. He is not ill-appearing or toxic-appearing.     Comments: Very pleasant patient sitting on exam in position of comfort table in no acute distress.   HENT:     Head: Normocephalic and atraumatic.     Right Ear: Hearing, tympanic membrane, ear canal and external ear normal.     Left Ear: Hearing, tympanic membrane, ear canal and external ear normal.     Nose: Nose normal.     Mouth/Throat:     Lips: Pink.     Mouth: Mucous membranes are moist.  Eyes:     General: Lids are normal. Vision grossly intact. Gaze aligned appropriately.     Extraocular Movements: Extraocular movements intact.     Conjunctiva/sclera: Conjunctivae normal.     Pupils: Pupils are equal, round, and reactive to light.  Cardiovascular:     Rate and Rhythm: Normal rate and regular rhythm.     Heart sounds: Normal heart sounds, S1 normal and S2 normal.  Pulmonary:     Effort: Pulmonary effort is normal. No respiratory distress.     Breath sounds: Normal breath sounds and air entry.  Abdominal:     General: Bowel sounds are normal.     Palpations: Abdomen is soft.     Tenderness: There is no abdominal tenderness. There is no right CVA tenderness, left CVA tenderness or guarding.  Musculoskeletal:     Cervical back: Normal range of motion and neck supple.  Skin:    General: Skin is warm and dry.     Capillary Refill: Capillary refill takes less than 2 seconds.     Findings: No rash.  Neurological:     General: No focal deficit present.      Mental Status: He is alert and oriented to person, place, and time. Mental status is at baseline.     Cranial Nerves: No dysarthria or facial asymmetry.     Motor: No weakness.     Gait: Gait is intact. Gait normal.  Psychiatric:        Mood and Affect: Mood normal.        Speech: Speech normal.        Behavior: Behavior normal.        Thought Content: Thought content normal.        Judgment: Judgment normal.      UC Treatments / Results  Labs (all labs ordered are listed, but only abnormal results are displayed) Labs Reviewed  POCT URINALYSIS DIPSTICK, ED / UC - Abnormal; Notable for the following components:      Result Value   Hgb urine dipstick MODERATE (*)    Protein, ur 30 (*)    Leukocytes,Ua SMALL (*)    All other components within normal limits  URINE CULTURE    EKG   Radiology No results found.  Procedures Procedures (including critical care time)  Medications Ordered in UC Medications - No data to display  Initial Impression / Assessment and Plan / UC Course  I have reviewed the triage vital signs and the nursing notes.  Pertinent labs & imaging results that were available during my care of the patient were reviewed by me and considered in my medical decision making (see  chart for details).  1.  Cystitis Urinalysis shows moderate hemoglobin, proteinuria, and small leukocytes indicating probable urinary tract infection.  Urine culture is pending.  Plan to treat with ciprofloxacin 250 mg every 12 hours for the next 7 days.  Kidney function normal per CMP in 2022.  Patient denies allergies to antibiotics.  Encourage patient to decrease intake of Kool-Aid and sweet tea as this is likely irritating his urinary tract causing his symptoms and UTI.  Discussed increasing water intake to at least 64 ounces of water per day to maintain adequate hydration.  Vital signs and physical exam are stable in clinic today and without suspicion for pyelonephritis or urosepsis.   Patient encouraged to no longer take antibiotics given to him by his friends.   Discussed physical exam and available lab work findings in clinic with patient.  Counseled patient regarding appropriate use of medications and potential side effects for all medications recommended or prescribed today. Discussed red flag signs and symptoms of worsening condition,when to call the PCP office, return to urgent care, and when to seek higher level of care in the emergency department. Patient verbalizes understanding and agreement with plan. All questions answered. Patient discharged in stable condition.   Final Clinical Impressions(s) / UC Diagnoses   Final diagnoses:  Cystitis  Urinary frequency  Dysuria     Discharge Instructions      Take ciprofloxacin every 12 hours for the next 7 days to treat your urinary tract infection.  Avoid drinking Kool-Aid and sweet tea as this is likely causing you to have some urinary irritation and urinary tract infection. Increase your water intake to at least 64 ounces of ice water per day to stay well-hydrated.  If you develop any new or worsening symptoms or do not improve in the next 2 to 3 days, please return.  If your symptoms are severe, please go to the emergency room.  Follow-up with your primary care provider for further evaluation and management of your symptoms as well as ongoing wellness visits.  I hope you feel better!    ED Prescriptions     Medication Sig Dispense Auth. Provider   ciprofloxacin (CIPRO) 250 MG tablet Take 1 tablet (250 mg total) by mouth every 12 (twelve) hours for 7 days. 14 tablet Talbot Grumbling, FNP      PDMP not reviewed this encounter.   Talbot Grumbling, [place] 04/01/22 1208

## 2022-04-02 LAB — URINE CULTURE: Culture: NO GROWTH

## 2022-04-12 ENCOUNTER — Telehealth: Payer: Self-pay

## 2022-04-12 NOTE — Progress Notes (Signed)
    Chronic Care Management Pharmacy Assistant   Name: Yossi Hinchman  MRN: [id number] DOB: Aug 28, 1951  Reason for Encounter: CCM (General Adherence)  Recent office visits:  None since last CCM contact  Recent consult visits:  04/01/22 [facility] Dysuria Start: Ciprofloxacin 250 mg Final Diagnoses: Cystitis  Hospital visits:  None in previous 6 months  Medications: Outpatient Encounter Medications as of 04/12/2022  Medication Sig   acetaminophen (TYLENOL) 500 MG tablet Take 1,000 mg by mouth every 6 (six) hours as needed for moderate pain or headache.   alendronate (FOSAMAX) 70 MG tablet Take 1 tablet (70 mg total) by mouth once a week. Take with a full glass of water on an empty stomach.   amLODipine (NORVASC) 2.5 MG tablet Take 1 tablet (2.5 mg total) by mouth daily.   aspirin 81 MG tablet Take 81 mg by mouth daily.   benztropine (COGENTIN) 0.5 MG tablet Take 0.5 mg by mouth at bedtime.   Calcium Citrate 333 MG TABS Take 2 tablets by mouth daily.   Cholecalciferol (VITAMIN D) 125 MCG (5000 UT) CAPS Take 1 capsule by mouth daily.   divalproex (DEPAKOTE ER) 250 MG 24 hr tablet 750 mg at bedtime.   FLUoxetine (PROZAC) 20 MG capsule Take 20 mg by mouth daily.   hydrOXYzine (ATARAX/VISTARIL) 25 MG tablet Take 1 tablet (25 mg total) by mouth 2 (two) times daily as needed for anxiety. As needed   OLANZapine (ZYPREXA) 10 MG tablet Take 1 tablet (10 mg total) by mouth at bedtime.   OLANZapine zydis (ZYPREXA) 5 MG disintegrating tablet Take 5 mg by mouth daily. Takes in mornings   tamsulosin (FLOMAX) 0.4 MG CAPS capsule Take 1 capsule (0.4 mg total) by mouth daily.   No facility-administered encounter medications on file as of 04/12/2022.   Contacted Lamorris Knoblock on 04/15/2022 for general disease state and medication adherence call.   Star Medications: Medication Name/mg Last Fill Days Supply No star meds noted  What concerns do you have about your medications? Patient does not  have any concerns  The patient denies side effects with their medications.   How often do you forget or accidentally miss a dose? Rarely  Do you use a pillbox? No  Are you having any problems getting your medications from your pharmacy? No  Has the cost of your medications been a concern? No  Since last visit with CPP, no interventions have been made.   The patient has not had an ED visit since last contact.   The patient denies problems with their health.   Patient denies concerns or questions for [name], PharmD at this time.   Care Gaps: Annual wellness visit in last year? Yes 04/28/2021 Most Recent BP reading: 127/85 on 04/01/2022  Upcoming appointments: PCP appointment on 05/09/2022 CCM appointment on 10/06/2021  [name], CPP notified  Marijean Niemann, [place] Assistant [phone]

## 2022-04-15 DIAGNOSIS — Z961 Presence of intraocular lens: Secondary | ICD-10-CM | POA: Diagnosis not present

## 2022-04-19 ENCOUNTER — Other Ambulatory Visit: Payer: Self-pay | Admitting: Family Medicine

## 2022-04-28 ENCOUNTER — Telehealth: Payer: Self-pay | Admitting: Family Medicine

## 2022-04-28 NOTE — Telephone Encounter (Signed)
Left message for patient to call back and schedule Medicare Annual Wellness Visit (AWV) either virtually or phone   Last AWV 04/28/21   I left my direct # [phone]

## 2022-04-30 ENCOUNTER — Other Ambulatory Visit: Payer: Self-pay | Admitting: Family Medicine

## 2022-04-30 DIAGNOSIS — I10 Essential (primary) hypertension: Secondary | ICD-10-CM

## 2022-04-30 DIAGNOSIS — F319 Bipolar disorder, unspecified: Secondary | ICD-10-CM

## 2022-04-30 DIAGNOSIS — M8588 Other specified disorders of bone density and structure, other site: Secondary | ICD-10-CM

## 2022-04-30 DIAGNOSIS — F1090 Alcohol use, unspecified, uncomplicated: Secondary | ICD-10-CM

## 2022-04-30 DIAGNOSIS — N401 Enlarged prostate with lower urinary tract symptoms: Secondary | ICD-10-CM

## 2022-05-02 ENCOUNTER — Other Ambulatory Visit (INDEPENDENT_AMBULATORY_CARE_PROVIDER_SITE_OTHER): Payer: Medicare Other

## 2022-05-02 DIAGNOSIS — M8588 Other specified disorders of bone density and structure, other site: Secondary | ICD-10-CM

## 2022-05-02 DIAGNOSIS — F1090 Alcohol use, unspecified, uncomplicated: Secondary | ICD-10-CM

## 2022-05-02 DIAGNOSIS — R351 Nocturia: Secondary | ICD-10-CM | POA: Diagnosis not present

## 2022-05-02 DIAGNOSIS — I10 Essential (primary) hypertension: Secondary | ICD-10-CM | POA: Diagnosis not present

## 2022-05-02 DIAGNOSIS — N401 Enlarged prostate with lower urinary tract symptoms: Secondary | ICD-10-CM

## 2022-05-02 DIAGNOSIS — F319 Bipolar disorder, unspecified: Secondary | ICD-10-CM

## 2022-05-02 LAB — CBC WITH DIFFERENTIAL/PLATELET
Basophils Absolute: 0 10*3/uL (ref 0.0–0.1)
Basophils Relative: 1.1 % (ref 0.0–3.0)
Eosinophils Absolute: 0.1 10*3/uL (ref 0.0–0.7)
Eosinophils Relative: 2.9 % (ref 0.0–5.0)
HCT: 43 % (ref 39.0–52.0)
Hemoglobin: 14.9 g/dL (ref 13.0–17.0)
Lymphocytes Relative: 27.3 % (ref 12.0–46.0)
Lymphs Abs: 0.9 10*3/uL (ref 0.7–4.0)
MCHC: 34.7 g/dL (ref 30.0–36.0)
MCV: 88.9 fl (ref 78.0–100.0)
Monocytes Absolute: 0.4 10*3/uL (ref 0.1–1.0)
Monocytes Relative: 10.7 % (ref 3.0–12.0)
Neutro Abs: 2 10*3/uL (ref 1.4–7.7)
Neutrophils Relative %: 58 % (ref 43.0–77.0)
Platelets: 162 10*3/uL (ref 150.0–400.0)
RBC: 4.83 Mil/uL (ref 4.22–5.81)
RDW: 14.5 % (ref 11.5–15.5)
WBC: 3.4 10*3/uL — ABNORMAL LOW (ref 4.0–10.5)

## 2022-05-02 LAB — LIPID PANEL
Cholesterol: 204 mg/dL — ABNORMAL HIGH (ref 0–200)
HDL: 76.1 mg/dL (ref >39.00)
LDL Cholesterol: 106 mg/dL — ABNORMAL HIGH (ref 0–99)
NonHDL: 128.15
Total CHOL/HDL Ratio: 3
Triglycerides: 112 mg/dL (ref 0.0–149.0)
VLDL: 22.4 mg/dL (ref 0.0–40.0)

## 2022-05-02 LAB — COMPREHENSIVE METABOLIC PANEL
ALT: 16 U/L (ref 0–53)
AST: 11 U/L (ref 0–37)
Albumin: 4.5 g/dL (ref 3.5–5.2)
Alkaline Phosphatase: 58 U/L (ref 39–117)
BUN: 7 mg/dL (ref 6–23)
CO2: 27 mEq/L (ref 19–32)
Calcium: 9.6 mg/dL (ref 8.4–10.5)
Chloride: 100 mEq/L (ref 96–112)
Creatinine, Ser: 0.89 mg/dL (ref 0.40–1.50)
GFR: 87.03 mL/min (ref >60.00)
Glucose, Bld: 94 mg/dL (ref 70–99)
Potassium: 4.7 mEq/L (ref 3.5–5.1)
Sodium: 137 mEq/L (ref 135–145)
Total Bilirubin: 1 mg/dL (ref 0.2–1.2)
Total Protein: 6.8 g/dL (ref 6.0–8.3)

## 2022-05-02 LAB — VITAMIN B12: Vitamin B-12: 254 pg/mL (ref 211–911)

## 2022-05-02 LAB — VITAMIN D 25 HYDROXY (VIT D DEFICIENCY, FRACTURES): VITD: 67.69 ng/mL (ref 30.00–100.00)

## 2022-05-02 LAB — PSA: PSA: 5.38 ng/mL — ABNORMAL HIGH (ref 0.10–4.00)

## 2022-05-02 LAB — FOLATE: Folate: 7.1 ng/mL (ref >5.9)

## 2022-05-02 LAB — TSH: TSH: 1.8 u[IU]/mL (ref 0.35–5.50)

## 2022-05-05 ENCOUNTER — Ambulatory Visit (INDEPENDENT_AMBULATORY_CARE_PROVIDER_SITE_OTHER): Payer: Medicare Other

## 2022-05-05 VITALS — Ht 69.0 in | Wt 192.0 lb

## 2022-05-05 DIAGNOSIS — Z0000 Encounter for general adult medical examination without abnormal findings: Secondary | ICD-10-CM | POA: Diagnosis not present

## 2022-05-05 LAB — VITAMIN B1: Vitamin B1 (Thiamine): 8 nmol/L (ref 8–30)

## 2022-05-05 NOTE — Progress Notes (Signed)
Subjective:   Robert Fowler is a 71 y.o. male who presents for Medicare Annual/Subsequent preventive examination.  Review of Systems    Virtual Visit via Telephone Note  I connected with  Robert Fowler on 05/05/22 at  8:15 AM EDT by telephone and verified that I am speaking with the correct person using two identifiers.  Location: Patient: Home Provider: Office Persons participating in the virtual visit: patient/Nurse Health Advisor   I discussed the limitations, risks, security and privacy concerns of performing an evaluation and management service by telephone and the availability of in person appointments. The patient expressed understanding and agreed to proceed.  Interactive audio and video telecommunications were attempted between this nurse and patient, however failed, due to patient having technical difficulties OR patient did not have access to video capability.  We continued and completed visit with audio only.  Some vital signs may be absent or patient reported.   Criselda Peaches, LPN  Cardiac Risk Factors include: advanced age (>73mn, >>84women);hypertension;male gender     Objective:    Today's Vitals   05/05/22 0821  Weight: 192 lb (87.1 kg)  Height: '5\' 9"'$  (1.753 m)   Body mass index is 28.35 kg/m.     05/05/2022    8:36 AM 04/28/2021    1:58 PM 04/21/2020   11:17 AM 03/18/2019   10:32 AM 04/13/2018    8:03 AM 03/14/2018    8:25 AM 04/24/2017   12:12 PM  Advanced Directives  Does Patient Have a Medical Advance Directive? Yes Yes Yes Yes Yes Yes   Type of AParamedicof [place]Living will [place]Living will HPalm HarborLiving will HTavistockLiving will HAtascosaLiving will [place]ixonLiving will   Does patient want to make changes to medical advance directive? No - Patient declined   No - Patient declined No - Patient declined    Copy of  [place] Chart? No - copy requested Yes - validated most recent copy scanned in chart (See row information) Yes - validated most recent copy scanned in chart (See row information) No - copy requested No - copy requested No - copy requested   Would patient like information on creating a medical advance directive?            Information is confidential and restricted. Go to Review Flowsheets to unlock data.    Current Medications (verified) Outpatient Encounter Medications as of 05/05/2022  Medication Sig   acetaminophen (TYLENOL) 500 MG tablet Take 1,000 mg by mouth every 6 (six) hours as needed for moderate pain or headache.   alendronate (FOSAMAX) 70 MG tablet Take 1 tablet (70 mg total) by mouth once a week. Take with a full glass of water on an empty stomach.   amLODipine (NORVASC) 2.5 MG tablet TAKE 1 TABLET BY MOUTH ONCE A DAY   aspirin 81 MG tablet Take 81 mg by mouth daily.   benztropine (COGENTIN) 0.5 MG tablet Take 0.5 mg by mouth at bedtime.   Calcium Citrate 333 MG TABS Take 2 tablets by mouth daily.   Cholecalciferol (VITAMIN D) 125 MCG (5000 UT) CAPS Take 1 capsule by mouth daily.   divalproex (DEPAKOTE ER) 250 MG 24 hr tablet 750 mg at bedtime.   FLUoxetine (PROZAC) 20 MG capsule Take 20 mg by mouth daily.   hydrOXYzine (ATARAX/VISTARIL) 25 MG tablet Take 1 tablet (25 mg total) by mouth 2 (two) times  daily as needed for anxiety. As needed   OLANZapine (ZYPREXA) 10 MG tablet Take 1 tablet (10 mg total) by mouth at bedtime.   OLANZapine zydis (ZYPREXA) 5 MG disintegrating tablet Take 5 mg by mouth daily. Takes in mornings   tamsulosin (FLOMAX) 0.4 MG CAPS capsule TAKE 1 CAPSULE BY MOUTH ONCE DAILY   No facility-administered encounter medications on file as of 05/05/2022.    Allergies (verified) Poison oak extract   History: Past Medical History:  Diagnosis Date   Arthritis    BENIGN PROSTATIC HYPERTROPHY 07/18/2007   Bipolar disorder ([place])     Habitual alcohol use    Lesion of oral mucosa 03/21/2018   Biopsy 03/2018 Tami Ribas) - INFLAMMATORY FIBROUS HYPERPLASIA CONSISTENT WITH DENTURE INJURY (EPULIS FISSURATUM).    OSTEOPOROSIS 09/04/2007   DEXA -2.8 lumbar (03/2015)   Pes planus    Prediabetes 2014   Schizoaffective disorder ([place])    h/o paranoid schizophrenia   Wears dentures    full upper and lower   Past Surgical History:  Procedure Laterality Date   INGUINAL HERNIA REPAIR Right 08/2014   Dr Gershon Crane   MASS EXCISION N/A 04/13/2018   Procedure: EXCISION LOWER LIP ORAL  MASS;  Surgeon:  Gust, MD;  Location: [facility];  Service: ENT;  Laterality: N/A;   Family History  Problem Relation Age of Onset   Alcohol abuse Father    Alcohol abuse Maternal Grandmother    COPD Mother        smoker   CAD Neg Hx    Stroke Neg Hx    Cancer Neg Hx    Diabetes Neg Hx    Hypertension Neg Hx    Social History   Socioeconomic History   Marital status: Single    Spouse name: Not on file   Number of children: Not on file   Years of education: Not on file   Highest education level: Not on file  Occupational History   Not on file  Tobacco Use   Smoking status: Former    Types: Cigarettes    Quit date: 09/26/2006    Years since quitting: 15.6   Smokeless tobacco: Never  Vaping Use   Vaping Use: Never used  Substance and Sexual Activity   Alcohol use: Yes    Comment: occasional   Drug use: Yes    Types: Marijuana   Sexual activity: Not Currently  Other Topics Concern   Not on file  Social History Narrative   Lives alone.     Mother and brother live nearby.   Occupation: Retail buyer at [employer]   Edu: 12th grade   Activity: mows yard   Diet: good water, fruits/vegetables daily      Disability evaluation - no disability (04/2014)   Social Determinants of Health   Financial Resource Strain: Low Risk  (05/05/2022)   Overall Financial Resource Strain (CARDIA)    Difficulty of Paying Living Expenses: Not  hard at all  Food Insecurity: No Food Insecurity (05/05/2022)   Hunger Vital Sign    Worried About Running Out of Food in the Last Year: Never true    Ran Out of Food in the Last Year: Never true  Transportation Needs: No Transportation Needs (05/05/2022)   PRAPARE - Hydrologist (Medical): No    Lack of Transportation (Non-Medical): No  Physical Activity: Inactive (05/05/2022)   Exercise Vital Sign    Days of Exercise per Week: 0 days    Minutes  of Exercise per Session: 0 min  Stress: No Stress Concern Present (05/05/2022)   [place]    Feeling of Stress : Only a little  Social Connections: Moderately Integrated (05/05/2022)   Social Connection and Isolation Panel [NHANES]    Frequency of Communication with Friends and Family: More than three times a week    Frequency of Social Gatherings with Friends and Family: More than three times a week    Attends Religious Services: More than 4 times per year    Active Member of [employer] or Organizations: Yes    Attends Music therapist: More than 4 times per year    Marital Status: Never married    Tobacco Counseling Counseling given: Not Answered   Clinical Intake:  Pre-visit preparation completed: No  Pain : No/denies pain     BMI - recorded: 28.38 Nutritional Status: BMI 25 -29 Overweight Nutritional Risks: None Diabetes: No  How often do you need to have someone help you when you read instructions, pamphlets, or other written materials from your doctor or pharmacy?: 1 - Never  Diabetic?  No  Interpreter Needed?: No  Information entered by :: Rolene Arbour LPN   Activities of Daily Living    05/05/2022    8:33 AM  In your present state of health, do you have any difficulty performing the following activities:  Hearing? 0  Vision? 0  Difficulty concentrating or making decisions? 0  Walking or climbing stairs? 0   Dressing or bathing? 0  Doing errands, shopping? 0  Preparing Food and eating ? N  Using the Toilet? N  In the past six months, have you accidently leaked urine? N  Do you have problems with loss of bowel control? N  Managing your Medications? N  Managing your Finances? N  Housekeeping or managing your Housekeeping? N    Patient Care Team: [name], MD as PCP - General (Family Medicine) Charlton Haws, [facility] as Pharmacist (Pharmacist)  Indicate any recent Medical Services you may have received from other than [facility] providers in the past year (date may be approximate).     Assessment:   This is a routine wellness examination for Louis.  Hearing/Vision screen Hearing Screening - Comments:: No hearing difficulty Vision Screening - Comments:: Wears glasses. Followed by Dr Celene Squibb  Dietary issues and exercise activities discussed: Exercise limited by: None identified   Goals Addressed               This Visit's Progress     Stay Busy (pt-stated)        Continue to take care of my Mom.       Depression Screen    05/05/2022    8:28 AM 04/28/2021    2:02 PM 04/21/2020   11:21 AM 03/18/2019   10:26 AM 03/14/2018    8:32 AM 03/13/2017    9:37 AM 03/08/2017    8:37 AM  PHQ 2/9 Scores  PHQ - 2 Score 3 0 0 0 0 0 0  PHQ- 9 Score 4 0 0 0 0 2     Fall Risk    05/05/2022    8:34 AM 04/28/2021    2:00 PM 04/21/2020   11:19 AM 03/18/2019   10:26 AM 03/14/2018    8:32 AM  Fall Risk   Falls in the past year? '1 1 1 '$ 0 Yes  Comment   tripped on carpet  per pt, multiple falls after behavioral  health hospitalization  Number falls in past yr: 0 0 0  2 or more  Injury with Fall? 0 0 0  No  Risk for fall due to : No Fall Risks Medication side effect No Fall Risks  History of fall(s)  Follow up  Falls evaluation completed;Falls prevention discussed Falls evaluation completed;Falls prevention discussed      FALL RISK PREVENTION PERTAINING TO THE HOME:  Any stairs in or  around the home? Yes  If so, are there any without handrails? No  Home free of loose throw rugs in walkways, pet beds, electrical cords, etc? Yes  Adequate lighting in your home to reduce risk of falls? Yes   ASSISTIVE DEVICES UTILIZED TO PREVENT FALLS:  Life alert? No  Use of a cane, walker or w/c? No  Grab bars in the bathroom? Yes  Shower chair or bench in shower? No  Elevated toilet seat or a handicapped toilet? Yes   TIMED UP AND GO:  Was the test performed? No . Audio Visit  Cognitive Function:    04/28/2021    2:08 PM 04/21/2020   11:27 AM 03/18/2019   10:27 AM 03/14/2018    8:32 AM 03/08/2017    9:05 AM  MMSE - Mini Mental State Exam  Orientation to time '5 5 5 5 5  '$ Orientation to Place '5 5 5 5 5  '$ Registration '3 3 3 3 3  '$ Attention/ Calculation 5 5 0 0 0  Recall '3 3 3 3 1  '$ Recall-comments     pt was unable to recall 2 of 3 words  Language- name 2 objects   0 0 0  Language- repeat '1 1 1 1 1  '$ Language- follow 3 step command   0 3 3  Language- read & follow direction   0 0 0  Write a sentence   0 0 0  Copy design   0 0 0  Total score   '17 20 18        '$ 05/05/2022    8:37 AM  6CIT Screen  What Year? 0 points  What month? 0 points  What time? 0 points  Count back from 20 0 points  Months in reverse 0 points  Repeat phrase 0 points  Total Score 0 points    Immunizations Immunization History  Administered Date(s) Administered   Fluad Quad(high Dose 65+) 12/01/2020   Influenza Whole 07/09/2008, 06/26/2013   Influenza, High Dose Seasonal PF 06/09/2018   Influenza, Seasonal, Injecte, Preservative Fre 06/20/2019   Influenza,inj,Quad PF,6+ Mos 07/30/2014, 08/09/2016   Influenza-Unspecified 05/22/2017   PFIZER(Purple Top)SARS-COV-2 Vaccination 11/22/2019, 12/17/2019, 07/09/2020   Pneumococcal Conjugate-13 03/08/2017   Pneumococcal Polysaccharide-23 03/14/2018   Tdap 10/29/2013   Zoster, Live 07/30/2014    TDAP status: Up to date  Flu Vaccine status: Up to  date  Pneumococcal vaccine status: Up to date  Covid-19 vaccine status: Completed vaccines  Qualifies for Shingles Vaccine? Yes   Zostavax completed No   Shingrix Completed?: No.    Education has been provided regarding the importance of this vaccine. Patient has been advised to call insurance company to determine out of pocket expense if they have not yet received this vaccine. Advised may also receive vaccine at local pharmacy or Health Dept. Verbalized acceptance and understanding.  Screening Tests Health Maintenance  Topic Date Due   INFLUENZA VACCINE  04/26/2022   COVID-19 Vaccine (4 - Pfizer series) 05/21/2022 (Originally 09/03/2020)   Zoster Vaccines- Shingrix (1 of 2) 08/05/2022 (Originally 03/02/2002)  COLONOSCOPY (Pts 45-69yr Insurance coverage will need to be confirmed)  05/06/2023 (Originally 03/02/1997)   COLON CANCER SCREENING ANNUAL FOBT  05/11/2022   TETANUS/TDAP  10/30/2023   Pneumonia Vaccine 70 Years old  Completed   Hepatitis C Screening  Completed   HPV VACCINES  Aged Out    Health Maintenance  Health Maintenance Due  Topic Date Due   INFLUENZA VACCINE  04/26/2022    Colorectal cancer screening: Referral to GI placed Patient deferred. Pt aware the office will call re: appt.  Lung Cancer Screening: (Low Dose CT Chest recommended if Age 70-80years, 30 pack-year currently smoking OR have quit w/in 15years.) does not qualify.     Additional Screening:  Hepatitis C Screening: does qualify; Completed 02/29/16  Vision Screening: Recommended annual ophthalmology exams for early detection of glaucoma and other disorders of the eye. Is the patient up to date with their annual eye exam?  Yes  Who is the provider or what is the name of the office in which the patient attends annual eye exams? Dr MCelene SquibbIf pt is not established with a provider, would they like to be referred to a provider to establish care? No .   Dental Screening: Recommended annual dental exams  for proper oral hygiene  Community Resource Referral / Chronic Care Management:  CRR required this visit?  No   CCM required this visit?  No      Plan:     I have personally reviewed and noted the following in the patient's chart:   Medical and social history Use of alcohol, tobacco or illicit drugs  Current medications and supplements including opioid prescriptions. Patient is not currently taking opioid prescriptions. Functional ability and status Nutritional status Physical activity Advanced directives List of other physicians Hospitalizations, surgeries, and ER visits in previous 12 months Vitals Screenings to include cognitive, depression, and falls Referrals and appointments  In addition, I have reviewed and discussed with patient certain preventive protocols, quality metrics, and best practice recommendations. A written personalized care plan for preventive services as well as general preventive health recommendations were provided to patient.     BCriselda Peaches LPN   [id number]  Nurse Notes: None

## 2022-05-05 NOTE — Patient Instructions (Addendum)
[name] , Thank you for taking time to come for your Medicare Wellness Visit. I appreciate your ongoing commitment to your health goals. Please review the following plan we discussed and let me know if I can assist you in the future.   These are the goals we discussed:  Goals       Patient Stated      Starting 03/18/19, I will continue to take medications as prescribed.       Patient Stated      04/21/2020, I will maintain and continue medications as prescribed.       Patient Stated      04/28/2021, I will maintain and continue medications as prescribed.      Stay Busy (pt-stated)      Continue to take care of my Mom.      Track and Manage My Blood Pressure-Hypertension      Timeframe:  Long-Range Goal Priority:  Medium Start Date:            10/07/21                 Expected End Date:        10/07/22               Follow Up Date Jan 2024   - check blood pressure 3 times per week - choose a place to take my blood pressure (home, clinic or office, retail store) - write blood pressure results in a log or diary    Why is this important?   You won't feel high blood pressure, but it can still hurt your blood vessels.  High blood pressure can cause heart or kidney problems. It can also cause a stroke.  Making lifestyle changes like losing a little weight or eating less salt will help.  Checking your blood pressure at home and at different times of the day can help to control blood pressure.  If the doctor prescribes medicine remember to take it the way the doctor ordered.  Call the office if you cannot afford the medicine or if there are questions about it.     Notes:         This is a list of the screening recommended for you and due dates:  Health Maintenance  Topic Date Due   Flu Shot  04/26/2022   COVID-19 Vaccine (4 - Pfizer series) 05/21/2022*   Zoster (Shingles) Vaccine (1 of 2) 08/05/2022*   Colon Cancer Screening  05/06/2023*   Stool Blood Test  05/11/2022    Tetanus Vaccine  10/30/2023   Pneumonia Vaccine  Completed   Hepatitis C Screening: USPSTF Recommendation to screen - Ages 18-79 yo.  Completed   HPV Vaccine  Aged Out  *Topic was postponed. The date shown is not the original due date.   Advanced directives: Yes  Conditions/risks identified: None  Next appointment: Follow up in one year for your annual wellness visit.    Preventive Care 28 Years and Older, Male Preventive care refers to lifestyle choices and visits with your health care provider that can promote health and wellness. What does preventive care include? A yearly physical exam. This is also called an annual well check. Dental exams once or twice a year. Routine eye exams. Ask your health care provider how often you should have your eyes checked. Personal lifestyle choices, including: Daily care of your teeth and gums. Regular physical activity. Eating a healthy diet. Avoiding tobacco and drug use. Limiting alcohol use.  Practicing safe sex. Taking low doses of aspirin every day. Taking vitamin and mineral supplements as recommended by your health care provider. What happens during an annual well check? The services and screenings done by your health care provider during your annual well check will depend on your age, overall health, lifestyle risk factors, and family history of disease. Counseling  Your health care provider may ask you questions about your: Alcohol use. Tobacco use. Drug use. Emotional well-being. Home and relationship well-being. Sexual activity. Eating habits. History of falls. Memory and ability to understand (cognition). Work and work Statistician. Screening  You may have the following tests or measurements: Height, weight, and BMI. Blood pressure. Lipid and cholesterol levels. These may be checked every 5 years, or more frequently if you are over 79 years old. Skin check. Lung cancer screening. You may have this screening every year  starting at age 74 if you have a 30-pack-year history of smoking and currently smoke or have quit within the past 15 years. Fecal occult blood test (FOBT) of the stool. You may have this test every year starting at age 50. Flexible sigmoidoscopy or colonoscopy. You may have a sigmoidoscopy every 5 years or a colonoscopy every 10 years starting at age 64. Prostate cancer screening. Recommendations will vary depending on your family history and other risks. Hepatitis C blood test. Hepatitis B blood test. Sexually transmitted disease (STD) testing. Diabetes screening. This is done by checking your blood sugar (glucose) after you have not eaten for a while (fasting). You may have this done every 1-3 years. Abdominal aortic aneurysm (AAA) screening. You may need this if you are a current or former smoker. Osteoporosis. You may be screened starting at age 21 if you are at high risk. Talk with your health care provider about your test results, treatment options, and if necessary, the need for more tests. Vaccines  Your health care provider may recommend certain vaccines, such as: Influenza vaccine. This is recommended every year. Tetanus, diphtheria, and acellular pertussis (Tdap, Td) vaccine. You may need a Td booster every 10 years. Zoster vaccine. You may need this after age 97. Pneumococcal 13-valent conjugate (PCV13) vaccine. One dose is recommended after age 68. Pneumococcal polysaccharide (PPSV23) vaccine. One dose is recommended after age 33. Talk to your health care provider about which screenings and vaccines you need and how often you need them. This information is not intended to replace advice given to you by your health care provider. Make sure you discuss any questions you have with your health care provider. Document Released: 10/09/2015 Document Revised: 06/01/2016 Document Reviewed: 07/14/2015 Elsevier Interactive Patient Education  2017 [employer] Prevention in the  Home Falls can cause injuries. They can happen to people of all ages. There are many things you can do to make your home safe and to help prevent falls. What can I do on the outside of my home? Regularly fix the edges of walkways and driveways and fix any cracks. Remove anything that might make you trip as you walk through a door, such as a raised step or threshold. Trim any bushes or trees on the path to your home. Use bright outdoor lighting. Clear any walking paths of anything that might make someone trip, such as rocks or tools. Regularly check to see if handrails are loose or broken. Make sure that both sides of any steps have handrails. Any raised decks and porches should have guardrails on the edges. Have any leaves, snow, or ice  cleared regularly. Use sand or salt on walking paths during winter. Clean up any spills in your garage right away. This includes oil or grease spills. What can I do in the bathroom? Use night lights. Install grab bars by the toilet and in the tub and shower. Do not use towel bars as grab bars. Use non-skid mats or decals in the tub or shower. If you need to sit down in the shower, use a plastic, non-slip stool. Keep the floor dry. Clean up any water that spills on the floor as soon as it happens. Remove soap buildup in the tub or shower regularly. Attach bath mats securely with double-sided non-slip rug tape. Do not have throw rugs and other things on the floor that can make you trip. What can I do in the bedroom? Use night lights. Make sure that you have a light by your bed that is easy to reach. Do not use any sheets or blankets that are too big for your bed. They should not hang down onto the floor. Have a firm chair that has side arms. You can use this for support while you get dressed. Do not have throw rugs and other things on the floor that can make you trip. What can I do in the kitchen? Clean up any spills right away. Avoid walking on wet  floors. Keep items that you use a lot in easy-to-reach places. If you need to reach something above you, use a strong step stool that has a grab bar. Keep electrical cords out of the way. Do not use floor polish or wax that makes floors slippery. If you must use wax, use non-skid floor wax. Do not have throw rugs and other things on the floor that can make you trip. What can I do with my stairs? Do not leave any items on the stairs. Make sure that there are handrails on both sides of the stairs and use them. Fix handrails that are broken or loose. Make sure that handrails are as long as the stairways. Check any carpeting to make sure that it is firmly attached to the stairs. Fix any carpet that is loose or worn. Avoid having throw rugs at the top or bottom of the stairs. If you do have throw rugs, attach them to the floor with carpet tape. Make sure that you have a light switch at the top of the stairs and the bottom of the stairs. If you do not have them, ask someone to add them for you. What else can I do to help prevent falls? Wear shoes that: Do not have high heels. Have rubber bottoms. Are comfortable and fit you well. Are closed at the toe. Do not wear sandals. If you use a stepladder: Make sure that it is fully opened. Do not climb a closed stepladder. Make sure that both sides of the stepladder are locked into place. Ask someone to hold it for you, if possible. Clearly mark and make sure that you can see: Any grab bars or handrails. First and last steps. Where the edge of each step is. Use tools that help you move around (mobility aids) if they are needed. These include: Canes. Walkers. Scooters. Crutches. Turn on the lights when you go into a dark area. Replace any light bulbs as soon as they burn out. Set up your furniture so you have a clear path. Avoid moving your furniture around. If any of your floors are uneven, fix them. If there are any pets around  you, be aware of  where they are. Review your medicines with your doctor. Some medicines can make you feel dizzy. This can increase your chance of falling. Ask your doctor what other things that you can do to help prevent falls. This information is not intended to replace advice given to you by your health care provider. Make sure you discuss any questions you have with your health care provider. Document Released: 07/09/2009 Document Revised: 02/18/2016 Document Reviewed: 10/17/2014 Elsevier Interactive Patient Education  2017 [employer].

## 2022-05-09 ENCOUNTER — Encounter: Payer: Self-pay | Admitting: Family Medicine

## 2022-05-09 ENCOUNTER — Ambulatory Visit (INDEPENDENT_AMBULATORY_CARE_PROVIDER_SITE_OTHER): Payer: Medicare Other | Admitting: Family Medicine

## 2022-05-09 VITALS — BP 136/82 | HR 70 | Temp 97.0°F | Ht 68.75 in | Wt 192.0 lb

## 2022-05-09 DIAGNOSIS — Z7189 Other specified counseling: Secondary | ICD-10-CM | POA: Diagnosis not present

## 2022-05-09 DIAGNOSIS — R972 Elevated prostate specific antigen [PSA]: Secondary | ICD-10-CM

## 2022-05-09 DIAGNOSIS — N401 Enlarged prostate with lower urinary tract symptoms: Secondary | ICD-10-CM | POA: Diagnosis not present

## 2022-05-09 DIAGNOSIS — Z1211 Encounter for screening for malignant neoplasm of colon: Secondary | ICD-10-CM | POA: Diagnosis not present

## 2022-05-09 DIAGNOSIS — R351 Nocturia: Secondary | ICD-10-CM

## 2022-05-09 DIAGNOSIS — Z87891 Personal history of nicotine dependence: Secondary | ICD-10-CM

## 2022-05-09 DIAGNOSIS — Z0000 Encounter for general adult medical examination without abnormal findings: Secondary | ICD-10-CM | POA: Diagnosis not present

## 2022-05-09 DIAGNOSIS — F319 Bipolar disorder, unspecified: Secondary | ICD-10-CM

## 2022-05-09 DIAGNOSIS — F250 Schizoaffective disorder, bipolar type: Secondary | ICD-10-CM

## 2022-05-09 DIAGNOSIS — F1090 Alcohol use, unspecified, uncomplicated: Secondary | ICD-10-CM

## 2022-05-09 DIAGNOSIS — I10 Essential (primary) hypertension: Secondary | ICD-10-CM

## 2022-05-09 DIAGNOSIS — M8588 Other specified disorders of bone density and structure, other site: Secondary | ICD-10-CM | POA: Diagnosis not present

## 2022-05-09 LAB — POC URINALSYSI DIPSTICK (AUTOMATED)
Bilirubin, UA: NEGATIVE
Blood, UA: NEGATIVE
Glucose, UA: NEGATIVE
Ketones, UA: NEGATIVE
Leukocytes, UA: NEGATIVE
Nitrite, UA: NEGATIVE
Protein, UA: NEGATIVE
Spec Grav, UA: 1.015 (ref 1.010–1.025)
Urobilinogen, UA: 0.2 E.U./dL
pH, UA: 7 (ref 5.0–8.0)

## 2022-05-09 MED ORDER — TAMSULOSIN HCL 0.4 MG PO CAPS: 0.4000 mg | ORAL_CAPSULE | Freq: Every day | ORAL | 3 refills | Status: DC

## 2022-05-09 MED ORDER — AMLODIPINE BESYLATE 2.5 MG PO TABS: 2.5000 mg | ORAL_TABLET | Freq: Every day | ORAL | 3 refills | Status: DC

## 2022-05-09 NOTE — Assessment & Plan Note (Signed)
Preventative protocols reviewed and updated unless pt declined. Discussed healthy diet and lifestyle.  

## 2022-05-09 NOTE — Patient Instructions (Addendum)
Prostate blood test was elevated - check urinalysis today. Schedule lab visit in 1 month to recheck prostate blood test.  We will reorder DEXA scan - call [facility] at [phone] to schedule bone density scan, let me know if any trouble scheduling.  We will sign you up for Cologuard.  Send me date of latest COVID shot and to update your chart.  Slowly decrease alcohol intake. Let me know if any difficulty coming off this.  If interested, check with pharmacy about new 2 shot shingles series (shingrix).  Good to see you today.  Return in 6 months for follow up visit.   Health Maintenance After Age 24 After age 5, you are at a higher risk for certain long-term diseases and infections as well as injuries from falls. Falls are a major cause of broken bones and head injuries in people who are older than age 59. Getting regular preventive care can help to keep you healthy and well. Preventive care includes getting regular testing and making lifestyle changes as recommended by your health care provider. Talk with your health care provider about: Which screenings and tests you should have. A screening is a test that checks for a disease when you have no symptoms. A diet and exercise plan that is right for you. What should I know about screenings and tests to prevent falls? Screening and testing are the best ways to find a health problem early. Early diagnosis and treatment give you the best chance of managing medical conditions that are common after age 45. Certain conditions and lifestyle choices may make you more likely to have a fall. Your health care provider may recommend: Regular vision checks. Poor vision and conditions such as cataracts can make you more likely to have a fall. If you wear glasses, make sure to get your prescription updated if your vision changes. Medicine review. Work with your health care provider to regularly review all of the medicines you are taking, including over-the-counter  medicines. Ask your health care provider about any side effects that may make you more likely to have a fall. Tell your health care provider if any medicines that you take make you feel dizzy or sleepy. Strength and balance checks. Your health care provider may recommend certain tests to check your strength and balance while standing, walking, or changing positions. Foot health exam. Foot pain and numbness, as well as not wearing proper footwear, can make you more likely to have a fall. Screenings, including: Osteoporosis screening. Osteoporosis is a condition that causes the bones to get weaker and break more easily. Blood pressure screening. Blood pressure changes and medicines to control blood pressure can make you feel dizzy. Depression screening. You may be more likely to have a fall if you have a fear of falling, feel depressed, or feel unable to do activities that you used to do. Alcohol use screening. Using too much alcohol can affect your balance and may make you more likely to have a fall. Follow these instructions at home: Lifestyle Do not drink alcohol if: Your health care provider tells you not to drink. If you drink alcohol: Limit how much you have to: 0-1 drink a day for women. 0-2 drinks a day for men. Know how much alcohol is in your drink. In the [place] one drink equals one 12 oz bottle of beer (355 mL), one 5 oz glass of wine (148 mL), or one 1 oz glass of hard liquor (44 mL). Do not use any products that  contain nicotine or tobacco. These products include cigarettes, chewing tobacco, and vaping devices, such as e-cigarettes. If you need help quitting, ask your health care provider. Activity  Follow a regular exercise program to stay fit. This will help you maintain your balance. Ask your health care provider what types of exercise are appropriate for you. If you need a cane or walker, use it as recommended by your health care provider. Wear supportive shoes that have nonskid  soles. Safety  Remove any tripping hazards, such as rugs, cords, and clutter. Install safety equipment such as grab bars in bathrooms and safety rails on stairs. Keep rooms and walkways well-lit. General instructions Talk with your health care provider about your risks for falling. Tell your health care provider if: You fall. Be sure to tell your health care provider about all falls, even ones that seem minor. You feel dizzy, tiredness (fatigue), or off-balance. Take over-the-counter and prescription medicines only as told by your health care provider. These include supplements. Eat a healthy diet and maintain a healthy weight. A healthy diet includes low-fat dairy products, low-fat (lean) meats, and fiber from whole grains, beans, and lots of fruits and vegetables. Stay current with your vaccines. Schedule regular health, dental, and eye exams. Summary Having a healthy lifestyle and getting preventive care can help to protect your health and wellness after age 63. Screening and testing are the best way to find a health problem early and help you avoid having a fall. Early diagnosis and treatment give you the best chance for managing medical conditions that are more common for people who are older than age 45. Falls are a major cause of broken bones and head injuries in people who are older than age 39. Take precautions to prevent a fall at home. Work with your health care provider to learn what changes you can make to improve your health and wellness and to prevent falls. This information is not intended to replace advice given to you by your health care provider. Make sure you discuss any questions you have with your health care provider. Document Revised: 02/01/2021 Document Reviewed: 02/01/2021 Elsevier Patient Education  [employer].

## 2022-05-09 NOTE — Progress Notes (Unsigned)
Patient ID: Robert Fowler, male    DOB: 1951-07-19, 71 y.o.   MRN: [id number]  This visit was conducted in person.  BP 136/82   Pulse 70   Temp (!) 97 F (36.1 C) (Temporal)   Ht 5' 8.75" (1.746 m)   Wt 192 lb (87.1 kg)   SpO2 96%   BMI 28.56 kg/m    CC: CPE Subjective:   HPI: Robert Fowler is a 71 y.o. male presenting on 05/09/2022 for Annual Exam [facility].  C/o occasional diarrhea. )   Brother passed away Dec 26, 2021. He had alcoholic liver cirrhosis.  Mother broke several bones recently - he is her caregiver.  Saw health advisor last week for medicare wellness visit. Note reviewed.    No results found.  Flowsheet Row Clinical Support from 05/05/2022 in [place] at [facility]  PHQ-2 Total Score 3          05/05/2022    8:34 AM 04/28/2021    2:00 PM 04/21/2020   11:19 AM 03/18/2019   10:26 AM 03/14/2018    8:32 AM  Fall Risk   Falls in the past year? '1 1 1 '$ 0 Yes  Comment   tripped on carpet  per pt, multiple falls after behavioral health hospitalization  Number falls in past yr: 0 0 0  2 or more  Injury with Fall? 0 0 0  No  Risk for fall due to : No Fall Risks Medication side effect No Fall Risks  History of fall(s)  Follow up  Falls evaluation completed;Falls prevention discussed Falls evaluation completed;Falls prevention discussed    Seen last month at [facility] with dx UTI treated with cipro '250mg'$  BID x 7 days. Symptoms have resolved.  Sees Dr Josph Macho psychiatrist at [facility]. Continues depakote and fluoxetine '20mg'$  and zyprexa 5/'10mg'$  daily, hydroxyzine PRN.    Preventative: Colon cancer screening - discussed - will refer for cologuard  Prostate cancer screening - would like to continue screening. BPH on DRE 12/2019. Nocturia x2, on flomax.  Osteoporosis DEXA -2.8 lumbar (03/2015) - fosamax started 03/2017.  Osteopenia DEXA -2.1 spine (04/2019) - fosamax stopped early 12-26-2021.  Lung cancer screening - not eligible  Flu shot yearly at [facility]  10/2019, 12-27-2019, booster 06/2020  Prevnar-13 02/2017, pneumovax 02/2018 Tdap 10/2013  zostavax - 07/2014  Shingrix - discussed - to check at pharmacy Advanced directive: Scanned into chart 02/2017. Mother Adonis Huguenin then sister Eustaquio Maize are HCPOA.  Seat belt use discussed.  Sunscreen use discussed. No changing moles on skin.  Ex smoker - quit Dec 27, 2007  Alcohol - up to 6 glasses wine daily - advised cutting down on this. Strong fmhx alcoholism.  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 Interim medical history since our last visit reviewed. Allergies and medications reviewed and updated. Outpatient Medications Prior to Visit  Medication Sig Dispense Refill   acetaminophen (TYLENOL) 500 MG tablet Take 1,000 mg by mouth every 6 (six) hours as needed for moderate pain  or headache.     alendronate (FOSAMAX) 70 MG tablet Take 1 tablet (70 mg total) by mouth once a week. Take with a full glass of water on an empty stomach. 12 tablet 3   aspirin 81 MG tablet Take 81 mg by mouth daily.     benztropine (COGENTIN) 0.5 MG tablet Take 0.5 mg by mouth at bedtime.     Calcium Citrate 333 MG TABS Take 2 tablets by mouth daily.     Cholecalciferol (VITAMIN D) 125 MCG (5000 UT) CAPS Take 1 capsule by mouth daily.     divalproex (DEPAKOTE ER) 250 MG 24 hr tablet 750 mg at bedtime.     FLUoxetine (PROZAC) 20 MG capsule Take 20 mg by mouth daily.     hydrOXYzine (ATARAX/VISTARIL) 25 MG tablet Take 1 tablet (25 mg total) by mouth 2 (two) times daily as needed for anxiety. As needed      OLANZapine (ZYPREXA) 10 MG tablet Take 1 tablet (10 mg total) by mouth at bedtime.     OLANZapine zydis (ZYPREXA) 5 MG disintegrating tablet Take 5 mg by mouth daily. Takes in mornings     amLODipine (NORVASC) 2.5 MG tablet TAKE 1 TABLET BY MOUTH ONCE A DAY 90 tablet 0   tamsulosin (FLOMAX) 0.4 MG CAPS capsule TAKE 1 CAPSULE BY MOUTH ONCE DAILY 90 capsule 0   No facility-administered medications prior to visit.     Per HPI unless specifically indicated in ROS section below Review of Systems  Constitutional:  Negative for activity change, appetite change, chills, fatigue, fever and unexpected weight change.  HENT:  Negative for hearing loss.   Eyes:  Negative for visual disturbance.  Respiratory:  Positive for shortness of breath (occ). Negative for cough, chest tightness and wheezing.   Cardiovascular:  Positive for leg swelling. Negative for chest pain and palpitations.  Gastrointestinal:  Positive for diarrhea (occ loose stools). Negative for abdominal distention, abdominal pain, blood in stool, constipation, nausea and vomiting.  Genitourinary:  Negative for difficulty urinating and hematuria.  Musculoskeletal:  Negative for arthralgias, myalgias and neck pain.  Skin:  Negative for rash.  Neurological:  Positive for dizziness (occ). Negative for seizures, syncope and headaches.  Hematological:  Negative for adenopathy. Does not bruise/bleed easily.  Psychiatric/Behavioral:  Negative for dysphoric mood. The patient is not nervous/anxious.     Objective:  BP 136/82   Pulse 70   Temp (!) 97 F (36.1 C) (Temporal)   Ht 5' 8.75" (1.746 m)   Wt 192 lb (87.1 kg)   SpO2 96%   BMI 28.56 kg/m   Wt Readings from Last 3 Encounters:  05/09/22 192 lb (87.1 kg)  05/05/22 192 lb (87.1 kg)  05/07/21 192 lb 4 oz (87.2 kg)      Physical Exam Vitals and nursing note reviewed.  Constitutional:      General: He is not in acute distress.    Appearance: Normal appearance. He is well-developed.  He is not ill-appearing.  HENT:     Head: Normocephalic and atraumatic.     Right Ear: Hearing, tympanic membrane, ear canal and external ear normal.     Left Ear: Hearing, tympanic membrane, ear canal and external ear normal.  Eyes:     General: No scleral icterus.    Extraocular Movements: Extraocular movements intact.     Conjunctiva/sclera: Conjunctivae normal.     Pupils: Pupils are equal, round, and reactive to light.  Neck:     Thyroid: No thyroid mass  or thyromegaly.  Cardiovascular:     Rate and Rhythm: Normal rate and regular rhythm.     Pulses: Normal pulses.          Radial pulses are 2+ on the right side and 2+ on the left side.     Heart sounds: Normal heart sounds. No murmur heard. Pulmonary:     Effort: Pulmonary effort is normal. No respiratory distress.     Breath sounds: Normal breath sounds. No wheezing, rhonchi or rales.  Abdominal:     General: Bowel sounds are normal. There is no distension.     Palpations: Abdomen is soft. There is no mass.     Tenderness: There is no abdominal tenderness. There is no guarding or rebound.     Hernia: No hernia is present.  Genitourinary:    Prostate: Enlarged (30gm). Not tender and no nodules present.     Rectum: Normal. No mass, tenderness, anal fissure, external hemorrhoid or internal hemorrhoid. Normal anal tone.  Musculoskeletal:        General: Normal range of motion.     Cervical back: Normal range of motion and neck supple.     Right lower leg: No edema.     Left lower leg: No edema.  Lymphadenopathy:     Cervical: No cervical adenopathy.  Skin:    General: Skin is warm and dry.     Findings: No rash.  Neurological:     General: No focal deficit present.     Mental Status: He is alert and oriented to person, place, and time.  Psychiatric:        Mood and Affect: Mood normal.        Behavior: Behavior normal.        Thought Content: Thought content normal.        Judgment: Judgment normal.       Results  for orders placed or performed in visit on 05/09/22  POCT Urinalysis Dipstick (Automated)  Result Value Ref Range   Color, UA light yellow    Clarity, UA clear    Glucose, UA Negative Negative   Bilirubin, UA negative    Ketones, UA negative    Spec Grav, UA 1.015 1.010 - 1.025   Blood, UA negative    pH, UA 7.0 5.0 - 8.0   Protein, UA Negative Negative   Urobilinogen, UA 0.2 0.2 or 1.0 E.U./dL   Nitrite, UA negative    Leukocytes, UA Negative Negative    Assessment & Plan:   Problem List Items Addressed This Visit     Health maintenance examination - Primary (Chronic)    Preventative protocols reviewed and updated unless pt declined. Discussed healthy diet and lifestyle.       Relevant Orders   POCT Urinalysis Dipstick (Automated) (Completed)   Advanced care planning/counseling discussion (Chronic)    Advanced directive: Scanned into chart 02/2017. Mother Adonis Huguenin then sister Eustaquio Maize are HCPOA.       Benign prostatic hyperplasia    Continues flomax with benefit. See below re elevated PSA      Relevant Medications   tamsulosin (FLOMAX) 0.4 MG CAPS capsule   Osteopenia    Fosamax started 03/2017, stopped early 2023.  Will order repeat DEXA scan to monitor density off fosamax.       Relevant Orders   DG Bone Density   Ex-smoker    Remains abstinent. Quit 2009. Consider lung cancer screening CT.       Habitual alcohol use  Discussed significant alcohol intake - up to 6 glasses wine/day. Discussed this is excessive, encouraged slowly cutting back to max 2 drinks per day.  Strong fmhx alcoholism, brother recently deceased from alcoholic cirrhosis.  Leukopenia and loose stools could come from alcohol, recent vitamins normal, b12 low normal - encouraged b12 replacement.       Bipolar 1 disorder ([place])    Followed by psychiatry.       Schizoaffective disorder ([place])    Followed by psychiatry      Essential hypertension    Chronic, stable on low dose amlodipine.        Relevant Medications   amLODipine (NORVASC) 2.5 MG tablet   Elevated PSA    Elevated PSA in setting of recent UTI - anticipate related. Prostate exam normal today, evidence of mild enlargement. Update UA. Recommend recheck PSA in 1 month.       Relevant Orders   PSA, total and free   Other Visit Diagnoses     Special screening for malignant neoplasms, colon       Relevant Orders   Cologuard        Meds ordered this encounter  Medications   amLODipine (NORVASC) 2.5 MG tablet    Sig: Take 1 tablet (2.5 mg total) by mouth daily.    Dispense:  90 tablet    Refill:  3   tamsulosin (FLOMAX) 0.4 MG CAPS capsule    Sig: Take 1 capsule (0.4 mg total) by mouth daily.    Dispense:  90 capsule    Refill:  3   Orders Placed This Encounter  Procedures   DG Bone Density    Standing Status:   Future    Standing Expiration Date:   05/10/2023    Order Specific Question:   Reason for Exam (SYMPTOM  OR DIAGNOSIS REQUIRED)    Answer:   osteopenia f/u    Order Specific Question:   Preferred imaging location?    Answer:   [facility]   Cologuard   PSA, total and free    Standing Status:   Future    Standing Expiration Date:   05/11/2023   POCT Urinalysis Dipstick (Automated)     Patient instructions: Prostate blood test was elevated - check urinalysis today. Schedule lab visit in 1 month to recheck prostate blood test.  We will reorder DEXA scan - call [facility] at [phone] to schedule bone density scan, let me know if any trouble scheduling.  We will sign you up for Cologuard.  Send me date of latest COVID shot and to update your chart.  Slowly decrease alcohol intake. Let me know if any difficulty coming off this.  If interested, check with pharmacy about new 2 shot shingles series (shingrix).  Good to see you today.  Return in 6 months for follow up visit.   Follow up plan: Return in about 6 months (around 11/09/2022) for follow up visit.  [name], MD

## 2022-05-09 NOTE — Assessment & Plan Note (Signed)
Fosamax started 03/2017, stopped early 2023.  Will order repeat DEXA scan.

## 2022-05-09 NOTE — Assessment & Plan Note (Signed)
Advanced directive: Scanned into chart 02/2017. Mother Adonis Huguenin then sister Eustaquio Maize are HCPOA.

## 2022-05-10 DIAGNOSIS — R972 Elevated prostate specific antigen [PSA]: Secondary | ICD-10-CM | POA: Insufficient documentation

## 2022-05-10 NOTE — Assessment & Plan Note (Signed)
Chronic, stable on low dose amlodipine.

## 2022-05-10 NOTE — Assessment & Plan Note (Signed)
Remains abstinent. Quit 2009. Consider lung cancer screening CT.

## 2022-05-10 NOTE — Assessment & Plan Note (Signed)
Continues flomax with benefit. See below re elevated PSA

## 2022-05-10 NOTE — Assessment & Plan Note (Addendum)
Discussed significant alcohol intake - up to 6 glasses wine/day. Discussed this is excessive, encouraged slowly cutting back to max 2 drinks per day.  Strong fmhx alcoholism, brother recently deceased from alcoholic cirrhosis.  Leukopenia and loose stools could come from alcohol, recent vitamins normal, b12 low normal - encouraged b12 replacement.

## 2022-05-10 NOTE — Assessment & Plan Note (Signed)
Followed by psychiatry 

## 2022-05-10 NOTE — Assessment & Plan Note (Addendum)
Elevated PSA in setting of recent UTI - anticipate related. Prostate exam normal today, evidence of mild enlargement. Update UA. Recommend recheck PSA in 1 month.

## 2022-05-11 ENCOUNTER — Telehealth: Payer: Self-pay | Admitting: Family Medicine

## 2022-05-11 NOTE — Telephone Encounter (Signed)
Patient called in stating that he hasn't had any luck being able to schedule a bone density scan. The number that was given to him,told him that they do not schedule the scans there either.He would like advice as what to do at this point? He would like a phone call.

## 2022-05-13 NOTE — Telephone Encounter (Signed)
Can we find the # where patient can call to schedule [place] DEXA scan? [facility] may have this.

## 2022-05-16 NOTE — Telephone Encounter (Signed)
Spoke with pt providing phn # [phone] to call [facility] to schedule bone density scan.  Pt verbalizes understanding.

## 2022-05-23 ENCOUNTER — Ambulatory Visit (INDEPENDENT_AMBULATORY_CARE_PROVIDER_SITE_OTHER)
Admission: RE | Admit: 2022-05-23 | Discharge: 2022-05-23 | Disposition: A | Discharge status: Home / Self Care | Payer: Medicare Other | Source: Ambulatory Visit | Attending: Family Medicine | Admitting: Family Medicine

## 2022-05-23 DIAGNOSIS — M8588 Other specified disorders of bone density and structure, other site: Secondary | ICD-10-CM | POA: Diagnosis not present

## 2022-05-26 DIAGNOSIS — M8588 Other specified disorders of bone density and structure, other site: Secondary | ICD-10-CM | POA: Diagnosis not present

## 2022-05-28 ENCOUNTER — Other Ambulatory Visit: Payer: Self-pay | Admitting: Family Medicine

## 2022-05-28 DIAGNOSIS — M8588 Other specified disorders of bone density and structure, other site: Secondary | ICD-10-CM

## 2022-06-09 ENCOUNTER — Other Ambulatory Visit (INDEPENDENT_AMBULATORY_CARE_PROVIDER_SITE_OTHER): Payer: Medicare Other

## 2022-06-09 DIAGNOSIS — R972 Elevated prostate specific antigen [PSA]: Secondary | ICD-10-CM

## 2022-06-13 LAB — PSA, TOTAL AND FREE
PSA, % Free: 11 % (calc) — ABNORMAL LOW (ref >25)
PSA, Free: 0.4 ng/mL
PSA, Total: 3.5 ng/mL (ref <=4.0)

## 2022-07-07 DIAGNOSIS — F419 Anxiety disorder, unspecified: Secondary | ICD-10-CM | POA: Diagnosis not present

## 2022-07-07 DIAGNOSIS — F250 Schizoaffective disorder, bipolar type: Secondary | ICD-10-CM | POA: Diagnosis not present

## 2022-07-11 ENCOUNTER — Telehealth: Payer: Self-pay

## 2022-07-11 NOTE — Chronic Care Management (AMB) (Unsigned)
Chronic Care Management Pharmacy Assistant   Name: Robert Fowler  MRN: [id number] DOB: 03-21-1951   Reason for Encounter: General Adherence    Recent office visits:  05/09/22-Javier Gutierrez,MD(PCP)-[facility],labs( urine test returned reassuringly normal. Your PSA prostate test returned in normal range. We should recheck this again next year.)  Recent consult visits:  None in 6 months  Hospital visits:  None in previous 6 months  04/01/22-[facility]- cystitis ,start ciprofloxacin '250mg'$  take 1 tablet every 12 hours X 7 days   Medications: Outpatient Encounter Medications as of 07/11/2022  Medication Sig   acetaminophen (TYLENOL) 500 MG tablet Take 1,000 mg by mouth every 6 (six) hours as needed for moderate pain or headache.   amLODipine (NORVASC) 2.5 MG tablet Take 1 tablet (2.5 mg total) by mouth daily.   aspirin 81 MG tablet Take 81 mg by mouth daily.   benztropine (COGENTIN) 0.5 MG tablet Take 0.5 mg by mouth at bedtime.   Calcium Citrate 333 MG TABS Take 2 tablets by mouth daily.   Cholecalciferol (VITAMIN D) 125 MCG (5000 UT) CAPS Take 1 capsule by mouth daily.   divalproex (DEPAKOTE ER) 250 MG 24 hr tablet 750 mg at bedtime.   FLUoxetine (PROZAC) 20 MG capsule Take 20 mg by mouth daily.   hydrOXYzine (ATARAX/VISTARIL) 25 MG tablet Take 1 tablet (25 mg total) by mouth 2 (two) times daily as needed for anxiety. As needed   OLANZapine (ZYPREXA) 10 MG tablet Take 1 tablet (10 mg total) by mouth at bedtime.   OLANZapine zydis (ZYPREXA) 5 MG disintegrating tablet Take 5 mg by mouth daily. Takes in mornings   tamsulosin (FLOMAX) 0.4 MG CAPS capsule Take 1 capsule (0.4 mg total) by mouth daily.   No facility-administered encounter medications on file as of 07/11/2022.    Contacted [facility]ast[facility] on 07/13/22 for general disease state and medication adherence call.   Patient is not more than 5 days past due for refill on the following medications per chart history:  Star  Medications: Medication Name/mg Last Fill Days Supply No star medications identified    What concerns do you have about your medications? No medication concerns at this time   The patient denies side effects with their medications.   How often do you forget or accidentally miss a dose? Never  Do you use a pillbox? Yes  Patient puts out Monday thru Sunday   Are you having any problems getting your medications from your pharmacy? No  Has the cost of your medications been a concern? No  Patient reports overall his monthly cost is under 100$   Since last visit with CPP, the following interventions have been made. The patient reports he puts the Alendronate in his Thursday organizer pill box. The patient reports increasing his water intake daily.  The patient has had an ED visit since last contact. 04/01/22-cystitis- Encourage patient to decrease intake of Kool-Aid and sweet tea as this is likely irritating his urinary tract causing his symptoms and UTI.  Discussed increasing water intake to at least 64 ounces of water per day to maintain adequate hydration.-Patient discharged in stable condition.  The patient denies problems with their health. Patient reports he just had follow up with psychiatrist .Patient reports he continues to try and take the Alendronate  on Thursdays.  Patient denies concerns or questions for [name], PharmD at this time.   Counseled patient on:  Doristine Devoid job taking medications, Importance of taking medication daily without missed doses,  Benefits of adherence packaging or a pillbox, and Access to CCM team for any cost, medication or pharmacy concerns.   Care Gaps: Annual wellness visit in last year? Yes Most Recent BP reading:136/82  05/09/22  Summary of recommendations from last CCM Pharmacy visit (Date:10/07/21) Summary: -Pt endorses compliance with meds as prescribed; he denies issues; he reports his mood is stable on current regimen even with stressors  (mother, brother and sister all experiencing health issues) -He sometimes forgets alendronate dose on Thursdays -Pt had flu shot and Covid booster in the fall; contacting pharmacies for dates   Recommendations/Changes made from today's visit: - No med changes -Advised to write alendronate dose on his calendar/planner each week to help remember  Upcoming appointments: No appointments scheduled within the next 30 days.  [name], CPP notified  Avel Sensor, [place]  [phone]

## 2022-09-08 ENCOUNTER — Telehealth: Payer: Self-pay | Admitting: Family Medicine

## 2022-09-08 ENCOUNTER — Other Ambulatory Visit: Payer: Self-pay | Admitting: Family Medicine

## 2022-09-08 NOTE — Telephone Encounter (Signed)
Patient called and stated should he get back on the medication Fosamex. Call back number [phone].

## 2022-09-08 NOTE — Telephone Encounter (Addendum)
I wasn't aware he stopped fosamax. Can we get more details?

## 2022-09-09 NOTE — Telephone Encounter (Addendum)
Looks like pt didn't read mychart message from latest DEXA scan result 04/2022 - plz notify:  Your bone density scan returned overall stable, showing osteopenia but without osteoporosis. Recommend getting '1200mg'$  calcium daily, ideally most from the diet. Also recommend regular vitamin D - may continue your 5000 units daily, and regular weight bearing exercise. I think we can stay off fosamax at this time. We can repeat bone density scan in another 3 years to monitor off medicine.

## 2022-09-09 NOTE — Telephone Encounter (Signed)
See 09/08/22 phn note.

## 2022-09-09 NOTE — Telephone Encounter (Signed)
Lvm asking pt to call back. Need to relaying Dr. Synthia Innocent message and find out why pt stopped med.

## 2022-09-09 NOTE — Telephone Encounter (Signed)
Noted. (See 09/08/22 refill note)

## 2022-09-09 NOTE — Telephone Encounter (Signed)
Patient called back in and relayed Dr. Synthia Innocent message below. He stated that he is taking 7-8 pills in the morning and at night. He stated that this particular medication was a once a week pill and he was forgetting to take it. He stated that if he can't get it refilled now he will wait until his next appointment with Dr. Darnell Level.

## 2022-09-09 NOTE — Telephone Encounter (Signed)
Lvm asking pt to call back. Need to relay Dr. Synthia Innocent message, especially part instructing pt to stay off Fosamax at this time.

## 2022-09-09 NOTE — Telephone Encounter (Signed)
Pt instructed to stay off Fosamax at this time. (See Imaging, 05/23/22, DEXA Result Notes.)

## 2022-09-12 NOTE — Telephone Encounter (Signed)
Lvm asking pt to call back. Need to relay Dr. Synthia Innocent message, especially part instructing pt to stay off Fosamax at this time.

## 2022-09-13 NOTE — Telephone Encounter (Signed)
Lvm asking pt to call back. Need to relay Dr. Synthia Innocent message, especially part instructing pt to stay off Fosamax at this time.   Mailing a letter.

## 2022-09-13 NOTE — Telephone Encounter (Signed)
Spoke with pt relaying Dr. Synthia Innocent message. Pt verbalizes understanding. I also mentioned this info was mailed to him. Pt expresses his thanks.

## 2022-09-13 NOTE — Telephone Encounter (Signed)
Patient returned call. I did relay the part of the message that he is to stay off of the fosamax medication at this time.Patient would like a phone call.

## 2022-10-05 ENCOUNTER — Telehealth: Payer: Self-pay

## 2022-10-05 NOTE — Progress Notes (Unsigned)
   Care Guide Note  10/05/2022 Name: Raylee Adamec MRN: [id number] DOB: Jan 09, 1951  Referred by: [name], MD Reason for referral : Care Coordination (Outreach to schedule with [facility] pharmacist Catie )   Crockett Rallo is a 72 y.o. year old male who is a primary care patient of [name], MD. Jaja Switalski was referred to the pharmacist for assistance related to DM.    An unsuccessful telephone outreach was attempted today to contact the patient who was referred to the pharmacy team for assistance with medication management. Additional attempts will be made to contact the patient.   Noreene Larsson, [place], [place] [address] Direct Dial: [phone] [email or web address]

## 2022-10-06 ENCOUNTER — Telehealth: Payer: Medicare Other

## 2022-10-07 NOTE — Progress Notes (Signed)
   Care Guide Note  10/07/2022 Name: Alvar Malinoski MRN: [id number] DOB: Apr 23, 1951  Referred by: [name], MD Reason for referral : Care Coordination (Outreach to schedule with [facility] pharmacist Catie )   Raoul Ciano is a 72 y.o. year old male who is a primary care patient of [name], MD. Billy Turvey was referred to the pharmacist for assistance related to DM.    A second unsuccessful telephone outreach was attempted today to contact the patient who was referred to the pharmacy team for assistance with medication management. Additional attempts will be made to contact the patient.  Noreene Larsson, [place], [place] [address] Direct Dial: [phone] Caliph [email or web address]

## 2022-10-10 NOTE — Progress Notes (Signed)
   Care Guide Note  10/10/2022 Name: Robert Fowler MRN: [id number] DOB: September 21, 1951  Referred by: [name], MD Reason for referral : Care Coordination (Outreach to schedule with [facility] pharmacist Catie, per cx note on pt appt [place] can't see pt )   Robert Fowler is a 72 y.o. year old male who is a primary care patient of [name], MD. [name] was referred to the pharmacist for assistance related to DM.    Successful contact was made with the patient to discuss pharmacy services including being ready for the pharmacist to call at least 5 minutes before the scheduled appointment time, to have medication bottles and any blood sugar or blood pressure readings ready for review. The patient agreed to meet with the pharmacist via with the pharmacist via telephone visit on (date/time).  10/31/2022 Noreene Larsson, [place], [place] [address] Direct Dial: [phone] [email or web address]

## 2022-10-13 DIAGNOSIS — F250 Schizoaffective disorder, bipolar type: Secondary | ICD-10-CM | POA: Diagnosis not present

## 2022-10-13 DIAGNOSIS — F419 Anxiety disorder, unspecified: Secondary | ICD-10-CM | POA: Diagnosis not present

## 2022-10-26 ENCOUNTER — Telehealth: Payer: Self-pay

## 2022-10-26 NOTE — Progress Notes (Signed)
   Care Guide Note  10/26/2022 Name: Jayron Maqueda MRN: [id number] DOB: [birth year]  Referred by: [name], MD Reason for referral : Care Coordination (Outreach to reschedule with pharm d to March after PCP appt )   Pepe Mineau is a 72 y.o. year old male who is a primary care patient of [name], MD. Nihal Marzella was referred to the pharmacist for assistance related to DM.    An unsuccessful telephone outreach was attempted today to contact the patient who was referred to the pharmacy team for assistance with medication assistance. Additional attempts will be made to contact the patient.   Noreene Larsson, [place], [place] [address] Direct Dial: [phone] [email or web address]

## 2022-10-28 NOTE — Progress Notes (Signed)
   Care Guide Note  10/28/2022 Name: Robert Fowler MRN: [id number] DOB: Jan 19, 1951  Referred by: [name], MD Reason for referral : Care Coordination (Outreach to reschedule with pharm d to March after PCP appt )   Robert Fowler is a 72 y.o. year old male who is a primary care patient of [name], MD. Robert Fowler was referred to the pharmacist for assistance related to DM.    Successful contact was made with the patient to discuss pharmacy services including being ready for the pharmacist to call at least 5 minutes before the scheduled appointment time, to have medication bottles and any blood sugar or blood pressure readings ready for review. The patient agreed to meet with the pharmacist via with the pharmacist via telephone visit on (date/time).  12/01/2022  Noreene Larsson, [place], [place] [address] Direct Dial: [phone] [email or web address]

## 2022-10-31 ENCOUNTER — Other Ambulatory Visit: Payer: Self-pay | Admitting: Pharmacist

## 2022-11-09 ENCOUNTER — Encounter: Payer: Self-pay | Admitting: Family Medicine

## 2022-11-09 ENCOUNTER — Ambulatory Visit (INDEPENDENT_AMBULATORY_CARE_PROVIDER_SITE_OTHER): Payer: Medicare Other | Admitting: Family Medicine

## 2022-11-09 VITALS — BP 130/80 | HR 68 | Temp 97.5°F | Ht 68.75 in | Wt 199.4 lb

## 2022-11-09 DIAGNOSIS — F319 Bipolar disorder, unspecified: Secondary | ICD-10-CM

## 2022-11-09 DIAGNOSIS — M8588 Other specified disorders of bone density and structure, other site: Secondary | ICD-10-CM | POA: Diagnosis not present

## 2022-11-09 DIAGNOSIS — Z636 Dependent relative needing care at home: Secondary | ICD-10-CM | POA: Insufficient documentation

## 2022-11-09 DIAGNOSIS — F1090 Alcohol use, unspecified, uncomplicated: Secondary | ICD-10-CM

## 2022-11-09 DIAGNOSIS — F250 Schizoaffective disorder, bipolar type: Secondary | ICD-10-CM

## 2022-11-09 MED ORDER — CALCIUM CITRATE 333 MG PO TABS: 1.0000 | ORAL_TABLET | Freq: Every day | ORAL | Status: DC

## 2022-11-09 NOTE — Assessment & Plan Note (Signed)
Caring for mother of advanced age, possible dementia. Support provided.

## 2022-11-09 NOTE — Assessment & Plan Note (Signed)
Continue to encourage decreased alcohol intake - reviewing long term health risks of habitual alcohol use.

## 2022-11-09 NOTE — Patient Instructions (Addendum)
[place] to see if you need a new kit [phone].  Continue working on cutting down on alcohol.  Reach out to psychiatrist about how you're feeling.  Return in 6 months for physical/wellness visit.

## 2022-11-09 NOTE — Assessment & Plan Note (Signed)
Sees psychiatry. Notes worsening depressed mood around caregiver strain.  Support provided - rec touch base with Dr Josph Macho psychiatrist to discuss med management - he previously declined increased prozac dose but is now interested.

## 2022-11-09 NOTE — Assessment & Plan Note (Addendum)
Latest DEXA overall reassuring, improved.  We stopped fosamax. Encouraged good dietary calcium intake.  He continues calcium and vitamin D daily.

## 2022-11-09 NOTE — Progress Notes (Addendum)
Patient ID: Robert Fowler, male    DOB: 08-14-1951, 72 y.o.   MRN: [id number]  This visit was conducted in person.  BP 130/80   Pulse 68   Temp (!) 97.5 F (36.4 C) (Temporal)   Ht 5' 8.75" (1.746 m)   Wt 199 lb 6 oz (90.4 kg)   SpO2 96%   BMI 29.66 kg/m    CC: 6 mo f/u visit  Subjective:   HPI: Robert Fowler is a 72 y.o. 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   He didn't complete cologuard - will check with [employer]  Osteopenia - latest DEXA 04/2022 - T -1.6 L spine, not at increased fracture risk. Now off fosamax.   Alcohol use - previously drinking up to 6 glasses of wine/day. Continues drinking 6 glasses of wine/day.   Saw psychiatry Dr Josph Macho last month - offered to increase prozac dose but he declined. He will reach out to psychiatrist.      Relevant past medical, surgical, family and social history reviewed and updated as indicated. Interim medical history since our last visit reviewed. Allergies and medications reviewed and updated. Outpatient Medications Prior to Visit  Medication Sig Dispense Refill   acetaminophen (TYLENOL) 500 MG tablet Take 1,000 mg by mouth every 6 (six) hours as needed for moderate pain or headache.     amLODipine (NORVASC) 2.5 MG tablet Take 1 tablet (2.5 mg total) by mouth daily. 90 tablet 3   aspirin 81 MG tablet Take 81 mg by mouth daily.     benztropine (COGENTIN) 0.5 MG tablet Take 0.5 mg by mouth at bedtime.     Cholecalciferol (VITAMIN D) 125 MCG (5000 UT) CAPS Take 1 capsule by mouth daily.     divalproex (DEPAKOTE ER) 250 MG 24 hr tablet 750 mg at bedtime.     FLUoxetine (PROZAC) 20 MG capsule Take 20 mg by mouth daily.     hydrOXYzine (ATARAX/VISTARIL) 25 MG tablet Take 1 tablet (25 mg total) by mouth 2 (two) times daily as needed for anxiety. As needed     OLANZapine (ZYPREXA) 10 MG tablet Take 1  tablet (10 mg total) by mouth at bedtime.     OLANZapine zydis (ZYPREXA) 5 MG disintegrating tablet Take 5 mg by mouth daily. Takes in mornings     tamsulosin (FLOMAX) 0.4 MG CAPS capsule Take 1 capsule (0.4 mg total) by mouth daily. 90 capsule 3   Calcium Citrate 333 MG TABS Take 2 tablets by mouth daily.     No facility-administered medications prior to visit.     Per HPI unless specifically indicated in ROS section below Review of Systems  Objective:  BP 130/80   Pulse 68   Temp (!) 97.5 F (36.4 C) (Temporal)   Ht 5' 8.75" (1.746 m)   Wt 199 lb 6 oz (90.4 kg)   SpO2 96%   BMI 29.66 kg/m   Wt Readings from Last 3 Encounters:  11/09/22 199 lb 6 oz (90.4 kg)  05/09/22 192 lb (87.1 kg)  05/05/22 192 lb (87.1 kg)      Physical Exam Vitals and nursing note reviewed.  Constitutional:      Appearance: Normal appearance. He is not ill-appearing.  HENT:     Mouth/Throat:     Mouth: Mucous membranes are dry.     Pharynx: Oropharynx is clear. No  oropharyngeal exudate or posterior oropharyngeal erythema.  Eyes:     Extraocular Movements: Extraocular movements intact.     Pupils: Pupils are equal, round, and reactive to light.  Cardiovascular:     Rate and Rhythm: Normal rate and regular rhythm.     Pulses: Normal pulses.     Heart sounds: Normal heart sounds. No murmur heard. Pulmonary:     Effort: Pulmonary effort is normal. No respiratory distress.     Breath sounds: Normal breath sounds. No wheezing, rhonchi or rales.  Musculoskeletal:     Right lower leg: No edema.     Left lower leg: No edema.  Skin:    General: Skin is warm and dry.     Findings: No rash.  Neurological:     Mental Status: He is alert.  Psychiatric:        Mood and Affect: Mood normal.        Behavior: Behavior normal.       Results for orders placed or performed in visit on 06/09/22  PSA, total and free  Result Value Ref Range   PSA, Total 3.5 < OR = 4.0 ng/mL   PSA, Free 0.4 ng/mL   PSA,  % Free 11 (L) >25 % (calc)    Assessment & Plan:   Problem List Items Addressed This Visit     Osteopenia    Latest DEXA overall reassuring, improved.  We stopped fosamax. Encouraged good dietary calcium intake.  He continues calcium and vitamin D daily.       Habitual alcohol use    Continue to encourage decreased alcohol intake - reviewing long term health risks of habitual alcohol use.       Bipolar 1 disorder ([place]) - Primary    Sees psychiatry. Notes worsening depressed mood around caregiver strain.  Support provided - rec touch base with Dr Josph Macho psychiatrist to discuss med management - he previously declined increased prozac dose but is now interested.       Schizoaffective disorder [facility])   Caregiver stress    Caring for mother of advanced age, possible dementia. Support provided.        Meds ordered this encounter  Medications   Calcium Citrate 333 MG TABS    Sig: Take 1 tablet by mouth daily.    No orders of the defined types were placed in this encounter.   [facility] to see if you need a new kit [phone].  Continue working on cutting down on alcohol.  Reach out to psychiatrist about how you're feeling.  Return in 6 months for physical/wellness visit.   Follow up plan: Return in about 6 months (around 05/10/2023), or if symptoms worsen or fail to improve, for annual exam, prior fasting for blood work, medicare wellness visit.  [name], MD

## 2022-12-01 ENCOUNTER — Telehealth: Payer: Self-pay | Admitting: Pharmacist

## 2022-12-01 ENCOUNTER — Other Ambulatory Visit: Payer: Self-pay | Admitting: Pharmacist

## 2022-12-01 NOTE — Progress Notes (Signed)
Attempted to contact patient for scheduled appointment for medication management. Left HIPAA compliant message for patient to return my call at their convenience.   If future pharmacy needs noted, a new referral can be placed.   Catie Hedwig Morton, PharmD, [place], [facility] [phone]

## 2022-12-01 NOTE — Progress Notes (Signed)
Patient returned my call' called back, left voicemail to return my call at his convenience.   Catie Hedwig Morton, PharmD, [place], [facility] [phone]

## 2022-12-26 DIAGNOSIS — F419 Anxiety disorder, unspecified: Secondary | ICD-10-CM | POA: Diagnosis not present

## 2022-12-26 DIAGNOSIS — F250 Schizoaffective disorder, bipolar type: Secondary | ICD-10-CM | POA: Diagnosis not present

## 2022-12-27 DIAGNOSIS — Z79899 Other long term (current) drug therapy: Secondary | ICD-10-CM | POA: Diagnosis not present

## 2022-12-27 DIAGNOSIS — F250 Schizoaffective disorder, bipolar type: Secondary | ICD-10-CM | POA: Diagnosis not present

## 2023-02-28 DIAGNOSIS — F250 Schizoaffective disorder, bipolar type: Secondary | ICD-10-CM | POA: Diagnosis not present

## 2023-03-22 DIAGNOSIS — F250 Schizoaffective disorder, bipolar type: Secondary | ICD-10-CM | POA: Diagnosis not present

## 2023-03-22 DIAGNOSIS — F419 Anxiety disorder, unspecified: Secondary | ICD-10-CM | POA: Diagnosis not present

## 2023-04-19 ENCOUNTER — Other Ambulatory Visit: Payer: Self-pay | Admitting: Family Medicine

## 2023-04-19 DIAGNOSIS — N401 Enlarged prostate with lower urinary tract symptoms: Secondary | ICD-10-CM

## 2023-04-26 ENCOUNTER — Encounter (INDEPENDENT_AMBULATORY_CARE_PROVIDER_SITE_OTHER): Payer: Self-pay

## 2023-04-27 DIAGNOSIS — H52203 Unspecified astigmatism, bilateral: Secondary | ICD-10-CM | POA: Diagnosis not present

## 2023-05-06 ENCOUNTER — Other Ambulatory Visit: Payer: Self-pay | Admitting: Family Medicine

## 2023-05-06 DIAGNOSIS — M8588 Other specified disorders of bone density and structure, other site: Secondary | ICD-10-CM

## 2023-05-06 DIAGNOSIS — F1090 Alcohol use, unspecified, uncomplicated: Secondary | ICD-10-CM

## 2023-05-06 DIAGNOSIS — E7800 Pure hypercholesterolemia, unspecified: Secondary | ICD-10-CM

## 2023-05-06 DIAGNOSIS — F319 Bipolar disorder, unspecified: Secondary | ICD-10-CM

## 2023-05-06 DIAGNOSIS — N401 Enlarged prostate with lower urinary tract symptoms: Secondary | ICD-10-CM

## 2023-05-06 DIAGNOSIS — E785 Hyperlipidemia, unspecified: Secondary | ICD-10-CM | POA: Insufficient documentation

## 2023-05-09 ENCOUNTER — Other Ambulatory Visit (INDEPENDENT_AMBULATORY_CARE_PROVIDER_SITE_OTHER): Payer: Medicare Other

## 2023-05-09 ENCOUNTER — Ambulatory Visit (INDEPENDENT_AMBULATORY_CARE_PROVIDER_SITE_OTHER): Payer: Medicare Other

## 2023-05-09 VITALS — Ht 69.0 in | Wt 200.0 lb

## 2023-05-09 DIAGNOSIS — R351 Nocturia: Secondary | ICD-10-CM

## 2023-05-09 DIAGNOSIS — Z0000 Encounter for general adult medical examination without abnormal findings: Secondary | ICD-10-CM | POA: Diagnosis not present

## 2023-05-09 DIAGNOSIS — F319 Bipolar disorder, unspecified: Secondary | ICD-10-CM

## 2023-05-09 DIAGNOSIS — F1090 Alcohol use, unspecified, uncomplicated: Secondary | ICD-10-CM

## 2023-05-09 DIAGNOSIS — N401 Enlarged prostate with lower urinary tract symptoms: Secondary | ICD-10-CM | POA: Diagnosis not present

## 2023-05-09 DIAGNOSIS — R972 Elevated prostate specific antigen [PSA]: Secondary | ICD-10-CM

## 2023-05-09 DIAGNOSIS — M8588 Other specified disorders of bone density and structure, other site: Secondary | ICD-10-CM | POA: Diagnosis not present

## 2023-05-09 DIAGNOSIS — E7800 Pure hypercholesterolemia, unspecified: Secondary | ICD-10-CM

## 2023-05-09 LAB — CBC WITH DIFFERENTIAL/PLATELET
Basophils Absolute: 0 10*3/uL (ref 0.0–0.1)
Basophils Relative: 0.8 % (ref 0.0–3.0)
Eosinophils Absolute: 0 10*3/uL (ref 0.0–0.7)
Eosinophils Relative: 0.8 % (ref 0.0–5.0)
HCT: 43.5 % (ref 39.0–52.0)
Hemoglobin: 14.6 g/dL (ref 13.0–17.0)
Lymphocytes Relative: 24.3 % (ref 12.0–46.0)
Lymphs Abs: 0.7 10*3/uL (ref 0.7–4.0)
MCHC: 33.5 g/dL (ref 30.0–36.0)
MCV: 90 fl (ref 78.0–100.0)
Monocytes Absolute: 0.4 10*3/uL (ref 0.1–1.0)
Monocytes Relative: 14.7 % — ABNORMAL HIGH (ref 3.0–12.0)
Neutro Abs: 1.6 10*3/uL (ref 1.4–7.7)
Neutrophils Relative %: 59.4 % (ref 43.0–77.0)
Platelets: 127 10*3/uL — ABNORMAL LOW (ref 150.0–400.0)
RBC: 4.84 Mil/uL (ref 4.22–5.81)
RDW: 14.3 % (ref 11.5–15.5)
WBC: 2.7 10*3/uL — ABNORMAL LOW (ref 4.0–10.5)

## 2023-05-09 LAB — COMPREHENSIVE METABOLIC PANEL
ALT: 17 U/L (ref 0–53)
AST: 10 U/L (ref 0–37)
Albumin: 4.3 g/dL (ref 3.5–5.2)
Alkaline Phosphatase: 94 U/L (ref 39–117)
BUN: 6 mg/dL (ref 6–23)
CO2: 28 mEq/L (ref 19–32)
Calcium: 9.3 mg/dL (ref 8.4–10.5)
Chloride: 94 mEq/L — ABNORMAL LOW (ref 96–112)
Creatinine, Ser: 0.76 mg/dL (ref 0.40–1.50)
GFR: 90.64 mL/min (ref >60.00)
Glucose, Bld: 96 mg/dL (ref 70–99)
Potassium: 4 mEq/L (ref 3.5–5.1)
Sodium: 131 mEq/L — ABNORMAL LOW (ref 135–145)
Total Bilirubin: 0.7 mg/dL (ref 0.2–1.2)
Total Protein: 6.5 g/dL (ref 6.0–8.3)

## 2023-05-09 LAB — LIPID PANEL
Cholesterol: 158 mg/dL (ref 0–200)
HDL: 60.1 mg/dL (ref >39.00)
LDL Cholesterol: 76 mg/dL (ref 0–99)
NonHDL: 98.06
Total CHOL/HDL Ratio: 3
Triglycerides: 112 mg/dL (ref 0.0–149.0)
VLDL: 22.4 mg/dL (ref 0.0–40.0)

## 2023-05-09 LAB — VITAMIN B12: Vitamin B-12: 337 pg/mL (ref 211–911)

## 2023-05-09 LAB — FOLATE: Folate: 8.9 ng/mL (ref >5.9)

## 2023-05-09 LAB — PSA: PSA: 4.87 ng/mL — ABNORMAL HIGH (ref 0.10–4.00)

## 2023-05-09 LAB — VITAMIN D 25 HYDROXY (VIT D DEFICIENCY, FRACTURES): VITD: 60 ng/mL (ref 30.00–100.00)

## 2023-05-09 NOTE — Patient Instructions (Signed)
[name] , Thank you for taking time to come for your Medicare Wellness Visit. I appreciate your ongoing commitment to your health goals. Please review the following plan we discussed and let me know if I can assist you in the future.   Referrals/Orders/Follow-Ups/Clinician Recommendations: Aim for 30 minutes of exercise or brisk walking, 6-8 glasses of water, and 5 servings of fruits and vegetables each day.   This is a list of the screening recommended for you and due dates:  Health Maintenance  Topic Date Due   Colon Cancer Screening  Never done   Zoster (Shingles) Vaccine (1 of 2) 71/03/2002   Stool Blood Test  05/11/2022   COVID-19 Vaccine (5 - 2023-24 season) 09/01/2022   Medicare Annual Wellness Visit  05/06/2023   Flu Shot  04/27/2023   DTaP/Tdap/Td vaccine (2 - Td or Tdap) 10/30/2023   Pneumonia Vaccine  Completed   Hepatitis C Screening  Completed   HPV Vaccine  Aged Out    Advanced directives: (In Chart) A copy of your advanced directives are scanned into your chart should your provider ever need it.  Next Medicare Annual Wellness Visit scheduled for next year: Yes  Preventive Care 71 Years and Older, Male  Preventive care refers to lifestyle choices and visits with your health care provider that can promote health and wellness. What does preventive care include? A yearly physical exam. This is also called an annual well check. Dental exams once or twice a year. Routine eye exams. Ask your health care provider how often you should have your eyes checked. Personal lifestyle choices, including: Daily care of your teeth and gums. Regular physical activity. Eating a healthy diet. Avoiding tobacco and drug use. Limiting alcohol use. Practicing safe sex. Taking low doses of aspirin every day. Taking vitamin and mineral supplements as recommended by your health care provider. What happens during an annual well check? The services and screenings done by your health care  provider during your annual well check will depend on your age, overall health, lifestyle risk factors, and family history of disease. Counseling  Your health care provider may ask you questions about your: Alcohol use. Tobacco use. Drug use. Emotional well-being. Home and relationship well-being. Sexual activity. Eating habits. History of falls. Memory and ability to understand (cognition). Work and work Astronomer. Screening  You may have the following tests or measurements: Height, weight, and BMI. Blood pressure. Lipid and cholesterol levels. These may be checked every 5 years, or more frequently if you are over 80 years old. Skin check. Lung cancer screening. You may have this screening every year starting at age 52 if you have a 30-pack-year history of smoking and currently smoke or have quit within the past 15 years. Fecal occult blood test (FOBT) of the stool. You may have this test every year starting at age 16. Flexible sigmoidoscopy or colonoscopy. You may have a sigmoidoscopy every 5 years or a colonoscopy every 10 years starting at age 27. Prostate cancer screening. Recommendations will vary depending on your family history and other risks. Hepatitis C blood test. Hepatitis B blood test. Sexually transmitted disease (STD) testing. Diabetes screening. This is done by checking your blood sugar (glucose) after you have not eaten for a while (fasting). You may have this done every 1-3 years. Abdominal aortic aneurysm (AAA) screening. You may need this if you are a current or former smoker. Osteoporosis. You may be screened starting at age 28 if you are at high risk. Talk with  your health care provider about your test results, treatment options, and if necessary, the need for more tests. Vaccines  Your health care provider may recommend certain vaccines, such as: Influenza vaccine. This is recommended every year. Tetanus, diphtheria, and acellular pertussis (Tdap, Td)  vaccine. You may need a Td booster every 10 years. Zoster vaccine. You may need this after age 71. Pneumococcal 13-valent conjugate (PCV13) vaccine. One dose is recommended after age 71. Pneumococcal polysaccharide (PPSV23) vaccine. One dose is recommended after age 71. Talk to your health care provider about which screenings and vaccines you need and how often you need them. This information is not intended to replace advice given to you by your health care provider. Make sure you discuss any questions you have with your health care provider. Document Released: 10/09/2015 Document Revised: 06/01/2016 Document Reviewed: 07/14/2015 Elsevier Interactive Patient Education  2017 [employer].  Fall Prevention in the Home Falls can cause injuries. They can happen to people of all ages. There are many things you can do to make your home safe and to help prevent falls. What can I do on the outside of my home? Regularly fix the edges of walkways and driveways and fix any cracks. Remove anything that might make you trip as you walk through a door, such as a raised step or threshold. Trim any bushes or trees on the path to your home. Use bright outdoor lighting. Clear any walking paths of anything that might make someone trip, such as rocks or tools. Regularly check to see if handrails are loose or broken. Make sure that both sides of any steps have handrails. Any raised decks and porches should have guardrails on the edges. Have any leaves, snow, or ice cleared regularly. Use sand or salt on walking paths during winter. Clean up any spills in your garage right away. This includes oil or grease spills. What can I do in the bathroom? Use night lights. Install grab bars by the toilet and in the tub and shower. Do not use towel bars as grab bars. Use non-skid mats or decals in the tub or shower. If you need to sit down in the shower, use a plastic, non-slip stool. Keep the floor dry. Clean up any  water that spills on the floor as soon as it happens. Remove soap buildup in the tub or shower regularly. Attach bath mats securely with double-sided non-slip rug tape. Do not have throw rugs and other things on the floor that can make you trip. What can I do in the bedroom? Use night lights. Make sure that you have a light by your bed that is easy to reach. Do not use any sheets or blankets that are too big for your bed. They should not hang down onto the floor. Have a firm chair that has side arms. You can use this for support while you get dressed. Do not have throw rugs and other things on the floor that can make you trip. What can I do in the kitchen? Clean up any spills right away. Avoid walking on wet floors. Keep items that you use a lot in easy-to-reach places. If you need to reach something above you, use a strong step stool that has a grab bar. Keep electrical cords out of the way. Do not use floor polish or wax that makes floors slippery. If you must use wax, use non-skid floor wax. Do not have throw rugs and other things on the floor that can make you trip.  What can I do with my stairs? Do not leave any items on the stairs. Make sure that there are handrails on both sides of the stairs and use them. Fix handrails that are broken or loose. Make sure that handrails are as long as the stairways. Check any carpeting to make sure that it is firmly attached to the stairs. Fix any carpet that is loose or worn. Avoid having throw rugs at the top or bottom of the stairs. If you do have throw rugs, attach them to the floor with carpet tape. Make sure that you have a light switch at the top of the stairs and the bottom of the stairs. If you do not have them, ask someone to add them for you. What else can I do to help prevent falls? Wear shoes that: Do not have high heels. Have rubber bottoms. Are comfortable and fit you well. Are closed at the toe. Do not wear sandals. If you use a  stepladder: Make sure that it is fully opened. Do not climb a closed stepladder. Make sure that both sides of the stepladder are locked into place. Ask someone to hold it for you, if possible. Clearly mark and make sure that you can see: Any grab bars or handrails. First and last steps. Where the edge of each step is. Use tools that help you move around (mobility aids) if they are needed. These include: Canes. Walkers. Scooters. Crutches. Turn on the lights when you go into a dark area. Replace any light bulbs as soon as they burn out. Set up your furniture so you have a clear path. Avoid moving your furniture around. If any of your floors are uneven, fix them. If there are any pets around you, be aware of where they are. Review your medicines with your doctor. Some medicines can make you feel dizzy. This can increase your chance of falling. Ask your doctor what other things that you can do to help prevent falls. This information is not intended to replace advice given to you by your health care provider. Make sure you discuss any questions you have with your health care provider. Document Released: 07/09/2009 Document Revised: 02/18/2016 Document Reviewed: 10/17/2014 Elsevier Interactive Patient Education  2017 [employer].

## 2023-05-09 NOTE — Progress Notes (Signed)
Subjective:   Robert Fowler is a 72 y.o. male who presents for Medicare Annual/Subsequent preventive examination.  Visit Complete: Virtual  I connected with  Robert Fowler on 05/09/23 by a audio enabled telemedicine application and verified that I am speaking with the correct person using two identifiers.  Patient Location: Home  Provider Location: Home Office  I discussed the limitations of evaluation and management by telemedicine. The patient expressed understanding and agreed to proceed.  Vital Signs: Unable to obtain new vitals due to this being a telehealth visit.  Patient reported HT and WT.  Review of Systems      Cardiac Risk Factors include: advanced age (>59men, >68 women);hypertension;male gender;dyslipidemia;sedentary lifestyle;smoking/ tobacco exposure     Objective:    Today's Vitals   05/09/23 0817  Weight: 200 lb (90.7 kg)  Height: 5\' 9"  (1.753 m)   Body mass index is 29.53 kg/m.     05/09/2023    8:29 AM 05/05/2022    8:36 AM 04/28/2021    1:58 PM 04/21/2020   11:17 AM 03/18/2019   10:32 AM 04/13/2018    8:03 AM 03/14/2018    8:25 AM  Advanced Directives  Does Patient Have a Medical Advance Directive? Yes Yes Yes Yes Yes Yes Yes  Type of Estate agent of [place];Living will Healthcare Power of [place];Living will Healthcare Power of [place];Living will Healthcare Power of [place];Living will Healthcare Power of [place];Living will Healthcare Power of [place];Living will Healthcare Power of [place];Living will  Does patient want to make changes to medical advance directive? No - Patient declined No - Patient declined   No - Patient declined No - Patient declined   Copy of Healthcare Power of Attorney in Chart? Yes - validated most recent copy scanned in chart (See row information) No - copy requested Yes - validated most recent copy scanned in chart (See row information) Yes - validated most recent copy scanned in chart (See row  information) No - copy requested No - copy requested No - copy requested    Current Medications (verified) Outpatient Encounter Medications as of 05/09/2023  Medication Sig   acetaminophen (TYLENOL) 500 MG tablet Take 1,000 mg by mouth every 6 (six) hours as needed for moderate pain or headache.   aspirin 81 MG tablet Take 81 mg by mouth daily.   benztropine (COGENTIN) 0.5 MG tablet Take 0.5 mg by mouth at bedtime.   Calcium Citrate 333 MG TABS Take 1 tablet by mouth daily.   Cholecalciferol (VITAMIN D) 125 MCG (5000 UT) CAPS Take 1 capsule by mouth daily.   divalproex (DEPAKOTE ER) 250 MG 24 hr tablet 750 mg at bedtime.   FLUoxetine (PROZAC) 20 MG capsule Take 20 mg by mouth daily.   hydrOXYzine (ATARAX/VISTARIL) 25 MG tablet Take 1 tablet (25 mg total) by mouth 2 (two) times daily as needed for anxiety. As needed   OLANZapine (ZYPREXA) 10 MG tablet Take 1 tablet (10 mg total) by mouth at bedtime.   OLANZapine zydis (ZYPREXA) 5 MG disintegrating tablet Take 5 mg by mouth daily. Takes in mornings   tamsulosin (FLOMAX) 0.4 MG CAPS capsule TAKE ONE CAPSULE BY MOUTH ONCE DAILY   amLODipine (NORVASC) 2.5 MG tablet Take 1 tablet (2.5 mg total) by mouth daily. (Patient not taking: Reported on 05/09/2023)   No facility-administered encounter medications on file as of 05/09/2023.    Allergies (verified) Poison oak extract   History: Past Medical History:  Diagnosis Date   Arthritis  BENIGN PROSTATIC HYPERTROPHY 07/18/2007   Bipolar disorder (HCC)    Habitual alcohol use    Lesion of oral mucosa 03/21/2018   Biopsy 03/2018 [facility]) - INFLAMMATORY FIBROUS HYPERPLASIA CONSISTENT WITH DENTURE INJURY (EPULIS FISSURATUM).    OSTEOPOROSIS 09/04/2007   DEXA -2.8 lumbar (03/2015)   Pes planus    Prediabetes 2014   Schizoaffective disorder (HCC)    h/o paranoid schizophrenia   Wears dentures    full upper and lower   Past Surgical History:  Procedure Laterality Date   INGUINAL HERNIA REPAIR  Right 08/2014   Dr Harlon Flor   MASS EXCISION N/A 04/13/2018   Procedure: EXCISION LOWER LIP ORAL  MASS;  Surgeon: Linus Salmons, MD;  [facility];  Service: ENT;  Laterality: N/A;   Family History  Problem Relation Age of Onset   Alcohol abuse Father    Alcohol abuse Maternal Grandmother    COPD Mother        smoker   CAD Neg Hx    Stroke Neg Hx    Cancer Neg Hx    Diabetes Neg Hx    Hypertension Neg Hx    Social History   Socioeconomic History   Marital status: Single    Spouse name: Not on file   Number of children: Not on file   Years of education: Not on file   Highest education level: Not on file  Occupational History   Not on file  Tobacco Use   Smoking status: Former    Current packs/day: 0.00    Types: Cigarettes    Quit date: 09/26/2006    Years since quitting: 16.6   Smokeless tobacco: Never  Vaping Use   Vaping status: Never Used  Substance and Sexual Activity   Alcohol use: Yes    Comment: occasional   Drug use: Yes    Types: Marijuana   Sexual activity: Not Currently  Other Topics Concern   Not on file  Social History Narrative   Lives alone.     Mother and brother live nearby.   Occupation: Copy at [employer]   Edu: 12th grade   Activity: mows yard   Diet: good water, fruits/vegetables daily      Disability evaluation - no disability (04/2014)   Social Determinants of Health   Financial Resource Strain: Low Risk  (05/09/2023)   Overall Financial Resource Strain (CARDIA)    Difficulty of Paying Living Expenses: Not hard at all  Food Insecurity: No Food Insecurity (05/09/2023)   Hunger Vital Sign    Worried About Running Out of Food in the Last Year: Never true    Ran Out of Food in the Last Year: Never true  Transportation Needs: No Transportation Needs (05/09/2023)   PRAPARE - Administrator, Civil Service (Medical): No    Lack of Transportation (Non-Medical): No  Physical Activity: Inactive (05/09/2023)    Exercise Vital Sign    Days of Exercise per Week: 0 days    Minutes of Exercise per Session: 0 min  Stress: No Stress Concern Present (05/09/2023)   [employer] of Occupational Health - Occupational Stress Questionnaire    Feeling of Stress : Not at all  Social Connections: Moderately Isolated (05/09/2023)   Social Connection and Isolation Panel [NHANES]    Frequency of Communication with Friends and Family: More than three times a week    Frequency of Social Gatherings with Friends and Family: More than three times a week    Attends  Religious Services: More than 4 times per year    Active Member of Clubs or Organizations: No    Attends Banker Meetings: Never    Marital Status: Never married    Tobacco Counseling Counseling given: Not Answered   Clinical Intake:  Pre-visit preparation completed: Yes  Pain : No/denies pain     BMI - recorded: 29.53 Nutritional Status: BMI 25 -29 Overweight Nutritional Risks: None Diabetes: No  How often do you need to have someone help you when you read instructions, pamphlets, or other written materials from your doctor or pharmacy?: 1 - Never  Interpreter Needed?: No  Information entered by :: C.Faye Strohman LPN   Activities of Daily Living    05/09/2023    8:29 AM  In your present state of health, do you have any difficulty performing the following activities:  Hearing? 0  Vision? 0  Difficulty concentrating or making decisions? 0  Walking or climbing stairs? 0  Dressing or bathing? 0  Doing errands, shopping? 0  Preparing Food and eating ? N  Using the Toilet? N  In the past six months, have you accidently leaked urine? N  Do you have problems with loss of bowel control? N  Managing your Medications? N  Managing your Finances? N  Housekeeping or managing your Housekeeping? N    Patient Care Team: Eustaquio Boyden, MD as PCP - General (Family Medicine) Kathyrn Sheriff, [facility] (Inactive) as Pharmacist  (Pharmacist)  Indicate any recent Medical Services you may have received from other than [facility] providers in the past year (date may be approximate).     Assessment:   This is a routine wellness examination for [place].  Hearing/Vision screen Hearing Screening - Comments:: Has some hearing difficulties when there is background noises Vision Screening - Comments:: Glasses - UTD on eye exams - [facility]  Dietary issues and exercise activities discussed:     Goals Addressed             This Visit's Progress    Patient Stated       Exercise more       Depression Screen    05/09/2023    8:28 AM 11/09/2022    9:09 AM 05/05/2022    8:28 AM 04/28/2021    2:02 PM 04/21/2020   11:21 AM 03/18/2019   10:26 AM 03/14/2018    8:32 AM  PHQ 2/9 Scores  PHQ - 2 Score 0 2 3 0 0 0 0  PHQ- 9 Score  11 4 0 0 0 0    Fall Risk    05/09/2023    8:21 AM 11/09/2022    9:09 AM 05/05/2022    8:34 AM 04/28/2021    2:00 PM 04/21/2020   11:19 AM  Fall Risk   Falls in the past year? 1 0 1 1 1   Comment     tripped on carpet  Number falls in past yr: 0  0 0 0  Comment Tripped      Injury with Fall? 0  0 0 0  Risk for fall due to : No Fall Risks  No Fall Risks Medication side effect No Fall Risks  Follow up Falls prevention discussed;Falls evaluation completed   Falls evaluation completed;Falls prevention discussed Falls evaluation completed;Falls prevention discussed    MEDICARE RISK AT HOME:  Medicare Risk at Home - 05/09/23 0830     Any stairs in or around the home? No    If so, are there any  without handrails? No    Home free of loose throw rugs in walkways, pet beds, electrical cords, etc? Yes    Adequate lighting in your home to reduce risk of falls? Yes    Life alert? No    Use of a cane, walker or w/c? No    Grab bars in the bathroom? Yes    Shower chair or bench in shower? No    Elevated toilet seat or a handicapped toilet? Yes             TIMED UP AND GO:  Was the  test performed?  No    Cognitive Function:    04/28/2021    2:08 PM 04/21/2020   11:27 AM 03/18/2019   10:27 AM 03/14/2018    8:32 AM 03/08/2017    9:05 AM  MMSE - Mini Mental State Exam  Orientation to time 5 5 5 5 5   Orientation to Place 5 5 5 5 5   Registration 3 3 3 3 3   Attention/ Calculation 5 5 0 0 0  Recall 3 3 3 3 1   Recall-comments     pt was unable to recall 2 of 3 words  Language- name 2 objects   0 0 0  Language- repeat 1 1 1 1 1   Language- follow 3 step command   0 3 3  Language- read & follow direction   0 0 0  Write a sentence   0 0 0  Copy design   0 0 0  Total score   17 20 18         05/09/2023    8:31 AM 05/05/2022    8:37 AM  6CIT Screen  What Year? 0 points 0 points  What month? 0 points 0 points  What time? 0 points 0 points  Count back from 20 0 points 0 points  Months in reverse 0 points 0 points  Repeat phrase 4 points 0 points  Total Score 4 points 0 points    Immunizations Immunization History  Administered Date(s) Administered   Fluad Quad(high Dose 65+) 12/01/2020, 07/07/2022   Influenza Whole 07/09/2008, 06/26/2013   Influenza, High Dose Seasonal PF 06/09/2018   Influenza, Seasonal, Injecte, Preservative Fre 06/20/2019   Influenza,inj,Quad PF,6+ Mos 07/30/2014, 08/09/2016   Influenza-Unspecified 05/22/2017   PFIZER Comirnaty(Gray Top)Covid-19 Tri-Sucrose Vaccine 07/07/2022   PFIZER(Purple Top)SARS-COV-2 Vaccination 11/22/2019, 12/17/2019, 07/09/2020   Pneumococcal Conjugate-13 03/08/2017   Pneumococcal Polysaccharide-23 03/14/2018   Respiratory Syncytial Virus Vaccine,Recomb Aduvanted(Arexvy) 07/21/2022   Tdap 10/29/2013   Zoster, Live 07/30/2014    TDAP status: Up to date  Flu Vaccine status: Due, Education has been provided regarding the importance of this vaccine. Advised may receive this vaccine at local pharmacy or Health Dept. Aware to provide a copy of the vaccination record if obtained from local pharmacy or Health Dept.  Verbalized acceptance and understanding.  Pneumococcal vaccine status: Up to date  Covid-19 vaccine status: Information provided on how to obtain vaccines.   Qualifies for Shingles Vaccine? Yes   Zostavax completed Yes   Shingrix Completed?: No.    Education has been provided regarding the importance of this vaccine. Patient has been advised to call insurance company to determine out of pocket expense if they have not yet received this vaccine. Advised may also receive vaccine at local pharmacy or Health Dept. Verbalized acceptance and understanding.  Screening Tests Health Maintenance  Topic Date Due   Colonoscopy  Never done   Zoster Vaccines- Shingrix (1 of 2)  03/02/2002   COLON CANCER SCREENING ANNUAL FOBT  05/11/2022   COVID-19 Vaccine (5 - 2023-24 season) 09/01/2022   INFLUENZA VACCINE  04/27/2023   DTaP/Tdap/Td (2 - Td or Tdap) 10/30/2023   Medicare Annual Wellness (AWV)  05/08/2024   Pneumonia Vaccine 54+ Years old  Completed   Hepatitis C Screening  Completed   HPV VACCINES  Aged Out    Health Maintenance  Health Maintenance Due  Topic Date Due   Colonoscopy  Never done   Zoster Vaccines- Shingrix (1 of 2) 03/02/2002   COLON CANCER SCREENING ANNUAL FOBT  05/11/2022   COVID-19 Vaccine (5 - 2023-24 season) 09/01/2022   INFLUENZA VACCINE  04/27/2023    Colorectal cancer screening: Type of screening: FOBT/FIT. Completed 05/11/22. Repeat every DUE years  Lung Cancer Screening: (Low Dose CT Chest recommended if Age 77-80 years, 20 pack-year currently smoking OR have quit w/in 15years.) does not qualify.   Lung Cancer Screening Referral: no  Additional Screening:  Hepatitis C Screening: does qualify; Completed 02/29/16  Vision Screening: Recommended annual ophthalmology exams for early detection of glaucoma and other disorders of the eye. Is the patient up to date with their annual eye exam?  Yes  Who is the provider or what is the name of the office in which the  patient attends annual eye exams? [facility] If pt is not established with a provider, would they like to be referred to a provider to establish care? Yes .   Dental Screening: Recommended annual dental exams for proper oral hygiene    Community Resource Referral / Chronic Care Management: CRR required this visit?  No   CCM required this visit?  No     Plan:     I have personally reviewed and noted the following in the patient's chart:   Medical and social history Use of alcohol, tobacco or illicit drugs  Current medications and supplements including opioid prescriptions. Patient is not currently taking opioid prescriptions. Functional ability and status Nutritional status Physical activity Advanced directives List of other physicians Hospitalizations, surgeries, and ER visits in previous 12 months Vitals Screenings to include cognitive, depression, and falls Referrals and appointments  In addition, I have reviewed and discussed with patient certain preventive protocols, quality metrics, and best practice recommendations. A written personalized care plan for preventive services as well as general preventive health recommendations were provided to patient.     Maryan Puls, LPN   1/61/0960   After Visit Summary: (MyChart) Due to this being a telephonic visit, the after visit summary with patients personalized plan was offered to patient via MyChart   Nurse Notes: Patient declined colonoscopy and cologuard. Pt did state that he drinks alcohol 3 times a day but is not ready to quit.

## 2023-05-11 ENCOUNTER — Other Ambulatory Visit: Payer: Medicare Other

## 2023-05-11 DIAGNOSIS — R972 Elevated prostate specific antigen [PSA]: Secondary | ICD-10-CM

## 2023-05-11 NOTE — Addendum Note (Signed)
Addended by: Alvina Chou on: 05/11/2023 11:07 AM   Modules accepted: Orders

## 2023-05-12 LAB — FPSA% REFLEX
% FREE PSA: 9 %
PSA, FREE: 0.36 ng/mL

## 2023-05-12 LAB — PSA TOTAL (REFLEX TO FREE): Prostate Specific Ag, Serum: 4 ng/mL (ref 0.0–4.0)

## 2023-05-16 ENCOUNTER — Ambulatory Visit (INDEPENDENT_AMBULATORY_CARE_PROVIDER_SITE_OTHER): Payer: Medicare Other | Admitting: Family Medicine

## 2023-05-16 ENCOUNTER — Encounter: Payer: Self-pay | Admitting: Family Medicine

## 2023-05-16 VITALS — BP 144/82 | HR 68 | Temp 97.3°F | Ht 70.0 in | Wt 194.8 lb

## 2023-05-16 DIAGNOSIS — R7989 Other specified abnormal findings of blood chemistry: Secondary | ICD-10-CM

## 2023-05-16 DIAGNOSIS — Z0000 Encounter for general adult medical examination without abnormal findings: Secondary | ICD-10-CM

## 2023-05-16 DIAGNOSIS — E7800 Pure hypercholesterolemia, unspecified: Secondary | ICD-10-CM

## 2023-05-16 DIAGNOSIS — F319 Bipolar disorder, unspecified: Secondary | ICD-10-CM

## 2023-05-16 DIAGNOSIS — F250 Schizoaffective disorder, bipolar type: Secondary | ICD-10-CM

## 2023-05-16 DIAGNOSIS — N401 Enlarged prostate with lower urinary tract symptoms: Secondary | ICD-10-CM

## 2023-05-16 DIAGNOSIS — Z636 Dependent relative needing care at home: Secondary | ICD-10-CM

## 2023-05-16 DIAGNOSIS — M8588 Other specified disorders of bone density and structure, other site: Secondary | ICD-10-CM

## 2023-05-16 DIAGNOSIS — Z1211 Encounter for screening for malignant neoplasm of colon: Secondary | ICD-10-CM

## 2023-05-16 DIAGNOSIS — Z7189 Other specified counseling: Secondary | ICD-10-CM

## 2023-05-16 DIAGNOSIS — E538 Deficiency of other specified B group vitamins: Secondary | ICD-10-CM

## 2023-05-16 DIAGNOSIS — F1090 Alcohol use, unspecified, uncomplicated: Secondary | ICD-10-CM

## 2023-05-16 DIAGNOSIS — E519 Thiamine deficiency, unspecified: Secondary | ICD-10-CM

## 2023-05-16 DIAGNOSIS — R972 Elevated prostate specific antigen [PSA]: Secondary | ICD-10-CM

## 2023-05-16 DIAGNOSIS — R351 Nocturia: Secondary | ICD-10-CM

## 2023-05-16 DIAGNOSIS — I10 Essential (primary) hypertension: Secondary | ICD-10-CM

## 2023-05-16 MED ORDER — TAMSULOSIN HCL 0.4 MG PO CAPS
0.4000 mg | ORAL_CAPSULE | Freq: Every day | ORAL | 4 refills | Status: DC
Start: 2023-05-16 — End: 2024-05-17

## 2023-05-16 MED ORDER — B COMPLEX VITAMINS PO CAPS: 1.0000 | ORAL_CAPSULE | Freq: Every day | ORAL | Status: DC

## 2023-05-16 MED ORDER — AMLODIPINE BESYLATE 2.5 MG PO TABS: 2.5000 mg | ORAL_TABLET | Freq: Every day | ORAL | 4 refills | Status: DC

## 2023-05-16 NOTE — Assessment & Plan Note (Signed)
Mother now lives in nursing home. Support provided.

## 2023-05-16 NOTE — Patient Instructions (Addendum)
Start B complex vitamin We will refer you to urology for elevated PSA Pass by lab to pick up stool kit today for colon cancer screening.  Restart amlodipine 2.5mg  daily.  If interested, check with pharmacy about new 2 shot shingles series (shingrix) as well as RSV shot.  Cut down on drinking to protect health (liver, brain, nerves).  Return in 6 months for follow up visit

## 2023-05-16 NOTE — Assessment & Plan Note (Signed)
Rec start B complex MVI

## 2023-05-16 NOTE — Assessment & Plan Note (Signed)
Chronic, improved off medication - continue to monitor.  The 10-year ASCVD risk score (Arnett DK, et al., 2019) is: 22.5%   Values used to calculate the score:     Age: 72 years     Sex: Male     Is Non-Hispanic African American: No     Diabetic: No     Tobacco smoker: No     Systolic Blood Pressure: 144 mmHg     Is BP treated: Yes     HDL Cholesterol: 60.1 mg/dL     Total Cholesterol: 158 mg/dL

## 2023-05-16 NOTE — Assessment & Plan Note (Signed)
Chronic, BP above goal in setting of running out of amlodipine - refilled today.

## 2023-05-16 NOTE — Assessment & Plan Note (Signed)
Encouraged cessation - reviewing health risks of ongoing use.

## 2023-05-16 NOTE — Assessment & Plan Note (Signed)
With leukopenia and thrombocytopenia - ?related to alcohol use vs vitamin deficiency. See below.

## 2023-05-16 NOTE — Assessment & Plan Note (Signed)
Advanced directive: Scanned into chart 02/2017. Mother Maureen Ralphs then sister Waynetta Sandy are HCPOA. Needs to update this.

## 2023-05-16 NOTE — Assessment & Plan Note (Signed)
Mild PSA elevation noted presumed due to BPH with reassuring DRE however free PSA % is low - will refer to urology for recommendations.

## 2023-05-16 NOTE — Assessment & Plan Note (Signed)
Continues flomax.  DRE with enlargement.  See below re elevated PSA.

## 2023-05-16 NOTE — Assessment & Plan Note (Addendum)
Rec start B complex MVI This could contribute to memory difficulty.

## 2023-05-16 NOTE — Assessment & Plan Note (Signed)
Sees psychiatrist regularly. Continue current regimen.

## 2023-05-16 NOTE — Progress Notes (Signed)
Ph: [phone] Fax: [phone]   Patient ID: Robert Fowler, male    DOB: August 26, 1951, 72 y.o.   MRN: [id number]  This visit was conducted in person.  BP (!) 144/82   Pulse 68   Temp (!) 97.3 F (36.3 C) (Temporal)   Ht 5\' 10"  (1.778 m)   Wt 194 lb 12.8 oz (88.4 kg)   SpO2 97%   BMI 27.95 kg/m    CC: CPE Subjective:   HPI: Robert Fowler is a 72 y.o. male presenting on 05/16/2023 for Annual Exam   Saw health advisor last week for medicare wellness visit. Note reviewed.    No results found.  Flowsheet Row Office Visit from 05/16/2023 in [facility] [place]  PHQ-2 Total Score 2          05/16/2023    8:09 AM 05/09/2023    8:21 AM 11/09/2022    9:09 AM 05/05/2022    8:34 AM 04/28/2021    2:00 PM  Fall Risk   Falls in the past year? 1 1 0 1 1  Number falls in past yr: 1 0  0 0  Comment  Tripped     Injury with Fall? 0 0  0 0  Risk for fall due to : History of fall(s) No Fall Risks  No Fall Risks Medication side effect  Follow up Falls evaluation completed Falls prevention discussed;Falls evaluation completed   Falls evaluation completed;Falls prevention discussed   Continues caring for mother, she had several bone fractures after falls last year. Mother moved into nursing home 2 wks ago.  Brother passed away 12/19/2021. He had alcoholic liver cirrhosis.   Sees Dr Merlyn Albert psychiatrist at [place]. Continues depakote and fluoxetine 20mg  and zyprexa 5/10mg  daily, hydroxyzine PRN.   Brings positive COVID test from last week. Symptoms started last weekend - he's now feeling better.   He ran out of amlodipine 2 wks ago.    Preventative: Colon cancer screening - cologuard ordered last year, not completed - he didn't like it. Agrees to iFOB.  Prostate cancer screening - would like to continue screening. BPH on DRE 12/2019. Nocturia x2, on flomax.  Osteoporosis DEXA -2.8 lumbar (03/2015) - fosamax started 03/2017.  Osteopenia DEXA -2.1 spine (04/2019)  DEXA  04/2022 - T -1.6 L spine, not at increased fracture risk. Fosamax stopped in 2023. Lung cancer screening - not eligible  Flu shot yearly at pharmacy  COVID vaccine - Pfizer 10/2019, 12-20-19, booster 06/2020  Prevnar-13 02/2017, pneumovax 02/2018 Tdap 10/2013  zostavax - 07/2014  Shingrix - discussed - to check at pharmacy RSV - to consider at pharmacy  Advanced directive: Scanned into chart 02/2017. Mother Robert Fowler then sister Robert Fowler are HCPOA. Needs to update this.  Seat belt use discussed.  Sunscreen use discussed. No changing moles on skin.  Ex smoker - quit ~2009  Alcohol - up to 6 glasses wine daily - advised cutting down on this. Strong fmhx alcoholism.  Dentist - has full dentures - had oral mass removed by ENT 2019 - path report consistent with denture injury  Eye exam - yearly, s/p cataract surgery bilateral 03/2022.  Bowel - no constipation  Bladder - no incontinence   Lives alone  Mother and brother live nearby  Occupation: Copy at [employer]  Edu: 12th grade  Activity: mows large yard, as well as at [employer]  Diet: good water, fruits/vegetables daily      Relevant past medical, surgical, family and  social history reviewed and updated as indicated. Interim medical history since our last visit reviewed. Allergies and medications reviewed and updated. Outpatient Medications Prior to Visit  Medication Sig Dispense Refill   acetaminophen (TYLENOL) 500 MG tablet Take 1,000 mg by mouth every 6 (six) hours as needed for moderate pain or headache.     aspirin 81 MG tablet Take 81 mg by mouth daily.     benztropine (COGENTIN) 0.5 MG tablet Take 0.5 mg by mouth at bedtime.     Calcium Citrate 333 MG TABS Take 1 tablet by mouth daily.     Cholecalciferol (VITAMIN D) 125 MCG (5000 UT) CAPS Take 1 capsule by mouth daily.     divalproex (DEPAKOTE ER) 250 MG 24 hr tablet 750 mg at bedtime.     FLUoxetine (PROZAC) 20 MG capsule Take 20 mg by mouth daily.     hydrOXYzine  (ATARAX/VISTARIL) 25 MG tablet Take 1 tablet (25 mg total) by mouth 2 (two) times daily as needed for anxiety. As needed     OLANZapine (ZYPREXA) 10 MG tablet Take 1 tablet (10 mg total) by mouth at bedtime.     OLANZapine zydis (ZYPREXA) 5 MG disintegrating tablet Take 5 mg by mouth daily. Takes in mornings     tamsulosin (FLOMAX) 0.4 MG CAPS capsule TAKE ONE CAPSULE BY MOUTH ONCE DAILY 90 capsule 0   amLODipine (NORVASC) 2.5 MG tablet Take 1 tablet (2.5 mg total) by mouth daily. (Patient not taking: Reported on 05/16/2023) 90 tablet 3   No facility-administered medications prior to visit.     Per HPI unless specifically indicated in ROS section below Review of Systems  Constitutional:  Negative for activity change, appetite change, chills, fatigue, fever and unexpected weight change.  HENT:  Positive for congestion. Negative for hearing loss.   Eyes:  Negative for visual disturbance.  Respiratory:  Positive for cough (recent with COVID). Negative for chest tightness, shortness of breath and wheezing.   Cardiovascular:  Negative for chest pain, palpitations and leg swelling.  Gastrointestinal:  Positive for blood in stool (occ) and diarrhea (occ). Negative for abdominal distention, abdominal pain, constipation, nausea and vomiting.  Genitourinary:  Negative for difficulty urinating and hematuria.  Musculoskeletal:  Negative for arthralgias, myalgias and neck pain.  Skin:  Negative for rash.  Neurological:  Positive for headaches (occ). Negative for dizziness, seizures and syncope.  Hematological:  Negative for adenopathy. Does not bruise/bleed easily.  Psychiatric/Behavioral:  Negative for dysphoric mood. The patient is not nervous/anxious.     Objective:  BP (!) 144/82   Pulse 68   Temp (!) 97.3 F (36.3 C) (Temporal)   Ht 5\' 10"  (1.778 m)   Wt 194 lb 12.8 oz (88.4 kg)   SpO2 97%   BMI 27.95 kg/m   Wt Readings from Last 3 Encounters:  05/16/23 194 lb 12.8 oz (88.4 kg)  05/09/23  200 lb (90.7 kg)  11/09/22 199 lb 6 oz (90.4 kg)      Physical Exam Vitals and nursing note reviewed.  Constitutional:      General: He is not in acute distress.    Appearance: Normal appearance. He is well-developed. He is not ill-appearing.  HENT:     Head: Normocephalic and atraumatic.     Right Ear: Hearing, tympanic membrane, ear canal and external ear normal.     Left Ear: Hearing, tympanic membrane, ear canal and external ear normal.     Mouth/Throat:     Mouth: Mucous membranes are  moist.     Pharynx: Oropharynx is clear. No oropharyngeal exudate or posterior oropharyngeal erythema.  Eyes:     General: No scleral icterus.    Extraocular Movements: Extraocular movements intact.     Conjunctiva/sclera: Conjunctivae normal.     Pupils: Pupils are equal, round, and reactive to light.  Neck:     Thyroid: No thyroid mass or thyromegaly.     Vascular: No carotid bruit.  Cardiovascular:     Rate and Rhythm: Normal rate and regular rhythm.     Pulses: Normal pulses.          Radial pulses are 2+ on the right side and 2+ on the left side.     Heart sounds: Normal heart sounds. No murmur heard. Pulmonary:     Effort: Pulmonary effort is normal. No respiratory distress.     Breath sounds: Normal breath sounds. No wheezing, rhonchi or rales.  Abdominal:     General: Bowel sounds are normal. There is no distension.     Palpations: Abdomen is soft. There is no mass.     Tenderness: There is no abdominal tenderness. There is no guarding or rebound.     Hernia: No hernia is present.  Genitourinary:    Prostate: Enlarged (moderate). Not tender and no nodules present.     Rectum: Normal. No mass, tenderness, anal fissure, external hemorrhoid or internal hemorrhoid. Normal anal tone.  Musculoskeletal:        General: Normal range of motion.     Cervical back: Normal range of motion and neck supple.     Right lower leg: No edema.     Left lower leg: No edema.  Lymphadenopathy:      Cervical: No cervical adenopathy.  Skin:    General: Skin is warm and dry.     Findings: No rash.  Neurological:     General: No focal deficit present.     Mental Status: He is alert and oriented to person, place, and time.  Psychiatric:        Mood and Affect: Mood normal.        Behavior: Behavior normal.        Thought Content: Thought content normal.        Judgment: Judgment normal.       Results for orders placed or performed in visit on 05/11/23  PSA Total (Reflex To Free)  Result Value Ref Range   Prostate Specific Ag, Serum 4.0 0.0 - 4.0 ng/mL   Reflex Criteria Comment   %fPSA Reflex  Result Value Ref Range   PSA, FREE 0.36 N/A ng/mL   % FREE PSA 9.0 %   Lab Results  Component Value Date   CHOL 158 05/09/2023   HDL 60.10 05/09/2023   LDLCALC 76 05/09/2023   LDLDIRECT 89 11/20/2012   TRIG 112.0 05/09/2023   CHOLHDL 3 05/09/2023   Lab Results  Component Value Date   WBC 2.7 (L) 05/09/2023   HGB 14.6 05/09/2023   HCT 43.5 05/09/2023   MCV 90.0 05/09/2023   PLT 127.0 (L) 05/09/2023    Lab Results  Component Value Date   NA 131 (L) 05/09/2023   CL 94 (L) 05/09/2023   K 4.0 05/09/2023   CO2 28 05/09/2023   BUN 6 05/09/2023   CREATININE 0.76 05/09/2023   GFR 90.64 05/09/2023   CALCIUM 9.3 05/09/2023   PHOS 3.5 07/09/2008   ALBUMIN 4.3 05/09/2023   GLUCOSE 96 05/09/2023    Lab Results  Component Value Date   ALT 17 05/09/2023   AST 10 05/09/2023   ALKPHOS 94 05/09/2023   BILITOT 0.7 05/09/2023    Lab Results  Component Value Date   PSA1 4.0 05/11/2023   PSA 4.87 (H) 05/09/2023   PSA 5.38 (H) 05/02/2022   PSA 1.71 04/28/2021  PSA 3.5, free PSA 11%  (06/09/2022)  Assessment & Plan:   Problem List Items Addressed This Visit     Health maintenance examination - Primary (Chronic)    Preventative protocols reviewed and updated unless pt declined. Discussed healthy diet and lifestyle.       Advanced care planning/counseling discussion (Chronic)     Advanced directive: Scanned into chart 02/2017. Mother Robert Fowler then sister Robert Fowler are HCPOA. Needs to update this.       Benign prostatic hyperplasia    Continues flomax.  DRE with enlargement.  See below re elevated PSA.       Relevant Medications   tamsulosin (FLOMAX) 0.4 MG CAPS capsule   Other Relevant Orders   Ambulatory referral to Urology   Osteopenia    Latest DEXA stable.  Continue good calcium in diet.  Took fosamax x5 yrs.       Habitual alcohol use    Encouraged cessation - reviewing health risks of ongoing use.       Bipolar 1 disorder [facility])    Sees psychiatrist regularly. Continue current regimen.       Schizoaffective disorder (HCC)   Essential hypertension    Chronic, BP above goal in setting of running out of amlodipine - refilled today.       Relevant Medications   amLODipine (NORVASC) 2.5 MG tablet   Elevated PSA    Mild PSA elevation noted presumed due to BPH with reassuring DRE however free PSA % is low - will refer to urology for recommendations.       Relevant Orders   Ambulatory referral to Urology   Caregiver stress    Mother now lives in nursing home. Support provided.       HLD (hyperlipidemia)    Chronic, improved off medication - continue to monitor.  The 10-year ASCVD risk score (Arnett DK, et al., 2019) is: 22.5%   Values used to calculate the score:     Age: 60 years     Sex: Male     Is Non-Hispanic African American: No     Diabetic: No     Tobacco smoker: No     Systolic Blood Pressure: 144 mmHg     Is BP treated: Yes     HDL Cholesterol: 60.1 mg/dL     Total Cholesterol: 158 mg/dL       Relevant Medications   amLODipine (NORVASC) 2.5 MG tablet   Abnormal CBC    With leukopenia and thrombocytopenia - ?related to alcohol use vs vitamin deficiency. See below.        Vitamin B1 deficiency    Rec start B complex MVI This could contribute to memory difficulty.       Vitamin B12 deficiency    Rec start B complex  MVI      Other Visit Diagnoses     Special screening for malignant neoplasms, colon       Relevant Orders   Fecal occult blood, imunochemical        Meds ordered this encounter  Medications   amLODipine (NORVASC) 2.5 MG tablet    Sig: Take 1 tablet (2.5 mg total) by mouth daily.  Dispense:  90 tablet    Refill:  4   tamsulosin (FLOMAX) 0.4 MG CAPS capsule    Sig: Take 1 capsule (0.4 mg total) by mouth daily.    Dispense:  90 capsule    Refill:  4   b complex vitamins capsule    Sig: Take 1 capsule by mouth daily.    Orders Placed This Encounter  Procedures   Fecal occult blood, imunochemical    Standing Status:   Future    Standing Expiration Date:   05/15/2024   Ambulatory referral to Urology    Referral Priority:   Routine    Referral Type:   Consultation    Referral Reason:   Specialty Services Required    Requested Specialty:   Urology    Number of Visits Requested:   1    Patient Instructions  Start B complex vitamin We will refer you to urology for elevated PSA Pass by lab to pick up stool kit today for colon cancer screening.  Restart amlodipine 2.5mg  daily.  If interested, check with pharmacy about new 2 shot shingles series (shingrix) as well as RSV shot.  Cut down on drinking to protect health (liver, brain, nerves).  Return in 6 months for follow up visit   Follow up plan: Return in about 6 months (around 11/16/2023) for follow up visit.  Eustaquio Boyden, MD

## 2023-05-16 NOTE — Assessment & Plan Note (Signed)
Preventative protocols reviewed and updated unless pt declined. Discussed healthy diet and lifestyle.  

## 2023-05-16 NOTE — Assessment & Plan Note (Addendum)
Latest DEXA stable.  Continue good calcium in diet.  Took fosamax x5 yrs.

## 2023-05-23 ENCOUNTER — Telehealth: Payer: Self-pay

## 2023-05-23 DIAGNOSIS — R195 Other fecal abnormalities: Secondary | ICD-10-CM

## 2023-05-23 LAB — FECAL OCCULT BLOOD, IMMUNOCHEMICAL: Fecal Occult Bld: POSITIVE — AB

## 2023-05-23 NOTE — Telephone Encounter (Signed)
Plz notify stool test returned positive for blood.  For this reason I recommend referral to GI for colonoscopy. Referral placed to [facility].

## 2023-05-23 NOTE — Telephone Encounter (Signed)
Jacki Cones with [facility] called report with + ifob in epic also. Sending note to Dr Reece Agar and G pool and will teams [employer].who is working with Dr Reece Agar today.

## 2023-05-24 NOTE — Telephone Encounter (Signed)
Called and notified patient, reviewed all information. He verbalized understanding. Advised referrals team will reach out regarding scheduling. Will call with further questions.

## 2023-05-26 ENCOUNTER — Encounter: Payer: Self-pay | Admitting: Gastroenterology

## 2023-06-15 DIAGNOSIS — F419 Anxiety disorder, unspecified: Secondary | ICD-10-CM | POA: Diagnosis not present

## 2023-06-15 DIAGNOSIS — F250 Schizoaffective disorder, bipolar type: Secondary | ICD-10-CM | POA: Diagnosis not present

## 2023-07-28 HISTORY — PX: COLONOSCOPY: SHX174

## 2023-07-31 ENCOUNTER — Other Ambulatory Visit: Payer: Self-pay | Admitting: Urology

## 2023-07-31 DIAGNOSIS — R972 Elevated prostate specific antigen [PSA]: Secondary | ICD-10-CM | POA: Diagnosis not present

## 2023-07-31 DIAGNOSIS — R3915 Urgency of urination: Secondary | ICD-10-CM | POA: Diagnosis not present

## 2023-07-31 DIAGNOSIS — N401 Enlarged prostate with lower urinary tract symptoms: Secondary | ICD-10-CM | POA: Diagnosis not present

## 2023-08-08 ENCOUNTER — Encounter: Payer: Self-pay | Admitting: Gastroenterology

## 2023-08-08 ENCOUNTER — Telehealth: Payer: Self-pay | Admitting: Family Medicine

## 2023-08-08 ENCOUNTER — Ambulatory Visit: Payer: Medicare Other | Admitting: Gastroenterology

## 2023-08-08 VITALS — BP 132/84 | HR 58 | Ht 70.0 in | Wt 199.0 lb

## 2023-08-08 DIAGNOSIS — R195 Other fecal abnormalities: Secondary | ICD-10-CM | POA: Insufficient documentation

## 2023-08-08 MED ORDER — NA SULFATE-K SULFATE-MG SULF 17.5-3.13-1.6 GM/177ML PO SOLN: 1.0000 | Freq: Once | ORAL | 0 refills | Status: AC

## 2023-08-08 NOTE — Telephone Encounter (Signed)
Patient is scheduled for colonoscopy at [facility],but the provider that he is seeing there is out of network with his insurance.   [facility] called and gave : [facility] in [place] that  does accept his insurance if referral could be resent there [phone]

## 2023-08-08 NOTE — Patient Instructions (Signed)
You have been scheduled for a colonoscopy. Please follow written instructions given to you at your visit today.   Please pick up your prep supplies at the pharmacy within the next 1-3 days.  If you use inhalers (even only as needed), please bring them with you on the day of your procedure.  DO NOT TAKE 7 DAYS PRIOR TO TEST- Trulicity (dulaglutide) Ozempic, Wegovy (semaglutide) Mounjaro (tirzepatide) Bydureon Bcise (exanatide extended release)  DO NOT TAKE 1 DAY PRIOR TO YOUR TEST Rybelsus (semaglutide) Adlyxin (lixisenatide) Victoza (liraglutide) Byetta (exanatide) ______________________________________________________________________  _______________________________________________________  If your blood pressure at your visit was 140/90 or greater, please contact your primary care physician to follow up on this.  _______________________________________________________  If you are age 57 or older, your body mass index should be between 23-30. Your Body mass index is 28.55 kg/m. If this is out of the aforementioned range listed, please consider follow up with your Primary Care Provider.  If you are age 12 or younger, your body mass index should be between 19-25. Your Body mass index is 28.55 kg/m. If this is out of the aformentioned range listed, please consider follow up with your Primary Care Provider.   ________________________________________________________  The  GI providers would like to encourage you to use [facility] to communicate with providers for non-urgent requests or questions.  Due to long hold times on the telephone, sending your provider a message by [facility] may be a faster and more efficient way to get a response.  Please allow 48 business hours for a response.  Please remember that this is for non-urgent requests.  _______________________________________________________

## 2023-08-08 NOTE — Progress Notes (Signed)
____________________________________________________________  Attending physician addendum:  Thank you for sending this case to me. I have reviewed the entire note and agree with the plan.   Malyssa Maris Danis, MD  ____________________________________________________________  

## 2023-08-08 NOTE — Progress Notes (Signed)
08/08/2023 Delone Heckle [id number] [birth year]   HISTORY OF PRESENT ILLNESS: This is a 72 year old male who is new to our office.  He has PMH of bipolar disorder, schizoaffective disorder with a history of paranoid schizophrenia.  He has been referred here by his PCP, Dr. Sharen Hones, for a positive fecal immunochemistry test.  Patient has never had a colonoscopy in the past.  Recent fecal immunochemistry was positive.  Hgb is normal.  He denies seeing red blood in his stool.  Says that on occasion his stools will be very dark, but does use Pepto-Bismol on occasion.  Has fairly regular bowel movements, but does have some diarrhea with urgency intermittently for which he uses that Pepto-Bismol.  No abdominal pain.  Rare heartburn and reflux.  No NSAID use.  Was given a Cologuard kit previously but never performed that.  Says that he drinks 4-8 glasses of wine daily.  Has already discussed with his PCP regarding decreasing use.  LFTs are normal.  Has history or alcoholism in his family.  Past Medical History:  Diagnosis Date   Arthritis    BENIGN PROSTATIC HYPERTROPHY 07/18/2007   Bipolar disorder (HCC)    Habitual alcohol use    Lesion of oral mucosa 03/21/2018   Biopsy 03/2018 [facility]) - INFLAMMATORY FIBROUS HYPERPLASIA CONSISTENT WITH DENTURE INJURY (EPULIS FISSURATUM).    OSTEOPOROSIS 09/04/2007   DEXA -2.8 lumbar (03/2015)   Pes planus    Prediabetes 2014   Schizoaffective disorder (HCC)    h/o paranoid schizophrenia   Wears dentures    full upper and lower   Past Surgical History:  Procedure Laterality Date   INGUINAL HERNIA REPAIR Right 08/2014   Dr Harlon Flor   MASS EXCISION N/A 04/13/2018   Procedure: EXCISION LOWER LIP ORAL  MASS;  Surgeon: Linus Salmons, MD;  Location[facility];  Service: ENT;  Laterality: N/A;    reports that he quit smoking about 16 years ago. His smoking use included cigarettes. He has never used smokeless tobacco. He reports current alcohol use.  He reports current drug use. Drug: Marijuana. family history includes Alcohol abuse in his father and maternal grandmother; COPD in his mother; Dementia in his mother. Allergies  Allergen Reactions   Poison Oak Extract      Outpatient Encounter Medications as of 08/08/2023  Medication Sig   acetaminophen (TYLENOL) 500 MG tablet Take 1,000 mg by mouth every 6 (six) hours as needed for moderate pain or headache.   amLODipine (NORVASC) 2.5 MG tablet Take 1 tablet (2.5 mg total) by mouth daily.   aspirin 81 MG tablet Take 81 mg by mouth daily.   b complex vitamins capsule Take 1 capsule by mouth daily.   benztropine (COGENTIN) 0.5 MG tablet Take 0.5 mg by mouth at bedtime.   Calcium Citrate 333 MG TABS Take 1 tablet by mouth daily.   Cholecalciferol (VITAMIN D) 125 MCG (5000 UT) CAPS Take 1 capsule by mouth daily.   divalproex (DEPAKOTE ER) 250 MG 24 hr tablet 750 mg at bedtime.   FLUoxetine (PROZAC) 20 MG capsule Take 20 mg by mouth daily.   hydrOXYzine (ATARAX/VISTARIL) 25 MG tablet Take 1 tablet (25 mg total) by mouth 2 (two) times daily as needed for anxiety. As needed   OLANZapine (ZYPREXA) 10 MG tablet Take 1 tablet (10 mg total) by mouth at bedtime.   OLANZapine zydis (ZYPREXA) 5 MG disintegrating tablet Take 5 mg by mouth daily. Takes in mornings   tamsulosin (FLOMAX) 0.4 MG  CAPS capsule Take 1 capsule (0.4 mg total) by mouth daily.   No facility-administered encounter medications on file as of 08/08/2023.     REVIEW OF SYSTEMS  : All other systems reviewed and negative except where noted in the History of Present Illness.   PHYSICAL EXAM: BP 132/84   Pulse (!) 58   Ht 5\' 10"  (1.778 m)   Wt 199 lb (90.3 kg)   BMI 28.55 kg/m  General: Well developed white male in no acute distress Head: Normocephalic and atraumatic Eyes:  Sclerae anicteric, conjunctiva pink. Ears: Normal auditory acuity Lungs: Clear throughout to auscultation; no W/R/R. Heart: Regular rate and rhythm; no  M/R/G. Rectal: Will be done at the time of colonoscopy. Musculoskeletal: Symmetrical with no gross deformities  Skin: No lesions on visible extremities Extremities: No edema  Neurological: Alert oriented x 4, grossly non-focal Psychological:  Alert and cooperative. Normal mood and affect  ASSESSMENT AND PLAN: *Positive fecal immunochemistry:  Does not see blood in his stool, is not anemia.  Never had a colonoscopy in the past.  Will schedule for colonoscopy with Dr. Myrtie Neither.  The risks, benefits, and alternatives to colonoscopy were discussed with the patient and he consents to proceed.   CC:  Eustaquio Boyden, MD

## 2023-08-23 ENCOUNTER — Encounter: Payer: Self-pay | Admitting: Gastroenterology

## 2023-08-23 ENCOUNTER — Ambulatory Visit: Payer: Medicare Other | Admitting: Gastroenterology

## 2023-08-23 VITALS — BP 105/67 | HR 62 | Temp 97.3°F | Resp 20 | Ht 70.0 in | Wt 199.0 lb

## 2023-08-23 DIAGNOSIS — Q438 Other specified congenital malformations of intestine: Secondary | ICD-10-CM

## 2023-08-23 DIAGNOSIS — D123 Benign neoplasm of transverse colon: Secondary | ICD-10-CM

## 2023-08-23 DIAGNOSIS — R195 Other fecal abnormalities: Secondary | ICD-10-CM | POA: Diagnosis not present

## 2023-08-23 DIAGNOSIS — D120 Benign neoplasm of cecum: Secondary | ICD-10-CM | POA: Diagnosis not present

## 2023-08-23 DIAGNOSIS — Z1211 Encounter for screening for malignant neoplasm of colon: Secondary | ICD-10-CM

## 2023-08-23 MED ORDER — SODIUM CHLORIDE 0.9 % IV SOLN: 500.0000 mL | INTRAVENOUS | Status: DC

## 2023-08-23 NOTE — Progress Notes (Signed)
Pt's states no medical or surgical changes since previsit or office visit. 

## 2023-08-23 NOTE — Patient Instructions (Signed)
Handout on polyps given to patient Await pathology results Resume previous diet and continue present medications Repeat colonoscopy for surveillance will be determined based off of pathology results   YOU HAD AN ENDOSCOPIC PROCEDURE TODAY AT THE [facility]:   Refer to the procedure report that was given to you for any specific questions about what was found during the examination.  If the procedure report does not answer your questions, please call your gastroenterologist to clarify.  If you requested that your care partner not be given the details of your procedure findings, then the procedure report has been included in a sealed envelope for you to review at your convenience later.  YOU SHOULD EXPECT: Some feelings of bloating in the abdomen. Passage of more gas than usual.  Walking can help get rid of the air that was put into your GI tract during the procedure and reduce the bloating. If you had a lower endoscopy (such as a colonoscopy or flexible sigmoidoscopy) you may notice spotting of blood in your stool or on the toilet paper. If you underwent a bowel prep for your procedure, you may not have a normal bowel movement for a few days.  Please Note:  You might notice some irritation and congestion in your nose or some drainage.  This is from the oxygen used during your procedure.  There is no need for concern and it should clear up in a day or so.  SYMPTOMS TO REPORT IMMEDIATELY:  Following lower endoscopy (colonoscopy or flexible sigmoidoscopy):  Excessive amounts of blood in the stool  Significant tenderness or worsening of abdominal pains  Swelling of the abdomen that is new, acute  Fever of 100F or higher   For urgent or emergent issues, a gastroenterologist can be reached at any hour by calling [phone]. Do not use MyChart messaging for urgent concerns.    DIET:  We do recommend a small meal at first, but then you may proceed to your regular diet.  Drink plenty  of fluids but you should avoid alcoholic beverages for 24 hours.  ACTIVITY:  You should plan to take it easy for the rest of today and you should NOT DRIVE or use heavy machinery until tomorrow (because of the sedation medicines used during the test).    FOLLOW UP: Our staff will call the number listed on your records the next business day following your procedure.  We will call around 7:15- 8:00 am to check on you and address any questions or concerns that you may have regarding the information given to you following your procedure. If we do not reach you, we will leave a message.     If any biopsies were taken you will be contacted by phone or by letter within the next 1-3 weeks.  Please call us at [phone] if you have not heard about the biopsies in 3 weeks.    SIGNATURES/CONFIDENTIALITY: You and/or your care partner have signed paperwork which will be entered into your electronic medical record.  These signatures attest to the fact that that the information above on your After Visit Summary has been reviewed and is understood.  Full responsibility of the confidentiality of this discharge information lies with you and/or your care-partner.

## 2023-08-23 NOTE — Progress Notes (Signed)
Called to room to assist during endoscopic procedure.  Patient ID and intended procedure confirmed with present staff. Received instructions for my participation in the procedure from the performing physician.  

## 2023-08-23 NOTE — Op Note (Signed)
[facility] Patient Name: Robert Fowler Procedure Date: 08/23/2023 11:12 AM MRN: [id number] Endoscopist: Sherilyn Cooter L. Myrtie Neither , MD, [id number] Age: 72 Referring MD:  Date of Birth: Dec 14, 1950 Gender: Male Account #: [id number] Procedure:                Colonoscopy Indications:              Positive fecal immunochemical test; normal                            hemoglobin, no symptoms Medicines:                Monitored Anesthesia Care Procedure:                Pre-Anesthesia Assessment:                           - Prior to the procedure, a History and Physical                            was performed, and patient medications and                            allergies were reviewed. The patient's tolerance of                            previous anesthesia was also reviewed. The risks                            and benefits of the procedure and the sedation                            options and risks were discussed with the patient.                            All questions were answered, and informed consent                            was obtained. Prior Anticoagulants: The patient has                            taken no anticoagulant or antiplatelet agents. ASA                            Grade Assessment: II - A patient with mild systemic                            disease. After reviewing the risks and benefits,                            the patient was deemed in satisfactory condition to                            undergo the procedure.  After obtaining informed consent, the colonoscope                            was passed under direct vision. Throughout the                            procedure, the patient's blood pressure, pulse, and                            oxygen saturations were monitored continuously. The                            Olympus Scope [id number] was introduced through the                            anus and advanced to the the cecum,  identified by                            appendiceal orifice and ileocecal valve. The                            colonoscopy was performed with difficulty due to a                            redundant colon and significant looping as well as                            colonic spasticity. Successful completion of the                            procedure was aided by using manual pressure and                            straightening and shortening the scope to obtain                            bowel loop reduction. The patient tolerated the                            procedure well. The quality of the bowel                            preparation was good. The ileocecal valve,                            appendiceal orifice, and rectum were photographed. Scope In: 11:25:30 AM Scope Out: 11:57:32 AM Scope Withdrawal Time: 0 hours 17 minutes 15 seconds  Total Procedure Duration: 0 hours 32 minutes 2 seconds  Findings:                 The perianal and digital rectal examinations were                            normal.  Repeat examination of right colon under NBI                            performed.                           A 12 mm polyp was found in the cecum. The polyp was                            sessile. The polyp was removed with a cold snare.                            Resection and retrieval were complete.                           Four sessile polyps were found in the proximal                            transverse colon and distal transverse colon. The                            polyps were diminutive in size. These polyps were                            removed with a cold snare. Resection and retrieval                            were complete.                           The colon (entire examined portion) was                            significantly redundant.                           The exam was otherwise without abnormality on                             direct and retroflexion views. Complications:            No immediate complications. Estimated Blood Loss:     Estimated blood loss was minimal. Impression:               - One 12 mm polyp in the cecum, removed with a cold                            snare. Resected and retrieved.                           - Four diminutive polyps in the proximal transverse                            colon and in the distal transverse colon, removed  with a cold snare. Resected and retrieved.                           - Redundant colon.                           - The examination was otherwise normal on direct                            and retroflexion views.                           Apparent false positive FIT test. Recommendation:           - Patient has a contact number available for                            emergencies. The signs and symptoms of potential                            delayed complications were discussed with the                            patient. Return to normal activities tomorrow.                            Written discharge instructions were provided to the                            patient.                           - Resume previous diet.                           - Continue present medications.                           - Await pathology results.                           - Repeat colonoscopy is recommended for                            surveillance. The colonoscopy date will be                            determined after pathology results from today's                            exam become available for review. Kaedyn Polivka L. Myrtie Neither, MD 08/23/2023 12:07:25 PM This report has been signed electronically.

## 2023-08-23 NOTE — Progress Notes (Signed)
Sedate, gd SR, tolerated procedure well, VSS, report to RN 

## 2023-08-23 NOTE — Progress Notes (Signed)
No significant changes to clinical history since GI office visit on 08/08/23.  The patient is appropriate for an endoscopic procedure in the ambulatory setting.  - Amada Jupiter, MD

## 2023-08-28 ENCOUNTER — Telehealth: Payer: Self-pay

## 2023-08-28 NOTE — Telephone Encounter (Signed)
  Follow up Call-     08/23/2023   10:17 AM  Call back number  Post procedure Call Back phone  # [phone]  Permission to leave phone message Yes     Patient questions:  Do you have a fever, pain , or abdominal swelling? No. Pain Score  0 *  Have you tolerated food without any problems? Yes.    Have you been able to return to your normal activities? Yes.    Do you have any questions about your discharge instructions: Diet   No. Medications  No. Follow up visit  No.  Do you have questions or concerns about your Care? No.  Actions: * If pain score is 4 or above: No action needed, pain <4.

## 2023-08-29 ENCOUNTER — Encounter: Payer: Self-pay | Admitting: Gastroenterology

## 2023-08-29 LAB — SURGICAL PATHOLOGY

## 2023-09-05 ENCOUNTER — Encounter: Payer: Self-pay | Admitting: Urology

## 2023-09-16 ENCOUNTER — Other Ambulatory Visit: Payer: Medicare Other

## 2023-10-03 DIAGNOSIS — F250 Schizoaffective disorder, bipolar type: Secondary | ICD-10-CM | POA: Diagnosis not present

## 2023-10-03 DIAGNOSIS — F419 Anxiety disorder, unspecified: Secondary | ICD-10-CM | POA: Diagnosis not present

## 2023-10-30 ENCOUNTER — Ambulatory Visit
Admission: RE | Admit: 2023-10-30 | Discharge: 2023-10-30 | Disposition: A | Discharge status: Home / Self Care | Payer: Medicare Other | Source: Ambulatory Visit | Attending: Urology

## 2023-10-30 DIAGNOSIS — R972 Elevated prostate specific antigen [PSA]: Secondary | ICD-10-CM | POA: Diagnosis not present

## 2023-10-30 MED ORDER — GADOPICLENOL 0.5 MMOL/ML IV SOLN
10.0000 mL | Freq: Once | INTRAVENOUS | Status: AC | PRN
  Administered 2023-10-30: 10 mL via INTRAVENOUS

## 2023-11-01 ENCOUNTER — Ambulatory Visit: Payer: Self-pay | Admitting: Family Medicine

## 2023-11-01 NOTE — Telephone Encounter (Signed)
 Copied from CRM [id number]. Topic: Appointments - Appointment Scheduling >> Nov 01, 2023  4:59 PM Tonda B wrote: Patient/patient representative is calling to schedule an appointment. Refer to attachments for appointment information. Patient has bladder infection   Chief Complaint: Pain with urination  Symptoms: pain with urination, decreased urinary output, feeling of bladder fullness Frequency: Constant  Pertinent Negatives: Patient denies recorded fever Disposition: [x] ED /[] Urgent Care (no appt availability in office) / [] Appointment(In office/virtual)/ []  [place]/ [] Home Care/ [] Refused Recommended Disposition /[] [place] [facility]/ []  Follow-up with PCP Additional Notes: Patient reports that for the last 2 days he has been experiencing urinary pain and decreased urinary output. He states that he has to use the bathroom very often and that no much urine comes out. He states that when he is urinating he experiences moderate to severe pain. He states he also felt warm today which improved after taking Tylenol , but has not checked his temperature. Patient advised to go to the ED for evaluation and treatment due to risk of urinary retention.       Reason for Disposition  [1] Unable to urinate (or only a few drops) > 4 hours AND [2] bladder feels very full (e.g., feels blocked with strong urge to urinate; palpable bladder)  Answer Assessment - Initial Assessment Questions 1. SEVERITY: How bad is the pain?  (e.g., Scale 1-10; mild, moderate, or severe)   - MILD (1-3): Complains slightly about urination hurting.   - MODERATE (4-7): Interferes with normal activities.     - SEVERE (8-10): Excruciating, unwilling or unable to urinate because of the pain.      Moderate 2. FREQUENCY: How many times have you had painful urination today?      Several  3. PATTERN: Is pain present every time you urinate or just sometimes?      Every urination  4. ONSET: When did the painful  urination start?      2 days ago 5. FEVER: Do you have a fever? If Yes, ask: What is your temperature, how was it measured, and when did it start?     Felt feverish but has not checked temperature  6. PAST UTI: Have you had a urine infection before? If Yes, ask: When was the last time? and What happened that time?      Yes, treated with antibiotics  7. CAUSE: What do you think is causing the painful urination?      History of prostate problems 8. OTHER SYMPTOMS: Do you have any other symptoms? (e.g., flank pain, penis discharge, scrotal pain, blood in urine)     Aching around neck  Protocols used: Urination Pain - Male-A-AH

## 2023-11-02 DIAGNOSIS — R300 Dysuria: Secondary | ICD-10-CM | POA: Diagnosis not present

## 2023-11-02 DIAGNOSIS — R109 Unspecified abdominal pain: Secondary | ICD-10-CM | POA: Diagnosis not present

## 2023-11-02 DIAGNOSIS — N390 Urinary tract infection, site not specified: Secondary | ICD-10-CM | POA: Diagnosis not present

## 2023-11-02 NOTE — Telephone Encounter (Signed)
 Called patient did not see where he was seen in ED. He went to an urgent care and was evaluated. No further questions at this time.

## 2023-11-17 ENCOUNTER — Encounter: Payer: Self-pay | Admitting: Family Medicine

## 2023-11-17 ENCOUNTER — Ambulatory Visit: Payer: Medicare Other | Admitting: Family Medicine

## 2023-11-17 VITALS — BP 124/80 | HR 72 | Temp 98.0°F | Ht 70.0 in | Wt 206.0 lb

## 2023-11-17 DIAGNOSIS — F1090 Alcohol use, unspecified, uncomplicated: Secondary | ICD-10-CM

## 2023-11-17 DIAGNOSIS — N401 Enlarged prostate with lower urinary tract symptoms: Secondary | ICD-10-CM

## 2023-11-17 DIAGNOSIS — E519 Thiamine deficiency, unspecified: Secondary | ICD-10-CM

## 2023-11-17 DIAGNOSIS — R351 Nocturia: Secondary | ICD-10-CM

## 2023-11-17 DIAGNOSIS — R7989 Other specified abnormal findings of blood chemistry: Secondary | ICD-10-CM | POA: Diagnosis not present

## 2023-11-17 DIAGNOSIS — E538 Deficiency of other specified B group vitamins: Secondary | ICD-10-CM | POA: Diagnosis not present

## 2023-11-17 DIAGNOSIS — F250 Schizoaffective disorder, bipolar type: Secondary | ICD-10-CM

## 2023-11-17 LAB — CBC WITH DIFFERENTIAL/PLATELET
Basophils Absolute: 0 10*3/uL (ref 0.0–0.1)
Basophils Relative: 1 % (ref 0.0–3.0)
Eosinophils Absolute: 0 10*3/uL (ref 0.0–0.7)
Eosinophils Relative: 1 % (ref 0.0–5.0)
HCT: 44.5 % (ref 39.0–52.0)
Hemoglobin: 15.3 g/dL (ref 13.0–17.0)
Lymphocytes Relative: 20.1 % (ref 12.0–46.0)
Lymphs Abs: 0.8 10*3/uL (ref 0.7–4.0)
MCHC: 34.3 g/dL (ref 30.0–36.0)
MCV: 92.3 fL (ref 78.0–100.0)
Monocytes Absolute: 0.3 10*3/uL (ref 0.1–1.0)
Monocytes Relative: 8.1 % (ref 3.0–12.0)
Neutro Abs: 2.9 10*3/uL (ref 1.4–7.7)
Neutrophils Relative %: 69.8 % (ref 43.0–77.0)
Platelets: 256 10*3/uL (ref 150.0–400.0)
RBC: 4.82 Mil/uL (ref 4.22–5.81)
RDW: 13.8 % (ref 11.5–15.5)
WBC: 4.2 10*3/uL (ref 4.0–10.5)

## 2023-11-17 LAB — COMPREHENSIVE METABOLIC PANEL
ALT: 17 U/L (ref 0–53)
AST: 15 U/L (ref 0–37)
Albumin: 4.4 g/dL (ref 3.5–5.2)
Alkaline Phosphatase: 68 U/L (ref 39–117)
BUN: 5 mg/dL — ABNORMAL LOW (ref 6–23)
CO2: 30 meq/L (ref 19–32)
Calcium: 9.7 mg/dL (ref 8.4–10.5)
Chloride: 96 meq/L (ref 96–112)
Creatinine, Ser: 0.75 mg/dL (ref 0.40–1.50)
GFR: 90.66 mL/min (ref >60.00)
Glucose, Bld: 96 mg/dL (ref 70–99)
Potassium: 4.5 meq/L (ref 3.5–5.1)
Sodium: 132 meq/L — ABNORMAL LOW (ref 135–145)
Total Bilirubin: 0.9 mg/dL (ref 0.2–1.2)
Total Protein: 7 g/dL (ref 6.0–8.3)

## 2023-11-17 LAB — PROTIME-INR
INR: 1 {ratio} (ref 0.8–1.0)
Prothrombin Time: 10.2 s (ref 9.6–13.1)

## 2023-11-17 LAB — VITAMIN B12: Vitamin B-12: 487 pg/mL (ref 211–911)

## 2023-11-17 NOTE — Assessment & Plan Note (Signed)
Update levels with daily B-complex MVI

## 2023-11-17 NOTE — Patient Instructions (Addendum)
Labs today  Call [facility] Dr Liliane Shi to follow up on prostate biopsy [phone] We will also request latest urology records.  Cut down on drinking - for overall health.  Good to see you today - return as needed or in 6 months for physical/wellness visit   Work on 4 core lifestyle modifications to support a healthy mind:  1. Nutritious well balance diet.  2. Regular physical activity routine.  3. Regular mental activity such as reading books, word puzzles, math puzzles, jigsaw puzzles.  4. Social engagement.  Also ensure good blood pressure control, limit alcohol, no smoking.

## 2023-11-17 NOTE — Progress Notes (Signed)
Ph: [phone] Fax: [phone]   Patient ID: Robert Fowler, male    DOB: Mar 14, 1951, 73 y.o.   MRN: [id number]  This visit was conducted in person.  BP 124/80   Pulse 72   Temp 98 F (36.7 C) (Oral)   Ht 5\' 10"  (1.778 m)   Wt 206 lb (93.4 kg)   SpO2 96%   BMI 29.56 kg/m    CC: 6 mo f/u visit  Subjective:   HPI: Robert Fowler is a 73 y.o. male presenting on 11/17/2023 for Medical Management of Chronic Issues (Here for 6 mo f/u. Also, c/o nausea. Denies any other sxs. 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 Mother moved into nursing home 2 wks ago. Brother passed away 01/02/22- he had alcoholic liver cirrhosis.   Seen earlier this month at [facility] for UTI treated with abx but unsure which type. Symptoms fully improved.   Recent colonoscopy - 1.2cm cecal polyp removed, several other polyps also removed - all TAs, rec rpt 3 yrs ([facility]).   Sees urology Dr Liliane Shi for BPH on flomax.   Sees psychiatrist Arlana Hove NP at [facility] for schizoaffective disorder.   Has had several falls this year but without injury. Denies neuropathy symptoms but endorses occ RLS.  Alcohol - 5-6 glass of wine/day - advised to cut down on this to max 2-3 glasses/day.    He continues taking B complex vitamin.      Relevant past medical, surgical, family and social history reviewed and updated as indicated. Interim medical history since our last visit reviewed. Allergies and medications reviewed and updated. Outpatient Medications Prior to Visit  Medication Sig Dispense Refill   acetaminophen (TYLENOL) 500 MG tablet Take 1,000 mg by mouth every 6 (six) hours as needed for moderate pain or headache.     amLODipine (NORVASC) 2.5 MG tablet Take 1 tablet (2.5 mg total) by mouth daily. 90 tablet 4   aspirin 81 MG tablet Take 81 mg by mouth daily.     b complex vitamins capsule Take 1 capsule by mouth daily.     benztropine  (COGENTIN) 0.5 MG tablet Take 0.5 mg by mouth at bedtime.     Calcium Citrate 333 MG TABS Take 1 tablet by mouth daily.     Cholecalciferol (VITAMIN D) 125 MCG (5000 UT) CAPS Take 1 capsule by mouth daily.     divalproex (DEPAKOTE ER) 250 MG 24 hr tablet 750 mg at bedtime.     FLUoxetine (PROZAC) 20 MG capsule Take 20 mg by mouth daily.     hydrOXYzine (ATARAX/VISTARIL) 25 MG tablet Take 1 tablet (25 mg total) by mouth 2 (two) times daily as needed for anxiety. As needed     OLANZapine (ZYPREXA) 10 MG tablet Take 1 tablet (10 mg total) by mouth at bedtime.     OLANZapine zydis (ZYPREXA) 5 MG disintegrating tablet Take 5 mg by mouth daily. Takes in mornings     tamsulosin (FLOMAX) 0.4 MG CAPS capsule Take 1 capsule (0.4 mg total) by mouth daily. 90 capsule 4   No facility-administered medications prior to visit.     Per HPI unless specifically indicated in ROS section below Review of Systems  Objective:  BP 124/80   Pulse 72   Temp 98 F (36.7 C) (Oral)   Ht 5\' 10"  (1.778 m)   Wt 206 lb (93.4 kg)   SpO2 96%  BMI 29.56 kg/m   Wt Readings from Last 3 Encounters:  11/17/23 206 lb (93.4 kg)  08/23/23 199 lb (90.3 kg)  08/08/23 199 lb (90.3 kg)      Physical Exam Vitals and nursing note reviewed.  Constitutional:      Appearance: Normal appearance. He is not ill-appearing.  HENT:     Head: Normocephalic and atraumatic.     Mouth/Throat:     Mouth: Mucous membranes are moist.     Pharynx: Oropharynx is clear. No oropharyngeal exudate or posterior oropharyngeal erythema.  Eyes:     Extraocular Movements: Extraocular movements intact.     Pupils: Pupils are equal, round, and reactive to light.  Cardiovascular:     Rate and Rhythm: Normal rate and regular rhythm.     Pulses: Normal pulses.     Heart sounds: Normal heart sounds. No murmur heard. Pulmonary:     Effort: Pulmonary effort is normal. No respiratory distress.     Breath sounds: Normal breath sounds. No wheezing,  rhonchi or rales.  Musculoskeletal:     Right lower leg: No edema.     Left lower leg: No edema.  Skin:    General: Skin is warm and dry.     Findings: No rash.  Neurological:     Mental Status: He is alert.  Psychiatric:        Mood and Affect: Mood normal.        Behavior: Behavior normal.       Results for orders placed or performed in visit on 08/23/23  Surgical pathology ([facility])   Collection Time: 08/23/23 12:00 AM  Result Value Ref Range   SURGICAL PATHOLOGY      SURGICAL PATHOLOGY [facility] [address], [address] [place], [place] [address] Telephone [phone] or [phone] Fax [phone]  REPORT OF SURGICAL PATHOLOGY   Accession #: [id number] Patient Name: Robert Fowler, Robert Fowler Visit # : [id number]  MRN: [id number] Physician: Amada Jupiter DOB/Age 09-01-1951 (Age: 74) Gender: M Collected Date: 08/23/2023 Received Date: 08/28/2023  FINAL DIAGNOSIS       1. Surgical [P], colon, cecum, polyp (1) :       TUBULAR ADENOMA      NEGATIVE FOR HIGH-GRADE DYSPLASIA AND CARCINOMA       2. Surgical [P], colon, transverse, polyp (4) :       TUBULAR ADENOMA, 4 FRAGMENTS      NEGATIVE FOR HIGH-GRADE DYSPLASIA AND CARCINOMA       ELECTRONIC SIGNATURE : [place]les[place] Md, Fred , Sports administrator, International aid/development worker  MICROSCOPIC DESCRIPTION  CASE COMMENTS STAINS USED IN DIAGNOSIS: H&E H&E    CLINICAL HISTORY  SPECIMEN(S) OBTAINED 1. Surgical [P], Colon, Cecum, Polyp (1) 2. Surgical [P], Colon, Trans verse, Polyp (4)  SPECIMEN COMMENTS: 1. Positive FIT (fecal immunochemical test); benign neoplasm of cecum; benign neoplasm of transverse colon SPECIMEN CLINICAL INFORMATION: 1. R/O adenoma 2. R/O adenoma    Gross Description 1. Received in formalin are tan, soft tissue fragments that are submitted in toto.Number: 4, Size: 0.2 cm smallest to 0.5 cm largest, (1B) ( TA ) 2. Received in formalin are tan, soft tissue fragments that  are submitted in toto.Number: 4, Size: 0.3 cm smallest to 0.9 cm largest, (1B) ( TA )        Report signed out from the following location(s) . [facility] [address], [place] [address] CLIA #: [id number]  [facility] [address]  [place], [place] [address] CLIA #: [id number]    Lab Results  Component Value Date   NA 131 (L) 05/09/2023   CL 94 (L) 05/09/2023   K 4.0 05/09/2023   CO2 28 05/09/2023   BUN 6 05/09/2023   CREATININE 0.76 05/09/2023   GFR 90.64 05/09/2023   CALCIUM 9.3 05/09/2023   PHOS 3.5 07/09/2008   ALBUMIN 4.3 05/09/2023   GLUCOSE 96 05/09/2023    Lab Results  Component Value Date   ALT 17 05/09/2023   AST 10 05/09/2023   ALKPHOS 94 05/09/2023   BILITOT 0.7 05/09/2023    Lab Results  Component Value Date   WBC 2.7 (L) 05/09/2023   HGB 14.6 05/09/2023   HCT 43.5 05/09/2023   MCV 90.0 05/09/2023   PLT 127.0 (L) 05/09/2023   Assessment & Plan:   Problem List Items Addressed This Visit     Benign prostatic hyperplasia   Continue flomax.  Seeing urology s/p prostate MRI discussing biopsy. No records available.       Habitual alcohol use - Primary   Discussed alcohol use - encouraged full cessation or at least limiting alcohol intake. Reviewed risks of alcohol to liver and memory.  Update LFTs, CBC, INR to evaluate for liver disease.  He is worried about dementia - mother has this. Encouraged limiting alcohol as first step for prevention. Reviewed healthy lifestyle changes to support a healthy mind as per patient instructions.       Relevant Orders   Protime-INR   Schizoaffective disorder [facility])   Appreciate psych care.       Abnormal CBC   Update labs - monitoring leukopenia and thrombocytopenia - ?alcohol related and/or vit deficiency related      Relevant Orders   Comprehensive metabolic panel   CBC with Differential/Platelet   Vitamin B1 deficiency   Update levels with daily  B-complex MVI      Relevant Orders   Vitamin B1   Vitamin B12 deficiency   Update levels on B complex vitamin      Relevant Orders   Vitamin B12     No orders of the defined types were placed in this encounter.   Orders Placed This Encounter  Procedures   Comprehensive metabolic panel   CBC with Differential/Platelet   Vitamin B1   Vitamin B12   Protime-INR    Patient Instructions  Labs today  Call [facility] Dr Liliane Shi to follow up on prostate biopsy [phone] We will also request latest urology records.  Cut down on drinking - for overall health.  Good to see you today - return as needed or in 6 months for physical/wellness visit   Work on 4 core lifestyle modifications to support a healthy mind:  1. Nutritious well balance diet.  2. Regular physical activity routine.  3. Regular mental activity such as reading books, word puzzles, math puzzles, jigsaw puzzles.  4. Social engagement.  Also ensure good blood pressure control, limit alcohol, no smoking.   Follow up plan: Return in about 6 months (around 05/16/2024) for annual exam, prior fasting for blood work.  Eustaquio Boyden, MD

## 2023-11-17 NOTE — Assessment & Plan Note (Addendum)
Discussed alcohol use - encouraged full cessation or at least limiting alcohol intake. Reviewed risks of alcohol to liver and memory.  Update LFTs, CBC, INR to evaluate for liver disease.  He is worried about dementia - mother has this. Encouraged limiting alcohol as first step for prevention. Reviewed healthy lifestyle changes to support a healthy mind as per patient instructions.

## 2023-11-17 NOTE — Assessment & Plan Note (Signed)
Continue flomax.  Seeing urology s/p prostate MRI discussing biopsy. No records available.

## 2023-11-17 NOTE — Assessment & Plan Note (Signed)
Appreciate psych care 

## 2023-11-17 NOTE — Assessment & Plan Note (Signed)
Update levels on B complex vitamin

## 2023-11-17 NOTE — Assessment & Plan Note (Signed)
Update labs - monitoring leukopenia and thrombocytopenia - ?alcohol related and/or vit deficiency related

## 2023-11-21 LAB — VITAMIN B1: Vitamin B1 (Thiamine): 122 nmol/L — ABNORMAL HIGH (ref 8–30)

## 2023-11-24 NOTE — Telephone Encounter (Signed)
 Copied from CRM [id number]. Topic: Clinical - Lab/Test Results >> Nov 24, 2023  9:46 AM Tiffany H wrote: Reason for CRM: Patient called to review lab results. Advised of B12 dosing instructions. No further action needed.

## 2023-11-24 NOTE — Telephone Encounter (Signed)
 Noted.

## 2023-12-21 DIAGNOSIS — R972 Elevated prostate specific antigen [PSA]: Secondary | ICD-10-CM | POA: Diagnosis not present

## 2023-12-25 ENCOUNTER — Encounter: Payer: Self-pay | Admitting: Urology

## 2023-12-28 DIAGNOSIS — F250 Schizoaffective disorder, bipolar type: Secondary | ICD-10-CM | POA: Diagnosis not present

## 2023-12-28 DIAGNOSIS — F411 Generalized anxiety disorder: Secondary | ICD-10-CM | POA: Diagnosis not present

## 2024-02-13 DIAGNOSIS — F250 Schizoaffective disorder, bipolar type: Secondary | ICD-10-CM | POA: Diagnosis not present

## 2024-03-28 DIAGNOSIS — F250 Schizoaffective disorder, bipolar type: Secondary | ICD-10-CM | POA: Diagnosis not present

## 2024-03-28 DIAGNOSIS — F411 Generalized anxiety disorder: Secondary | ICD-10-CM | POA: Diagnosis not present

## 2024-04-19 ENCOUNTER — Ambulatory Visit (HOSPITAL_COMMUNITY)
Admission: EM | Admit: 2024-04-19 | Discharge: 2024-04-19 | Disposition: A | Discharge status: Home / Self Care | Attending: Physician Assistant | Admitting: Physician Assistant

## 2024-04-19 ENCOUNTER — Encounter (HOSPITAL_COMMUNITY): Payer: Self-pay | Admitting: Emergency Medicine

## 2024-04-19 DIAGNOSIS — R300 Dysuria: Secondary | ICD-10-CM | POA: Diagnosis not present

## 2024-04-19 DIAGNOSIS — N3001 Acute cystitis with hematuria: Secondary | ICD-10-CM | POA: Insufficient documentation

## 2024-04-19 LAB — POCT URINALYSIS DIP (MANUAL ENTRY)
Bilirubin, UA: NEGATIVE
Glucose, UA: NEGATIVE mg/dL
Ketones, POC UA: NEGATIVE mg/dL
Nitrite, UA: NEGATIVE
Spec Grav, UA: 1.015 (ref 1.010–1.025)
Urobilinogen, UA: 1 U/dL
pH, UA: 8 (ref 5.0–8.0)

## 2024-04-19 MED ORDER — LIDOCAINE HCL (PF) 1 % IJ SOLN
INTRAMUSCULAR | Status: AC
  Filled 2024-04-19: qty 2

## 2024-04-19 MED ORDER — CEFTRIAXONE SODIUM 1 G IJ SOLR
1.0000 g | Freq: Once | INTRAMUSCULAR | Status: AC
  Administered 2024-04-19: 1 g via INTRAMUSCULAR

## 2024-04-19 MED ORDER — CEFTRIAXONE SODIUM 1 G IJ SOLR
INTRAMUSCULAR | Status: AC
  Filled 2024-04-19: qty 10

## 2024-04-19 MED ORDER — SULFAMETHOXAZOLE-TRIMETHOPRIM 800-160 MG PO TABS: 1.0000 | ORAL_TABLET | Freq: Two times a day (BID) | ORAL | 0 refills | Status: DC

## 2024-04-19 NOTE — ED Triage Notes (Signed)
 Pt reports that since Tuesday had pain with urination and having little output when voids.

## 2024-04-19 NOTE — ED Provider Notes (Signed)
 [facility]    CSN: [id number] Arrival date & time: 04/19/24  0801      History   Chief Complaint Chief Complaint  Patient presents with   Dysuria    HPI Robert Fowler is a 73 y.o. male.   Patient presents today for 3-day history of UTI symptoms.  He reports dysuria, urinary frequency, urinary urgency, hematuria.  Denies any fever, nausea, vomiting, back pain, rectal pain.  He does have a history of UTI occurring approximately once a year with similar presentation.  He has a history of BPH but has been compliant with tamsulosin .  Denies history of nephrolithiasis, single kidney, recent urogenital procedure, self-catheterization.  Denies history of diabetes and does not take SGLT2 inhibitor.  He denies any recent antibiotics in the past 90 days.  He denies any penile discharge or genital lesions and has no concern for STI; declines testing.    Past Medical History:  Diagnosis Date   Arthritis    BENIGN PROSTATIC HYPERTROPHY 07/18/2007   Bipolar disorder (HCC)    Habitual alcohol use    Lesion of oral mucosa 03/21/2018   Biopsy 03/2018 [facility]) - INFLAMMATORY FIBROUS HYPERPLASIA CONSISTENT WITH DENTURE INJURY (EPULIS FISSURATUM).    OSTEOPOROSIS 09/04/2007   DEXA -2.8 lumbar (03/2015)   Pes planus    Prediabetes 2014   Schizoaffective disorder (HCC)    h/o paranoid schizophrenia   Wears dentures    full upper and lower    Patient Active Problem List   Diagnosis Date Noted   Abnormal CBC 05/16/2023   Vitamin B1 deficiency 05/16/2023   Vitamin B12 deficiency 05/16/2023   HLD (hyperlipidemia) 05/06/2023   Caregiver stress 11/09/2022   Elevated PSA 05/10/2022   Essential hypertension 04/17/2019   Schizoaffective disorder (HCC)    Medicare annual wellness visit, subsequent 03/14/2018   Nocturnal dyspnea 06/13/2017   Bipolar 1 disorder (HCC) 04/11/2017   Advanced care planning/counseling discussion 03/03/2015   Habitual alcohol use    Health maintenance  examination 01/27/2014   Pes planus 01/11/2008   Osteopenia 09/04/2007   Ex-smoker 09/04/2007   Hematuria 07/18/2007   Benign prostatic hyperplasia 07/18/2007   Pain and swelling of left lower leg 07/18/2007    Past Surgical History:  Procedure Laterality Date   COLONOSCOPY  07/2023   several TAs including 1.2cm one, redundant colon, rpt 3 yrs ([facility])   INGUINAL HERNIA REPAIR Right 08/26/2014   Dr Nalani   MASS EXCISION N/A 04/13/2018   Procedure: EXCISION LOWER LIP ORAL  MASS;  Surgeon: Herminio Miu, MD;  Location: [facility];  Service: ENT;  Laterality: N/A;       Home Medications    Prior to Admission medications   Medication Sig Start Date End Date Taking? Authorizing Provider  sulfamethoxazole-trimethoprim (BACTRIM DS) 800-160 MG tablet Take 1 tablet by mouth 2 (two) times daily for 7 days. 04/19/24 04/26/24 Yes Diangelo Radel K, PA-C  acetaminophen  (TYLENOL ) 500 MG tablet Take 1,000 mg by mouth every 6 (six) hours as needed for moderate pain or headache.    [provider]  amLODipine  (NORVASC ) 2.5 MG tablet Take 1 tablet (2.5 mg total) by mouth daily. 05/16/23   Rilla Baller, MD  aspirin  81 MG tablet Take 81 mg by mouth daily.    [provider]  b complex vitamins capsule Take 1 capsule by mouth daily. 05/16/23   Rilla Baller, MD  benztropine  (COGENTIN ) 0.5 MG tablet Take 0.5 mg by mouth at bedtime.    [provider]  Calcium  Citrate 333 MG TABS Take 1 tablet by mouth daily. 11/09/22   Rilla Baller, MD  Cholecalciferol  (VITAMIN D ) 125 MCG (5000 UT) CAPS Take 1 capsule by mouth daily.    [provider]  divalproex  (DEPAKOTE  ER) 250 MG 24 hr tablet 750 mg at bedtime. 02/23/18   [provider]  FLUoxetine  (PROZAC ) 20 MG capsule Take 20 mg by mouth daily. 02/23/18   [provider]  hydrOXYzine  (ATARAX /VISTARIL ) 25 MG tablet Take 1 tablet (25 mg total) by mouth 2 (two) times daily as needed for anxiety.  As needed 05/07/21   Rilla Baller, MD  OLANZapine  (ZYPREXA ) 10 MG tablet Take 1 tablet (10 mg total) by mouth at bedtime. 06/13/17   Rilla Baller, MD  OLANZapine  zydis (ZYPREXA ) 5 MG disintegrating tablet Take 5 mg by mouth daily. Takes in mornings 10/29/19   [provider]  tamsulosin  (FLOMAX ) 0.4 MG CAPS capsule Take 1 capsule (0.4 mg total) by mouth daily. 05/16/23   Rilla Baller, MD    Family History Family History  Problem Relation Age of Onset   COPD Mother        smoker   Dementia Mother    Alcohol abuse Father    Alcohol abuse Maternal Grandmother    CAD Neg Hx    Stroke Neg Hx    Cancer Neg Hx    Diabetes Neg Hx    Hypertension Neg Hx     Social History Social History   Tobacco Use   Smoking status: Former    Current packs/day: 0.00    Types: Cigarettes    Quit date: 09/26/2006    Years since quitting: 17.5   Smokeless tobacco: Never  Vaping Use   Vaping status: Never Used  Substance Use Topics   Alcohol use: Yes    Comment: occasional   Drug use: Yes    Types: Marijuana    Comment: last smoked 08/22/23     Allergies   Poison oak extract   Review of Systems Review of Systems  Constitutional:  Positive for activity change. Negative for appetite change, fatigue and fever.  Gastrointestinal:  Negative for abdominal pain, diarrhea, nausea and vomiting.  Genitourinary:  Positive for dysuria, frequency, hematuria and urgency. Negative for flank pain, genital sores and penile discharge.  Musculoskeletal:  Negative for arthralgias, back pain and myalgias.     Physical Exam Triage Vital Signs ED Triage Vitals [04/19/24 0815]  Encounter Vitals Group     BP 130/82     Girls Systolic BP Percentile      Girls Diastolic BP Percentile      Boys Systolic BP Percentile      Boys Diastolic BP Percentile      Pulse Rate 91     Resp 18     Temp 98 F (36.7 C)     Temp Source Oral     SpO2 95 %     Weight      Height      Head  Circumference      Peak Flow      Pain Score 10     Pain Loc      Pain Education      Exclude from Growth Chart    No data found.  Updated Vital Signs BP 130/82 (BP Location: Left Arm)   Pulse 91   Temp 98 F (36.7 C) (Oral)   Resp 18   SpO2 95%   Visual Acuity Right Eye  Distance:   Left Eye Distance:   Bilateral Distance:    Right Eye Near:   Left Eye Near:    Bilateral Near:     Physical Exam Vitals reviewed.  Constitutional:      General: He is awake.     Appearance: Normal appearance. He is well-developed. He is not ill-appearing.     Comments: Very pleasant male appears stated age in no acute distress sitting comfortably in exam room  HENT:     Head: Normocephalic and atraumatic.     Mouth/Throat:     Pharynx: Uvula midline. No oropharyngeal exudate or posterior oropharyngeal erythema.  Cardiovascular:     Rate and Rhythm: Normal rate and regular rhythm.     Heart sounds: Normal heart sounds, S1 normal and S2 normal. No murmur heard. Pulmonary:     Effort: Pulmonary effort is normal.     Breath sounds: Normal breath sounds. No stridor. No wheezing, rhonchi or rales.     Comments: Clear to auscultation bilaterally Abdominal:     General: Bowel sounds are normal.     Palpations: Abdomen is soft.     Tenderness: There is no abdominal tenderness. There is no right CVA tenderness, left CVA tenderness, guarding or rebound.     Comments: Benign abdominal exam  Neurological:     Mental Status: He is alert.  Psychiatric:        Behavior: Behavior is cooperative.      UC Treatments / Results  Labs (all labs ordered are listed, but only abnormal results are displayed) Labs Reviewed  POCT URINALYSIS DIP (MANUAL ENTRY) - Abnormal; Notable for the following components:      Result Value   Clarity, UA cloudy (*)    Blood, UA small (*)    Protein Ur, POC trace (*)    Leukocytes, UA Moderate (2+) (*)    All other components within normal limits  URINE CULTURE     EKG   Radiology No results found.  Procedures Procedures (including critical care time)  Medications Ordered in UC Medications  cefTRIAXone (ROCEPHIN) injection 1 g (1 g Intramuscular Given 04/19/24 0834)    Initial Impression / Assessment and Plan / UC Course  I have reviewed the triage vital signs and the nursing notes.  Pertinent labs & imaging results that were available during my care of the patient were reviewed by me and considered in my medical decision making (see chart for details).     Patient is well-appearing, afebrile, nontoxic, nontachycardic.  UA consistent with UTI.  He was given 1 g of Rocephin in clinic and started on Bactrim DS twice daily for 7 days.  We discussed that if he develops any rash or lesions he needs to stop the medication to be seen immediately.  No indication for medication dose adjustment based on metabolic panel from 11/17/2023 with creatinine of 0.75 and calculated creatinine clearance of 117.67 mL/min.  He was encouraged to push fluids.  We will contact him if we need to change or discontinue his antibiotics based on culture results.  He was noted to have some blood in his urine and so I recommended that he follow-up with his primary care in about 2 weeks to have this repeated after clearing of infection to ensure that this resolves.  If he has any worsening symptoms including persistent symptoms despite antibiotics, fever, rectal pain, difficulty passing urine, nausea/vomiting he needs to be seen emergently.  Strict return precautions given.  All questions were answered  to patient satisfaction.  Final Clinical Impressions(s) / UC Diagnoses   Final diagnoses:  Acute cystitis with hematuria  Dysuria     Discharge Instructions      We are treating you for urinary tract infection.  Please take Bactrim DS twice daily for 7 days.  If you develop any rash or oral lesions stop the medication to be seen immediately.  Make sure you rest and drink  plenty of fluid.  I will contact you if need to stop or change your antibiotics based on your culture results. There was some blood noted in your urinalysis and so I recommend that you follow-up with your primary care in 2 to 4 weeks to have this repeated and ensure that it goes away once we have treated the infection. If anything worsens and you have abdominal pain, blood in your urine, nausea, vomiting, fever, weakness, persistent or worsening symptoms you should be seen immediately.       ED Prescriptions     Medication Sig Dispense Auth. Provider   sulfamethoxazole-trimethoprim (BACTRIM DS) 800-160 MG tablet Take 1 tablet by mouth 2 (two) times daily for 7 days. 14 tablet Chai Verdejo K, PA-C      PDMP not reviewed this encounter.   Sherrell Rocky POUR, PA-C 04/19/24 2201

## 2024-04-19 NOTE — Discharge Instructions (Signed)
 We are treating you for urinary tract infection.  Please take Bactrim DS twice daily for 7 days.  If you develop any rash or oral lesions stop the medication to be seen immediately.  Make sure you rest and drink plenty of fluid.  I will contact you if need to stop or change your antibiotics based on your culture results. There was some blood noted in your urinalysis and so I recommend that you follow-up with your primary care in 2 to 4 weeks to have this repeated and ensure that it goes away once we have treated the infection. If anything worsens and you have abdominal pain, blood in your urine, nausea, vomiting, fever, weakness, persistent or worsening symptoms you should be seen immediately.

## 2024-04-20 ENCOUNTER — Other Ambulatory Visit: Payer: Self-pay

## 2024-04-20 ENCOUNTER — Encounter (HOSPITAL_BASED_OUTPATIENT_CLINIC_OR_DEPARTMENT_OTHER): Payer: Self-pay | Admitting: Emergency Medicine

## 2024-04-20 ENCOUNTER — Emergency Department (HOSPITAL_BASED_OUTPATIENT_CLINIC_OR_DEPARTMENT_OTHER)

## 2024-04-20 ENCOUNTER — Observation Stay (HOSPITAL_BASED_OUTPATIENT_CLINIC_OR_DEPARTMENT_OTHER)
Admission: EM | Admit: 2024-04-20 | Discharge: 2024-04-22 | Disposition: A | Discharge status: Home / Self Care | Attending: Internal Medicine | Admitting: Internal Medicine

## 2024-04-20 DIAGNOSIS — N400 Enlarged prostate without lower urinary tract symptoms: Secondary | ICD-10-CM | POA: Diagnosis present

## 2024-04-20 DIAGNOSIS — E871 Hypo-osmolality and hyponatremia: Secondary | ICD-10-CM | POA: Diagnosis not present

## 2024-04-20 DIAGNOSIS — K8020 Calculus of gallbladder without cholecystitis without obstruction: Secondary | ICD-10-CM | POA: Diagnosis not present

## 2024-04-20 DIAGNOSIS — N390 Urinary tract infection, site not specified: Secondary | ICD-10-CM | POA: Diagnosis present

## 2024-04-20 DIAGNOSIS — I10 Essential (primary) hypertension: Secondary | ICD-10-CM | POA: Diagnosis present

## 2024-04-20 DIAGNOSIS — Z79899 Other long term (current) drug therapy: Secondary | ICD-10-CM | POA: Insufficient documentation

## 2024-04-20 DIAGNOSIS — R338 Other retention of urine: Secondary | ICD-10-CM | POA: Diagnosis present

## 2024-04-20 DIAGNOSIS — F259 Schizoaffective disorder, unspecified: Secondary | ICD-10-CM | POA: Diagnosis present

## 2024-04-20 DIAGNOSIS — F250 Schizoaffective disorder, bipolar type: Secondary | ICD-10-CM | POA: Diagnosis present

## 2024-04-20 DIAGNOSIS — Z87891 Personal history of nicotine dependence: Secondary | ICD-10-CM | POA: Insufficient documentation

## 2024-04-20 DIAGNOSIS — Z7982 Long term (current) use of aspirin: Secondary | ICD-10-CM | POA: Diagnosis not present

## 2024-04-20 DIAGNOSIS — R109 Unspecified abdominal pain: Secondary | ICD-10-CM | POA: Diagnosis not present

## 2024-04-20 DIAGNOSIS — N3289 Other specified disorders of bladder: Secondary | ICD-10-CM | POA: Diagnosis not present

## 2024-04-20 DIAGNOSIS — R339 Retention of urine, unspecified: Secondary | ICD-10-CM | POA: Diagnosis not present

## 2024-04-20 LAB — URINALYSIS, ROUTINE W REFLEX MICROSCOPIC
Bilirubin Urine: NEGATIVE
Glucose, UA: NEGATIVE mg/dL
Hgb urine dipstick: NEGATIVE
Nitrite: NEGATIVE
Protein, ur: NEGATIVE mg/dL
Specific Gravity, Urine: 1.009 (ref 1.005–1.030)
pH: 7 (ref 5.0–8.0)

## 2024-04-20 LAB — BASIC METABOLIC PANEL WITH GFR
Anion gap: 12 (ref 5–15)
BUN: 6 mg/dL — ABNORMAL LOW (ref 8–23)
CO2: 22 mmol/L (ref 22–32)
Calcium: 9.3 mg/dL (ref 8.9–10.3)
Chloride: 97 mmol/L — ABNORMAL LOW (ref 98–111)
Creatinine, Ser: 0.68 mg/dL (ref 0.61–1.24)
GFR, Estimated: >60 mL/min (ref >60)
Glucose, Bld: 120 mg/dL — ABNORMAL HIGH (ref 70–99)
Potassium: 3.5 mmol/L (ref 3.5–5.1)
Sodium: 131 mmol/L — ABNORMAL LOW (ref 135–145)

## 2024-04-20 LAB — COMPREHENSIVE METABOLIC PANEL WITH GFR
ALT: 43 U/L (ref 0–44)
AST: 30 U/L (ref 15–41)
Albumin: 4.6 g/dL (ref 3.5–5.0)
Alkaline Phosphatase: 131 U/L — ABNORMAL HIGH (ref 38–126)
Anion gap: 16 — ABNORMAL HIGH (ref 5–15)
BUN: 6 mg/dL — ABNORMAL LOW (ref 8–23)
CO2: 20 mmol/L — ABNORMAL LOW (ref 22–32)
Calcium: 9.9 mg/dL (ref 8.9–10.3)
Chloride: 87 mmol/L — ABNORMAL LOW (ref 98–111)
Creatinine, Ser: 0.78 mg/dL (ref 0.61–1.24)
GFR, Estimated: >60 mL/min (ref >60)
Glucose, Bld: 115 mg/dL — ABNORMAL HIGH (ref 70–99)
Potassium: 3.9 mmol/L (ref 3.5–5.1)
Sodium: 123 mmol/L — ABNORMAL LOW (ref 135–145)
Total Bilirubin: 0.9 mg/dL (ref 0.0–1.2)
Total Protein: 7.4 g/dL (ref 6.5–8.1)

## 2024-04-20 LAB — CBC WITH DIFFERENTIAL/PLATELET
Abs Immature Granulocytes: 0.05 K/uL (ref 0.00–0.07)
Basophils Absolute: 0 K/uL (ref 0.0–0.1)
Basophils Relative: 0 %
Eosinophils Absolute: 0 K/uL (ref 0.0–0.5)
Eosinophils Relative: 0 %
HCT: 36.9 % — ABNORMAL LOW (ref 39.0–52.0)
Hemoglobin: 13.7 g/dL (ref 13.0–17.0)
Immature Granulocytes: 1 %
Lymphocytes Relative: 8 %
Lymphs Abs: 0.6 K/uL — ABNORMAL LOW (ref 0.7–4.0)
MCH: 32.5 pg (ref 26.0–34.0)
MCHC: 37.1 g/dL — ABNORMAL HIGH (ref 30.0–36.0)
MCV: 87.4 fL (ref 80.0–100.0)
Monocytes Absolute: 0.9 K/uL (ref 0.1–1.0)
Monocytes Relative: 11 %
Neutro Abs: 6 K/uL (ref 1.7–7.7)
Neutrophils Relative %: 80 %
Platelets: 170 K/uL (ref 150–400)
RBC: 4.22 MIL/uL (ref 4.22–5.81)
RDW: 12.8 % (ref 11.5–15.5)
WBC: 7.5 K/uL (ref 4.0–10.5)
nRBC: 0 % (ref 0.0–0.2)

## 2024-04-20 LAB — LIPASE, BLOOD: Lipase: 16 U/L (ref 11–51)

## 2024-04-20 MED ORDER — AMLODIPINE BESYLATE 5 MG PO TABS
2.5000 mg | ORAL_TABLET | Freq: Every day | ORAL | Status: DC
  Administered 2024-04-21 – 2024-04-22 (×2): 2.5 mg via ORAL
  Filled 2024-04-20 (×2): qty 1

## 2024-04-20 MED ORDER — CHLORHEXIDINE GLUCONATE CLOTH 2 % EX PADS
6.0000 | MEDICATED_PAD | Freq: Every day | CUTANEOUS | Status: DC
  Administered 2024-04-20 – 2024-04-22 (×3): 6 via TOPICAL

## 2024-04-20 MED ORDER — POTASSIUM CHLORIDE CRYS ER 20 MEQ PO TBCR
40.0000 meq | EXTENDED_RELEASE_TABLET | Freq: Once | ORAL | Status: AC
  Administered 2024-04-20: 40 meq via ORAL
  Filled 2024-04-20: qty 2

## 2024-04-20 MED ORDER — VANCOMYCIN HCL 1250 MG/250ML IV SOLN
1250.0000 mg | Freq: Two times a day (BID) | INTRAVENOUS | Status: DC
  Administered 2024-04-20 – 2024-04-22 (×4): 1250 mg via INTRAVENOUS
  Filled 2024-04-20 (×4): qty 250

## 2024-04-20 MED ORDER — MORPHINE SULFATE (PF) 4 MG/ML IV SOLN: 4.0000 mg | INTRAVENOUS | Status: DC | PRN

## 2024-04-20 MED ORDER — OLANZAPINE 10 MG PO TBDP
10.0000 mg | ORAL_TABLET | Freq: Every day | ORAL | Status: DC
  Administered 2024-04-20 – 2024-04-21 (×2): 10 mg via ORAL
  Filled 2024-04-20 (×2): qty 1

## 2024-04-20 MED ORDER — SODIUM CHLORIDE 0.9 % IV SOLN: INTRAVENOUS | Status: DC

## 2024-04-20 MED ORDER — BENZTROPINE MESYLATE 0.5 MG PO TABS: 0.5000 mg | ORAL_TABLET | Freq: Every day | ORAL | Status: DC

## 2024-04-20 MED ORDER — OLANZAPINE 5 MG PO TBDP
5.0000 mg | ORAL_TABLET | Freq: Every day | ORAL | Status: DC
  Administered 2024-04-21 – 2024-04-22 (×2): 5 mg via ORAL
  Filled 2024-04-20 (×2): qty 1

## 2024-04-20 MED ORDER — ASPIRIN 81 MG PO TBEC
81.0000 mg | DELAYED_RELEASE_TABLET | Freq: Every day | ORAL | Status: DC
  Administered 2024-04-21 – 2024-04-22 (×2): 81 mg via ORAL
  Filled 2024-04-20 (×2): qty 1

## 2024-04-20 MED ORDER — SODIUM CHLORIDE 0.9 % IV SOLN
2.0000 g | Freq: Once | INTRAVENOUS | Status: AC
  Administered 2024-04-20: 2 g via INTRAVENOUS
  Filled 2024-04-20: qty 20

## 2024-04-20 MED ORDER — ENOXAPARIN SODIUM 40 MG/0.4ML IJ SOSY
40.0000 mg | PREFILLED_SYRINGE | INTRAMUSCULAR | Status: DC
  Administered 2024-04-20 – 2024-04-22 (×3): 40 mg via SUBCUTANEOUS
  Filled 2024-04-20 (×3): qty 0.4

## 2024-04-20 MED ORDER — ONDANSETRON HCL 4 MG/2ML IJ SOLN
4.0000 mg | Freq: Four times a day (QID) | INTRAMUSCULAR | Status: DC | PRN
  Administered 2024-04-20: 4 mg via INTRAVENOUS
  Filled 2024-04-20: qty 2

## 2024-04-20 MED ORDER — ACETAMINOPHEN 325 MG PO TABS: 650.0000 mg | ORAL_TABLET | Freq: Four times a day (QID) | ORAL | Status: DC | PRN

## 2024-04-20 MED ORDER — ACETAMINOPHEN 325 MG PO TABS
650.0000 mg | ORAL_TABLET | Freq: Every day | ORAL | Status: DC
  Administered 2024-04-20 – 2024-04-21 (×2): 650 mg via ORAL
  Filled 2024-04-20 (×2): qty 2

## 2024-04-20 MED ORDER — FLUOXETINE HCL 20 MG PO CAPS
20.0000 mg | ORAL_CAPSULE | Freq: Every day | ORAL | Status: DC
  Administered 2024-04-21 – 2024-04-22 (×2): 20 mg via ORAL
  Filled 2024-04-20 (×2): qty 1

## 2024-04-20 MED ORDER — ORAL CARE MOUTH RINSE: 15.0000 mL | OROMUCOSAL | Status: DC | PRN

## 2024-04-20 MED ORDER — ACETAMINOPHEN 650 MG RE SUPP: 650.0000 mg | Freq: Four times a day (QID) | RECTAL | Status: DC | PRN

## 2024-04-20 MED ORDER — ONDANSETRON HCL 4 MG PO TABS: 4.0000 mg | ORAL_TABLET | Freq: Four times a day (QID) | ORAL | Status: DC | PRN

## 2024-04-20 MED ORDER — TAMSULOSIN HCL 0.4 MG PO CAPS
0.4000 mg | ORAL_CAPSULE | Freq: Every day | ORAL | Status: DC
  Administered 2024-04-21 – 2024-04-22 (×2): 0.4 mg via ORAL
  Filled 2024-04-20 (×2): qty 1

## 2024-04-20 MED ORDER — DIVALPROEX SODIUM ER 500 MG PO TB24
750.0000 mg | ORAL_TABLET | Freq: Every day | ORAL | Status: DC
  Administered 2024-04-20 – 2024-04-21 (×2): 750 mg via ORAL
  Filled 2024-04-20 (×2): qty 1

## 2024-04-20 NOTE — Progress Notes (Signed)
 Pharmacy Antibiotic Note  Robert Fowler is a 73 y.o. male admitted on 04/20/2024 with UTI.  Pharmacy has been consulted for Vanco dosing.  ID: UTI.   7/26 Rocephin  7/26: Vanco>>  7/25: UCx: E. Faecalis  Plan: Vancomycin  1250 mg IV Q 12 hrs. Goal AUC 400-550. Expected AUC: 491 SCr used: 0.8    Height: 5' 10 (177.8 cm) Weight: 93 kg (205 lb) IBW/kg (Calculated) : 73  Temp (24hrs), Avg:98 F (36.7 C), Min:97.6 F (36.4 C), Max:98.3 F (36.8 C)  Recent Labs  Lab 04/20/24 1126  WBC 7.5  CREATININE 0.78    Estimated Creatinine Clearance: 95.6 mL/min (by C-G formula based on SCr of 0.78 mg/dL).    Allergies  Allergen Reactions   Poison Oak Extract Rash and Other (See Comments)    Broke out severely    Robert Fowler, PharmD, BCPS Clinical Staff Pharmacist  Robert Fowler 04/20/2024 5:59 PM

## 2024-04-20 NOTE — ED Triage Notes (Signed)
 Pt arrived POV with c/o bladder pain for a few days, unable to urinate since yesterday. Seen UC 7/25 and received IM ABX and PO ABX.

## 2024-04-20 NOTE — Plan of Care (Signed)
 Educated regarding infection prevention with urinary catheter.

## 2024-04-20 NOTE — Progress Notes (Signed)
 Plan of Care Note for accepted transfer   Patient: Robert Fowler MRN: [id number]   DOA: 04/20/2024  Facility requesting transfer: [facility] ED. Requesting Provider: [name], MD. Reason for transfer: Hyponatremia, urinary retention and UTI. Facility course:   Per Dr. Medicus:  73 year old male with past medical history of hypertension as well as BPH presenting to the emergency department today with concerns for urinary retention.  The patient states he was having UTI symptoms earlier this week including some blood in his urine as well as increased urinary frequency and urgency.  He went to urgent care yesterday and received oral antibiotics and was given an IM shot of antibiotics prior to discharge.  He states that when he got home he started having difficulty urinating.  He states that he was been unable to urinate now for the past 14 to 15 hours.  He is having a lot of discomfort secondary to this.  Never had any issues with urinary retention in the past.  He denies any back pain.  Foley catheter was placed, the patient was started on ceftriaxone , urine sodium and urine osmolality pending.  Plan of care: The patient is accepted for admission to Progressive unit, at [facility].  Author: Alm Dorn Castor, MD 04/20/2024  Check [email or web address] for on-call coverage.  Nursing staff, Please call [facility] Admits & Consults System-Wide number on Amion as soon as patient's arrival, so appropriate admitting provider can evaluate the pt.

## 2024-04-20 NOTE — ED Provider Notes (Signed)
 [place] [facility] AT [facility] Provider Note   CSN: [id number] Arrival date & time: 04/20/24  1020     Patient presents with: Urinary Retention   Robert Fowler is a 73 y.o. male.   73 year old male with past medical history of hypertension as well as BPH presenting to the emergency department today with concerns for urinary retention.  The patient states he was having UTI symptoms earlier this week including some blood in his urine as well as increased urinary frequency and urgency.  He went to urgent care yesterday and received oral antibiotics and was given an IM shot of antibiotics prior to discharge.  He states that when he got home he started having difficulty urinating.  He states that he was been unable to urinate now for the past 14 to 15 hours.  He is having a lot of discomfort secondary to this.  Never had any issues with urinary retention in the past.  He denies any back pain.        Prior to Admission medications   Medication Sig Start Date End Date Taking? Authorizing Provider  acetaminophen  (TYLENOL ) 500 MG tablet Take 1,000 mg by mouth every 6 (six) hours as needed for moderate pain or headache.    [provider]  amLODipine  (NORVASC ) 2.5 MG tablet Take 1 tablet (2.5 mg total) by mouth daily. 05/16/23   Rilla Baller, MD  aspirin  81 MG tablet Take 81 mg by mouth daily.    [provider]  b complex vitamins capsule Take 1 capsule by mouth daily. 05/16/23   Rilla Baller, MD  benztropine  (COGENTIN ) 0.5 MG tablet Take 0.5 mg by mouth at bedtime.    [provider]  Calcium  Citrate 333 MG TABS Take 1 tablet by mouth daily. 11/09/22   Rilla Baller, MD  Cholecalciferol  (VITAMIN D ) 125 MCG (5000 UT) CAPS Take 1 capsule by mouth daily.    [provider]  divalproex  (DEPAKOTE  ER) 250 MG 24 hr tablet 750 mg at bedtime. 02/23/18   [provider]  FLUoxetine  (PROZAC ) 20 MG capsule Take 20 mg by mouth  daily. 02/23/18   [provider]  hydrOXYzine  (ATARAX /VISTARIL ) 25 MG tablet Take 1 tablet (25 mg total) by mouth 2 (two) times daily as needed for anxiety. As needed 05/07/21   Rilla Baller, MD  OLANZapine  (ZYPREXA ) 10 MG tablet Take 1 tablet (10 mg total) by mouth at bedtime. 06/13/17   Rilla Baller, MD  OLANZapine  zydis (ZYPREXA ) 5 MG disintegrating tablet Take 5 mg by mouth daily. Takes in mornings 10/29/19   [provider]  sulfamethoxazole -trimethoprim  (BACTRIM  DS) 800-160 MG tablet Take 1 tablet by mouth 2 (two) times daily for 7 days. 04/19/24 04/26/24  Raspet, Erin K, PA-C  tamsulosin  (FLOMAX ) 0.4 MG CAPS capsule Take 1 capsule (0.4 mg total) by mouth daily. 05/16/23   Rilla Baller, MD    Allergies: Poison oak extract    Review of Systems  Genitourinary:  Positive for difficulty urinating.  All other systems reviewed and are negative.   Updated Vital Signs BP (!) 154/96 (BP Location: Right Arm)   Pulse 92   Temp 98.2 F (36.8 C) (Oral)   Resp 16   Ht 5' 10 (1.778 m)   Wt 93 kg   SpO2 98%   BMI 29.41 kg/m   Physical Exam Vitals and nursing note reviewed.   Gen: Appears uncomfortable, pacing around the room Eyes: PERRL, EOMI HEENT: no oropharyngeal swelling Neck: trachea midline  Resp: clear to auscultation bilaterally Card: RRR, no murmurs, rubs, or gallops Abd: nontender, nondistended, bilateral CVA tenderness noted, suprapubic tenderness noted with no guarding or rebound Extremities: no calf tenderness, no edema Vascular: 2+ radial pulses bilaterally, 2+ DP pulses bilaterally Skin: no rashes Psyc: acting appropriately   (all labs ordered are listed, but only abnormal results are displayed) Labs Reviewed  CBC WITH DIFFERENTIAL/PLATELET - Abnormal; Notable for the following components:      Result Value   HCT 36.9 (*)    MCHC 37.1 (*)    Lymphs Abs 0.6 (*)    All other components within normal limits  COMPREHENSIVE METABOLIC PANEL  WITH GFR - Abnormal; Notable for the following components:   Sodium 123 (*)    Chloride 87 (*)    CO2 20 (*)    Glucose, Bld 115 (*)    BUN 6 (*)    Alkaline Phosphatase 131 (*)    Anion gap 16 (*)    All other components within normal limits  URINALYSIS, ROUTINE W REFLEX MICROSCOPIC - Abnormal; Notable for the following components:   Ketones, ur TRACE (*)    Leukocytes,Ua MODERATE (*)    Bacteria, UA RARE (*)    All other components within normal limits  LIPASE, BLOOD  SODIUM, URINE, RANDOM  CREATININE, URINE, RANDOM    EKG: None  Radiology: CT ABDOMEN PELVIS WO CONTRAST Result Date: 04/20/2024 CLINICAL DATA:  Acute nonlocalized abdominal pain. EXAM: CT ABDOMEN AND PELVIS WITHOUT CONTRAST TECHNIQUE: Multidetector CT imaging of the abdomen and pelvis was performed following the standard protocol without IV contrast. RADIATION DOSE REDUCTION: This exam was performed according to the departmental dose-optimization program which includes automated exposure control, adjustment of the mA and/or kV according to patient size and/or use of iterative reconstruction technique. COMPARISON:  05/09/2005 FINDINGS: Lower chest: No acute findings. Hepatobiliary: Scattered tiny hypodensities in the liver parenchyma are too small to characterize but are statistically most likely benign. No followup imaging is recommended. Calcified gallstones measure up to about 19 mm diameter. No intrahepatic or extrahepatic biliary dilation. Pancreas: No focal mass lesion. No dilatation of the main duct. No intraparenchymal cyst. No peripancreatic edema. Spleen: No splenomegaly. No suspicious focal mass lesion. Adrenals/Urinary Tract: No adrenal nodule or mass. Tiny well-defined homogeneous low-density lesions in both kidneys are too small to characterize but are statistically most likely benign and probably cysts. No followup imaging is recommended. No evidence for hydroureter. Bladder is markedly distended. Stomach/Bowel:  Stomach is distended with gas. Duodenum is normally positioned as is the ligament of Treitz. No small bowel wall thickening. No small bowel dilatation. The terminal ileum is normal. The appendix is normal. No gross colonic mass. No colonic wall thickening. Vascular/Lymphatic: There is moderate atherosclerotic calcification of the abdominal aorta without aneurysm. There is no gastrohepatic or hepatoduodenal ligament lymphadenopathy. No retroperitoneal or mesenteric lymphadenopathy. No pelvic sidewall lymphadenopathy. Reproductive: Prostate gland is markedly enlarged. Other: No intraperitoneal free fluid. Musculoskeletal: No worrisome lytic or sclerotic osseous abnormality. IMPRESSION: 1. Marked prostatomegaly with marked distention of the bladder. Dysmotility or bladder outlet obstruction could cause this appearance. 2. Cholelithiasis. 3.  Aortic Atherosclerosis (ICD10-I70.0). Electronically Signed   By: Camellia Candle M.D.   On: 04/20/2024 12:08     Procedures   Medications Ordered in the ED  enoxaparin  (LOVENOX ) injection 40 mg (has no administration in time range)  acetaminophen  (TYLENOL ) tablet 650 mg (has no administration in time range)    Or  acetaminophen  (TYLENOL ) suppository 650 mg (  has no administration in time range)  0.9 %  sodium chloride  infusion ( Intravenous New Bag/Given 04/20/24 1504)  ondansetron  (ZOFRAN ) tablet 4 mg (has no administration in time range)    Or  ondansetron  (ZOFRAN ) injection 4 mg (has no administration in time range)  Chlorhexidine  Gluconate Cloth 2 % PADS 6 each (has no administration in time range)  Oral care mouth rinse (has no administration in time range)  cefTRIAXone  (ROCEPHIN ) 2 g in sodium chloride  0.9 % 100 mL IVPB (2 g Intravenous New Bag/Given 04/20/24 1324)                                    Medical Decision Making 73 year old male with past medical history of hypertension and BPH presenting to the emergency department today with urinary retention.   I will place a Foley catheter.  Will obtain basic labs to evaluate for AKI.  Also obtain a CT scan without contrast to evaluate for obstructive lesions or ureterolithiasis given the patient's complaint of the blood in his urine earlier this week.  I will reevaluate for ultimate disposition.  The patient's CT scan shows significant BPH.  Urinalysis does show findings concerning for infection.  The patient does have significant hyponatremia here.  He is mentating clearly.  I spoke with Dr. Celinda who will admit the patient for further evaluation.  Amount and/or Complexity of Data Reviewed Labs: ordered. Radiology: ordered.  Risk Decision regarding hospitalization.        Final diagnoses:  Hyponatremia  Acute urinary retention    ED Discharge Orders     None          Ula Prentice SAUNDERS, MD 04/20/24 1513

## 2024-04-20 NOTE — H&P (Signed)
 History and Physical    Patient: Robert Fowler FMW:[id number] DOB: [birth year] DOA: 04/20/2024 DOS: the patient was seen and examined on 04/20/2024 PCP: Rilla Baller, MD  Patient coming from: Home  Chief Complaint:  Chief Complaint  Patient presents with   Urinary Retention   HPI: Robert Fowler is a 73 y.o. male with medical history significant of osteoarthritis, BPH, bipolar disorder, have a show alcohol use, oral mucosal lesion, osteoporosis, pes planus, prediabetes, hyperlipidemia, osteopenia, vitamin B1 deficiency, vitamin B12 deficiency, schizoaffective disorder, history of paranoid schizophrenia, essential hypertension with history of BPH and elevated PSA who presented to the emergency department this morning due to urinary retention.  Per patient, he felt like he could not urinate yesterday and started drinking water.  He is not sure about how much water he had, but he drank more water than he normally does.  He has has some suprapubic tenderness.  No flank pain, dysuria, frequency or hematuria.He denied fever, chills, rhinorrhea, sore throat, wheezing or hemoptysis.  No chest pain, palpitations, diaphoresis, PND, orthopnea or pitting edema of the lower extremities.  No abdominal pain, nausea, emesis, diarrhea, constipation, melena or hematochezia.   No polyuria, polydipsia, polyphagia or blurred vision.   Lab work: CBC showed a white count of 7.5, hemoglobin 13.7 g/dL and platelets 829.  Normal lipase level.  CMP showed a sodium 123, potassium 3.9, chloride 87 and CO2 20 mmol/L.  Glucose 115, BUN 6 and creatinine 0.78 mg/dL.  Normal calcium  level.  LFTs were unremarkable with the assassin of a mildly elevated alkaline phosphatase at 131 units/L.  Imaging: CT abdomen/pelvis with contrast showed mild prostatomegaly with mild distention of the bladder.  Dysmotility or bladder outlet obstruction could cause this appearance.  Cholelithiasis.  Aortic atherosclerosis.   ED course: Initial vital  signs were temperature 97.6 F, pulse 95, respiration 18, BP 126/79 mmHg O2 sat 94% on room air.  The patient had a Foley catheter insertion and was given ceftriaxone  2 g IVPB x 1.  Review of Systems: As mentioned in the history of present illness. All other systems reviewed and are negative. Past Medical History:  Diagnosis Date   Arthritis    BENIGN PROSTATIC HYPERTROPHY 07/18/2007   Bipolar disorder (HCC)    Habitual alcohol use    Lesion of oral mucosa 03/21/2018   Biopsy 03/2018 [facility]) - INFLAMMATORY FIBROUS HYPERPLASIA CONSISTENT WITH DENTURE INJURY (EPULIS FISSURATUM).    OSTEOPOROSIS 09/04/2007   DEXA -2.8 lumbar (03/2015)   Pes planus    Prediabetes 2014   Schizoaffective disorder (HCC)    h/o paranoid schizophrenia   Wears dentures    full upper and lower   Past Surgical History:  Procedure Laterality Date   COLONOSCOPY  07/2023   several TAs including 1.2cm one, redundant colon, rpt 3 yrs ([facility])   INGUINAL HERNIA REPAIR Right 08/26/2014   Dr Nalani   MASS EXCISION N/A 04/13/2018   Procedure: EXCISION LOWER LIP ORAL  MASS;  Surgeon: Herminio Miu, MD;  Location: [facility];  Service: ENT;  Laterality: N/A;   Social History:  reports that he quit smoking about 17 years ago. His smoking use included cigarettes. He has never used smokeless tobacco. He reports current alcohol use. He reports current drug use. Drug: Marijuana.  Allergies  Allergen Reactions   Poison Oak Extract     Family History  Problem Relation Age of Onset   COPD Mother        smoker   Dementia Mother  Alcohol abuse Father    Alcohol abuse Maternal Grandmother    CAD Neg Hx    Stroke Neg Hx    Cancer Neg Hx    Diabetes Neg Hx    Hypertension Neg Hx     Prior to Admission medications   Medication Sig Start Date End Date Taking? Authorizing Provider  acetaminophen  (TYLENOL ) 500 MG tablet Take 1,000 mg by mouth every 6 (six) hours as needed for moderate pain or headache.     [provider]  amLODipine  (NORVASC ) 2.5 MG tablet Take 1 tablet (2.5 mg total) by mouth daily. 05/16/23   Rilla Baller, MD  aspirin  81 MG tablet Take 81 mg by mouth daily.    [provider]  b complex vitamins capsule Take 1 capsule by mouth daily. 05/16/23   Rilla Baller, MD  benztropine  (COGENTIN ) 0.5 MG tablet Take 0.5 mg by mouth at bedtime.    [provider]  Calcium  Citrate 333 MG TABS Take 1 tablet by mouth daily. 11/09/22   Rilla Baller, MD  Cholecalciferol  (VITAMIN D ) 125 MCG (5000 UT) CAPS Take 1 capsule by mouth daily.    [provider]  divalproex  (DEPAKOTE  ER) 250 MG 24 hr tablet 750 mg at bedtime. 02/23/18   [provider]  FLUoxetine  (PROZAC ) 20 MG capsule Take 20 mg by mouth daily. 02/23/18   [provider]  hydrOXYzine  (ATARAX /VISTARIL ) 25 MG tablet Take 1 tablet (25 mg total) by mouth 2 (two) times daily as needed for anxiety. As needed 05/07/21   Rilla Baller, MD  OLANZapine  (ZYPREXA ) 10 MG tablet Take 1 tablet (10 mg total) by mouth at bedtime. 06/13/17   Rilla Baller, MD  OLANZapine  zydis (ZYPREXA ) 5 MG disintegrating tablet Take 5 mg by mouth daily. Takes in mornings 10/29/19   [provider]  sulfamethoxazole -trimethoprim  (BACTRIM  DS) 800-160 MG tablet Take 1 tablet by mouth 2 (two) times daily for 7 days. 04/19/24 04/26/24  Raspet, Rocky POUR, PA-C  tamsulosin  (FLOMAX ) 0.4 MG CAPS capsule Take 1 capsule (0.4 mg total) by mouth daily. 05/16/23   Rilla Baller, MD    Physical Exam: Vitals:   04/20/24 1130 04/20/24 1300 04/20/24 1329 04/20/24 1330  BP: (!) 160/90 (!) 149/88  (!) 147/97  Pulse: 89 76  83  Resp:    19  Temp:   98.3 F (36.8 C)   TempSrc:   Oral   SpO2: 99% 96%  96%  Weight:      Height:       Physical Exam Vitals and nursing note reviewed.  Constitutional:      General: He is awake. He is not in acute distress.    Appearance: Normal appearance. He is ill-appearing.   HENT:     Head: Normocephalic.     Nose: No rhinorrhea.     Mouth/Throat:     Mouth: Mucous membranes are moist.  Eyes:     General: No scleral icterus.    Pupils: Pupils are equal, round, and reactive to light.  Neck:     Vascular: No JVD.  Cardiovascular:     Rate and Rhythm: Normal rate and regular rhythm.     Heart sounds: S1 normal and S2 normal.  Pulmonary:     Effort: Pulmonary effort is normal.     Breath sounds: Normal breath sounds. No wheezing, rhonchi or rales.  Abdominal:     General: Bowel sounds are normal. There is no distension.     Palpations: Abdomen is soft.  Tenderness: There is abdominal tenderness in the suprapubic area. There is no right CVA tenderness or left CVA tenderness.  Musculoskeletal:     Cervical back: Neck supple.     Right lower leg: No edema.     Left lower leg: No edema.  Skin:    General: Skin is warm and dry.  Neurological:     General: No focal deficit present.     Mental Status: He is alert and oriented to person, place, and time.  Psychiatric:        Mood and Affect: Mood normal.        Behavior: Behavior normal. Behavior is cooperative.     Data Reviewed:  Results are pending, will review when available.  Assessment and Plan:  Principal Problem:   Hyponatremia Secondary to water intake. Observation/PCU. Continue IV fluids. Regular diet. Free water restriction. Follow sodium level. Check CBC, CMP in AM.  Active Problems:   Acute urinary retention In the setting of:   Benign prostatic hyperplasia Associated with:   Acute UTI (urinary tract infection) Will hold hydroxyzine  as needed. Will hold nocturnal benztropine  for now. Urine culture and sensitivity growing Enterococcus. Will discontinue ceftriaxone . Begin vancomycin  per pharmacy for now. Will follow-up culture results tomorrow. De-escalate to ampicillin if sensitive to it. Urology will be seeing in the morning.    Schizoaffective disorder  (HCC) Continue Depakote  ER 750 mg p.o. bedtime. Continue fluoxetine  20 mg p.o. in the morning. Continue olanzapine  5 mg p.o. in the morning. Continue olanzapine  10 mg p.o. at bedtime.    Essential hypertension Continue amlodipine  2.5 mg p.o. daily.       Advance Care Planning:   Code Status: Full Code   Consults:   Family Communication:   Severity of Illness: The appropriate patient status for this patient is OBSERVATION. Observation status is judged to be reasonable and necessary in order to provide the required intensity of service to ensure the patient's safety. The patient's presenting symptoms, physical exam findings, and initial radiographic and laboratory data in the context of their medical condition is felt to place them at decreased risk for further clinical deterioration. Furthermore, it is anticipated that the patient will be medically stable for discharge from the hospital within 2 midnights of admission.   Author: Alm Dorn Castor, MD 04/20/2024 2:30 PM  For on call review [email or web address].   This document was prepared using Dragon voice recognition software and may contain some unintended transcription errors.

## 2024-04-20 NOTE — ED Notes (Signed)
 Called [facility] to transport patient to [facility] rm# [id number]

## 2024-04-21 DIAGNOSIS — E871 Hypo-osmolality and hyponatremia: Secondary | ICD-10-CM | POA: Diagnosis not present

## 2024-04-21 LAB — COMPREHENSIVE METABOLIC PANEL WITH GFR
ALT: 46 U/L — ABNORMAL HIGH (ref 0–44)
AST: 41 U/L (ref 15–41)
Albumin: 3.2 g/dL — ABNORMAL LOW (ref 3.5–5.0)
Alkaline Phosphatase: 89 U/L (ref 38–126)
Anion gap: 9 (ref 5–15)
BUN: 6 mg/dL — ABNORMAL LOW (ref 8–23)
CO2: 21 mmol/L — ABNORMAL LOW (ref 22–32)
Calcium: 8.7 mg/dL — ABNORMAL LOW (ref 8.9–10.3)
Chloride: 101 mmol/L (ref 98–111)
Creatinine, Ser: 0.75 mg/dL (ref 0.61–1.24)
GFR, Estimated: >60 mL/min (ref >60)
Glucose, Bld: 92 mg/dL (ref 70–99)
Potassium: 3.9 mmol/L (ref 3.5–5.1)
Sodium: 131 mmol/L — ABNORMAL LOW (ref 135–145)
Total Bilirubin: 1.1 mg/dL (ref 0.0–1.2)
Total Protein: 6.2 g/dL — ABNORMAL LOW (ref 6.5–8.1)

## 2024-04-21 LAB — CBC
HCT: 36.2 % — ABNORMAL LOW (ref 39.0–52.0)
Hemoglobin: 12.7 g/dL — ABNORMAL LOW (ref 13.0–17.0)
MCH: 32 pg (ref 26.0–34.0)
MCHC: 35.1 g/dL (ref 30.0–36.0)
MCV: 91.2 fL (ref 80.0–100.0)
Platelets: 163 K/uL (ref 150–400)
RBC: 3.97 MIL/uL — ABNORMAL LOW (ref 4.22–5.81)
RDW: 12.9 % (ref 11.5–15.5)
WBC: 5.2 K/uL (ref 4.0–10.5)
nRBC: 0 % (ref 0.0–0.2)

## 2024-04-21 LAB — URINE CULTURE: Culture: 60000 — AB

## 2024-04-21 LAB — MAGNESIUM: Magnesium: 2.1 mg/dL (ref 1.7–2.4)

## 2024-04-21 MED ORDER — TADALAFIL 5 MG PO TABS
5.0000 mg | ORAL_TABLET | Freq: Every day | ORAL | Status: DC
  Administered 2024-04-21 – 2024-04-22 (×2): 5 mg via ORAL
  Filled 2024-04-21 (×2): qty 1

## 2024-04-21 NOTE — Progress Notes (Signed)
 Mobility Specialist - Progress Note   04/21/24 1541  Mobility  Activity Ambulated with assistance in hallway  Level of Assistance Modified independent, requires aide device or extra time  Assistive Device Front wheel walker  Distance Ambulated (ft) 700 ft  Range of Motion/Exercises Active  Activity Response Tolerated well  Mobility Referral Yes  Mobility visit 1 Mobility  Mobility Specialist Start Time (ACUTE ONLY) 1530  Mobility Specialist Stop Time (ACUTE ONLY) 1541  Mobility Specialist Time Calculation (min) (ACUTE ONLY) 11 min   Pt was found in bed and agreeble to ambulate. No complaints and returned ot bed with all needs met. Call bell in reach.  Erminio Leos,  Mobility Specialist Can be reached via Secure Chat

## 2024-04-21 NOTE — TOC Initial Note (Addendum)
 Transition of Care [facility]) - Initial/Assessment Note    Patient Details  Name: Robert Fowler MRN: [id number] Date of Birth: 1950/12/11  [facility] CM/SW Contact:    Heather DELENA Saltness, LCSW Phone Number: 04/21/2024, 11:10 AM  Clinical Narrative:                 TOC consulted for substance abuse counseling/education. CSW met with pt at bedside. Pt reportedly lives alone. Outpatient SUD resources added to AVS. TOC will continue to follow for discharge needs.    Expected Discharge Plan: Home/Self Care Barriers to Discharge: Continued Medical Work up   Patient Goals and CMS Choice Patient states their goals for this hospitalization and ongoing recovery are:: To return home        Expected Discharge Plan and Services In-house Referral: Clinical Social Work Discharge Planning Services: NA Post Acute Care Choice: NA Living arrangements for the past 2 months: Single Family Home                 DME Arranged: N/A DME Agency: NA       HH Arranged: NA HH Agency: NA        Prior Living Arrangements/Services Living arrangements for the past 2 months: Single Family Home Lives with:: Self Patient language and need for interpreter reviewed:: Yes Do you feel safe going back to the place where you live?: Yes      Need for Family Participation in Patient Care: No (Comment) Care giver support system in place?: No (comment)   Criminal Activity/Legal Involvement Pertinent to Current Situation/Hospitalization: No - Comment as needed  Activities of Daily Living   ADL Screening (condition at time of admission) Independently performs ADLs?: Yes (appropriate for developmental age) Is the patient deaf or have difficulty hearing?: No Does the patient have difficulty seeing, even when wearing glasses/contacts?: No Does the patient have difficulty concentrating, remembering, or making decisions?: Yes  Permission Sought/Granted   Permission granted to share information with :  No  Emotional Assessment Appearance:: Appears stated age, Well-Groomed Attitude/Demeanor/Rapport: Engaged Affect (typically observed): Pleasant, Stable, Appropriate Orientation: : Oriented to Self, Oriented to Place, Oriented to  Time, Oriented to Situation Alcohol / Substance Use: Alcohol Use Psych Involvement: No (comment)  Admission diagnosis:  Hyponatremia [E87.1] Acute urinary retention [R33.8] Patient Active Problem List   Diagnosis Date Noted   Hyponatremia 04/20/2024   Acute UTI (urinary tract infection) 04/20/2024   Acute urinary retention 04/20/2024   Abnormal CBC 05/16/2023   Vitamin B1 deficiency 05/16/2023   Vitamin B12 deficiency 05/16/2023   HLD (hyperlipidemia) 05/06/2023   Caregiver stress 11/09/2022   Elevated PSA 05/10/2022   Essential hypertension 04/17/2019   Schizoaffective disorder (HCC)    Medicare annual wellness visit, subsequent 03/14/2018   Nocturnal dyspnea 06/13/2017   Bipolar 1 disorder (HCC) 04/11/2017   Advanced care planning/counseling discussion 03/03/2015   Habitual alcohol use    Health maintenance examination 01/27/2014   Pes planus 01/11/2008   Osteopenia 09/04/2007   Ex-smoker 09/04/2007   Hematuria 07/18/2007   Benign prostatic hyperplasia 07/18/2007   Pain and swelling of left lower leg 07/18/2007   PCP:  Rilla Baller, MD Pharmacy:   [facility] [place], [place] - [address] [place] [address] Phone: [phone] Fax: [phone]     Social Drivers of Health (SDOH) Social History: SDOH Screenings   Food Insecurity: No Food Insecurity (04/20/2024)  Housing: Low Risk  (04/20/2024)  Transportation Needs: No Transportation Needs (04/20/2024)  Utilities: Not At Risk (04/20/2024)  Alcohol Screen: Medium Risk (05/09/2023)  Depression (PHQ2-9): Medium Risk (11/17/2023)  Financial Resource Strain: Low Risk  (05/09/2023)  Physical Activity: Inactive (05/09/2023)  Social Connections:  Moderately Integrated (04/20/2024)  Stress: No Stress Concern Present (05/09/2023)  Tobacco Use: Medium Risk (04/20/2024)  Health Literacy: Adequate Health Literacy (05/09/2023)   SDOH Interventions: None indicated     Readmission Risk Interventions     No data to display           Heather Saltness, MSW, LCSW Clinical Social Worker Inpatient Care Management 04/21/2024 11:12 AM

## 2024-04-21 NOTE — Care Management Obs Status (Signed)
 MEDICARE OBSERVATION STATUS NOTIFICATION   Patient Details  Name: Robert Fowler MRN: [id number] Date of Birth: [birth year]   Medicare Observation Status Notification Given: Yes    Reagan Klemz A Nicolle Heward, LCSW 04/21/2024, 10:15 AM

## 2024-04-21 NOTE — Progress Notes (Signed)
 PROGRESS NOTE  Robert Fowler FMW:[id number] DOB: [birth year] DOA: 04/20/2024 PCP: Rilla Baller, MD   LOS: 0 days   Brief narrative:   Robert Fowler is a 73 y.o. male with past medical history significant of osteoarthritis, BPH, bipolar disorder, alcohol use disorder, prediabetes, hyperlipidemia, schizoaffective disorder,  paranoid schizophrenia, essential hypertension , BPH presented to hospital with urinary retention and suprapubic tenderness.  In the ED labs were remarkable for hyponatremia with sodium of 123. CT abdomen/pelvis with contrast showed mild prostatomegaly with mild distention of the bladder.  The patient had a Foley catheter insertion and was given ceftriaxone  2 g IVPB x 1 and was admitted to the hospital for further evaluation and treatment.     Assessment/Plan: Principal Problem:   Hyponatremia Active Problems:   Benign prostatic hyperplasia   Schizoaffective disorder (HCC)   Essential hypertension   Acute UTI (urinary tract infection)   Acute urinary retention   Hyponatremia Likely secondary to excessive free water ingestion.  Continue with fluid restriction.  Check BMP in AM.  Sodium has improved to 131 from initial 123.  Patient was counseled about it and he understands.     Acute urinary retention secondary to   Benign prostatic hyperplasia Status post Foley catheter placement.  Holding hydroxyzine  and benztropine .  Urology recommends Cialis  for BPH LUTS.  Will start Cialis  as per recommendation.  Continue to hold hydroxyzine  and benzodiazepine.  Patient will likely need to continue Foley catheter and address this at outpatient    Acute UTI (urinary tract infection) History of enterococcal UTI from recent urine culture 04/19/2024. Urinalysis showed WBC at 21-50.    On vancomycin  at this time.  Urology has seen the patient today.  Will follow cultures     Schizoaffective disorder  On Depakote  fluoxetine  olanzapine      Essential hypertension Continue  amlodipine    DVT prophylaxis: enoxaparin  (LOVENOX ) injection 40 mg Start: 04/20/24 2200   Disposition: Home likely in 1 to 2 days  Status is: Observation The patient will require care spanning > 2 midnights and should be moved to inpatient because: IV vancomycin , follow cultures, status post urinary retention with Foley catheter, hypertension..    Code Status:     Code Status: Full Code  Family Communication: None  Consultants: Urology  Procedures: Foley catheter placement  Anti-infectives:  Vancomycin  IV Anti-infectives (From admission, onward)    Start     Dose/Rate Route Frequency Ordered Stop   04/20/24 1845  vancomycin  (VANCOREADY) IVPB 1250 mg/250 mL        1,250 mg 166.7 mL/hr over 90 Minutes Intravenous Every 12 hours 04/20/24 1757     04/20/24 1300  cefTRIAXone  (ROCEPHIN ) 2 g in sodium chloride  0.9 % 100 mL IVPB        2 g 200 mL/hr over 30 Minutes Intravenous  Once 04/20/24 1255 04/20/24 1354        Subjective: Today, patient was seen and examined at bedside.  Denies any pain, nausea vomiting fever chills.  Denies any shortness of breath dizziness lightheadedness.  Counseled about the limiting water intake.  Objective: Vitals:   04/21/24 0231 04/21/24 0955  BP: 130/89 (!) 146/87  Pulse: 70 90  Resp: 18   Temp: 98.2 F (36.8 C)   SpO2: 93% 98%    Intake/Output Summary (Last 24 hours) at 04/21/2024 1040 Last data filed at 04/21/2024 0800 Gross per 24 hour  Intake 2208.57 ml  Output 6875 ml  Net -4666.43 ml   [employer]  04/20/24 1032  Weight: 93 kg   Body mass index is 29.41 kg/m.   Physical Exam: GENERAL: Patient is alert awake and oriented. Not in obvious distress. HENT: No scleral pallor or icterus. Pupils equally reactive to light. Oral mucosa is moist NECK: is supple, no gross swelling noted. CHEST: Clear to auscultation. No crackles or wheezes.  CVS: S1 and S2 heard, no murmur. Regular rate and rhythm.  ABDOMEN: Soft,  non-tender, bowel sounds are present.  Foley catheter in place with clear urine. EXTREMITIES: No edema. CNS: Cranial nerves are intact. No focal motor deficits. SKIN: warm and dry without rashes.  Data Review: I have personally reviewed the following laboratory data and studies,  CBC: Recent Labs  Lab 04/20/24 1126 04/21/24 0354  WBC 7.5 5.2  NEUTROABS 6.0  --   HGB 13.7 12.7*  HCT 36.9* 36.2*  MCV 87.4 91.2  PLT 170 163   Basic Metabolic Panel: Recent Labs  Lab 04/20/24 1126 04/20/24 1846 04/21/24 0354  NA 123* 131* 131*  K 3.9 3.5 3.9  CL 87* 97* 101  CO2 20* 22 21*  GLUCOSE 115* 120* 92  BUN 6* 6* 6*  CREATININE 0.78 0.68 0.75  CALCIUM  9.9 9.3 8.7*  MG  --   --  2.1   Liver Function Tests: Recent Labs  Lab 04/20/24 1126 04/21/24 0354  AST 30 41  ALT 43 46*  ALKPHOS 131* 89  BILITOT 0.9 1.1  PROT 7.4 6.2*  ALBUMIN 4.6 3.2*   Recent Labs  Lab 04/20/24 1126  LIPASE 16   No results for input(s): AMMONIA in the last 168 hours. Cardiac Enzymes: No results for input(s): CKTOTAL, CKMB, CKMBINDEX, TROPONINI in the last 168 hours. BNP (last 3 results) No results for input(s): BNP in the last 8760 hours.  ProBNP (last 3 results) No results for input(s): PROBNP in the last 8760 hours.  CBG: No results for input(s): GLUCAP in the last 168 hours. Recent Results (from the past 240 hours)  Urine Culture     Status: Abnormal   Collection Time: 04/19/24  8:25 AM   Specimen: Urine, Clean Catch  Result Value Ref Range Status   Specimen Description URINE, CLEAN CATCH  Final   Special Requests   Final    NONE Performed at [facility], [address] [place], [place] [address]    Culture 60,000 COLONIES/mL ENTEROCOCCUS FAECALIS (A)  Final   Report Status 04/21/2024 FINAL  Final   Organism ID, Bacteria ENTEROCOCCUS FAECALIS (A)  Final      Susceptibility   Enterococcus faecalis - MIC*    AMPICILLIN <=2 SENSITIVE Sensitive      NITROFURANTOIN <=16 SENSITIVE Sensitive     VANCOMYCIN  2 SENSITIVE Sensitive     * 60,000 COLONIES/mL ENTEROCOCCUS FAECALIS     Studies: CT ABDOMEN PELVIS WO CONTRAST Result Date: 04/20/2024 CLINICAL DATA:  Acute nonlocalized abdominal pain. EXAM: CT ABDOMEN AND PELVIS WITHOUT CONTRAST TECHNIQUE: Multidetector CT imaging of the abdomen and pelvis was performed following the standard protocol without IV contrast. RADIATION DOSE REDUCTION: This exam was performed according to the departmental dose-optimization program which includes automated exposure control, adjustment of the mA and/or kV according to patient size and/or use of iterative reconstruction technique. COMPARISON:  05/09/2005 FINDINGS: Lower chest: No acute findings. Hepatobiliary: Scattered tiny hypodensities in the liver parenchyma are too small to characterize but are statistically most likely benign. No followup imaging is recommended. Calcified gallstones measure up to about 19 mm diameter. No  intrahepatic or extrahepatic biliary dilation. Pancreas: No focal mass lesion. No dilatation of the main duct. No intraparenchymal cyst. No peripancreatic edema. Spleen: No splenomegaly. No suspicious focal mass lesion. Adrenals/Urinary Tract: No adrenal nodule or mass. Tiny well-defined homogeneous low-density lesions in both kidneys are too small to characterize but are statistically most likely benign and probably cysts. No followup imaging is recommended. No evidence for hydroureter. Bladder is markedly distended. Stomach/Bowel: Stomach is distended with gas. Duodenum is normally positioned as is the ligament of Treitz. No small bowel wall thickening. No small bowel dilatation. The terminal ileum is normal. The appendix is normal. No gross colonic mass. No colonic wall thickening. Vascular/Lymphatic: There is moderate atherosclerotic calcification of the abdominal aorta without aneurysm. There is no gastrohepatic or hepatoduodenal ligament  lymphadenopathy. No retroperitoneal or mesenteric lymphadenopathy. No pelvic sidewall lymphadenopathy. Reproductive: Prostate gland is markedly enlarged. Other: No intraperitoneal free fluid. Musculoskeletal: No worrisome lytic or sclerotic osseous abnormality. IMPRESSION: 1. Marked prostatomegaly with marked distention of the bladder. Dysmotility or bladder outlet obstruction could cause this appearance. 2. Cholelithiasis. 3.  Aortic Atherosclerosis (ICD10-I70.0). Electronically Signed   By: Camellia Candle M.D.   On: 04/20/2024 12:08      Vernal Alstrom, MD  [facility] 04/21/2024  If 7PM-7AM, please contact night-coverage

## 2024-04-21 NOTE — Plan of Care (Signed)
 Pt ambulated twice around hallways with PT.  He is eating well and stating that he has no pain.

## 2024-04-21 NOTE — Progress Notes (Signed)
 Mobility Specialist - Progress Note   04/21/24 0951  Mobility  Activity Ambulated with assistance in hallway  Level of Assistance Modified independent, requires aide device or extra time  Assistive Device Front wheel walker  Distance Ambulated (ft) 500 ft  Range of Motion/Exercises Active  Activity Response Tolerated well  Mobility Referral Yes  Mobility visit 1 Mobility  Mobility Specialist Start Time (ACUTE ONLY) 0940  Mobility Specialist Stop Time (ACUTE ONLY) 0951  Mobility Specialist Time Calculation (min) (ACUTE ONLY) 11 min   Pt was found in bed and agreeable to ambulate. No complaints. At EOS returned to bed with all needs met. Call bell in reach.  Erminio Leos,  Mobility Specialist Can be reached via Secure Chat

## 2024-04-21 NOTE — Treatment Plan (Signed)
 Discussed with patient urinary retention and next steps including maintaining an indwelling foley catheter or beginning self intermittent catheterization.   Regardless of the option, would recommend bladder management until outpatient urology follow-up. Can begin 5mg  cialis  daily as well for BPH/LUTS

## 2024-04-21 NOTE — Hospital Course (Addendum)
 Robert Fowler is a 73 y.o. male with past medical history significant of osteoarthritis, BPH, bipolar disorder, alcohol use disorder, prediabetes, hyperlipidemia, schizoaffective disorder,  paranoid schizophrenia, essential hypertension , BPH presented to hospital with urinary retention and suprapubic tenderness.  In the ED labs were remarkable for hyponatremia with sodium of 123. CT abdomen/pelvis with contrast showed mild prostatomegaly with mild distention of the bladder.  The patient had a Foley catheter insertion and was given ceftriaxone  2 g IVPB x 1 and was admitted hospital for further evaluation and treatment.  Hyponatremia Likely secondary to excessive free water ingestion.  Continue with fluid restriction.  Check BMP in AM.  Sodium has improved to 131 from initial 123.     Acute urinary retention secondary to   Benign prostatic hyperplasia Status post Foley catheter placement.  Holding hydroxyzine  and benztropine .  Urology recommends Cialis  for BPH LUTS.    Acute UTI (urinary tract infection) History of enterococcal UTI from recent urine culture 04/19/2024. Urinalysis showed WBC at 21-50.  Follow urine cultures.  On vancomycin  at this time.  Urology to see.     Schizoaffective disorder  On Depakote  fluoxetine  olanzapine      Essential hypertension Continue amlodipine 

## 2024-04-22 ENCOUNTER — Ambulatory Visit (HOSPITAL_COMMUNITY): Payer: Self-pay

## 2024-04-22 DIAGNOSIS — E871 Hypo-osmolality and hyponatremia: Secondary | ICD-10-CM | POA: Diagnosis not present

## 2024-04-22 LAB — BASIC METABOLIC PANEL WITH GFR
Anion gap: 10 (ref 5–15)
BUN: 9 mg/dL (ref 8–23)
CO2: 20 mmol/L — ABNORMAL LOW (ref 22–32)
Calcium: 8.8 mg/dL — ABNORMAL LOW (ref 8.9–10.3)
Chloride: 103 mmol/L (ref 98–111)
Creatinine, Ser: 0.66 mg/dL (ref 0.61–1.24)
GFR, Estimated: >60 mL/min (ref >60)
Glucose, Bld: 96 mg/dL (ref 70–99)
Potassium: 3.8 mmol/L (ref 3.5–5.1)
Sodium: 133 mmol/L — ABNORMAL LOW (ref 135–145)

## 2024-04-22 LAB — CBC
HCT: 37.2 % — ABNORMAL LOW (ref 39.0–52.0)
Hemoglobin: 12.9 g/dL — ABNORMAL LOW (ref 13.0–17.0)
MCH: 32.3 pg (ref 26.0–34.0)
MCHC: 34.7 g/dL (ref 30.0–36.0)
MCV: 93 fL (ref 80.0–100.0)
Platelets: 177 K/uL (ref 150–400)
RBC: 4 MIL/uL — ABNORMAL LOW (ref 4.22–5.81)
RDW: 13.2 % (ref 11.5–15.5)
WBC: 4.8 K/uL (ref 4.0–10.5)
nRBC: 0 % (ref 0.0–0.2)

## 2024-04-22 LAB — MAGNESIUM: Magnesium: 2.1 mg/dL (ref 1.7–2.4)

## 2024-04-22 MED ORDER — TADALAFIL 5 MG PO TABS: 5.0000 mg | ORAL_TABLET | Freq: Every day | ORAL | 0 refills | Status: AC

## 2024-04-22 MED ORDER — MAGNESIUM HYDROXIDE 400 MG/5ML PO SUSP
30.0000 mL | Freq: Once | ORAL | Status: AC
  Administered 2024-04-22: 30 mL via ORAL
  Filled 2024-04-22: qty 30

## 2024-04-22 MED ORDER — AMOXICILLIN 500 MG PO CAPS: 500.0000 mg | ORAL_CAPSULE | Freq: Three times a day (TID) | ORAL | 0 refills | Status: AC

## 2024-04-22 MED ORDER — DOCUSATE SODIUM 100 MG PO CAPS: 100.0000 mg | ORAL_CAPSULE | Freq: Two times a day (BID) | ORAL | 0 refills | Status: AC

## 2024-04-22 NOTE — Discharge Summary (Signed)
 Physician Discharge Summary  Robert Fowler FMW:[id number] DOB: 1951-08-31 DOA: 04/20/2024  PCP: Rilla Baller, MD  Admit date: 04/20/2024 Discharge date: 04/22/2024  Admitted From: Home  Discharge disposition: Home   Recommendations for Outpatient Follow-Up:   Follow up with your primary care provider in one week.  Check CBC, BMP, magnesium  in the next visit Follow-up with [facility] in 1 to 2 weeks for addressing Foley catheter as outpatient   Discharge Diagnosis:   Principal Problem:   Hyponatremia Active Problems:   Benign prostatic hyperplasia   Schizoaffective disorder (HCC)   Essential hypertension   Acute UTI (urinary tract infection)   Acute urinary retention   Discharge Condition: Improved.  Diet recommendation: Low sodium, heart healthy.    Wound care: None.  Code status: Full.   History of Present Illness:   Robert Fowler is a 73 y.o. male with past medical history significant of osteoarthritis, BPH, bipolar disorder, alcohol use disorder, prediabetes, hyperlipidemia, schizoaffective disorder,  paranoid schizophrenia, essential hypertension , BPH presented to hospital with urinary retention and suprapubic tenderness.  In the ED labs were remarkable for hyponatremia with sodium of 123. CT abdomen/pelvis with contrast showed mild prostatomegaly with mild distention of the bladder.  The patient had a Foley catheter insertion and was given ceftriaxone  2 g IVPB x 1 and was admitted to the hospital for further evaluation and treatment.    Hospital Course:   Following conditions were addressed during hospitalization as listed below,  Hyponatremia Likely secondary to excessive free water ingestion.  Patient was advised against excessive drinking.  Sodium has improved to 133 from initial 123.  Patient was counseled about it and he understands.      Acute urinary retention secondary to   Benign prostatic hyperplasia Status post Foley  catheter placement.   Urology recommends Cialis  for BPH/ LUTS will be continued on discharge.  Urology to address Foley catheter as outpatient.     Acute UTI (urinary tract infection) History of enterococcal UTI from recent urine culture 04/19/2024. Initially patient was on vancomycin  but is sensitive to ampicillin and will change to amoxicillin  500 mg 3 times daily to complete the 7-day course.    Schizoaffective disorder  On Depakote  fluoxetine  olanzapine .  Continue on discharge     Essential hypertension Continue amlodipine    Disposition.  At this time, patient is stable for disposition home with outpatient PCP and urology follow-up  Medical Consultants:   Urology  Procedures:    Foley catheter placement Subjective:   Today, patient was seen and examined at bedside.  Patient denies any nausea vomiting fever chills or pain.  Wants to go home.  Discharge Exam:   Vitals:   04/21/24 1951 04/22/24 0437  BP: (!) 138/92 (!) 135/93  Pulse: 81 62  Resp: 17 17  Temp: 98.2 F (36.8 C) 98.7 F (37.1 C)  SpO2: 93% 96%   Vitals:   04/21/24 0955 04/21/24 1202 04/21/24 1951 04/22/24 0437  BP: (!) 146/87 (!) 154/87 (!) 138/92 (!) 135/93  Pulse: 90 75 81 62  Resp:  20 17 17   Temp:  98.3 F (36.8 C) 98.2 F (36.8 C) 98.7 F (37.1 C)  TempSrc:  Oral Oral Oral  SpO2: 98% 98% 93% 96%  Weight:      Height:        General: Alert awake, not in obvious distress, HENT: pupils equally reacting to light,  No scleral pallor or icterus noted. Oral mucosa is moist.  Chest:  Clear breath sounds. . No crackles or wheezes.  CVS: S1 &S2 heard. No murmur.  Regular rate and rhythm. Abdomen: Soft, nontender, nondistended.  Bowel sounds are heard.  Foley catheter in place Extremities: No cyanosis, clubbing or edema.  Peripheral pulses are palpable. Psych: Alert, awake and oriented, normal mood CNS:  No cranial nerve deficits.  Power equal in all extremities.   Skin: Warm and dry.  No rashes  noted.  The results of significant diagnostics from this hospitalization (including imaging, microbiology, ancillary and laboratory) are listed below for reference.     Diagnostic Studies:   CT ABDOMEN PELVIS WO CONTRAST Result Date: 04/20/2024 CLINICAL DATA:  Acute nonlocalized abdominal pain. EXAM: CT ABDOMEN AND PELVIS WITHOUT CONTRAST TECHNIQUE: Multidetector CT imaging of the abdomen and pelvis was performed following the standard protocol without IV contrast. RADIATION DOSE REDUCTION: This exam was performed according to the departmental dose-optimization program which includes automated exposure control, adjustment of the mA and/or kV according to patient size and/or use of iterative reconstruction technique. COMPARISON:  05/09/2005 FINDINGS: Lower chest: No acute findings. Hepatobiliary: Scattered tiny hypodensities in the liver parenchyma are too small to characterize but are statistically most likely benign. No followup imaging is recommended. Calcified gallstones measure up to about 19 mm diameter. No intrahepatic or extrahepatic biliary dilation. Pancreas: No focal mass lesion. No dilatation of the main duct. No intraparenchymal cyst. No peripancreatic edema. Spleen: No splenomegaly. No suspicious focal mass lesion. Adrenals/Urinary Tract: No adrenal nodule or mass. Tiny well-defined homogeneous low-density lesions in both kidneys are too small to characterize but are statistically most likely benign and probably cysts. No followup imaging is recommended. No evidence for hydroureter. Bladder is markedly distended. Stomach/Bowel: Stomach is distended with gas. Duodenum is normally positioned as is the ligament of Treitz. No small bowel wall thickening. No small bowel dilatation. The terminal ileum is normal. The appendix is normal. No gross colonic mass. No colonic wall thickening. Vascular/Lymphatic: There is moderate atherosclerotic calcification of the abdominal aorta without aneurysm. There is  no gastrohepatic or hepatoduodenal ligament lymphadenopathy. No retroperitoneal or mesenteric lymphadenopathy. No pelvic sidewall lymphadenopathy. Reproductive: Prostate gland is markedly enlarged. Other: No intraperitoneal free fluid. Musculoskeletal: No worrisome lytic or sclerotic osseous abnormality. IMPRESSION: 1. Marked prostatomegaly with marked distention of the bladder. Dysmotility or bladder outlet obstruction could cause this appearance. 2. Cholelithiasis. 3.  Aortic Atherosclerosis (ICD10-I70.0). Electronically Signed   By: Robert Fowler M.D.   On: 04/20/2024 12:08     Labs:   Basic Metabolic Panel: Recent Labs  Lab 04/20/24 1126 04/20/24 1846 04/21/24 0354 04/22/24 0409  NA 123* 131* 131* 133*  K 3.9 3.5 3.9 3.8  CL 87* 97* 101 103  CO2 20* 22 21* 20*  GLUCOSE 115* 120* 92 96  BUN 6* 6* 6* 9  CREATININE 0.78 0.68 0.75 0.66  CALCIUM  9.9 9.3 8.7* 8.8*  MG  --   --  2.1 2.1   GFR Estimated Creatinine Clearance: 95.6 mL/min (by C-G formula based on SCr of 0.66 mg/dL). Liver Function Tests: Recent Labs  Lab 04/20/24 1126 04/21/24 0354  AST 30 41  ALT 43 46*  ALKPHOS 131* 89  BILITOT 0.9 1.1  PROT 7.4 6.2*  ALBUMIN 4.6 3.2*   Recent Labs  Lab 04/20/24 1126  LIPASE 16   No results for input(s): AMMONIA in the last 168 hours. Coagulation profile No results for input(s): INR, PROTIME in the last 168 hours.  CBC: Recent Labs  Lab 04/20/24 1126 04/21/24  0354 04/22/24 0409  WBC 7.5 5.2 4.8  NEUTROABS 6.0  --   --   HGB 13.7 12.7* 12.9*  HCT 36.9* 36.2* 37.2*  MCV 87.4 91.2 93.0  PLT 170 163 177   Cardiac Enzymes: No results for input(s): CKTOTAL, CKMB, CKMBINDEX, TROPONINI in the last 168 hours. BNP: Invalid input(s): POCBNP CBG: No results for input(s): GLUCAP in the last 168 hours. D-Dimer No results for input(s): DDIMER in the last 72 hours. Hgb A1c No results for input(s): HGBA1C in the last 72 hours. Lipid Profile No  results for input(s): CHOL, HDL, LDLCALC, TRIG, CHOLHDL, LDLDIRECT in the last 72 hours. Thyroid  function studies No results for input(s): TSH, T4TOTAL, T3FREE, THYROIDAB in the last 72 hours.  Invalid input(s): FREET3 Anemia work up No results for input(s): VITAMINB12, FOLATE, FERRITIN, TIBC, IRON, RETICCTPCT in the last 72 hours. Microbiology Recent Results (from the past 240 hours)  Urine Culture     Status: Abnormal   Collection Time: 04/19/24  8:25 AM   Specimen: Urine, Clean Catch  Result Value Ref Range Status   Specimen Description URINE, CLEAN CATCH  Final   Special Requests   Final    NONE Performed at [facility], [address] [place], [place] [address]    Culture 60,000 COLONIES/mL ENTEROCOCCUS FAECALIS (A)  Final   Report Status 04/21/2024 FINAL  Final   Organism ID, Bacteria ENTEROCOCCUS FAECALIS (A)  Final      Susceptibility   Enterococcus faecalis - MIC*    AMPICILLIN <=2 SENSITIVE Sensitive     NITROFURANTOIN <=16 SENSITIVE Sensitive     VANCOMYCIN  2 SENSITIVE Sensitive     * 60,000 COLONIES/mL ENTEROCOCCUS FAECALIS     Discharge Instructions:   Discharge Instructions     Call MD for:  severe uncontrolled pain   Complete by: As directed    Call MD for:  temperature >100.4   Complete by: As directed    Diet general   Complete by: As directed    Discharge instructions   Complete by: As directed    Continue Foley catheter until seen by urology.  Complete course of antibiotic.  Take medications as prescribed. Follow-up with Dr. Carolynn urology in 1-2 weeks to  discuss about catheter management.  Seek medical attention for worsening symptoms.   Increase activity slowly   Complete by: As directed       Allergies as of 04/22/2024       Reactions   Poison Oak Extract Rash, Other (See Comments)   Broke out severely        Medication List     STOP taking these medications    sulfamethoxazole -trimethoprim   800-160 MG tablet Commonly known as: BACTRIM  DS       TAKE these medications    acetaminophen  500 MG tablet Commonly known as: TYLENOL  Take 1,000 mg by mouth at bedtime.   amLODipine  2.5 MG tablet Commonly known as: NORVASC  Take 1 tablet (2.5 mg total) by mouth daily.   amoxicillin  500 MG capsule Commonly known as: AMOXIL  Take 1 capsule (500 mg total) by mouth 3 (three) times daily for 7 days.   aspirin  81 MG tablet Take 81 mg by mouth daily.   b complex vitamins capsule Take 1 capsule by mouth daily. What changed: when to take this   benztropine  0.5 MG tablet Commonly known as: COGENTIN  Take 0.5 mg by mouth at bedtime.   Calcium  Citrate 333 MG Tabs Take 1 tablet by mouth daily. What  changed: how much to take   divalproex  250 MG 24 hr tablet Commonly known as: DEPAKOTE  ER Take 750 mg by mouth at bedtime.   docusate sodium  100 MG capsule Commonly known as: Colace Take 1 capsule (100 mg total) by mouth 2 (two) times daily for 14 days.   FLUoxetine  20 MG capsule Commonly known as: PROZAC  Take 20 mg by mouth in the morning.   hydrOXYzine  25 MG tablet Commonly known as: ATARAX  Take 25 mg by mouth 2 (two) times daily as needed for anxiety.   OLANZapine  zydis 5 MG disintegrating tablet Commonly known as: ZYPREXA  Take 5 mg by mouth See admin instructions. Dissolve 5 mg in the mouth in the morning   OLANZapine  zydis 10 MG disintegrating tablet Commonly known as: ZYPREXA  Take 10 mg by mouth See admin instructions. Dissolve 10 mg in the mouth at bedtime   tadalafil  5 MG tablet Commonly known as: CIALIS  Take 1 tablet (5 mg total) by mouth daily.   tamsulosin  0.4 MG Caps capsule Commonly known as: FLOMAX  Take 1 capsule (0.4 mg total) by mouth daily.   VITAMIN D3 PO Take 1 capsule by mouth in the morning.        Follow-up Information     Devere Lonni Righter, MD. Call.   Specialty: Urology Contact information: [address] [place]  [place] [address] [phone]         Rilla Baller, MD Follow up in 1 week(s).   Specialty: Family Medicine Contact information: [address] [place] [address] [phone]                  Time coordinating discharge: 39 minutes  Signed:  Oyinkansola Truax  [facility] 04/22/2024, 3:18 PM

## 2024-04-23 ENCOUNTER — Telehealth: Payer: Self-pay

## 2024-04-23 NOTE — Transitions of Care (Post Inpatient/ED Visit) (Signed)
 04/23/2024  Name: Robert Fowler MRN: [id number] DOB: 1950/11/12  Today's TOC FU Call Status: Today's TOC FU Call Status:: Successful TOC FU Call Completed TOC FU Call Complete Date: 04/23/24 Patient's Name and Date of Birth confirmed.  Transition Care Management Follow-up Telephone Call Date of Discharge: 04/22/24 Discharge Facility: [facility]) Type of Discharge: Inpatient Admission Primary Inpatient Discharge Diagnosis:: urine retention How have you been since you were released from the hospital?: Better Any questions or concerns?: No  Items Reviewed: Did you receive and understand the discharge instructions provided?: Yes Medications obtained,verified, and reconciled?: Yes (Medications Reviewed) Any new allergies since your discharge?: No Dietary orders reviewed?: Yes Do you have support at home?: Yes People in Home [RPT]: sibling(s)  Medications Reviewed Today: Medications Reviewed Today     Reviewed by Emmitt Pan, LPN (Licensed Practical Nurse) on 04/23/24 at 1120  Med List Status: <None>   Medication Order Taking? Sig Documenting Provider Last Dose Status Informant  acetaminophen  (TYLENOL ) 500 MG tablet [id number] Yes Take 1,000 mg by mouth at bedtime. [provider]  Active Self  amLODipine  (NORVASC ) 2.5 MG tablet [id number] Yes Take 1 tablet (2.5 mg total) by mouth daily. Rilla Baller, MD  Active Self  amoxicillin  (AMOXIL ) 500 MG capsule [id number] Yes Take 1 capsule (500 mg total) by mouth 3 (three) times daily for 7 days. Pokhrel, Laxman, MD  Active   aspirin  81 MG tablet [id number] Yes Take 81 mg by mouth daily. [provider]  Active Self  b complex vitamins capsule [id number] Yes Take 1 capsule by mouth daily.  Patient taking differently: Take 1 capsule by mouth every other day.   Rilla Baller, MD  Active Self  benztropine  (COGENTIN ) 0.5 MG tablet [id number] Yes Take 0.5 mg by mouth at bedtime. [provider]  Active  Self  Calcium  Citrate 333 MG TABS [id number] Yes Take 1 tablet by mouth daily.  Patient taking differently: Take 333 mg by mouth daily.   Rilla Baller, MD  Active Self  Cholecalciferol  (VITAMIN D3 PO) [id number] Yes Take 1 capsule by mouth in the morning. [provider]  Active Self           Med Note MARISA, KATHY N   Sat Apr 20, 2024  5:02 PM) Strength not recalled  divalproex  (DEPAKOTE  ER) 250 MG 24 hr tablet [id number] Yes Take 750 mg by mouth at bedtime. [provider]  Active Self  docusate sodium  (COLACE) 100 MG capsule [id number] Yes Take 1 capsule (100 mg total) by mouth 2 (two) times daily for 14 days. Pokhrel, Laxman, MD  Active   FLUoxetine  (PROZAC ) 20 MG capsule [id number] Yes Take 20 mg by mouth in the morning. [provider]  Active Self  hydrOXYzine  (ATARAX /VISTARIL ) 25 MG tablet [id number] Yes Take 25 mg by mouth 2 (two) times daily as needed for anxiety. Rilla Baller, MD  Active Self  OLANZapine  zydis (ZYPREXA ) 10 MG disintegrating tablet [id number] Yes Take 10 mg by mouth See admin instructions. Dissolve 10 mg in the mouth at bedtime [provider]  Active Self  OLANZapine  zydis (ZYPREXA ) 5 MG disintegrating tablet [id number] Yes Take 5 mg by mouth See admin instructions. Dissolve 5 mg in the mouth in the morning [provider]  Active Self  tadalafil  (CIALIS ) 5 MG tablet [id number] Yes Take 1 tablet (5 mg total) by mouth daily. Pokhrel, Laxman, MD  Active   tamsulosin  (FLOMAX ) 0.4 MG CAPS capsule [id number] Yes Take 1 capsule (0.4 mg total)  by mouth daily. Rilla Baller, MD  Active Self            Home Care and Equipment/Supplies: Were Home Health Services Ordered?: NA Any new equipment or medical supplies ordered?: NA  Functional Questionnaire: Do you need assistance with bathing/showering or dressing?: No Do you need assistance with meal preparation?: No Do you need assistance with eating?: No Do you have  difficulty maintaining continence: No Do you need assistance with getting out of bed/getting out of a chair/moving?: No Do you have difficulty managing or taking your medications?: No  Follow up appointments reviewed: PCP Follow-up appointment confirmed?: Yes Date of PCP follow-up appointment?: 04/30/24 Follow-up Provider: [facility] Follow-up appointment confirmed?: Yes Date of Specialist follow-up appointment?: 05/01/24 Follow-Up Specialty Provider:: uro Do you need transportation to your follow-up appointment?: No Do you understand care options if your condition(s) worsen?: Yes-patient verbalized understanding    SIGNATURE Julian Lemmings, LPN [facility] Nurse Health Advisor Direct Dial [phone]

## 2024-04-27 ENCOUNTER — Emergency Department (HOSPITAL_COMMUNITY)
Admission: EM | Admit: 2024-04-27 | Discharge: 2024-04-27 | Disposition: A | Discharge status: Home / Self Care | Attending: Emergency Medicine | Admitting: Emergency Medicine

## 2024-04-27 DIAGNOSIS — Z7982 Long term (current) use of aspirin: Secondary | ICD-10-CM | POA: Insufficient documentation

## 2024-04-27 DIAGNOSIS — N4889 Other specified disorders of penis: Secondary | ICD-10-CM | POA: Diagnosis not present

## 2024-04-27 DIAGNOSIS — R319 Hematuria, unspecified: Secondary | ICD-10-CM | POA: Diagnosis not present

## 2024-04-27 LAB — URINALYSIS, W/ REFLEX TO CULTURE (INFECTION SUSPECTED)
Bacteria, UA: NONE SEEN
Bilirubin Urine: NEGATIVE
Glucose, UA: NEGATIVE mg/dL
Ketones, ur: 5 mg/dL — AB
Leukocytes,Ua: NEGATIVE
Nitrite: NEGATIVE
Protein, ur: NEGATIVE mg/dL
Specific Gravity, Urine: 1.013 (ref 1.005–1.030)
pH: 6 (ref 5.0–8.0)

## 2024-04-27 MED ORDER — BACITRACIN ZINC 500 UNIT/GM EX OINT
TOPICAL_OINTMENT | Freq: Two times a day (BID) | CUTANEOUS | Status: DC
  Filled 2024-04-27 (×2): qty 0.9

## 2024-04-27 NOTE — ED Triage Notes (Signed)
 Patient complains of pain in penis at cath insertion. States it is red, some bloody drainage, and tender to touch. No issues with drainage. Cath is draining normally.

## 2024-04-27 NOTE — ED Notes (Signed)
 Replaced leg bag. Waiting for fresh sample for collection.

## 2024-04-27 NOTE — ED Provider Notes (Signed)
 [place] EMERGENC[facility] DEPARTMENT AT [facility] Provider Note   CSN: [id number] Arrival date & time: 04/27/24  1402     Patient presents with: No chief complaint on file.   Robert Fowler is a 73 y.o. male.   Patient to ED with concern for drainage and paid from his indwelling foley catheter. He reports urine is draining into the collection bag without difficulty. It appears clear. He does not feel his bladder is full. No abdominal pain, fever. He saw a single episode of hematuria yesterday and none since. The catheter was placed one week ago for acute urinary retention. His follow up appointment with urology is in 5 days.   The history is provided by the patient. No language interpreter was used.       Prior to Admission medications   Medication Sig Start Date End Date Taking? Authorizing Provider  acetaminophen  (TYLENOL ) 500 MG tablet Take 1,000 mg by mouth at bedtime.    [provider]  amLODipine  (NORVASC ) 2.5 MG tablet Take 1 tablet (2.5 mg total) by mouth daily. 05/16/23   Rilla Baller, MD  amoxicillin  (AMOXIL ) 500 MG capsule Take 1 capsule (500 mg total) by mouth 3 (three) times daily for 7 days. 04/22/24 04/29/24  Pokhrel, Laxman, MD  aspirin  81 MG tablet Take 81 mg by mouth daily.    [provider]  b complex vitamins capsule Take 1 capsule by mouth daily. Patient taking differently: Take 1 capsule by mouth every other day. 05/16/23   Rilla Baller, MD  benztropine  (COGENTIN ) 0.5 MG tablet Take 0.5 mg by mouth at bedtime.    [provider]  Calcium  Citrate 333 MG TABS Take 1 tablet by mouth daily. Patient taking differently: Take 333 mg by mouth daily. 11/09/22   Rilla Baller, MD  Cholecalciferol  (VITAMIN D3 PO) Take 1 capsule by mouth in the morning.    [provider]  divalproex  (DEPAKOTE  ER) 250 MG 24 hr tablet Take 750 mg by mouth at bedtime. 02/23/18   [provider]  docusate sodium  (COLACE) 100 MG  capsule Take 1 capsule (100 mg total) by mouth 2 (two) times daily for 14 days. 04/22/24 05/06/24  Pokhrel, Laxman, MD  FLUoxetine  (PROZAC ) 20 MG capsule Take 20 mg by mouth in the morning. 02/23/18   [provider]  hydrOXYzine  (ATARAX /VISTARIL ) 25 MG tablet Take 25 mg by mouth 2 (two) times daily as needed for anxiety. 05/07/21   Rilla Baller, MD  OLANZapine  zydis (ZYPREXA ) 10 MG disintegrating tablet Take 10 mg by mouth See admin instructions. Dissolve 10 mg in the mouth at bedtime    [provider]  OLANZapine  zydis (ZYPREXA ) 5 MG disintegrating tablet Take 5 mg by mouth See admin instructions. Dissolve 5 mg in the mouth in the morning    [provider]  tadalafil  (CIALIS ) 5 MG tablet Take 1 tablet (5 mg total) by mouth daily. 04/22/24   Pokhrel, Laxman, MD  tamsulosin  (FLOMAX ) 0.4 MG CAPS capsule Take 1 capsule (0.4 mg total) by mouth daily. 05/16/23   Rilla Baller, MD    Allergies: Poison oak extract    Review of Systems  Updated Vital Signs BP (!) 144/88 (BP Location: Right Arm)   Pulse 80   Temp 98.2 F (36.8 C) (Oral)   Resp 18   SpO2 95%   Physical Exam Vitals and nursing note reviewed.  Constitutional:      General: He is not in acute distress.    Appearance:  Normal appearance.  Cardiovascular:     Rate and Rhythm: Normal rate.  Pulmonary:     Effort: Pulmonary effort is normal.  Abdominal:     General: There is no distension.     Palpations: Abdomen is soft.     Tenderness: There is no abdominal tenderness.  Genitourinary:    Comments: Foley catheter in place and stable. There is clear urine in the collection bag and tubing is also clear. There is minimal discharge at the penile meatus without redness, swelling, irritation or blood.  Neurological:     Mental Status: He is alert.     (all labs ordered are listed, but only abnormal results are displayed) Labs Reviewed  URINALYSIS, W/ REFLEX TO CULTURE (INFECTION SUSPECTED) -  Abnormal; Notable for the following components:      Result Value   Hgb urine dipstick SMALL (*)    Ketones, ur 5 (*)    All other components within normal limits    EKG: None  Radiology: No results found.   Procedures   Medications Ordered in the ED  bacitracin  ointment (has no administration in time range)    Clinical Course as of 04/27/24 1704  Sat Apr 27, 2024  1656 Patient to eD with concern for discomfort at the penile meatus where he sees pus around the foley catheter tubing. There is no redness or irritation there. There is minimal yellowish drainage. UA negative - culture pending. No retention issues. Will clean the area well, topical abx. He is due to see his urologist in 5 days, and PCP in 4 days. This area can be rechecked at that time. Discussed return precautions - fever, redness, bleeding, decreased urine drainage.  [SU]    Clinical Course User Index [SU] Odell Balls, PA-C                                 Medical Decision Making       Final diagnoses:  Penile irritation    ED Discharge Orders     None          Odell Balls, PA-C 04/27/24 1704    Franklyn Sid SAILOR, MD 04/27/24 [phone]

## 2024-04-27 NOTE — Discharge Instructions (Signed)
 Keep your upcoming appointments with your primary care provider and the urologist for recheck of catheter and urinary retention. Clean the tip of the penis around the catheter tubing daily with soapy water and apply topical antibiotics like Neosporin.   Return to the ED with any new or worsening symptoms at any time.

## 2024-04-30 ENCOUNTER — Ambulatory Visit (INDEPENDENT_AMBULATORY_CARE_PROVIDER_SITE_OTHER): Admitting: Family Medicine

## 2024-04-30 ENCOUNTER — Encounter: Payer: Self-pay | Admitting: Family Medicine

## 2024-04-30 VITALS — BP 130/84 | HR 70 | Temp 98.2°F | Ht 70.0 in | Wt 192.1 lb

## 2024-04-30 DIAGNOSIS — N401 Enlarged prostate with lower urinary tract symptoms: Secondary | ICD-10-CM | POA: Diagnosis not present

## 2024-04-30 DIAGNOSIS — E519 Thiamine deficiency, unspecified: Secondary | ICD-10-CM | POA: Diagnosis not present

## 2024-04-30 DIAGNOSIS — R338 Other retention of urine: Secondary | ICD-10-CM

## 2024-04-30 DIAGNOSIS — R351 Nocturia: Secondary | ICD-10-CM

## 2024-04-30 DIAGNOSIS — E7800 Pure hypercholesterolemia, unspecified: Secondary | ICD-10-CM

## 2024-04-30 DIAGNOSIS — E538 Deficiency of other specified B group vitamins: Secondary | ICD-10-CM | POA: Diagnosis not present

## 2024-04-30 DIAGNOSIS — F319 Bipolar disorder, unspecified: Secondary | ICD-10-CM | POA: Diagnosis not present

## 2024-04-30 DIAGNOSIS — F1090 Alcohol use, unspecified, uncomplicated: Secondary | ICD-10-CM

## 2024-04-30 DIAGNOSIS — M8588 Other specified disorders of bone density and structure, other site: Secondary | ICD-10-CM | POA: Diagnosis not present

## 2024-04-30 DIAGNOSIS — E871 Hypo-osmolality and hyponatremia: Secondary | ICD-10-CM | POA: Diagnosis not present

## 2024-04-30 DIAGNOSIS — N390 Urinary tract infection, site not specified: Secondary | ICD-10-CM

## 2024-04-30 LAB — CBC WITH DIFFERENTIAL/PLATELET
Basophils Absolute: 0.1 K/uL (ref 0.0–0.1)
Basophils Relative: 1.2 % (ref 0.0–3.0)
Eosinophils Absolute: 0.1 K/uL (ref 0.0–0.7)
Eosinophils Relative: 2.2 % (ref 0.0–5.0)
HCT: 40.3 % (ref 39.0–52.0)
Hemoglobin: 13.8 g/dL (ref 13.0–17.0)
Lymphocytes Relative: 19.8 % (ref 12.0–46.0)
Lymphs Abs: 0.9 K/uL (ref 0.7–4.0)
MCHC: 34.3 g/dL (ref 30.0–36.0)
MCV: 92.3 fl (ref 78.0–100.0)
Monocytes Absolute: 0.4 K/uL (ref 0.1–1.0)
Monocytes Relative: 7.8 % (ref 3.0–12.0)
Neutro Abs: 3.3 K/uL (ref 1.4–7.7)
Neutrophils Relative %: 69 % (ref 43.0–77.0)
Platelets: 273 K/uL (ref 150.0–400.0)
RBC: 4.37 Mil/uL (ref 4.22–5.81)
RDW: 13.5 % (ref 11.5–15.5)
WBC: 4.8 K/uL (ref 4.0–10.5)

## 2024-04-30 LAB — COMPREHENSIVE METABOLIC PANEL WITH GFR
ALT: 21 U/L (ref 0–53)
AST: 12 U/L (ref 0–37)
Albumin: 4.4 g/dL (ref 3.5–5.2)
Alkaline Phosphatase: 71 U/L (ref 39–117)
BUN: 6 mg/dL (ref 6–23)
CO2: 29 meq/L (ref 19–32)
Calcium: 9.9 mg/dL (ref 8.4–10.5)
Chloride: 98 meq/L (ref 96–112)
Creatinine, Ser: 0.65 mg/dL (ref 0.40–1.50)
GFR: 94.37 mL/min (ref >60.00)
Glucose, Bld: 87 mg/dL (ref 70–99)
Potassium: 4.2 meq/L (ref 3.5–5.1)
Sodium: 135 meq/L (ref 135–145)
Total Bilirubin: 0.6 mg/dL (ref 0.2–1.2)
Total Protein: 6.7 g/dL (ref 6.0–8.3)

## 2024-04-30 LAB — LIPID PANEL
Cholesterol: 160 mg/dL (ref 0–200)
HDL: 56 mg/dL (ref >39.00)
LDL Cholesterol: 81 mg/dL (ref 0–99)
NonHDL: 104.47
Total CHOL/HDL Ratio: 3
Triglycerides: 118 mg/dL (ref 0.0–149.0)
VLDL: 23.6 mg/dL (ref 0.0–40.0)

## 2024-04-30 LAB — VITAMIN D 25 HYDROXY (VIT D DEFICIENCY, FRACTURES): VITD: 70.02 ng/mL (ref 30.00–100.00)

## 2024-04-30 LAB — PSA: PSA: 11.79 ng/mL — ABNORMAL HIGH (ref 0.10–4.00)

## 2024-04-30 LAB — VITAMIN B12: Vitamin B-12: 653 pg/mL (ref 211–911)

## 2024-04-30 LAB — MAGNESIUM: Magnesium: 2 mg/dL (ref 1.5–2.5)

## 2024-04-30 LAB — TSH: TSH: 0.83 u[IU]/mL (ref 0.35–5.50)

## 2024-04-30 NOTE — Progress Notes (Signed)
 Ph: [phone] Fax: [phone]   Patient ID: Robert Fowler, male    DOB: [birth year], 73 y.o.   MRN: [id number]  This visit was conducted in person.  BP 130/84   Pulse 70   Temp 98.2 F (36.8 C) (Oral)   Ht 5' 10 (1.778 m)   Wt 192 lb 2 oz (87.1 kg)   SpO2 98%   BMI 27.57 kg/m    CC: hosp f/u visit  Subjective:   HPI: Robert Fowler is a 73 y.o. male presenting on 04/30/2024 for Hospitalization Follow-up (Pt here for hosp f/u. Pt admitted to [facility] on 7/26 and dc on 7/28 Urine retention. Pt has scab on penis from catheter.)   Mother passed away 03/27/24.   Recent hospitalization for hyponatremia to 123 associated with UTI and acute urinary retention. CT abd/pelvis showed marked prostatomegaly with bladder distention. Foley catheter inserted.  Hospital records reviewed. Med rec performed.  Discharged on amoxicillin  500mg  TID 7d course.  Hyponatremia thought due to excessive water intake. Improved to 133 on discharge.  Incidentally noted calcified gallstones up to 1.9cm.  Plan to follow up with urology for foley catheter management.   Known BPH on flomax  daily. He was stared on cialis  5mg  daily x10 days. S/p benign prostate biopsy 11/2023   Initially seen at [facility] 04/19/2024 with UTI treated with rocephin  1gm followed by bactrim  DS 7d course. UCx grew 60k enterococcus faecalis.  Seen again at ER on 04/27/2024 with penile irritation due to foley catheter. Treated with topical antibiotic cream.  Since home, urine draining into foley bag well, no further abdominal pain.   He's cut down on wine to <2 glasses/day  Home health not needed.  Other follow up appointments scheduled: [facility] tomorrow 9:30am ______________________________________________________________________ Hospital admission: 04/20/2024 Hospital discharge: 04/22/2024 TCM f/u phone call:  performed on 04/23/2024  Outpatient follow up recommendations: Follow up with your primary care provider in one  week.  Check CBC, BMP, magnesium  in the next visit Follow-up with [facility] in 1 to 2 weeks for addressing Foley catheter as outpatient  Discharge diagnoses: Principal Problem:   Hyponatremia Active Problems:   Benign prostatic hyperplasia   Schizoaffective disorder (HCC)   Essential hypertension   Acute UTI (urinary tract infection)   Acute urinary retention     Relevant past medical, surgical, family and social history reviewed and updated as indicated. Interim medical history since our last visit reviewed. Allergies and medications reviewed and updated. Outpatient Medications Prior to Visit  Medication Sig Dispense Refill   acetaminophen  (TYLENOL ) 500 MG tablet Take 1,000 mg by mouth at bedtime.     amLODipine  (NORVASC ) 2.5 MG tablet Take 1 tablet (2.5 mg total) by mouth daily. 90 tablet 4   amoxicillin  (AMOXIL ) 500 MG capsule Take 1 capsule (500 mg total) by mouth 3 (three) times daily for 7 days. 21 capsule 0   aspirin  81 MG tablet Take 81 mg by mouth daily.     b complex vitamins capsule Take 1 capsule by mouth daily. (Patient taking differently: Take 1 capsule by mouth every other day.)     benztropine  (COGENTIN ) 0.5 MG tablet Take 0.5 mg by mouth at bedtime.     Calcium  Citrate 333 MG TABS Take 1 tablet by mouth daily. (Patient taking differently: Take 333 mg by mouth daily.)     Cholecalciferol  (VITAMIN D3 PO) Take 1 capsule by mouth in the morning.     divalproex  (DEPAKOTE  ER) 250 MG 24 hr  tablet Take 750 mg by mouth at bedtime.     docusate sodium  (COLACE) 100 MG capsule Take 1 capsule (100 mg total) by mouth 2 (two) times daily for 14 days. 10 capsule 0   FLUoxetine  (PROZAC ) 20 MG capsule Take 20 mg by mouth in the morning.     hydrOXYzine  (ATARAX /VISTARIL ) 25 MG tablet Take 25 mg by mouth 2 (two) times daily as needed for anxiety.     OLANZapine  zydis (ZYPREXA ) 10 MG disintegrating tablet Take 10 mg by mouth See admin instructions. Dissolve 10 mg in the  mouth at bedtime     OLANZapine  zydis (ZYPREXA ) 5 MG disintegrating tablet Take 5 mg by mouth See admin instructions. Dissolve 5 mg in the mouth in the morning     tadalafil  (CIALIS ) 5 MG tablet Take 1 tablet (5 mg total) by mouth daily. 10 tablet 0   tamsulosin  (FLOMAX ) 0.4 MG CAPS capsule Take 1 capsule (0.4 mg total) by mouth daily. 90 capsule 4   No facility-administered medications prior to visit.     Per HPI unless specifically indicated in ROS section below Review of Systems  Objective:  BP 130/84   Pulse 70   Temp 98.2 F (36.8 C) (Oral)   Ht 5' 10 (1.778 m)   Wt 192 lb 2 oz (87.1 kg)   SpO2 98%   BMI 27.57 kg/m   Wt Readings from Last 3 Encounters:  04/30/24 192 lb 2 oz (87.1 kg)  04/20/24 205 lb (93 kg)  11/17/23 206 lb (93.4 kg)      Physical Exam Vitals and nursing note reviewed.  Constitutional:      Appearance: Normal appearance. He is not ill-appearing.  HENT:     Head: Normocephalic and atraumatic.     Mouth/Throat:     Mouth: Mucous membranes are moist.     Pharynx: Oropharynx is clear. No oropharyngeal exudate or posterior oropharyngeal erythema.  Eyes:     Extraocular Movements: Extraocular movements intact.     Pupils: Pupils are equal, round, and reactive to light.  Abdominal:     General: Bowel sounds are normal. There is no distension.     Palpations: Abdomen is soft. There is no mass.     Tenderness: There is no abdominal tenderness. There is no guarding or rebound.     Hernia: No hernia is present.  Genitourinary:    Penis: Normal.      Comments: Foley catheter in place with bag attached to R thigh, clear yellow urine present in bag Musculoskeletal:     Right lower leg: No edema.     Left lower leg: No edema.  Skin:    General: Skin is warm and dry.     Findings: No rash.  Neurological:     Mental Status: He is alert.  Psychiatric:        Mood and Affect: Mood normal.        Behavior: Behavior normal.       Results for orders  placed or performed during the hospital encounter of 04/27/24  Urinalysis, w/ Reflex to Culture (Infection Suspected) -Urine, Catheterized; Indwelling urinary catheter   Collection Time: 04/27/24  3:18 PM  Result Value Ref Range   Specimen Source URINE, CATHETERIZED    Color, Urine YELLOW YELLOW   APPearance CLEAR CLEAR   Specific Gravity, Urine 1.013 1.005 - 1.030   pH 6.0 5.0 - 8.0   Glucose, UA NEGATIVE NEGATIVE mg/dL   Hgb urine dipstick SMALL (A) NEGATIVE  Bilirubin Urine NEGATIVE NEGATIVE   Ketones, ur 5 (A) NEGATIVE mg/dL   Protein, ur NEGATIVE NEGATIVE mg/dL   Nitrite NEGATIVE NEGATIVE   Leukocytes,Ua NEGATIVE NEGATIVE   RBC / HPF 21-50 0 - 5 RBC/hpf   WBC, UA 0-5 0 - 5 WBC/hpf   Bacteria, UA NONE SEEN NONE SEEN   Squamous Epithelial / HPF 0-5 0 - 5 /HPF   Mucus PRESENT     Assessment & Plan:   Problem List Items Addressed This Visit     Benign prostatic hyperplasia   S/p benign prostate biopsy 11/2023 by Dr Carolynn [facility]  Continues flomax , with recent addition of tadalafil  5mg .  Keep uro f/u scheduled for tomorrow.       Relevant Orders   PSA   Osteopenia   Relevant Orders   VITAMIN D  25 Hydroxy (Vit-D Deficiency, Fractures)   Habitual alcohol use   Has cut down on alcohol intake in interim.       Bipolar 1 disorder (HCC)   Update depakote  levels.       Relevant Orders   Valproic acid  level   HLD (hyperlipidemia)   Relevant Orders   Lipid panel   TSH   Vitamin B1 deficiency   Relevant Orders   Vitamin B1   Vitamin B12 deficiency   Relevant Orders   Vitamin B12   Hyponatremia   Update Na levels       Relevant Orders   CBC with Differential/Platelet   Magnesium    Comprehensive metabolic panel with GFR   Acute UTI (urinary tract infection)   Recent Enterococcus faecalis UTI treated with amoxicillin .       Acute urinary retention - Primary   Acute urinary retention after UTI treated with IM rocephin  and oral amoxicillin  after  failing bactrim  outpatient course.  He will continue flomax  and tadalafil  5mg .  He will keep urology appt tomorrow for foley removal and voiding trial.       Relevant Orders   CBC with Differential/Platelet   Magnesium    Comprehensive metabolic panel with GFR     No orders of the defined types were placed in this encounter.   Orders Placed This Encounter  Procedures   CBC with Differential/Platelet   Magnesium    Comprehensive metabolic panel with GFR   Lipid panel   PSA   TSH   Vitamin B12   Vitamin B1   VITAMIN D  25 Hydroxy (Vit-D Deficiency, Fractures)   Valproic acid  level    Patient Instructions  Labs today to recheck sodium levels, as well as for next physical.  Keep urology appointment for tomorrow.  Keep follow up with me in 2 weeks.   Follow up plan: Return if symptoms worsen or fail to improve.  Anton Blas, MD

## 2024-04-30 NOTE — Assessment & Plan Note (Signed)
 Update depakote  levels.

## 2024-04-30 NOTE — Assessment & Plan Note (Signed)
 Update Na levels

## 2024-04-30 NOTE — Patient Instructions (Addendum)
 Labs today to recheck sodium levels, as well as for next physical.  Keep urology appointment for tomorrow.  Keep follow up with me in 2 weeks.

## 2024-04-30 NOTE — Assessment & Plan Note (Signed)
 Recent Enterococcus faecalis UTI treated with amoxicillin .

## 2024-04-30 NOTE — Assessment & Plan Note (Signed)
 Has cut down on alcohol intake in interim.

## 2024-04-30 NOTE — Assessment & Plan Note (Addendum)
 S/p benign prostate biopsy 11/2023 by Dr Carolynn [facility]  Continues flomax , with recent addition of tadalafil  5mg .  Keep uro f/u scheduled for tomorrow.

## 2024-04-30 NOTE — Assessment & Plan Note (Addendum)
 Acute urinary retention after UTI treated with IM rocephin  and oral amoxicillin  after failing bactrim  outpatient course.  He will continue flomax  and tadalafil  5mg .  He will keep urology appt tomorrow for foley removal and voiding trial.

## 2024-05-01 DIAGNOSIS — R3915 Urgency of urination: Secondary | ICD-10-CM | POA: Diagnosis not present

## 2024-05-01 DIAGNOSIS — N401 Enlarged prostate with lower urinary tract symptoms: Secondary | ICD-10-CM | POA: Diagnosis not present

## 2024-05-01 LAB — TIQ-MISC

## 2024-05-02 ENCOUNTER — Telehealth: Payer: Self-pay

## 2024-05-02 NOTE — Telephone Encounter (Signed)
 Copied from CRM [id number]. Topic: Clinical - Request for Lab/Test Order >> May 02, 2024  9:24 AM Mercedes MATSU wrote: Reason for CRM: [facility] calling about a specimen from the 5th, would like to speak with the CMA if available of Dr. Rilla. CMA of Dr. MATSU was with a patient and stated that she would call her back. Callback for [facility] is:   [phone]   reference number [id number] E.

## 2024-05-02 NOTE — Telephone Encounter (Signed)
 Spoke [place], of [employer], providing ref #. States issue has already been resolved. Nothing else needed at this time.

## 2024-05-03 LAB — VALPROIC ACID LEVEL: Valproic Acid Lvl: 48.8 mg/L — ABNORMAL LOW (ref 50.0–100.0)

## 2024-05-05 ENCOUNTER — Ambulatory Visit: Payer: Self-pay | Admitting: Family Medicine

## 2024-05-05 LAB — VITAMIN B1: Vitamin B1 (Thiamine): 28 nmol/L (ref 8–30)

## 2024-05-08 DIAGNOSIS — R3915 Urgency of urination: Secondary | ICD-10-CM | POA: Diagnosis not present

## 2024-05-08 DIAGNOSIS — R339 Retention of urine, unspecified: Secondary | ICD-10-CM | POA: Diagnosis not present

## 2024-05-08 DIAGNOSIS — N401 Enlarged prostate with lower urinary tract symptoms: Secondary | ICD-10-CM | POA: Diagnosis not present

## 2024-05-10 ENCOUNTER — Other Ambulatory Visit: Payer: Medicare Other

## 2024-05-10 DIAGNOSIS — R972 Elevated prostate specific antigen [PSA]: Secondary | ICD-10-CM | POA: Diagnosis not present

## 2024-05-10 DIAGNOSIS — N401 Enlarged prostate with lower urinary tract symptoms: Secondary | ICD-10-CM | POA: Diagnosis not present

## 2024-05-10 DIAGNOSIS — R338 Other retention of urine: Secondary | ICD-10-CM | POA: Diagnosis not present

## 2024-05-14 ENCOUNTER — Ambulatory Visit (INDEPENDENT_AMBULATORY_CARE_PROVIDER_SITE_OTHER): Payer: Medicare Other

## 2024-05-14 VITALS — BP 130/84 | Ht 70.0 in | Wt 192.0 lb

## 2024-05-14 DIAGNOSIS — Z0000 Encounter for general adult medical examination without abnormal findings: Secondary | ICD-10-CM | POA: Diagnosis not present

## 2024-05-14 DIAGNOSIS — Z2821 Immunization not carried out because of patient refusal: Secondary | ICD-10-CM

## 2024-05-14 NOTE — Patient Instructions (Signed)
 [name] , Thank you for taking time out of your busy schedule to complete your Annual Wellness Visit with me. I enjoyed our conversation and look forward to speaking with you again next year. I, as well as your care team,  appreciate your ongoing commitment to your health goals. Please review the following plan we discussed and let me know if I can assist you in the future. Your Game plan/ To Do List    Referrals: If you haven't heard from the office you've been referred to, please reach out to them at the phone provided.   Follow up Visits: We will see or speak with you next year for your Next Medicare AWV with our clinical staff Have you seen your provider in the last 6 months (3 months if uncontrolled diabetes)? No  Clinician Recommendations:  Aim for 30 minutes of exercise or brisk walking, 6-8 glasses of water, and 5 servings of fruits and vegetables each day.       This is a list of the screenings recommended for you:  Health Maintenance  Topic Date Due   Zoster (Shingles) Vaccine (1 of 2) 03/02/2002   COVID-19 Vaccine (6 - 2024-25 season) 08/15/2023   DTaP/Tdap/Td vaccine (2 - Td or Tdap) 10/30/2023   Flu Shot  12/24/2024*   Medicare Annual Wellness Visit  05/14/2025   Colon Cancer Screening  08/22/2026   Pneumococcal Vaccine for age over 65  Completed   Hepatitis C Screening  Completed   HPV Vaccine  Aged Out   Meningitis B Vaccine  Aged Out  *Topic was postponed. The date shown is not the original due date.    Advanced directives: (In Chart) A copy of your advanced directives are scanned into your chart should your provider ever need it. Advance Care Planning is important because it:  [x]  Makes sure you receive the medical care that is consistent with your values, goals, and preferences  [x]  It provides guidance to your family and loved ones and reduces their decisional burden about whether or not they are making the right decisions based on your wishes.  Follow the  link provided in your after visit summary or read over the paperwork we have mailed to you to help you started getting your Advance Directives in place. If you need assistance in completing these, please reach out to us  so that we can help you!  See attachments for Preventive Care and Fall Prevention Tips.

## 2024-05-14 NOTE — Progress Notes (Signed)
 Because this visit was a virtual/telehealth visit,  certain criteria was not obtained, such a blood pressure, CBG if applicable, and timed get up and go. Any medications not marked as taking were not mentioned during the medication reconciliation part of the visit. Any vitals not documented were not able to be obtained due to this being a telehealth visit or patient was unable to self-report a recent blood pressure reading due to a lack of equipment at home via telehealth. Vitals that have been documented are verbally provided by the patient.  This visit was performed by a medical professional under my direct supervision. I was immediately available for consultation/collaboration. I have reviewed and agree with the Annual Wellness Visit documentation.  Subjective:   Robert Fowler is a 73 y.o. who presents for a Medicare Wellness preventive visit.  As a reminder, Annual Wellness Visits don't include a physical exam, and some assessments may be limited, especially if this visit is performed virtually. We may recommend an in-person follow-up visit with your provider if needed.  Visit Complete: Virtual I connected with  Robert Fowler on 05/14/24 by a audio enabled telemedicine application and verified that I am speaking with the correct person using two identifiers.  Patient Location: Home  Provider Location: Home Office  I discussed the limitations of evaluation and management by telemedicine. The patient expressed understanding and agreed to proceed.  Vital Signs: Because this visit was a virtual/telehealth visit, some criteria may be missing or patient reported. Any vitals not documented were not able to be obtained and vitals that have been documented are patient reported.  VideoDeclined- This patient declined Librarian, academic. Therefore the visit was completed with audio only.  Persons Participating in Visit: Patient.  AWV Questionnaire: No: Patient  Medicare AWV questionnaire was not completed prior to this visit.  Cardiac Risk Factors include: advanced age (>74men, >38 women);male gender;hypertension;dyslipidemia     Objective:    Today's Vitals   05/14/24 1019  BP: 130/84  Weight: 192 lb (87.1 kg)  Height: 5' 10 (1.778 m)   Body mass index is 27.55 kg/m.     05/14/2024   10:25 AM 04/27/2024    2:13 PM 04/20/2024   10:34 AM 05/09/2023    8:29 AM 05/05/2022    8:36 AM 04/28/2021    1:58 PM 04/21/2020   11:17 AM  Advanced Directives  Does Patient Have a Medical Advance Directive? No No No Yes Yes Yes Yes  Type of Theme park manager;Living will Healthcare Power of [place];Living will Healthcare Power of [place];Living will Healthcare Power of [place];Living will  Does patient want to make changes to medical advance directive?    No - Patient declined No - Patient declined    Copy of Healthcare Power of Attorney in Chart?    Yes - validated most recent copy scanned in chart (See row information) No - copy requested Yes - validated most recent copy scanned in chart (See row information) Yes - validated most recent copy scanned in chart (See row information)  Would patient like information on creating a medical advance directive? No - Patient declined No - Patient declined No - Patient declined        Current Medications (verified) Outpatient Encounter Medications as of 05/14/2024  Medication Sig   acetaminophen  (TYLENOL ) 500 MG tablet Take 1,000 mg by mouth at bedtime.   amLODipine  (NORVASC ) 2.5 MG tablet Take 1 tablet (2.5 mg total) by mouth daily.  aspirin  81 MG tablet Take 81 mg by mouth daily.   b complex vitamins capsule Take 1 capsule by mouth daily. (Patient taking differently: Take 1 capsule by mouth every other day.)   benztropine  (COGENTIN ) 0.5 MG tablet Take 0.5 mg by mouth at bedtime.   Calcium  Citrate 333 MG TABS Take 1 tablet by mouth daily. (Patient taking differently: Take 333 mg by  mouth daily.)   Cholecalciferol  (VITAMIN D3 PO) Take 1 capsule by mouth in the morning.   divalproex  (DEPAKOTE  ER) 250 MG 24 hr tablet Take 750 mg by mouth at bedtime.   FLUoxetine  (PROZAC ) 20 MG capsule Take 20 mg by mouth in the morning.   hydrOXYzine  (ATARAX /VISTARIL ) 25 MG tablet Take 25 mg by mouth 2 (two) times daily as needed for anxiety.   OLANZapine  zydis (ZYPREXA ) 10 MG disintegrating tablet Take 10 mg by mouth See admin instructions. Dissolve 10 mg in the mouth at bedtime   OLANZapine  zydis (ZYPREXA ) 5 MG disintegrating tablet Take 5 mg by mouth See admin instructions. Dissolve 5 mg in the mouth in the morning   tadalafil  (CIALIS ) 5 MG tablet Take 1 tablet (5 mg total) by mouth daily.   tamsulosin  (FLOMAX ) 0.4 MG CAPS capsule Take 1 capsule (0.4 mg total) by mouth daily.   No facility-administered encounter medications on file as of 05/14/2024.    Allergies (verified) Poison oak extract   History: Past Medical History:  Diagnosis Date   Arthritis    BENIGN PROSTATIC HYPERTROPHY 07/18/2007   Bipolar disorder (HCC)    Habitual alcohol use    Lesion of oral mucosa 03/21/2018   Biopsy 03/2018 [facility]) - INFLAMMATORY FIBROUS HYPERPLASIA CONSISTENT WITH DENTURE INJURY (EPULIS FISSURATUM).    OSTEOPOROSIS 09/04/2007   DEXA -2.8 lumbar (03/2015)   Pes planus    Prediabetes 2014   Schizoaffective disorder (HCC)    h/o paranoid schizophrenia   Wears dentures    full upper and lower   Past Surgical History:  Procedure Laterality Date   COLONOSCOPY  07/2023   several TAs including 1.2cm one, redundant colon, rpt 3 yrs ([facility])   INGUINAL HERNIA REPAIR Right 08/26/2014   Dr Nalani   MASS EXCISION N/A 04/13/2018   Procedure: EXCISION LOWER LIP ORAL  MASS;  Surgeon: Herminio Miu, MD;  Location: [facility];  Service: ENT;  Laterality: N/A;   Family History  Problem Relation Age of Onset   COPD Mother        smoker   Dementia Mother    Alcohol abuse Father    Alcohol  abuse Maternal Grandmother    CAD Neg Hx    Stroke Neg Hx    Cancer Neg Hx    Diabetes Neg Hx    Hypertension Neg Hx    Social History   Socioeconomic History   Marital status: Single    Spouse name: Not on file   Number of children: Not on file   Years of education: Not on file   Highest education level: Not on file  Occupational History   Not on file  Tobacco Use   Smoking status: Former    Current packs/day: 0.00    Types: Cigarettes    Quit date: 09/26/2006    Years since quitting: 17.6   Smokeless tobacco: Never  Vaping Use   Vaping status: Never Used  Substance and Sexual Activity   Alcohol use: Yes    Comment: occasional   Drug use: Yes    Types: Marijuana  Comment: last smoked 08/22/23   Sexual activity: Not Currently  Other Topics Concern   Not on file  Social History Narrative   Lives alone.     Mother and brother live nearby.   Occupation: Copy at [employer]   Edu: 12th grade   Activity: mows yard   Diet: good water, fruits/vegetables daily      Disability evaluation - no disability (04/2014)   Social Drivers of Health   Financial Resource Strain: Low Risk  (05/14/2024)   Overall Financial Resource Strain (CARDIA)    Difficulty of Paying Living Expenses: Not hard at all  Food Insecurity: No Food Insecurity (05/14/2024)   Hunger Vital Sign    Worried About Running Out of Food in the Last Year: Never true    Ran Out of Food in the Last Year: Never true  Transportation Needs: No Transportation Needs (05/14/2024)   PRAPARE - Administrator, Civil Service (Medical): No    Lack of Transportation (Non-Medical): No  Physical Activity: Inactive (05/14/2024)   Exercise Vital Sign    Days of Exercise per Week: 0 days    Minutes of Exercise per Session: 0 min  Stress: No Stress Concern Present (05/14/2024)   [employer] of Occupational Health - Occupational Stress Questionnaire    Feeling of Stress: Not at all  Social Connections:  Moderately Integrated (05/14/2024)   Social Connection and Isolation Panel    Frequency of Communication with Friends and Family: Twice a week    Frequency of Social Gatherings with Friends and Family: Once a week    Attends Religious Services: More than 4 times per year    Active Member of [employer] or Organizations: Yes    Attends Engineer, structural: More than 4 times per year    Marital Status: Never married    Tobacco Counseling Counseling given: Not Answered    Clinical Intake:  Pre-visit preparation completed: Yes  Pain : No/denies pain     BMI - recorded: 27.55 Nutritional Status: BMI 25 -29 Overweight Nutritional Risks: None Diabetes: No  Lab Results  Component Value Date   HGBA1C 5.3 01/20/2014     How often do you need to have someone help you when you read instructions, pamphlets, or other written materials from your doctor or pharmacy?: 1 - Never What is the last grade level you completed in school?: 12th grade  Interpreter Needed?: No  Information entered by :: Adisyn Ruscitti,CMA   Activities of Daily Living     05/14/2024   10:22 AM 04/20/2024    5:05 PM  In your present state of health, do you have any difficulty performing the following activities:  Hearing? 0 0  Vision? 0 0  Difficulty concentrating or making decisions? 0 1  Walking or climbing stairs? 0   Dressing or bathing? 0   Doing errands, shopping? 0   Preparing Food and eating ? N   Using the Toilet? N   In the past six months, have you accidently leaked urine? N   Do you have problems with loss of bowel control? N   Managing your Medications? N   Managing your Finances? N   Housekeeping or managing your Housekeeping? N     Patient Care Team: Rilla Baller, MD as PCP - General (Family Medicine) Fate Morna SAILOR, [facility] (Inactive) as Pharmacist (Pharmacist)  I have updated your Care Teams any recent Medical Services you may have received from other providers in the  past year.  Assessment:   This is a routine wellness examination for Robert Fowler.  Hearing/Vision screen Hearing Screening - Comments:: Sometimes he has some difficulties hearing but no hearing aids  Vision Screening - Comments:: Patient wears glasses    Goals Addressed             This Visit's Progress    Patient Stated   On track    04/28/2021, I will maintain and continue medications as prescribed.       Depression Screen     05/14/2024   10:25 AM 04/30/2024   10:33 AM 11/17/2023    7:39 AM 05/16/2023    8:09 AM 05/09/2023    8:28 AM 11/09/2022    9:09 AM 05/05/2022    8:28 AM  PHQ 2/9 Scores  PHQ - 2 Score 1 2 3 2  0 2 3  PHQ- 9 Score 3 14 9 6  11 4     Fall Risk     05/14/2024   10:24 AM 04/30/2024   10:32 AM 11/17/2023    7:38 AM 05/16/2023    8:09 AM 05/09/2023    8:21 AM  Fall Risk   Falls in the past year? 1 1 1 1 1   Number falls in past yr: 0 0 1 1 0  Comment     Tripped  Injury with Fall? 0  0 0 0  Risk for fall due to : No Fall Risks   History of fall(s) No Fall Risks  Follow up Falls evaluation completed Falls evaluation completed  Falls evaluation completed Falls prevention discussed;Falls evaluation completed    MEDICARE RISK AT HOME:  Medicare Risk at Home Any stairs in or around the home?: No If so, are there any without handrails?: No Home free of loose throw rugs in walkways, pet beds, electrical cords, etc?: Yes Adequate lighting in your home to reduce risk of falls?: Yes Life alert?: No Use of a cane, walker or w/c?: No Grab bars in the bathroom?: Yes Shower chair or bench in shower?: Yes Elevated toilet seat or a handicapped toilet?: No  TIMED UP AND GO:  Was the test performed?  No  Cognitive Function: 6CIT completed    04/28/2021    2:08 PM 04/21/2020   11:27 AM 03/18/2019   10:27 AM 03/14/2018    8:32 AM 03/08/2017    9:05 AM  MMSE - Mini Mental State Exam  Orientation to time 5 5 5 5 5    Orientation to Place 5 5 5 5 5    Registration 3 3  3 3 3    Attention/ Calculation 5 5 0 0 0   Recall 3 3 3 3 1    Recall-comments     pt was unable to recall 2 of 3 words   Language- name 2 objects   0 0 0   Language- repeat 1 1 1 1 1   Language- follow 3 step command   0 3 3   Language- read & follow direction   0 0 0   Write a sentence   0 0 0   Copy design   0 0 0   Total score   17 20 18       Data saved with a previous flowsheet row definition        05/14/2024   10:21 AM 05/09/2023    8:31 AM 05/05/2022    8:37 AM  6CIT Screen  What Year? 0 points 0 points 0 points  What month? 0 points 0 points 0  points  What time? 0 points 0 points 0 points  Count back from 20 0 points 0 points 0 points  Months in reverse 0 points 0 points 0 points  Repeat phrase 0 points 4 points 0 points  Total Score 0 points 4 points 0 points    Immunizations Immunization History  Administered Date(s) Administered   Fluad Quad(high Dose 65+) 12/01/2020, 07/07/2022, 06/20/2023   Influenza Whole 07/09/2008, 06/26/2013   Influenza, High Dose Seasonal PF 06/09/2018   Influenza, Seasonal, Injecte, Preservative Fre 06/20/2019   Influenza,inj,Quad PF,6+ Mos 07/30/2014, 08/09/2016   Influenza-Unspecified 05/22/2017   PFIZER Comirnaty(Gray Top)Covid-19 Tri-Sucrose Vaccine 07/07/2022, 06/20/2023   PFIZER(Purple Top)SARS-COV-2 Vaccination 11/22/2019, 12/17/2019, 07/09/2020   Pneumococcal Conjugate-13 03/08/2017   Pneumococcal Polysaccharide-23 03/14/2018   Respiratory Syncytial Virus Vaccine,Recomb Aduvanted(Arexvy) 07/21/2022   Tdap 10/29/2013   Zoster, Live 07/30/2014    Screening Tests Health Maintenance  Topic Date Due   Zoster Vaccines- Shingrix (1 of 2) 03/02/2002   COVID-19 Vaccine (6 - 2024-25 season) 08/15/2023   DTaP/Tdap/Td (2 - Td or Tdap) 10/30/2023   INFLUENZA VACCINE  12/24/2024 (Originally 04/26/2024)   Medicare Annual Wellness (AWV)  05/14/2025   Colonoscopy  08/22/2026   Pneumococcal Vaccine: 50+ Years  Completed   Hepatitis C  Screening  Completed   HPV VACCINES  Aged Out   Meningococcal B Vaccine  Aged Out    Health Maintenance  Health Maintenance Due  Topic Date Due   Zoster Vaccines- Shingrix (1 of 2) 03/02/2002   COVID-19 Vaccine (6 - 2024-25 season) 08/15/2023   DTaP/Tdap/Td (2 - Td or Tdap) 10/30/2023   Health Maintenance Items Addressed:patient declined   Additional Screening:  Vision Screening: Recommended annual ophthalmology exams for early detection of glaucoma and other disorders of the eye. Would you like a referral to an eye doctor? No    Dental Screening: Recommended annual dental exams for proper oral hygiene  Community Resource Referral / Chronic Care Management: CRR required this visit?  No   CCM required this visit?  No   Plan:    I have personally reviewed and noted the following in the patient's chart:   Medical and social history Use of alcohol, tobacco or illicit drugs  Current medications and supplements including opioid prescriptions. Patient is not currently taking opioid prescriptions. Functional ability and status Nutritional status Physical activity Advanced directives List of other physicians Hospitalizations, surgeries, and ER visits in previous 12 months Vitals Screenings to include cognitive, depression, and falls Referrals and appointments  In addition, I have reviewed and discussed with patient certain preventive protocols, quality metrics, and best practice recommendations. A written personalized care plan for preventive services as well as general preventive health recommendations were provided to patient.   Lyle MARLA Right, [place]   05/14/2024   After Visit Summary: (MyChart) Due to this being a telephonic visit, the after visit summary with patients personalized plan was offered to patient via MyChart   Notes: Nothing significant to report at this time.

## 2024-05-17 ENCOUNTER — Ambulatory Visit (INDEPENDENT_AMBULATORY_CARE_PROVIDER_SITE_OTHER): Payer: Medicare Other | Admitting: Family Medicine

## 2024-05-17 ENCOUNTER — Encounter: Payer: Self-pay | Admitting: Family Medicine

## 2024-05-17 VITALS — BP 134/80 | HR 74 | Temp 97.8°F | Ht 69.0 in | Wt 188.0 lb

## 2024-05-17 DIAGNOSIS — N401 Enlarged prostate with lower urinary tract symptoms: Secondary | ICD-10-CM | POA: Diagnosis not present

## 2024-05-17 DIAGNOSIS — Z7189 Other specified counseling: Secondary | ICD-10-CM

## 2024-05-17 DIAGNOSIS — F250 Schizoaffective disorder, bipolar type: Secondary | ICD-10-CM

## 2024-05-17 DIAGNOSIS — F319 Bipolar disorder, unspecified: Secondary | ICD-10-CM

## 2024-05-17 DIAGNOSIS — E538 Deficiency of other specified B group vitamins: Secondary | ICD-10-CM

## 2024-05-17 DIAGNOSIS — Z0000 Encounter for general adult medical examination without abnormal findings: Secondary | ICD-10-CM

## 2024-05-17 DIAGNOSIS — E519 Thiamine deficiency, unspecified: Secondary | ICD-10-CM

## 2024-05-17 DIAGNOSIS — M8588 Other specified disorders of bone density and structure, other site: Secondary | ICD-10-CM

## 2024-05-17 DIAGNOSIS — I10 Essential (primary) hypertension: Secondary | ICD-10-CM

## 2024-05-17 DIAGNOSIS — F1090 Alcohol use, unspecified, uncomplicated: Secondary | ICD-10-CM

## 2024-05-17 DIAGNOSIS — E7800 Pure hypercholesterolemia, unspecified: Secondary | ICD-10-CM

## 2024-05-17 DIAGNOSIS — R351 Nocturia: Secondary | ICD-10-CM

## 2024-05-17 MED ORDER — TAMSULOSIN HCL 0.4 MG PO CAPS: 0.4000 mg | ORAL_CAPSULE | Freq: Every day | ORAL | 3 refills | Status: AC

## 2024-05-17 MED ORDER — AMLODIPINE BESYLATE 2.5 MG PO TABS: 2.5000 mg | ORAL_TABLET | Freq: Every day | ORAL | 3 refills | Status: AC

## 2024-05-17 NOTE — Assessment & Plan Note (Signed)
 Chronic, stable off treatment. The 10-year ASCVD risk score (Arnett DK, et al., 2019) is: 22.3%   Values used to calculate the score:     Age: 73 years     Clincally relevant sex: Male     Is Non-Hispanic African American: No     Diabetic: No     Tobacco smoker: No     Systolic Blood Pressure: 134 mmHg     Is BP treated: Yes     HDL Cholesterol: 56 mg/dL     Total Cholesterol: 160 mg/dL

## 2024-05-17 NOTE — Assessment & Plan Note (Signed)
 Continue calcium  and vitamin D , regular weight bearing exercise.

## 2024-05-17 NOTE — Patient Instructions (Addendum)
 Schedule appointment with Dr Prentice to review worsening mood. Consider seeing counselor as well.  Continue cutting down on alcohol.  Labs looking good today.  Return in 3 months for follow up visit.

## 2024-05-17 NOTE — Assessment & Plan Note (Signed)
 Asked to bring us  updated copy

## 2024-05-17 NOTE — Assessment & Plan Note (Addendum)
 Back to drinking 6-8 glasses wine/day Encouraged slowly cutting down.

## 2024-05-17 NOTE — Assessment & Plan Note (Signed)
Chronic, stable on low dose amlodipine - continue.

## 2024-05-17 NOTE — Assessment & Plan Note (Signed)
 Preventative protocols reviewed and updated unless pt declined. Discussed healthy diet and lifestyle.

## 2024-05-17 NOTE — Assessment & Plan Note (Signed)
 Regularly sees psychiatrist.  Encouraged psych f/u given worsening mood in setting of mother's death.

## 2024-05-17 NOTE — Progress Notes (Addendum)
 Ph: [phone] Fax: [phone]   Patient ID: Robert Fowler, male    DOB: 1951-04-03, 73 y.o.   MRN: [id number]  This visit was conducted in person.  BP 134/80   Pulse 74   Temp 97.8 F (36.6 C) (Oral)   Ht 5' 9 (1.753 m)   Wt 188 lb (85.3 kg)   SpO2 95%   BMI 27.76 kg/m    CC: CPE Subjective:   HPI: Robert Fowler is a 73 y.o. male presenting on 05/17/2024 for Annual Exam (AWV was on 05/14/24.)   Saw health advisor earlier this week for medicare wellness visit. Note reviewed.   No results found.  Flowsheet Row Office Visit from 05/17/2024 in [facility] [place]  PHQ-2 Total Score 6       05/17/2024    7:38 AM 05/14/2024   10:24 AM 04/30/2024   10:32 AM 11/17/2023    7:38 AM 05/16/2023    8:09 AM  Fall Risk   Falls in the past year? 1 1 1 1 1   Number falls in past yr: 0 0 0 1 1  Injury with Fall?  0  0 0  Risk for fall due to : No Fall Risks No Fall Risks   History of fall(s)  Follow up  Falls evaluation completed Falls evaluation completed  Falls evaluation completed   Mother passed away 03/26/2024. Brother passed away 2021/12/24. He had alcoholic liver cirrhosis.   Worsened mood recently - attributes to fair-weather friends. Continues struggling with mother's death. Poor social support.    Recent hospitalization last month for hyponatremia associated with UTI and acute urinary retention. CT showed prostatomegaly with bladder distension - foley placed, subsequently removed. Now doing CIC. Regularly sees [facility]. Discussing BPH surgical procedure with UDS prior - pending scheduling.   Sees Dr Prentice psychiatrist at [facility]. Continues depakote  and fluoxetine  20mg  and zyprexa  5/10mg  daily, hydroxyzine  PRN.   Preventative: COLONOSCOPY 07/2023 - several TAs including 1.2cm one, redundant colon, rpt 3 yrs ([facility]) - done for positive iFOB.  Prostate cancer screening - yearly screening with PSA. BPH on DRE 12/2019. Nocturia x2,  continues flomax .  Osteoporosis DEXA -2.8 lumbar (03/2015) - fosamax  started 03/2017.  Osteopenia DEXA -2.1 spine (04/2019)  DEXA 04/2022 - T -1.6 L spine, not at increased fracture risk. Fosamax  stopped in 2023.  Lung cancer screening - not eligible  Flu shot yearly at pharmacy  COVID vaccine - Pfizer 10/2019, 2019/12/25, booster 06/2020  Prevnar-13 26-Mar-2017, pneumovax 2018/03/26, prevnar-20 -forgot to provide Tdap 10/2013  RSV - 04/2023 zostavax - 07/2014  Shingrix - 04/2023, 07/2023 Advanced directive: Scanned into chart 03-26-17. Mother Mercer then sister Landry are HCPOA. Needs to update this.  Seat belt use discussed.  Sunscreen use discussed. No changing moles on skin.  Ex smoker - quit ~2009  Alcohol - continues drinking 6-8 glasses wine/day. Strong fmhx alcoholism.  Occ [facility].  Dentist - has full dentures - had oral mass removed by ENT 2019 - path report consistent with denture injury  Eye exam - yearly, s/p cataract surgery bilateral 03/2022.  Bowel - no constipation  Bladder - see above    Lives alone  Mother and brother live nearby  Occupation: Copy at [employer]  Edu: 12th grade  Activity: mows large yard, as well as at [employer]  Diet: good water, fruits/vegetables daily      Relevant past medical, surgical, family and social history reviewed and updated as indicated.  Interim medical history since our last visit reviewed. Allergies and medications reviewed and updated. Outpatient Medications Prior to Visit  Medication Sig Dispense Refill   acetaminophen  (TYLENOL ) 500 MG tablet Take 1,000 mg by mouth at bedtime.     aspirin  81 MG tablet Take 81 mg by mouth daily.     B Complex Vitamins (VITAMIN B COMPLEX PO) Take 1 capsule by mouth every other day.     benztropine  (COGENTIN ) 0.5 MG tablet Take 0.5 mg by mouth at bedtime.     Calcium  Citrate 333 MG TABS Take 1 tablet by mouth daily. (Patient taking differently: Take 333 mg by mouth daily.)     Cholecalciferol  (VITAMIN D3 PO) Take 1  capsule by mouth in the morning.     divalproex  (DEPAKOTE  ER) 250 MG 24 hr tablet Take 750 mg by mouth at bedtime.     FLUoxetine  (PROZAC ) 20 MG capsule Take 20 mg by mouth in the morning.     hydrOXYzine  (ATARAX /VISTARIL ) 25 MG tablet Take 25 mg by mouth 2 (two) times daily as needed for anxiety.     OLANZapine  zydis (ZYPREXA ) 10 MG disintegrating tablet Take 10 mg by mouth See admin instructions. Dissolve 10 mg in the mouth at bedtime     OLANZapine  zydis (ZYPREXA ) 5 MG disintegrating tablet Take 5 mg by mouth See admin instructions. Dissolve 5 mg in the mouth in the morning     tadalafil  (CIALIS ) 5 MG tablet Take 1 tablet (5 mg total) by mouth daily. 10 tablet 0   amLODipine  (NORVASC ) 2.5 MG tablet Take 1 tablet (2.5 mg total) by mouth daily. 90 tablet 4   b complex vitamins capsule Take 1 capsule by mouth daily. (Patient taking differently: Take 1 capsule by mouth every other day.)     tamsulosin  (FLOMAX ) 0.4 MG CAPS capsule Take 1 capsule (0.4 mg total) by mouth daily. 90 capsule 4   No facility-administered medications prior to visit.     Per HPI unless specifically indicated in ROS section below Review of Systems  Constitutional:  Negative for activity change, appetite change, chills, fatigue, fever and unexpected weight change.  HENT:  Negative for hearing loss.   Eyes:  Negative for visual disturbance.  Respiratory:  Negative for cough, chest tightness, shortness of breath and wheezing.   Cardiovascular:  Negative for chest pain, palpitations and leg swelling.  Gastrointestinal:  Positive for diarrhea. Negative for abdominal distention, abdominal pain, blood in stool, constipation, nausea and vomiting.  Genitourinary:  Positive for hematuria (due to CIC). Negative for difficulty urinating.  Musculoskeletal:  Negative for arthralgias, myalgias and neck pain.  Skin:  Negative for rash.  Neurological:  Positive for dizziness (occ). Negative for seizures, syncope and headaches.   Hematological:  Negative for adenopathy. Does not bruise/bleed easily.  Psychiatric/Behavioral:  Positive for dysphoric mood. The patient is nervous/anxious.     Objective:  BP 134/80   Pulse 74   Temp 97.8 F (36.6 C) (Oral)   Ht 5' 9 (1.753 m)   Wt 188 lb (85.3 kg)   SpO2 95%   BMI 27.76 kg/m   Wt Readings from Last 3 Encounters:  05/17/24 188 lb (85.3 kg)  05/14/24 192 lb (87.1 kg)  04/30/24 192 lb 2 oz (87.1 kg)      Physical Exam Vitals and nursing note reviewed.  Constitutional:      General: He is not in acute distress.    Appearance: Normal appearance. He is well-developed. He is not ill-appearing.  HENT:     Head: Normocephalic and atraumatic.     Right Ear: Hearing, tympanic membrane, ear canal and external ear normal.     Left Ear: Hearing, tympanic membrane, ear canal and external ear normal.     Mouth/Throat:     Mouth: Mucous membranes are dry.     Pharynx: Oropharynx is clear. No oropharyngeal exudate or posterior oropharyngeal erythema.  Eyes:     General: No scleral icterus.    Extraocular Movements: Extraocular movements intact.     Conjunctiva/sclera: Conjunctivae normal.     Pupils: Pupils are equal, round, and reactive to light.  Neck:     Thyroid : No thyroid  mass or thyromegaly.     Vascular: No carotid bruit.  Cardiovascular:     Rate and Rhythm: Normal rate and regular rhythm.     Pulses: Normal pulses.          Radial pulses are 2+ on the right side and 2+ on the left side.     Heart sounds: Normal heart sounds. No murmur heard. Pulmonary:     Effort: Pulmonary effort is normal. No respiratory distress.     Breath sounds: Normal breath sounds. No wheezing, rhonchi or rales.  Abdominal:     General: Bowel sounds are normal. There is no distension.     Palpations: Abdomen is soft. There is no mass.     Tenderness: There is no abdominal tenderness. There is no guarding or rebound.     Hernia: No hernia is present.  Musculoskeletal:         General: Normal range of motion.     Cervical back: Normal range of motion and neck supple.     Right lower leg: No edema.     Left lower leg: No edema.  Lymphadenopathy:     Cervical: No cervical adenopathy.  Skin:    General: Skin is warm and dry.     Findings: No rash.  Neurological:     General: No focal deficit present.     Mental Status: He is alert and oriented to person, place, and time.  Psychiatric:        Mood and Affect: Mood normal.        Behavior: Behavior normal.        Thought Content: Thought content normal.        Judgment: Judgment normal.       Results for orders placed or performed in visit on 04/30/24  CBC with Differential/Platelet   Collection Time: 04/30/24 11:16 AM  Result Value Ref Range   WBC 4.8 4.0 - 10.5 K/uL   RBC 4.37 4.22 - 5.81 Mil/uL   Hemoglobin 13.8 13.0 - 17.0 g/dL   HCT 59.6 60.9 - 47.9 %   MCV 92.3 78.0 - 100.0 fl   MCHC 34.3 30.0 - 36.0 g/dL   RDW 86.4 88.4 - 84.4 %   Platelets 273.0 150.0 - 400.0 K/uL   Neutrophils Relative % 69.0 43.0 - 77.0 %   Lymphocytes Relative 19.8 12.0 - 46.0 %   Monocytes Relative 7.8 3.0 - 12.0 %   Eosinophils Relative 2.2 0.0 - 5.0 %   Basophils Relative 1.2 0.0 - 3.0 %   Neutro Abs 3.3 1.4 - 7.7 K/uL   Lymphs Abs 0.9 0.7 - 4.0 K/uL   Monocytes Absolute 0.4 0.1 - 1.0 K/uL   Eosinophils Absolute 0.1 0.0 - 0.7 K/uL   Basophils Absolute 0.1 0.0 - 0.1 K/uL  Magnesium    Collection  Time: 04/30/24 11:16 AM  Result Value Ref Range   Magnesium  2.0 1.5 - 2.5 mg/dL  Comprehensive metabolic panel with GFR   Collection Time: 04/30/24 11:16 AM  Result Value Ref Range   Sodium 135 135 - 145 mEq/L   Potassium 4.2 3.5 - 5.1 mEq/L   Chloride 98 96 - 112 mEq/L   CO2 29 19 - 32 mEq/L   Glucose, Bld 87 70 - 99 mg/dL   BUN 6 6 - 23 mg/dL   Creatinine, Ser 9.34 0.40 - 1.50 mg/dL   Total Bilirubin 0.6 0.2 - 1.2 mg/dL   Alkaline Phosphatase 71 39 - 117 U/L   AST 12 0 - 37 U/L   ALT 21 0 - 53 U/L   Total Protein  6.7 6.0 - 8.3 g/dL   Albumin 4.4 3.5 - 5.2 g/dL   GFR 05.62 >39.99 mL/min   Calcium  9.9 8.4 - 10.5 mg/dL  Lipid panel   Collection Time: 04/30/24 11:16 AM  Result Value Ref Range   Cholesterol 160 0 - 200 mg/dL   Triglycerides 881.9 0.0 - 149.0 mg/dL   HDL 43.99 >60.99 mg/dL   VLDL 76.3 0.0 - 59.9 mg/dL   LDL Cholesterol 81 0 - 99 mg/dL   Total CHOL/HDL Ratio 3    NonHDL 104.47   PSA   Collection Time: 04/30/24 11:16 AM  Result Value Ref Range   PSA 11.79 (H) 0.10 - 4.00 ng/mL  TSH   Collection Time: 04/30/24 11:16 AM  Result Value Ref Range   TSH 0.83 0.35 - 5.50 uIU/mL  Vitamin B12   Collection Time: 04/30/24 11:16 AM  Result Value Ref Range   Vitamin B-12 653 211 - 911 pg/mL  Vitamin B1   Collection Time: 04/30/24 11:16 AM  Result Value Ref Range   Vitamin B1 (Thiamine ) 28 8 - 30 nmol/L  VITAMIN D  25 Hydroxy (Vit-D Deficiency, Fractures)   Collection Time: 04/30/24 11:16 AM  Result Value Ref Range   VITD 70.02 30.00 - 100.00 ng/mL  TIQ-MISC   Collection Time: 04/30/24 11:30 AM  Result Value Ref Range   QUESTION/PROBLEM:     QUESTION: VERIFY SOURCE OF OT   Valproic acid  level   Collection Time: 04/30/24 11:30 AM  Result Value Ref Range   Valproic Acid  Lvl 48.8 (L) 50.0 - 100.0 mg/L      05/17/2024    7:39 AM 05/14/2024   10:25 AM 04/30/2024   10:33 AM 11/17/2023    7:39 AM 05/16/2023    8:09 AM  Depression screen PHQ 2/9  Decreased Interest 3 0 1 1 1   Down, Depressed, Hopeless 3 1 1 2 1   PHQ - 2 Score 6 1 2 3 2   Altered sleeping 3 0 3 0 0  Tired, decreased energy 3 1 1 2 1   Change in appetite 3 1 1 1  0  Feeling bad or failure about yourself  3 0 1 1 1   Trouble concentrating 3 0 3 1 1   Moving slowly or fidgety/restless 3 0 3 0 1  Suicidal thoughts 3 0 0 1 0  PHQ-9 Score 27 3 14 9 6   Difficult doing work/chores Somewhat difficult Not difficult at all Not difficult at all Somewhat difficult Somewhat difficult  Denies SI/HI to me. States he wouldn't do that.       05/17/2024    7:39 AM 04/30/2024   10:34 AM 11/17/2023    7:39 AM 05/16/2023    8:10 AM  GAD 7 :  Generalized Anxiety Score  Nervous, Anxious, on Edge 3 2 1 1   Control/stop worrying 3 1 1 1   Worry too much - different things 3 3 1 1   Trouble relaxing 3 3 1 1   Restless 3 3 1 1   Easily annoyed or irritable 3 3 1  0  Afraid - awful might happen 3 3 1 1   Total GAD 7 Score 21 18 7 6   Anxiety Difficulty Somewhat difficult Extremely difficult Somewhat difficult Somewhat difficult   Assessment & Plan:   Problem List Items Addressed This Visit     Health maintenance examination - Primary (Chronic)   Preventative protocols reviewed and updated unless pt declined. Discussed healthy diet and lifestyle.       Advanced care planning/counseling discussion (Chronic)   Asked to bring us  updated copy      Benign prostatic hyperplasia   S/p benign prostate biopsy 11/2023 [facility]). Discussing UDS followed by procedure for BPH.       Relevant Medications   tamsulosin  (FLOMAX ) 0.4 MG CAPS capsule   Osteopenia   Continue calcium  and vitamin D , regular weight bearing exercise.       Habitual alcohol use   Back to drinking 6-8 glasses wine/day Encouraged slowly cutting down.       Bipolar 1 disorder [facility])   Regularly sees psychiatrist.  Encouraged psych f/u given worsening mood in setting of mother's death.       Schizoaffective disorder [facility])   Continue psych care.       Essential hypertension   Chronic, stable on low dose amlodipine  - continue.       Relevant Medications   amLODipine  (NORVASC ) 2.5 MG tablet   HLD (hyperlipidemia)   Chronic, stable off treatment. The 10-year ASCVD risk score (Arnett DK, et al., 2019) is: 22.3%   Values used to calculate the score:     Age: 39 years     Clincally relevant sex: Male     Is Non-Hispanic African American: No     Diabetic: No     Tobacco smoker: No     Systolic Blood Pressure: 134 mmHg     Is BP treated: Yes     HDL  Cholesterol: 56 mg/dL     Total Cholesterol: 160 mg/dL       Relevant Medications   amLODipine  (NORVASC ) 2.5 MG tablet   Vitamin B1 deficiency   Continue b1 replacement       Vitamin B12 deficiency   Continue b12 replacement         Meds ordered this encounter  Medications   amLODipine  (NORVASC ) 2.5 MG tablet    Sig: Take 1 tablet (2.5 mg total) by mouth daily.    Dispense:  90 tablet    Refill:  3   tamsulosin  (FLOMAX ) 0.4 MG CAPS capsule    Sig: Take 1 capsule (0.4 mg total) by mouth daily.    Dispense:  90 capsule    Refill:  3    No orders of the defined types were placed in this encounter.   Patient Instructions  Schedule appointment with Dr Prentice to review worsening mood. Consider seeing counselor as well.  Continue cutting down on alcohol.  Labs looking good today.  Return in 3 months for follow up visit.   Follow up plan: Return in about 1 year (around 05/17/2025) for follow up visit.  Anton Blas, MD

## 2024-05-17 NOTE — Assessment & Plan Note (Signed)
 Continue b1 replacement.

## 2024-05-17 NOTE — Assessment & Plan Note (Signed)
 S/p benign prostate biopsy 11/2023 [facility]arene). Discussing UDS followed by procedure for BPH.

## 2024-05-17 NOTE — Assessment & Plan Note (Signed)
 Continue psych care.

## 2024-05-17 NOTE — Assessment & Plan Note (Signed)
Continue b12 replacement.  

## 2024-05-21 ENCOUNTER — Telehealth: Payer: Self-pay | Admitting: Family Medicine

## 2024-05-21 NOTE — Telephone Encounter (Signed)
 Copied from CRM #[id number]. Topic: General - Other >> May 21, 2024  8:31 AM Aleatha BROCKS wrote: Reason for CRM: Patient wanted Dr KANDICE to know he no longer needs a catheter and he can urinate on his own

## 2024-05-22 ENCOUNTER — Other Ambulatory Visit: Payer: Self-pay | Admitting: Urology

## 2024-05-22 DIAGNOSIS — N401 Enlarged prostate with lower urinary tract symptoms: Secondary | ICD-10-CM

## 2024-05-22 NOTE — Telephone Encounter (Signed)
 Noted. Thanks.

## 2024-05-28 ENCOUNTER — Ambulatory Visit: Payer: Self-pay

## 2024-05-28 ENCOUNTER — Telehealth: Payer: Self-pay | Admitting: Family Medicine

## 2024-05-28 NOTE — Telephone Encounter (Signed)
 FYI Only or Action Required?: FYI only for provider.  Patient was last seen in primary care on 05/17/2024 by Rilla Baller, MD.  Called Nurse Triage reporting urinary symptoms.  Symptoms began several days ago.  Interventions attempted: Nothing.  Symptoms are: gradually worsening.  Triage Disposition: See Physician Within 24 Hours  Patient/caregiver understands and will follow disposition?: Yes  Copied from CRM #[id number]. Topic: Clinical - Medication Question >> May 28, 2024  4:23 PM Dedra B wrote: Reason for CRM: Pt called to request something for uti. He said he had a uti before and think it has vcome back. He would like antibiotics called in to [facility] on [address]. Reason for Disposition  Urinating more frequently than usual (i.e., frequency) OR new-onset of the feeling of an urgent need to urinate (i.e., urgency)  Answer Assessment - Initial Assessment Questions 1. SYMPTOM: What's the main symptom you're concerned about? (e.g., frequency, incontinence)     Difficulty urinating, difficulty passing urine 2. ONSET: When did the  symptoms  start?     Three days ago 3. PAIN: Is there any pain? If Yes, ask: How bad is it? (Scale: 1-10; mild, moderate, severe)     Mild pain, fullness of bladder 4. CAUSE: What do you think is causing the symptoms?     UTI 5. OTHER SYMPTOMS: Do you have any other symptoms? (e.g., blood in urine, fever, flank pain, pain with urination)     Pain with urination 6. PREGNANCY: Is there any chance you are pregnant? When was your last menstrual period?     N/A  Protocols used: Urinary Symptoms-A-AH

## 2024-05-28 NOTE — Telephone Encounter (Signed)
 Copied from CRM [id number]. Topic: Clinical - Medication Question >> May 28, 2024  9:37 AM Berneda FALCON wrote: Reason for CRM: Pt states he has another UTI and would like something called in for him. I offered him an appt but he wanted to see if he could just have something called in for him instead please.  Pt states he has frequency, pain with urination, cloudy urine-3-4 days.  If so, please call in to: [id number] [place] - [address] [place] [address] Phone: [phone] Fax: [phone] Hours: Not open 24 hours

## 2024-05-29 ENCOUNTER — Ambulatory Visit (INDEPENDENT_AMBULATORY_CARE_PROVIDER_SITE_OTHER): Admitting: General Practice

## 2024-05-29 ENCOUNTER — Ambulatory Visit: Payer: Self-pay | Admitting: General Practice

## 2024-05-29 ENCOUNTER — Encounter: Payer: Self-pay | Admitting: General Practice

## 2024-05-29 VITALS — BP 136/82 | HR 70 | Temp 97.9°F | Ht 69.0 in | Wt 196.6 lb

## 2024-05-29 DIAGNOSIS — R300 Dysuria: Secondary | ICD-10-CM | POA: Diagnosis not present

## 2024-05-29 DIAGNOSIS — A498 Other bacterial infections of unspecified site: Secondary | ICD-10-CM

## 2024-05-29 LAB — CBC WITH DIFFERENTIAL/PLATELET
Basophils Absolute: 0 K/uL (ref 0.0–0.1)
Basophils Relative: 0.7 % (ref 0.0–3.0)
Eosinophils Absolute: 0.1 K/uL (ref 0.0–0.7)
Eosinophils Relative: 2.2 % (ref 0.0–5.0)
HCT: 41.1 % (ref 39.0–52.0)
Hemoglobin: 14 g/dL (ref 13.0–17.0)
Lymphocytes Relative: 20.9 % (ref 12.0–46.0)
Lymphs Abs: 0.7 K/uL (ref 0.7–4.0)
MCHC: 34.1 g/dL (ref 30.0–36.0)
MCV: 92.5 fl (ref 78.0–100.0)
Monocytes Absolute: 0.4 K/uL (ref 0.1–1.0)
Monocytes Relative: 10.9 % (ref 3.0–12.0)
Neutro Abs: 2.3 K/uL (ref 1.4–7.7)
Neutrophils Relative %: 65.3 % (ref 43.0–77.0)
Platelets: 189 K/uL (ref 150.0–400.0)
RBC: 4.44 Mil/uL (ref 4.22–5.81)
RDW: 13.4 % (ref 11.5–15.5)
WBC: 3.5 K/uL — ABNORMAL LOW (ref 4.0–10.5)

## 2024-05-29 LAB — POC URINALSYSI DIPSTICK (AUTOMATED)
Bilirubin, UA: NEGATIVE
Blood, UA: 10
Glucose, UA: NEGATIVE
Ketones, UA: NEGATIVE
Nitrite, UA: NEGATIVE
Protein, UA: NEGATIVE
Spec Grav, UA: 1.01 (ref 1.010–1.025)
Urobilinogen, UA: 0.2 U/dL
pH, UA: 6 (ref 5.0–8.0)

## 2024-05-29 LAB — BASIC METABOLIC PANEL WITH GFR
BUN: 7 mg/dL (ref 6–23)
CO2: 27 meq/L (ref 19–32)
Calcium: 9.3 mg/dL (ref 8.4–10.5)
Chloride: 102 meq/L (ref 96–112)
Creatinine, Ser: 0.66 mg/dL (ref 0.40–1.50)
GFR: 93.88 mL/min (ref >60.00)
Glucose, Bld: 85 mg/dL (ref 70–99)
Potassium: 3.8 meq/L (ref 3.5–5.1)
Sodium: 135 meq/L (ref 135–145)

## 2024-05-29 MED ORDER — SULFAMETHOXAZOLE-TRIMETHOPRIM 800-160 MG PO TABS: 1.0000 | ORAL_TABLET | Freq: Two times a day (BID) | ORAL | 0 refills | Status: DC

## 2024-05-29 NOTE — Telephone Encounter (Signed)
 No further action needed at this time.  Patient was seen in office today

## 2024-05-29 NOTE — Telephone Encounter (Signed)
 Seen in office today by [facility].

## 2024-05-29 NOTE — Progress Notes (Signed)
 Established Patient Office Visit  Subjective   Patient ID: Robert Fowler, male    DOB: 07/23/1951  Age: 73 y.o. MRN: [id number]  Chief Complaint  Patient presents with   Urinary Frequency    And burning when he urinates. Patient states he has had many utis that come and up. Patient states this episode started about Saturday he thinks.     Urinary Frequency  Associated symptoms include frequency and urgency. Pertinent negatives include no chills, flank pain, hematuria, nausea or vomiting.   Robert Fowler is a 73 year old male, patient of Dr. Rilla, with past medical history of HTN, osteopenia, BPH, bipolar 1 disorder, HLD presents today for an acute visit.   Last evaluated by PCP on 05/17/24. Recent hospitalization- 04/27/24- showed hyponatremia associated with UTI and acute urinary retention. CT abd/pelvis showed prostatomegaly with bladder distention. Foley catheter was placed during the hospitalization. He was then doing CI and following with [facility]. He sent a mychart message on 05/21/24 to PCP that he able to urinate on his own and did not need the catheter anymore.   He sent a mychart message on 05/28/24 that he felt that he has another UTI.   Discussed the use of AI scribe software for clinical note transcription with the patient, who gave verbal consent to proceed.  History of Present Illness Today he reports, He was hospitalized at the beginning of last month for a urinary tract infection and urinary retention, which required catheterization. Post-discharge, he was instructed to self-catheterize at home but found it too painful to continue. He is currently able to urinate independently but experiences increased frequency, needing to urinate every twenty minutes.  On May 28, 2024, he experienced urinary urgency, frequency, and dysuria. His urine was cloudy, but there was no hematuria. He also has a history of dizziness, particularly in the shower, which he states is  not new and unrelated to his urinary symptoms.  He has a known diagnosis of an enlarged prostate, described as 'twice the size it should be,' and has a consultation scheduled with [facility] for a prostate artery embolization on June 03, 2024.  He has been sober for four days after quitting alcohol, which he believes may have contributed to his previous urinary issues. He also mentioned a recent episode of diarrhea, described as loose stools occurring twice in the morning.   Patient Active Problem List   Diagnosis Date Noted   Hyponatremia 04/20/2024   Acute UTI (urinary tract infection) 04/20/2024   Acute urinary retention 04/20/2024   Abnormal CBC 05/16/2023   Vitamin B1 deficiency 05/16/2023   Vitamin B12 deficiency 05/16/2023   HLD (hyperlipidemia) 05/06/2023   Elevated PSA 05/10/2022   Essential hypertension 04/17/2019   Schizoaffective disorder (HCC)    Medicare annual wellness visit, subsequent 03/14/2018   Nocturnal dyspnea 06/13/2017   Bipolar 1 disorder (HCC) 04/11/2017   Advanced care planning/counseling discussion 03/03/2015   Habitual alcohol use    Health maintenance examination 01/27/2014   Pes planus 01/11/2008   Osteopenia 09/04/2007   Ex-smoker 09/04/2007   Hematuria 07/18/2007   Benign prostatic hyperplasia 07/18/2007   Pain and swelling of left lower leg 07/18/2007   Past Medical History:  Diagnosis Date   Arthritis    BENIGN PROSTATIC HYPERTROPHY 07/18/2007   Bipolar disorder (HCC)    Habitual alcohol use    Lesion of oral mucosa 03/21/2018   Biopsy 03/2018 [facility]) - INFLAMMATORY FIBROUS HYPERPLASIA CONSISTENT WITH DENTURE INJURY (EPULIS FISSURATUM).  OSTEOPOROSIS 09/04/2007   DEXA -2.8 lumbar (03/2015)   Pes planus    Prediabetes 2014   Schizoaffective disorder (HCC)    h/o paranoid schizophrenia   Wears dentures    full upper and lower   Past Surgical History:  Procedure Laterality Date   COLONOSCOPY  07/2023   several TAs including  1.2cm one, redundant colon, rpt 3 yrs ([facility])   INGUINAL HERNIA REPAIR Right 08/26/2014   Dr Nalani   MASS EXCISION N/A 04/13/2018   Procedure: EXCISION LOWER LIP ORAL  MASS;  Surgeon: Herminio Miu, MD;  Location: [facility];  Service: ENT;  Laterality: N/A;   Allergies  Allergen Reactions   Poison Oak Extract Rash and Other (See Comments)    Broke out severely         05/29/2024    8:31 AM 05/17/2024    7:39 AM 05/14/2024   10:25 AM  Depression screen PHQ 2/9  Decreased Interest 1 3 0  Down, Depressed, Hopeless 2 3 1   PHQ - 2 Score 3 6 1   Altered sleeping 1 3 0  Tired, decreased energy 2 3 1   Change in appetite 3 3 1   Feeling bad or failure about yourself  3 3 0  Trouble concentrating 3 3 0  Moving slowly or fidgety/restless 3 3 0  Suicidal thoughts 3 3 0  PHQ-9 Score 21 27 3   Difficult doing work/chores Somewhat difficult Somewhat difficult Not difficult at all       05/17/2024    7:39 AM 04/30/2024   10:34 AM 11/17/2023    7:39 AM 05/16/2023    8:10 AM  GAD 7 : Generalized Anxiety Score  Nervous, Anxious, on Edge 3 2 1 1   Control/stop worrying 3 1 1 1   Worry too much - different things 3 3 1 1   Trouble relaxing 3 3 1 1   Restless 3 3 1 1   Easily annoyed or irritable 3 3 1  0  Afraid - awful might happen 3 3 1 1   Total GAD 7 Score 21 18 7 6   Anxiety Difficulty Somewhat difficult Extremely difficult Somewhat difficult Somewhat difficult      Review of Systems  Constitutional:  Negative for chills and fever.  Respiratory:  Negative for shortness of breath.   Cardiovascular:  Negative for chest pain.  Gastrointestinal:  Positive for diarrhea. Negative for abdominal pain, constipation, heartburn, nausea and vomiting.  Genitourinary:  Positive for dysuria, frequency and urgency. Negative for flank pain and hematuria.  Neurological:  Negative for dizziness and headaches.  Endo/Heme/Allergies:  Negative for polydipsia.  Psychiatric/Behavioral:  Negative for  depression and suicidal ideas. The patient is not nervous/anxious.       Objective:     BP 136/82   Pulse 70   Temp 97.9 F (36.6 C) (Oral)   Ht 5' 9 (1.753 m)   Wt 196 lb 9.6 oz (89.2 kg)   SpO2 96%   BMI 29.03 kg/m  BP Readings from Last 3 Encounters:  05/29/24 136/82  05/17/24 134/80  05/14/24 130/84   Wt Readings from Last 3 Encounters:  05/29/24 196 lb 9.6 oz (89.2 kg)  05/17/24 188 lb (85.3 kg)  05/14/24 192 lb (87.1 kg)      Physical Exam Vitals and nursing note reviewed.  Constitutional:      Appearance: Normal appearance.  Cardiovascular:     Rate and Rhythm: Normal rate and regular rhythm.     Pulses: Normal pulses.     Heart sounds:  Normal heart sounds.  Pulmonary:     Effort: Pulmonary effort is normal.     Breath sounds: Normal breath sounds.  Neurological:     Mental Status: He is alert and oriented to person, place, and time.  Psychiatric:        Mood and Affect: Mood normal.        Behavior: Behavior normal.        Thought Content: Thought content normal.        Judgment: Judgment normal.      Results for orders placed or performed in visit on 05/29/24  POCT Urinalysis Dipstick (Automated)  Result Value Ref Range   Color, UA yellow    Clarity, UA cloudy    Glucose, UA Negative Negative   Bilirubin, UA neg    Ketones, UA neg    Spec Grav, UA 1.010 1.010 - 1.025   Blood, UA 10 ery/uL    pH, UA 6.0 5.0 - 8.0   Protein, UA Negative Negative   Urobilinogen, UA 0.2 0.2 or 1.0 E.U./dL   Nitrite, UA neg    Leukocytes, UA Large (3+) (A) Negative       The 10-year ASCVD risk score (Arnett DK, et al., 2019) is: 22.9%    Assessment & Plan:  Dysuria -     POCT Urinalysis Dipstick (Automated) -     CBC with Differential/Platelet -     Basic metabolic panel with GFR -     Urine Culture -     Sulfamethoxazole -Trimethoprim ; Take 1 tablet by mouth 2 (two) times daily. For urinary tract infection.  Dispense: 6 tablet; Refill: 0     Assessment & Plan Urinary tract infection with dysuria in the setting of benign prostatic hyperplasia with lower urinary tract symptoms Urinary urgency, frequency, dysuria, and cloudy urine suggest infection, likely exacerbated by benign prostatic hyperplasia causing urinary retention. - POC UA shows 3+ leuks, negative for nitrites, protein or ketones.  - Urine culture pending. - CBC with diff and CMP STAT pending. - Given his symptoms, presentation and recent catheter use, will go ahead and treat.  - Start Bactrim  DS (sulfamethoxazole /trimethoprim ) tablets for urinary tract infection. Take 1 tablet by mouth twice daily for 3 days. Rx sent. Patient advised antibiotic may need to be changed once urine culture results. - Advise increased water and cranberry juice intake. - Instruct to contact [facility] if symptoms worsen or persist.  Alcohol use, in early remission Four days sober - Commend efforts to abstain from alcohol and encourage continued sobriety.  Acute diarrhea -Loose stools noted. (No watery diarrhea, only soft loose stools). - asked patient to monitor symptoms as it just started this morning.  -advised to drink plenty of fluids and electrolytes.    Return if symptoms worsen or fail to improve.   Carrol Aurora, NP

## 2024-05-29 NOTE — Telephone Encounter (Signed)
 I didn't get message until after [facility] saw pt. Appreciate [place] seeing pt.

## 2024-05-29 NOTE — Patient Instructions (Addendum)
 Stop by the lab prior to leaving today. I will notify you of your results once received.   Start Bactrim  DS (sulfamethoxazole /trimethoprim ) tablets for urinary tract infection. Take 1 tablet by mouth twice daily for 3 days.  I will be in touch with your urine culture results incase I need to change the antibiotics.   As discussed, please go to the ER if your symptoms worsen.   Let me know if you have any questions or concerns.   It was a pleasure meeting you!

## 2024-05-31 LAB — URINE CULTURE
MICRO NUMBER:: 16917018
SPECIMEN QUALITY:: ADEQUATE

## 2024-05-31 MED ORDER — NITROFURANTOIN MONOHYD MACRO 100 MG PO CAPS: 100.0000 mg | ORAL_CAPSULE | Freq: Two times a day (BID) | ORAL | 0 refills | Status: AC

## 2024-05-31 NOTE — Progress Notes (Signed)
 Chief Complaint: Patient was seen in consultation today for benign prostatic hyperplasia with lower urinary tract symptoms.   Referring Physician(s): Winter,Christopher Beverley  History of Present Illness: Robert Fowler is a 73 y.o. male with a medical history significant for bipolar and schizoaffective disorders, HTN, osteopenia and benign prostatic hypertrophy with elevated PSA. He had a negative prostate biopsy March 2025 and has been on tamsulosin  for many years for his lower urinary tract symptoms.  He was hospitalized August 2025 with UTI and acute urinary retention. CT abdomen/pelvis showed prostatomegaly with bladder retention requiring foley catheterization. He followed up with Urology after hospital discharge and was able to get the foley removed but he requires intermittent self-catheterization. He still complains of frequency and sometimes needs to urinary every twenty minutes. He was recently started on tadalafil .  At his last Urology visit he was presented with several treatment options including prostatectomy and prostate artery embolization. Dr. Devere extensively discussed the risk and benefits of both procedures. The patient elected to pursue prostate artery embolization and he was kindly referred to Interventional Radiology        Past Medical History:  Diagnosis Date   Arthritis    BENIGN PROSTATIC HYPERTROPHY 07/18/2007   Bipolar disorder (HCC)    Habitual alcohol use    Lesion of oral mucosa 03/21/2018   Biopsy 03/2018 [facility]) - INFLAMMATORY FIBROUS HYPERPLASIA CONSISTENT WITH DENTURE INJURY (EPULIS FISSURATUM).    OSTEOPOROSIS 09/04/2007   DEXA -2.8 lumbar (03/2015)   Pes planus    Prediabetes 2014   Schizoaffective disorder (HCC)    h/o paranoid schizophrenia   Wears dentures    full upper and lower    Past Surgical History:  Procedure Laterality Date   COLONOSCOPY  07/2023   several TAs including 1.2cm one, redundant colon, rpt 3 yrs ([facility])    INGUINAL HERNIA REPAIR Right 08/26/2014   Dr Nalani   MASS EXCISION N/A 04/13/2018   Procedure: EXCISION LOWER LIP ORAL  MASS;  Surgeon: Herminio Miu, MD;  Location: [facility];  Service: ENT;  Laterality: N/A;    Allergies: Poison oak extract  Medications: Prior to Admission medications   Medication Sig Start Date End Date Taking? Authorizing Provider  acetaminophen  (TYLENOL ) 500 MG tablet Take 1,000 mg by mouth at bedtime.    [provider]  amLODipine  (NORVASC ) 2.5 MG tablet Take 1 tablet (2.5 mg total) by mouth daily. 05/17/24   Rilla Baller, MD  aspirin  81 MG tablet Take 81 mg by mouth daily.    [provider]  B Complex Vitamins (VITAMIN B COMPLEX PO) Take 1 capsule by mouth every other day.    [provider]  benztropine  (COGENTIN ) 0.5 MG tablet Take 0.5 mg by mouth at bedtime.    [provider]  Calcium  Citrate 333 MG TABS Take 1 tablet by mouth daily. Patient taking differently: Take 333 mg by mouth daily. 11/09/22   Rilla Baller, MD  Cholecalciferol  (VITAMIN D3 PO) Take 1 capsule by mouth in the morning.    [provider]  divalproex  (DEPAKOTE  ER) 250 MG 24 hr tablet Take 750 mg by mouth at bedtime. 02/23/18   [provider]  FLUoxetine  (PROZAC ) 20 MG capsule Take 20 mg by mouth in the morning. 02/23/18   [provider]  hydrOXYzine  (ATARAX /VISTARIL ) 25 MG tablet Take 25 mg by mouth 2 (two) times daily as needed for anxiety. 05/07/21   Rilla Baller, MD  nitrofurantoin , macrocrystal-monohydrate, (MACROBID ) 100 MG capsule Take 1  capsule (100 mg total) by mouth 2 (two) times daily for 5 days. 05/31/24 06/05/24  Vincente Shivers, NP  OLANZapine  zydis (ZYPREXA ) 10 MG disintegrating tablet Take 10 mg by mouth See admin instructions. Dissolve 10 mg in the mouth at bedtime    [provider]  OLANZapine  zydis (ZYPREXA ) 5 MG disintegrating tablet Take 5 mg by mouth See admin instructions. Dissolve  5 mg in the mouth in the morning    [provider]  sulfamethoxazole -trimethoprim  (BACTRIM  DS) 800-160 MG tablet Take 1 tablet by mouth 2 (two) times daily. For urinary tract infection. 05/29/24   Vincente Shivers, NP  tadalafil  (CIALIS ) 5 MG tablet Take 1 tablet (5 mg total) by mouth daily. 04/22/24   Pokhrel, Vernal, MD  tamsulosin  (FLOMAX ) 0.4 MG CAPS capsule Take 1 capsule (0.4 mg total) by mouth daily. 05/17/24   Rilla Baller, MD     Family History  Problem Relation Age of Onset   COPD Mother        smoker   Dementia Mother    Alcohol abuse Father    Alcohol abuse Maternal Grandmother    CAD Neg Hx    Stroke Neg Hx    Cancer Neg Hx    Diabetes Neg Hx    Hypertension Neg Hx     Social History   Socioeconomic History   Marital status: Single    Spouse name: Not on file   Number of children: Not on file   Years of education: Not on file   Highest education level: Not on file  Occupational History   Not on file  Tobacco Use   Smoking status: Former    Current packs/day: 0.00    Types: Cigarettes    Quit date: 09/26/2006    Years since quitting: 17.6   Smokeless tobacco: Never  Vaping Use   Vaping status: Never Used  Substance and Sexual Activity   Alcohol use: Yes    Comment: occasional   Drug use: Yes    Types: Marijuana    Comment: last smoked 08/22/23   Sexual activity: Not Currently  Other Topics Concern   Not on file  Social History Narrative   Lives alone.     Mother and brother live nearby.   Occupation: Copy at [employer]   Edu: 12th grade   Activity: mows yard   Diet: good water, fruits/vegetables daily      Disability evaluation - no disability (04/2014)   Social Drivers of Health   Financial Resource Strain: Low Risk  (05/14/2024)   Overall Financial Resource Strain (CARDIA)    Difficulty of Paying Living Expenses: Not hard at all  Food Insecurity: No Food Insecurity (05/14/2024)   Hunger Vital Sign    Worried About Running Out of  Food in the Last Year: Never true    Ran Out of Food in the Last Year: Never true  Transportation Needs: No Transportation Needs (05/14/2024)   PRAPARE - Administrator, Civil Service (Medical): No    Lack of Transportation (Non-Medical): No  Physical Activity: Inactive (05/14/2024)   Exercise Vital Sign    Days of Exercise per Week: 0 days    Minutes of Exercise per Session: 0 min  Stress: No Stress Concern Present (05/14/2024)   [employer] of Occupational Health - Occupational Stress Questionnaire    Feeling of Stress: Not at all  Social Connections: Moderately Integrated (05/14/2024)   Social Connection and Isolation Panel    Frequency of Communication  with Friends and Family: Twice a week    Frequency of Social Gatherings with Friends and Family: Once a week    Attends Religious Services: More than 4 times per year    Active Member of [employer] or Organizations: Yes    Attends Engineer, structural: More than 4 times per year    Marital Status: Never married    Review of Systems: A 12 point ROS discussed and pertinent positives are indicated in the HPI above.  All other systems are negative.  Vital Signs: There were no vitals taken for this visit.  Advance Care Plan: The advanced care plan/surrogate decision maker was discussed at the time of visit and documented in the medical record.    Physical Exam Constitutional:      General: He is not in acute distress. HENT:     Head: Normocephalic.     Nose: Nose normal.     Mouth/Throat:     Mouth: Mucous membranes are moist.  Eyes:     General: No scleral icterus. Cardiovascular:     Rate and Rhythm: Normal rate and regular rhythm.  Pulmonary:     Effort: No respiratory distress.  Abdominal:     General: There is no distension.  Musculoskeletal:     Right lower leg: No edema.     Left lower leg: No edema.  Skin:    General: Skin is warm and dry.  Neurological:     Mental Status: He is alert and  oriented to person, place, and time.     Imaging:  MR Prostate 10/30/23  ~96 g  CT AP 04/20/24    Labs:  CBC: Recent Labs    04/21/24 0354 04/22/24 0409 04/30/24 1116 05/29/24 0841  WBC 5.2 4.8 4.8 3.5*  HGB 12.7* 12.9* 13.8 14.0  HCT 36.2* 37.2* 40.3 41.1  PLT 163 177 273.0 189.0    COAGS: Recent Labs    11/17/23 0822  INR 1.0    BMP: Recent Labs    04/20/24 1126 04/20/24 1846 04/21/24 0354 04/22/24 0409 04/30/24 1116 05/29/24 0841  NA 123* 131* 131* 133* 135 135  K 3.9 3.5 3.9 3.8 4.2 3.8  CL 87* 97* 101 103 98 102  CO2 20* 22 21* 20* 29 27  GLUCOSE 115* 120* 92 96 87 85  BUN 6* 6* 6* 9 6 7   CALCIUM  9.9 9.3 8.7* 8.8* 9.9 9.3  CREATININE 0.78 0.68 0.75 0.66 0.65 0.66  GFRNONAA >60 >60 >60 >60  --   --     LIVER FUNCTION TESTS: Recent Labs    11/17/23 0822 04/20/24 1126 04/21/24 0354 04/30/24 1116  BILITOT 0.9 0.9 1.1 0.6  AST 15 30 41 12  ALT 17 43 46* 21  ALKPHOS 68 131* 89 71  PROT 7.0 7.4 6.2* 6.7  ALBUMIN 4.4 4.6 3.2* 4.4    TUMOR MARKERS: No results for input(s): AFPTM, CEA, CA199, CHROMGRNA in the last 8760 hours.  Assessment and Plan: 73 year old male with a history of benign prostatic hyperplasia (~96 g) with severe lower urinary tract symptoms (IPSS-QoL 23-5) with recent hospitalization for acute urinary retention requiring catheterization.  He is now voiding independently.  We discussed the rationale, periprocedural expectations, and long term expected outcomes after PAE including risks and benefits.  He would like to proceed.  -obtain CTA pelvis prior to procedure day -plan for prostate artery embolization at [facility] with moderate sedation, left radial artery access -we discussed that given his history of  acute urinary retention he would either need to keep a foley catheter in place after the procedure for 7-14 days or perform intermittent self catheterization after the procedure - he elected to keep the foley  as his urethra is sensitive to the self catheterization      Ester Sides, MD Pager: [phone]    I spent a total of  40 Minutes   in face to face in clinical consultation, greater than 50% of which was counseling/coordinating care for benign prostatic hyperplasia.

## 2024-06-03 ENCOUNTER — Ambulatory Visit
Admission: RE | Admit: 2024-06-03 | Discharge: 2024-06-03 | Disposition: A | Discharge status: Home / Self Care | Source: Ambulatory Visit | Attending: Urology

## 2024-06-03 DIAGNOSIS — N401 Enlarged prostate with lower urinary tract symptoms: Secondary | ICD-10-CM | POA: Diagnosis not present

## 2024-06-03 HISTORY — PX: IR RADIOLOGIST EVAL & MGMT: IMG5224

## 2024-06-04 ENCOUNTER — Other Ambulatory Visit: Payer: Self-pay | Admitting: Interventional Radiology

## 2024-06-04 DIAGNOSIS — N401 Enlarged prostate with lower urinary tract symptoms: Secondary | ICD-10-CM

## 2024-06-05 ENCOUNTER — Telehealth (HOSPITAL_COMMUNITY): Payer: Self-pay | Admitting: Radiology

## 2024-06-05 ENCOUNTER — Other Ambulatory Visit (HOSPITAL_COMMUNITY): Payer: Self-pay | Admitting: Interventional Radiology

## 2024-06-05 DIAGNOSIS — N401 Enlarged prostate with lower urinary tract symptoms: Secondary | ICD-10-CM

## 2024-06-05 NOTE — Telephone Encounter (Signed)
 Called pt to schedule PAE with [facility] for 07/11/24 in the PM. Left VM for him to call me back. JM

## 2024-06-11 ENCOUNTER — Ambulatory Visit
Admission: RE | Admit: 2024-06-11 | Discharge: 2024-06-11 | Disposition: A | Discharge status: Home / Self Care | Source: Ambulatory Visit | Attending: Interventional Radiology | Admitting: Interventional Radiology

## 2024-06-11 ENCOUNTER — Encounter: Payer: Self-pay | Admitting: Radiology

## 2024-06-11 DIAGNOSIS — N401 Enlarged prostate with lower urinary tract symptoms: Secondary | ICD-10-CM

## 2024-06-11 DIAGNOSIS — I700 Atherosclerosis of aorta: Secondary | ICD-10-CM | POA: Diagnosis not present

## 2024-06-11 DIAGNOSIS — K8020 Calculus of gallbladder without cholecystitis without obstruction: Secondary | ICD-10-CM | POA: Diagnosis not present

## 2024-06-11 MED ORDER — IOPAMIDOL (ISOVUE-370) INJECTION 76%
100.0000 mL | Freq: Once | INTRAVENOUS | Status: AC | PRN
  Administered 2024-06-11: 100 mL via INTRAVENOUS

## 2024-06-27 DIAGNOSIS — F411 Generalized anxiety disorder: Secondary | ICD-10-CM | POA: Diagnosis not present

## 2024-06-27 DIAGNOSIS — F250 Schizoaffective disorder, bipolar type: Secondary | ICD-10-CM | POA: Diagnosis not present

## 2024-07-08 ENCOUNTER — Telehealth (HOSPITAL_COMMUNITY): Payer: Self-pay | Admitting: Student

## 2024-07-08 ENCOUNTER — Other Ambulatory Visit (HOSPITAL_COMMUNITY): Payer: Self-pay | Admitting: Student

## 2024-07-08 DIAGNOSIS — N401 Enlarged prostate with lower urinary tract symptoms: Secondary | ICD-10-CM

## 2024-07-08 MED ORDER — PHENAZOPYRIDINE HCL 100 MG PO TABS: 100.0000 mg | ORAL_TABLET | Freq: Three times a day (TID) | ORAL | 0 refills | Status: AC

## 2024-07-08 MED ORDER — METHYLPREDNISOLONE 4 MG PO TBPK: ORAL_TABLET | ORAL | 0 refills | Status: DC

## 2024-07-08 MED ORDER — CIPROFLOXACIN HCL 500 MG PO TABS: 500.0000 mg | ORAL_TABLET | Freq: Two times a day (BID) | ORAL | 0 refills | Status: AC

## 2024-07-08 MED ORDER — SOLIFENACIN SUCCINATE 5 MG PO TABS: 5.0000 mg | ORAL_TABLET | Freq: Every day | ORAL | 0 refills | Status: AC

## 2024-07-08 NOTE — Telephone Encounter (Signed)
 Patient scheduled for prostate artery embolization 07/11/24 with Dr. Jennefer. Post-procedure medications were e-prescribed to the [facility] in [place]. Patient is aware he is to begin taking these medications after his PAE. Patient aware to be at [facility] 07/11/24 by 10 am, have a driver and have nothing by mouth after midnight. Patient aware he can take any necessary medications with small sips of water. Patient advised to call the clinic with any questions/concerns prior to his procedure date.   Kainat Pizana, AGACNP-BC 07/08/2024, 9:45 AM

## 2024-07-09 NOTE — H&P (Incomplete)
 Chief Complaint:  Benign prostatic hyperplasia with lower urinary tract symptoms  Procedure: Prostate Artery Embolization   Referring Provider(s): Dr. Lonni Righter Winter  Supervising Physician: Jennefer Rover  Patient Status: [facility] - Out-pt  History of Present Illness: Robert Fowler is a 73 y.o. male with a history of BPH with elevated PSA. Prostate biopsy in 11/2023 was negative. Patient reports being on tamsulosin  for the past several years for his lower urinary tract symptoms. In August 2025 he was hospitalized with a UTI and acute urinary retention. CT A/P at that time showed prostatomegaly (~96g) w/ bladder retention requiring foley catheterization. He followed up in clinic with Urology and was able to get the foley removed; however, he continues to require intermittent self-catheterization. After discussion with Urology about various treatment options, patient presented to IR to discuss possible PAE. IPSS-QoL 23-5. After further discussion with Dr. Jennefer, patient elected to move forward with procedure.CTA pelvis completed on 9/16.   *** Patient is Full Code  Past Medical History:  Diagnosis Date   Arthritis    BENIGN PROSTATIC HYPERTROPHY 07/18/2007   Bipolar disorder (HCC)    Habitual alcohol use    Lesion of oral mucosa 03/21/2018   Biopsy 03/2018 [facility]) - INFLAMMATORY FIBROUS HYPERPLASIA CONSISTENT WITH DENTURE INJURY (EPULIS FISSURATUM).    OSTEOPOROSIS 09/04/2007   DEXA -2.8 lumbar (03/2015)   Pes planus    Prediabetes 2014   Schizoaffective disorder (HCC)    h/o paranoid schizophrenia   Wears dentures    full upper and lower    Past Surgical History:  Procedure Laterality Date   COLONOSCOPY  07/2023   several TAs including 1.2cm one, redundant colon, rpt 3 yrs ([facility])   INGUINAL HERNIA REPAIR Right 08/26/2014   Dr Nalani   IR RADIOLOGIST EVAL & MGMT  06/03/2024   MASS EXCISION N/A 04/13/2018   Procedure: EXCISION LOWER LIP ORAL  MASS;  Surgeon: Herminio Miu, MD;  [facility];  Service: ENT;  Laterality: N/A;    Allergies: Poison oak extract  Medications: Prior to Admission medications   Medication Sig Start Date End Date Taking? Authorizing Provider  acetaminophen  (TYLENOL ) 500 MG tablet Take 1,000 mg by mouth at bedtime.    [provider]  amLODipine  (NORVASC ) 2.5 MG tablet Take 1 tablet (2.5 mg total) by mouth daily. 05/17/24   Rilla Baller, MD  aspirin  81 MG tablet Take 81 mg by mouth daily.    [provider]  B Complex Vitamins (VITAMIN B COMPLEX PO) Take 1 capsule by mouth every other day.    [provider]  benztropine  (COGENTIN ) 0.5 MG tablet Take 0.5 mg by mouth at bedtime.    [provider]  Calcium  Citrate 333 MG TABS Take 1 tablet by mouth daily. Patient taking differently: Take 333 mg by mouth daily. 11/09/22   Rilla Baller, MD  Cholecalciferol  (VITAMIN D3 PO) Take 1 capsule by mouth in the morning.    [provider]  ciprofloxacin  (CIPRO ) 500 MG tablet Take 1 tablet (500 mg total) by mouth 2 (two) times daily for 7 days. 07/08/24 07/15/24  Covington, Jamie R, NP  divalproex  (DEPAKOTE  ER) 250 MG 24 hr tablet Take 750 mg by mouth at bedtime. 02/23/18   [provider]  FLUoxetine  (PROZAC ) 20 MG capsule Take 20 mg by mouth in the morning. 02/23/18   [provider]  hydrOXYzine  (ATARAX /VISTARIL ) 25 MG tablet Take 25 mg by mouth 2 (two) times daily as needed for  anxiety. 05/07/21   Rilla Baller, MD  methylPREDNISolone (MEDROL DOSEPAK) 4 MG TBPK tablet Dispense per instructions on box 07/08/24   Covington, Jamie R, NP  OLANZapine  zydis (ZYPREXA ) 10 MG disintegrating tablet Take 10 mg by mouth See admin instructions. Dissolve 10 mg in the mouth at bedtime    [provider]  OLANZapine  zydis (ZYPREXA ) 5 MG disintegrating tablet Take 5 mg by mouth See admin instructions. Dissolve 5 mg in the mouth in the morning    [provider]  phenazopyridine (PYRIDIUM) 100 MG tablet Take 1 tablet (100 mg total) by mouth in the morning, at noon, and at bedtime for 7 days. 07/08/24 07/15/24  Covington, Jamie R, NP  solifenacin (VESICARE) 5 MG tablet Take 1 tablet (5 mg total) by mouth daily for 7 days. 07/08/24 07/15/24  Covington, Jamie R, NP  sulfamethoxazole -trimethoprim  (BACTRIM  DS) 800-160 MG tablet Take 1 tablet by mouth 2 (two) times daily. For urinary tract infection. 05/29/24   Vincente Shivers, NP  tadalafil  (CIALIS ) 5 MG tablet Take 1 tablet (5 mg total) by mouth daily. 04/22/24   Pokhrel, Vernal, MD  tamsulosin  (FLOMAX ) 0.4 MG CAPS capsule Take 1 capsule (0.4 mg total) by mouth daily. 05/17/24   Rilla Baller, MD     Family History  Problem Relation Age of Onset   COPD Mother        smoker   Dementia Mother    Alcohol abuse Father    Alcohol abuse Maternal Grandmother    CAD Neg Hx    Stroke Neg Hx    Cancer Neg Hx    Diabetes Neg Hx    Hypertension Neg Hx     Social History   Socioeconomic History   Marital status: Single    Spouse name: Not on file   Number of children: Not on file   Years of education: Not on file   Highest education level: Not on file  Occupational History   Not on file  Tobacco Use   Smoking status: Former    Current packs/day: 0.00    Types: Cigarettes    Quit date: 09/26/2006    Years since quitting: 17.7   Smokeless tobacco: Never  Vaping Use   Vaping status: Never Used  Substance and Sexual Activity   Alcohol use: Yes    Comment: occasional   Drug use: Yes    Types: Marijuana    Comment: last smoked 08/22/23   Sexual activity: Not Currently  Other Topics Concern   Not on file  Social History Narrative   Lives alone.     Mother and brother live nearby.   Occupation: Copy at [employer]   Edu: 12th grade   Activity: mows yard   Diet: good water, fruits/vegetables daily      Disability evaluation - no disability (04/2014)   Social Drivers of Health    Financial Resource Strain: Low Risk  (05/14/2024)   Overall Financial Resource Strain (CARDIA)    Difficulty of Paying Living Expenses: Not hard at all  Food Insecurity: No Food Insecurity (05/14/2024)   Hunger Vital Sign    Worried About Running Out of Food in the Last Year: Never true    Ran Out of Food in the Last Year: Never true  Transportation Needs: No Transportation Needs (05/14/2024)   PRAPARE - Administrator, Civil Service (Medical): No    Lack of Transportation (Non-Medical): No  Physical Activity: Inactive (05/14/2024)   Exercise Vital Sign  Days of Exercise per Week: 0 days    Minutes of Exercise per Session: 0 min  Stress: No Stress Concern Present (05/14/2024)   [employer] of Occupational Health - Occupational Stress Questionnaire    Feeling of Stress: Not at all  Social Connections: Moderately Integrated (05/14/2024)   Social Connection and Isolation Panel    Frequency of Communication with Friends and Family: Twice a week    Frequency of Social Gatherings with Friends and Family: Once a week    Attends Religious Services: More than 4 times per year    Active Member of [employer] or Organizations: Yes    Attends Engineer, structural: More than 4 times per year    Marital Status: Never married     Review of Systems Patient denies any headache, chest pain, shortness of breath, abdominal pain, N/V, or fever/chills. All other systems are negative.   Vital Signs: There were no vitals taken for this visit.  Advance Care Plan: {Advance Care Eojw:[id number]}    Physical Exam  Imaging: CT ANGIO PELVIS W OR WO CONTRAST Result Date: 06/15/2024 CLINICAL DATA:  73 year old male with history of benign prostatic hyperplasia with lower urinary tract symptoms. EXAM: CT ANGIOGRAPHY OF PELVIS TECHNIQUE: Multidetector CT imaging of the abdomen and pelvis was performed using the standard protocol during bolus administration of intravenous contrast. Multiplanar  reconstructed images and MIPs were obtained and reviewed to evaluate the vascular anatomy. RADIATION DOSE REDUCTION: This exam was performed according to the departmental dose-optimization program which includes automated exposure control, adjustment of the mA and/or kV according to patient size and/or use of iterative reconstruction technique. CONTRAST:  ISOVUE -370 IOPAMIDOL  (ISOVUE -370) INJECTION 76% COMPARISON:  04/20/2024 FINDINGS: VASCULAR Distal aorta: Patent with circumferential atherosclerotic calcifications. Inflow: Patent without evidence of aneurysm, dissection, vasculitis or significant stenosis. Scattered atherosclerotic calcifications. Proximal Outflow: Bilateral common femoral and visualized portions of the superficial and profunda femoral arteries are patent without evidence of aneurysm, dissection, vasculitis or significant stenosis. Prostatic arteries: The right prostatic artery is difficult to visualize, however appears to be arising from the very proximal internal pudendal artery. The left prostatic artery arises from the proximal common trunk of the inferior gluteal and internal pudendal arteries. Veins: No obvious venous abnormality within the limitations of this arterial phase study. Review of the MIP images confirms the above findings. NON-VASCULAR Urinary Tract: The bladder is distended mild diffuse wall thickening. No evidence of hydroureter. Bowel: No evidence of bowel wall thickening, distention, or inflammatory changes. Lymphatic: No pelvic lymphadenopathy. Reproductive: The prostate gland measures approximately 5.2 x 5.9 x 7.2 cm (AP by trans by cc) with a calculated total volume of 116 g. Other: Similar appearing, partially visualized gallstone within the inferior aspect of the visualized gallbladder. No ascites. Musculoskeletal: No acute or significant osseous findings. IMPRESSION: VASCULAR 1. The right prostatic artery likely arises from the proximal right internal pudendal  artery ([place] type IV). 2. The left prostatic artery arises from the proximal common trunk of the left inferior gluteal and internal pudendal arteries ([place] type II). 3.  Aortic Atherosclerosis (ICD10-I70.0). NON-VASCULAR 1. Prostatomegaly (116 g). 2. Distended bladder with diffuse mild wall thickening indicative of at least a degree of bladder outlet obstruction. 3. Cholelithiasis. Ester Sides, MD [facility] Electronically Signed   By: Ester Sides M.D.   On: 06/15/2024 19:19    Labs:  CBC: Recent Labs    04/21/24 0354 04/22/24 0409 04/30/24 1116 05/29/24 0841  WBC 5.2 4.8  4.8 3.5*  HGB 12.7* 12.9* 13.8 14.0  HCT 36.2* 37.2* 40.3 41.1  PLT 163 177 273.0 189.0    COAGS: Recent Labs    11/17/23 0822  INR 1.0    BMP: Recent Labs    04/20/24 1126 04/20/24 1846 04/21/24 0354 04/22/24 0409 04/30/24 1116 05/29/24 0841  NA 123* 131* 131* 133* 135 135  K 3.9 3.5 3.9 3.8 4.2 3.8  CL 87* 97* 101 103 98 102  CO2 20* 22 21* 20* 29 27  GLUCOSE 115* 120* 92 96 87 85  BUN 6* 6* 6* 9 6 7   CALCIUM  9.9 9.3 8.7* 8.8* 9.9 9.3  CREATININE 0.78 0.68 0.75 0.66 0.65 0.66  GFRNONAA >60 >60 >60 >60  --   --     LIVER FUNCTION TESTS: Recent Labs    11/17/23 0822 04/20/24 1126 04/21/24 0354 04/30/24 1116  BILITOT 0.9 0.9 1.1 0.6  AST 15 30 41 12  ALT 17 43 46* 21  ALKPHOS 68 131* 89 71  PROT 7.0 7.4 6.2* 6.7  ALBUMIN 4.4 4.6 3.2* 4.4    TUMOR MARKERS: No results for input(s): AFPTM, CEA, CA199, CHROMGRNA in the last 8760 hours.  Assessment and Plan:  Benign Prostatic Hyperplasia w/ LUTS: Robert Fowler is a 73 y.o. male with a history of BPH with several years of lower urinary tract symptoms. In August patient was hospitalized due to UTI and acute urinary retention secondary to prostatomegaly. He was subsequently referred to IR to discuss possible PAE. After evaluation in clinic with Dr. Jennefer, he  agreed to move forward with the procedure. He presents to [facility] department today for an image-guided prostate artery embolization with Dr. JONETTA Jennefer. Procedure to be performed under moderate sedation.  -***  The Risks and benefits of embolization were discussed with the patient including, but not limited to bleeding, infection, vascular injury, post operative pain, or contrast induced renal failure.  This procedure involves the use of X-rays and because of the nature of the planned procedure, it is possible that we will have prolonged use of X-ray fluoroscopy.  Potential radiation risks to you include (but are not limited to) the following: - A slightly elevated risk for cancer several years later in life. This risk is typically less than 0.5% percent. This risk is low in comparison to the normal incidence of human cancer, which is 33% for women and 50% for men according to the American Cancer Society. - Radiation induced injury can include skin redness, resembling a rash, tissue breakdown / ulcers and hair loss (which can be temporary or permanent).   The likelihood of either of these occurring depends on the difficulty of the procedure and whether you are sensitive to radiation due to previous procedures, disease, or genetic conditions.   IF your procedure requires a prolonged use of radiation, you will be notified and given written instructions for further action.  It is your responsibility to monitor the irradiated area for the 2 weeks following the procedure and to notify your physician if you are concerned that you have suffered a radiation induced injury.    All of the patient's questions were answered, patient is agreeable to proceed. Consent signed and in chart.   Thank you for allowing our service to participate in Robert Fowler 's care.    Electronically Signed: Collie Kittel M Carline Dura, PA-C   07/09/2024, 3:45 PM     I spent a total of {New  Out-Pt:[id number]} in face to  face in clinical consultation, greater than 50% of which was counseling/coordinating care for prostate artery embolization.

## 2024-07-10 ENCOUNTER — Other Ambulatory Visit: Payer: Self-pay

## 2024-07-11 ENCOUNTER — Other Ambulatory Visit (HOSPITAL_COMMUNITY): Payer: Self-pay | Admitting: Interventional Radiology

## 2024-07-11 ENCOUNTER — Other Ambulatory Visit: Payer: Self-pay

## 2024-07-11 ENCOUNTER — Encounter (HOSPITAL_COMMUNITY): Payer: Self-pay

## 2024-07-11 ENCOUNTER — Ambulatory Visit (HOSPITAL_COMMUNITY)
Admission: RE | Admit: 2024-07-11 | Discharge: 2024-07-11 | Disposition: A | Discharge status: Home / Self Care | Source: Ambulatory Visit | Attending: Interventional Radiology | Admitting: Interventional Radiology

## 2024-07-11 VITALS — BP 139/83 | HR 70 | Temp 98.5°F | Resp 10 | Ht 70.0 in | Wt 194.0 lb

## 2024-07-11 DIAGNOSIS — Z7901 Long term (current) use of anticoagulants: Secondary | ICD-10-CM | POA: Insufficient documentation

## 2024-07-11 DIAGNOSIS — N401 Enlarged prostate with lower urinary tract symptoms: Secondary | ICD-10-CM | POA: Insufficient documentation

## 2024-07-11 DIAGNOSIS — R338 Other retention of urine: Secondary | ICD-10-CM | POA: Diagnosis not present

## 2024-07-11 DIAGNOSIS — Z87891 Personal history of nicotine dependence: Secondary | ICD-10-CM | POA: Diagnosis not present

## 2024-07-11 DIAGNOSIS — N400 Enlarged prostate without lower urinary tract symptoms: Secondary | ICD-10-CM | POA: Diagnosis not present

## 2024-07-11 DIAGNOSIS — Z7982 Long term (current) use of aspirin: Secondary | ICD-10-CM | POA: Insufficient documentation

## 2024-07-11 DIAGNOSIS — R972 Elevated prostate specific antigen [PSA]: Secondary | ICD-10-CM | POA: Insufficient documentation

## 2024-07-11 HISTORY — PX: IR US GUIDE VASC ACCESS LEFT: IMG2389

## 2024-07-11 HISTORY — PX: IR ANGIOGRAM PELVIS SELECTIVE OR SUPRASELECTIVE: IMG661

## 2024-07-11 HISTORY — PX: IR EMBO ARTERIAL NOT HEMORR HEMANG INC GUIDE ROADMAPPING: IMG5448

## 2024-07-11 HISTORY — PX: IR US GUIDE VASC ACCESS RIGHT: IMG2390

## 2024-07-11 HISTORY — PX: IR ANGIOGRAM SELECTIVE EACH ADDITIONAL VESSEL: IMG667

## 2024-07-11 LAB — PROTIME-INR
INR: 0.9 (ref 0.8–1.2)
Prothrombin Time: 12.9 s (ref 11.4–15.2)

## 2024-07-11 LAB — BASIC METABOLIC PANEL WITH GFR
Anion gap: 12 (ref 5–15)
BUN: 5 mg/dL — ABNORMAL LOW (ref 8–23)
CO2: 23 mmol/L (ref 22–32)
Calcium: 9.7 mg/dL (ref 8.9–10.3)
Chloride: 99 mmol/L (ref 98–111)
Creatinine, Ser: 0.7 mg/dL (ref 0.61–1.24)
GFR, Estimated: >60 mL/min (ref >60)
Glucose, Bld: 97 mg/dL (ref 70–99)
Potassium: 4.1 mmol/L (ref 3.5–5.1)
Sodium: 134 mmol/L — ABNORMAL LOW (ref 135–145)

## 2024-07-11 LAB — CBC
HCT: 40.6 % (ref 39.0–52.0)
Hemoglobin: 14.4 g/dL (ref 13.0–17.0)
MCH: 30.3 pg (ref 26.0–34.0)
MCHC: 35.5 g/dL (ref 30.0–36.0)
MCV: 85.3 fL (ref 80.0–100.0)
Platelets: 201 K/uL (ref 150–400)
RBC: 4.76 MIL/uL (ref 4.22–5.81)
RDW: 12.7 % (ref 11.5–15.5)
WBC: 4.5 K/uL (ref 4.0–10.5)
nRBC: 0 % (ref 0.0–0.2)

## 2024-07-11 LAB — GLUCOSE, CAPILLARY: Glucose-Capillary: 100 mg/dL — ABNORMAL HIGH (ref 70–99)

## 2024-07-11 MED ORDER — HEPARIN SODIUM (PORCINE) 1000 UNIT/ML IJ SOLN
INTRAMUSCULAR | Status: AC
  Filled 2024-07-11: qty 10

## 2024-07-11 MED ORDER — NITROGLYCERIN 2 % TD OINT
1.0000 [in_us] | TOPICAL_OINTMENT | Freq: Once | TRANSDERMAL | Status: AC
  Administered 2024-07-11: 1 [in_us] via TOPICAL

## 2024-07-11 MED ORDER — IODIXANOL 320 MG/ML IV SOLN
100.0000 mL | Freq: Once | INTRAVENOUS | Status: AC | PRN
  Administered 2024-07-11: 75 mL via INTRA_ARTERIAL

## 2024-07-11 MED ORDER — NITROGLYCERIN 1 MG/10 ML FOR IR/CATH LAB: INTRA_ARTERIAL | Status: AC | PRN

## 2024-07-11 MED ORDER — CIPROFLOXACIN IN D5W 400 MG/200ML IV SOLN: 400.0000 mg | INTRAVENOUS | Status: AC

## 2024-07-11 MED ORDER — NITROGLYCERIN 1 MG/10 ML FOR IR/CATH LAB
INTRA_ARTERIAL | Status: AC
  Filled 2024-07-11: qty 10

## 2024-07-11 MED ORDER — VERAPAMIL HCL 2.5 MG/ML IV SOLN
INTRAVENOUS | Status: AC
  Filled 2024-07-11: qty 2

## 2024-07-11 MED ORDER — CIPROFLOXACIN IN D5W 400 MG/200ML IV SOLN
INTRAVENOUS | Status: AC
  Filled 2024-07-11: qty 200

## 2024-07-11 MED ORDER — PREDNISONE 20 MG PO TABS: 20.0000 mg | ORAL_TABLET | Freq: Once | ORAL | Status: AC

## 2024-07-11 MED ORDER — LIDOCAINE-EPINEPHRINE 1 %-1:100000 IJ SOLN
INTRAMUSCULAR | Status: AC
  Filled 2024-07-11: qty 1

## 2024-07-11 MED ORDER — MIDAZOLAM HCL 2 MG/2ML IJ SOLN
INTRAMUSCULAR | Status: AC
  Filled 2024-07-11: qty 4

## 2024-07-11 MED ORDER — MIDAZOLAM HCL (PF) 2 MG/2ML IJ SOLN
INTRAMUSCULAR | Status: AC | PRN
  Administered 2024-07-11 (×3): .5 mg via INTRAVENOUS
  Administered 2024-07-11: 1 mg via INTRAVENOUS

## 2024-07-11 MED ORDER — CHLORHEXIDINE GLUCONATE CLOTH 2 % EX PADS: 6.0000 | MEDICATED_PAD | Freq: Every day | CUTANEOUS | Status: DC

## 2024-07-11 MED ORDER — VERAPAMIL HCL 2.5 MG/ML IV SOLN: INTRA_ARTERIAL | Status: AC | PRN

## 2024-07-11 MED ORDER — LIDOCAINE-EPINEPHRINE 1 %-1:100000 IJ SOLN
20.0000 mL | Freq: Once | INTRAMUSCULAR | Status: AC
  Administered 2024-07-11: 5 mL via INTRADERMAL

## 2024-07-11 MED ORDER — FENTANYL CITRATE (PF) 100 MCG/2ML IJ SOLN
INTRAMUSCULAR | Status: AC | PRN
  Administered 2024-07-11: 50 ug via INTRAVENOUS
  Administered 2024-07-11 (×3): 25 ug via INTRAVENOUS

## 2024-07-11 MED ORDER — SODIUM CHLORIDE 0.9 % IV SOLN: INTRAVENOUS | Status: DC

## 2024-07-11 MED ORDER — FENTANYL CITRATE (PF) 100 MCG/2ML IJ SOLN
INTRAMUSCULAR | Status: AC
  Filled 2024-07-11: qty 4

## 2024-07-11 MED ORDER — LIDOCAINE-PRILOCAINE 2.5-2.5 % EX CREA: TOPICAL_CREAM | Freq: Once | CUTANEOUS | Status: AC

## 2024-07-11 MED ORDER — CIPROFLOXACIN IN D5W 400 MG/200ML IV SOLN
INTRAVENOUS | Status: AC | PRN
  Administered 2024-07-11: 400 mg via INTRAVENOUS

## 2024-07-11 MED ORDER — PREDNISONE 20 MG PO TABS
ORAL_TABLET | ORAL | Status: AC
  Administered 2024-07-11: 20 mg via ORAL
  Filled 2024-07-11: qty 1

## 2024-07-11 NOTE — Progress Notes (Signed)
 TR Band removed no s/s of complications. Discharge instructions reviewed with patient, brother and sister at the bedside. PT ambulated in the hallway. Tolerated PO intake. No complaints of N/V. No s/s of complications at incision site. PT remained hemodynamically stable while here. PT escorted from the unit via wheel chair to personal vehicle.

## 2024-07-11 NOTE — Procedures (Signed)
 Interventional Radiology Procedure Note  Procedure: Prostate artery embolization  Findings: Please refer to procedural dictation for full description. Left radial access, 12 cc TR band applied at 14:00.  Complications: None immediate  Estimated Blood Loss: < 5 ml  Recommendations: Keep Foley in place at discharge, will follow up with IR in 10-14 days for voiding trial. TR band release protocol.   Ester Sides, MD

## 2024-07-11 NOTE — H&P (Signed)
 Chief Complaint: Patient was seen in consultation today for  benign prostatic hyperplasia with lower urinary tract symptoms at the request of Winter,Christopher Beverley   Referring Physician(s): Winter,Christopher Beverley   Supervising Physician: Jennefer Rover  Patient Status: [facility] - Out-pt  History of Present Illness: Robert Fowler is a 73 y.o. male with PMHs of  bipolar and schizoaffective disorders, HTN, osteopenia and benign prostatic hypertrophy with LUTS, elevated PSA with negative bx in March 2025 who presents for PAE.   Patient has been on tamsulosin  for years for his LUTS, he was recently referred to Dr. Jennefer by his urology team for possible PAE. During the consultation visit on 06/03/24, he was deemed a good candidate for PAE. After thorough discussion and shared decision making, patient decided to proceed with PAE. Obtaining CTA pelvis prior to the procedure was recommended which he underwent on 06/15/24, no contraindication found. Patient presents to [facility] IR today for the procedure.   Patient laying in bed, not in acute distress.  Denise headache, fever, chills, shortness of breath, cough, chest pain, abdominal pain, nausea ,vomiting, and bleeding. He states that he has been able to urinate w/o difficulty for past 2 weeks, he asks if he really needs the procedure done today. He was notified with his long history of LUTS and recent hospitalization due to urinary retention, he will most likely need the PAE at some point of his life but the PAE is not a procedure that must happen today. After thorough discussion and shared decision making patient decided to proceed with the PAE.   Patient was notified that given his history of acute urinary retention he would either need to keep a foley catheter in place after the procedure for 7-14 days. He will be scheduled for voiding trial at the clinic. He verbalized understanding.   Past Medical History:  Diagnosis Date   Arthritis    BENIGN  PROSTATIC HYPERTROPHY 07/18/2007   Bipolar disorder (HCC)    Habitual alcohol use    Lesion of oral mucosa 03/21/2018   Biopsy 03/2018 [facility]) - INFLAMMATORY FIBROUS HYPERPLASIA CONSISTENT WITH DENTURE INJURY (EPULIS FISSURATUM).    OSTEOPOROSIS 09/04/2007   DEXA -2.8 lumbar (03/2015)   Pes planus    Prediabetes 2014   Schizoaffective disorder (HCC)    h/o paranoid schizophrenia   Wears dentures    full upper and lower    Past Surgical History:  Procedure Laterality Date   COLONOSCOPY  07/2023   several TAs including 1.2cm one, redundant colon, rpt 3 yrs (Danis)   INGUINAL HERNIA REPAIR Right 08/26/2014   Dr Nalani   IR RADIOLOGIST EVAL & MGMT  06/03/2024   MASS EXCISION N/A 04/13/2018   Procedure: EXCISION LOWER LIP ORAL  MASS;  Surgeon: Herminio Miu, MD;  Location: [facility];  Service: ENT;  Laterality: N/A;    Allergies: Poison oak extract  Medications: Prior to Admission medications   Medication Sig Start Date End Date Taking? Authorizing Provider  acetaminophen  (TYLENOL ) 500 MG tablet Take 1,000 mg by mouth at bedtime.   Yes [provider]  amLODipine  (NORVASC ) 2.5 MG tablet Take 1 tablet (2.5 mg total) by mouth daily. 05/17/24  Yes Rilla Baller, MD  aspirin  81 MG tablet Take 81 mg by mouth daily.   Yes [provider]  B Complex Vitamins (VITAMIN B COMPLEX PO) Take 1 capsule by mouth every other day.   Yes [provider]  benztropine  (COGENTIN ) 0.5 MG tablet Take 0.5 mg by mouth  at bedtime.   Yes [provider]  Calcium  Citrate 333 MG TABS Take 1 tablet by mouth daily. Patient taking differently: Take 333 mg by mouth daily. 11/09/22  Yes Rilla Baller, MD  Cholecalciferol  (VITAMIN D3 PO) Take 1 capsule by mouth in the morning.   Yes [provider]  divalproex  (DEPAKOTE  ER) 250 MG 24 hr tablet Take 750 mg by mouth at bedtime. 02/23/18  Yes [provider]  FLUoxetine  (PROZAC ) 20 MG capsule Take 20  mg by mouth in the morning. 02/23/18  Yes [provider]  hydrOXYzine  (ATARAX /VISTARIL ) 25 MG tablet Take 25 mg by mouth 2 (two) times daily as needed for anxiety. 05/07/21  Yes Rilla Baller, MD  OLANZapine  zydis (ZYPREXA ) 10 MG disintegrating tablet Take 10 mg by mouth See admin instructions. Dissolve 10 mg in the mouth at bedtime   Yes [provider]  OLANZapine  zydis (ZYPREXA ) 5 MG disintegrating tablet Take 5 mg by mouth See admin instructions. Dissolve 5 mg in the mouth in the morning   Yes [provider]  tadalafil  (CIALIS ) 5 MG tablet Take 1 tablet (5 mg total) by mouth daily. 04/22/24  Yes Pokhrel, Laxman, MD  tamsulosin  (FLOMAX ) 0.4 MG CAPS capsule Take 1 capsule (0.4 mg total) by mouth daily. 05/17/24  Yes Rilla Baller, MD  ciprofloxacin  (CIPRO ) 500 MG tablet Take 1 tablet (500 mg total) by mouth 2 (two) times daily for 7 days. 07/08/24 07/15/24  Covington, Jamie R, NP  methylPREDNISolone (MEDROL DOSEPAK) 4 MG TBPK tablet Dispense per instructions on box 07/08/24   Covington, Jamie R, NP  phenazopyridine (PYRIDIUM) 100 MG tablet Take 1 tablet (100 mg total) by mouth in the morning, at noon, and at bedtime for 7 days. 07/08/24 07/15/24  Covington, Jamie R, NP  solifenacin (VESICARE) 5 MG tablet Take 1 tablet (5 mg total) by mouth daily for 7 days. 07/08/24 07/15/24  Covington, Jamie R, NP  sulfamethoxazole -trimethoprim  (BACTRIM  DS) 800-160 MG tablet Take 1 tablet by mouth 2 (two) times daily. For urinary tract infection. 05/29/24   Vincente Shivers, NP     Family History  Problem Relation Age of Onset   COPD Mother        smoker   Dementia Mother    Alcohol abuse Father    Alcohol abuse Maternal Grandmother    CAD Neg Hx    Stroke Neg Hx    Cancer Neg Hx    Diabetes Neg Hx    Hypertension Neg Hx     Social History   Socioeconomic History   Marital status: Single    Spouse name: Not on file   Number of children: Not on file   Years of education:  Not on file   Highest education level: Not on file  Occupational History   Not on file  Tobacco Use   Smoking status: Former    Current packs/day: 0.00    Types: Cigarettes    Quit date: 09/26/2006    Years since quitting: 17.8   Smokeless tobacco: Never  Vaping Use   Vaping status: Never Used  Substance and Sexual Activity   Alcohol use: Yes    Comment: occasional   Drug use: Yes    Types: Marijuana    Comment: last smoked 08/22/23   Sexual activity: Not Currently  Other Topics Concern   Not on file  Social History Narrative   Lives alone.     Mother and brother live nearby.   Occupation: Copy at [employer]   Edu: 12th grade   Activity: mows yard   Diet: good water, fruits/vegetables daily      Disability evaluation - no disability (04/2014)   Social Drivers of Health   Financial Resource Strain: Low Risk  (05/14/2024)   Overall Financial Resource Strain (CARDIA)    Difficulty of Paying Living Expenses: Not hard at all  Food Insecurity: No Food Insecurity (05/14/2024)   Hunger Vital Sign    Worried About Running Out of Food in the Last Year: Never true    Ran Out of Food in the Last Year: Never true  Transportation Needs: No Transportation Needs (05/14/2024)   PRAPARE - Administrator, Civil Service (Medical): No    Lack of Transportation (Non-Medical): No  Physical Activity: Inactive (05/14/2024)   Exercise Vital Sign    Days of Exercise per Week: 0 days    Minutes of Exercise per Session: 0 min  Stress: No Stress Concern Present (05/14/2024)   [employer] of Occupational Health - Occupational Stress Questionnaire    Feeling of Stress: Not at all  Social Connections: Moderately Integrated (05/14/2024)   Social Connection and Isolation Panel    Frequency of Communication with Friends and Family: Twice a week    Frequency of Social Gatherings with Friends and Family: Once a week    Attends Religious Services: More than 4 times per year    Active  Member of [employer] or Organizations: Yes    Attends Engineer, structural: More than 4 times per year    Marital Status: Never married     Review of Systems: A 12 point ROS discussed and pertinent positives are indicated in the HPI above.  All other systems are negative.  Vital Signs: BP (!) 140/99   Pulse 82   Temp 98.5 F (36.9 C) (Oral)   Resp 16   Ht 5' 10 (1.778 m)   Wt 194 lb (88 kg)   SpO2 94%   BMI 27.84 kg/m    Physical Exam Vitals reviewed.  Constitutional:      General: He is not in acute distress.    Appearance: He is not ill-appearing.  HENT:     Head: Normocephalic and atraumatic.     Mouth/Throat:     Mouth: Mucous membranes are moist.     Pharynx: Oropharynx is clear.  Cardiovascular:     Rate and Rhythm: Normal rate and regular rhythm.     Heart sounds: Normal heart sounds.  Pulmonary:     Effort: Pulmonary effort is normal.     Breath sounds: Normal breath sounds.  Abdominal:     General: Abdomen is flat.     Palpations: Abdomen is soft.  Genitourinary:    Comments: Foley in place  Musculoskeletal:     Cervical back: Neck supple.  Skin:    General: Skin is warm and dry.     Coloration: Skin is not jaundiced or pale.  Neurological:     Mental Status: He is alert and oriented to person, place, and time.  Psychiatric:        Mood and Affect: Mood normal.        Behavior: Behavior normal.        Judgment: Judgment normal.     MD Evaluation Airway: WNL Heart: WNL Abdomen: WNL Chest/ Lungs: WNL ASA  Classification: 2 Mallampati/Airway Score: Two  Imaging: No results found.  Labs:  CBC: Recent Labs    04/22/24 0409 04/30/24 1116 05/29/24  9158 07/11/24 1035  WBC 4.8 4.8 3.5* 4.5  HGB 12.9* 13.8 14.0 14.4  HCT 37.2* 40.3 41.1 40.6  PLT 177 273.0 189.0 201    COAGS: Recent Labs    11/17/23 0822 07/11/24 1035  INR 1.0 0.9    BMP: Recent Labs    04/20/24 1846 04/21/24 0354 04/22/24 0409 04/30/24 1116  05/29/24 0841 07/11/24 1035  NA 131* 131* 133* 135 135 134*  K 3.5 3.9 3.8 4.2 3.8 4.1  CL 97* 101 103 98 102 99  CO2 22 21* 20* 29 27 23   GLUCOSE 120* 92 96 87 85 97  BUN 6* 6* 9 6 7  5*  CALCIUM  9.3 8.7* 8.8* 9.9 9.3 9.7  CREATININE 0.68 0.75 0.66 0.65 0.66 0.70  GFRNONAA >60 >60 >60  --   --  >60    LIVER FUNCTION TESTS: Recent Labs    11/17/23 0822 04/20/24 1126 04/21/24 0354 04/30/24 1116  BILITOT 0.9 0.9 1.1 0.6  AST 15 30 41 12  ALT 17 43 46* 21  ALKPHOS 68 131* 89 71  PROT 7.0 7.4 6.2* 6.7  ALBUMIN 4.4 4.6 3.2* 4.4    TUMOR MARKERS: No results for input(s): AFPTM, CEA, CA199, CHROMGRNA in the last 8760 hours.  Assessment and Plan: 73 y.o. male with BPA with LUTS and recent hospitalization for acute urinary retention who presents for PAE.   VSS CBC wnl INR 0.9 BMP stable  Allergies reviewed  On ASA 81 mg   The Risks and benefits of prostate artery embolization were discussed with the patient including, but not limited to bleeding, infection, vascular injury, post procedural pain, or contrast induced renal failure.  This procedure involves the use of X-rays and because of the nature of the planned procedure, it is possible that we will have prolonged use of X-ray fluoroscopy.  Potential radiation risks to you include (but are not limited to) the following: - A slightly elevated risk for cancer several years later in life. This risk is typically less than 0.5% percent. This risk is low in comparison to the normal incidence of human cancer, which is 33% for women and 50% for men according to the American Cancer Society. - Radiation induced injury can include skin redness, resembling a rash, tissue breakdown / ulcers and hair loss (which can be temporary or permanent).   The likelihood of either of these occurring depends on the difficulty of the procedure and whether you are sensitive to radiation due to previous procedures, disease, or genetic  conditions.   IF your procedure requires a prolonged use of radiation, you will be notified and given written instructions for further action.  It is your responsibility to monitor the irradiated area for the 2 weeks following the procedure and to notify your physician if you are concerned that you have suffered a radiation induced injury.    All of the patient's questions were answered, patient is agreeable to proceed. Consent signed and in chart.    Thank you for this interesting consult.  I greatly enjoyed meeting Robert Fowler and look forward to participating in their care.  A copy of this report was sent to the requesting provider on this date.  Electronically Signed: Toya VEAR Cousin, PA-C 07/11/2024, 12:16 PM   I spent a total of    40 Minutes in face to face in clinical consultation, greater than 50% of which was counseling/coordinating care for PAE.   This chart was dictated using voice recognition software.  Despite  best efforts to proofread,  errors can occur which can change the documentation meaning.

## 2024-07-11 NOTE — Discharge Instructions (Signed)

## 2024-07-12 ENCOUNTER — Other Ambulatory Visit: Payer: Self-pay | Admitting: Interventional Radiology

## 2024-07-12 DIAGNOSIS — R338 Other retention of urine: Secondary | ICD-10-CM

## 2024-07-18 DIAGNOSIS — N401 Enlarged prostate with lower urinary tract symptoms: Secondary | ICD-10-CM | POA: Diagnosis not present

## 2024-07-18 DIAGNOSIS — R338 Other retention of urine: Secondary | ICD-10-CM | POA: Diagnosis not present

## 2024-07-24 DIAGNOSIS — N401 Enlarged prostate with lower urinary tract symptoms: Secondary | ICD-10-CM | POA: Diagnosis not present

## 2024-07-24 DIAGNOSIS — R338 Other retention of urine: Secondary | ICD-10-CM | POA: Diagnosis not present

## 2024-07-24 NOTE — Progress Notes (Signed)
 Referring Physician(s): Devere Lonni Righter   Chief Complaint: The patient is seen in follow up today s/p prostate artery embolization 07/11/24.   History of present illness:  HPI from initial consultation 06/03/24 Robert Fowler is a 73 y.o. male with a medical history significant for bipolar and schizoaffective disorders, HTN, osteopenia and benign prostatic hypertrophy with elevated PSA. He had a negative prostate biopsy March 2025 and has been on tamsulosin  for many years for his lower urinary tract symptoms.   He was hospitalized August 2025 with UTI and acute urinary retention. CT abdomen/pelvis showed prostatomegaly with bladder retention requiring foley catheterization. He followed up with Urology after hospital discharge and was able to get the foley removed but he requires intermittent self-catheterization. He still complains of frequency and sometimes needs to urinate every twenty minutes. He was recently started on tadalafil .   At his last Urology visit he was presented with several treatment options including prostatectomy and prostate artery embolization. Dr. Devere extensively discussed the risk and benefits of both procedures. The patient elected to pursue prostate artery embolization and he was kindly referred to Interventional Radiology.   We met in consultation 06/03/24 and at that time he was voiding independently. We discussed the rationale, periprocedural expectations, and long term expected outcomes after PAE including risks and benefits. He expressed a desire to proceed and we discussed that given his history of acute urinary retention he would either need to keep a foley catheter in place after the procedure for 7-14 days or perform intermittent self catheterization after the procedure. He elected to keep the foley as his urethra is sensitive to the self catheterization.   He presented to [facility] IR department 07/11/24 and underwent a technically successful  bilateral prostate artery embolization. He tolerated the procedure well and was discharged home (with foley catheter in place) the same day.   He presents to the IR clinic today for follow up and voiding trial.    He had his foley catheter removed last week at [facility] after passing a voiding trial.  He says that his lower urinary tract symptoms are significantly improved.  He does report an occasional split stream.  His IPSS-QoL today is 8-1.  Past Medical History:  Diagnosis Date   Arthritis    BENIGN PROSTATIC HYPERTROPHY 07/18/2007   Bipolar disorder (HCC)    Habitual alcohol use    Lesion of oral mucosa 03/21/2018   Biopsy 03/2018 [facility]) - INFLAMMATORY FIBROUS HYPERPLASIA CONSISTENT WITH DENTURE INJURY (EPULIS FISSURATUM).    OSTEOPOROSIS 09/04/2007   DEXA -2.8 lumbar (03/2015)   Pes planus    Prediabetes 2014   Schizoaffective disorder (HCC)    h/o paranoid schizophrenia   Wears dentures    full upper and lower    Past Surgical History:  Procedure Laterality Date   COLONOSCOPY  07/2023   several TAs including 1.2cm one, redundant colon, rpt 3 yrs (Danis)   INGUINAL HERNIA REPAIR Right 08/26/2014   Dr Nalani   IR ANGIOGRAM PELVIS SELECTIVE OR SUPRASELECTIVE  07/11/2024   IR ANGIOGRAM SELECTIVE EACH ADDITIONAL VESSEL  07/11/2024   IR ANGIOGRAM SELECTIVE EACH ADDITIONAL VESSEL  07/11/2024   IR EMBO ARTERIAL NOT HEMORR HEMANG INC GUIDE ROADMAPPING  07/11/2024   IR RADIOLOGIST EVAL & MGMT  06/03/2024   IR US  GUIDE VASC ACCESS LEFT  07/11/2024   IR US  GUIDE VASC ACCESS LEFT  07/11/2024   IR US  GUIDE VASC ACCESS RIGHT  07/11/2024   MASS EXCISION N/A  04/13/2018   Procedure: EXCISION LOWER LIP ORAL  MASS;  Surgeon: Herminio Miu, MD;  Location: [facility];  Service: ENT;  Laterality: N/A;    Allergies: Poison oak extract  Medications: Prior to Admission medications   Medication Sig Start Date End Date Taking? Authorizing Provider  acetaminophen  (TYLENOL ) 500  MG tablet Take 1,000 mg by mouth at bedtime.    [provider]  amLODipine  (NORVASC ) 2.5 MG tablet Take 1 tablet (2.5 mg total) by mouth daily. 05/17/24   Rilla Baller, MD  aspirin  81 MG tablet Take 81 mg by mouth daily.    [provider]  B Complex Vitamins (VITAMIN B COMPLEX PO) Take 1 capsule by mouth every other day.    [provider]  benztropine  (COGENTIN ) 0.5 MG tablet Take 0.5 mg by mouth at bedtime.    [provider]  Calcium  Citrate 333 MG TABS Take 1 tablet by mouth daily. Patient taking differently: Take 333 mg by mouth daily. 11/09/22   Rilla Baller, MD  Cholecalciferol  (VITAMIN D3 PO) Take 1 capsule by mouth in the morning.    [provider]  divalproex  (DEPAKOTE  ER) 250 MG 24 hr tablet Take 750 mg by mouth at bedtime. 02/23/18   [provider]  FLUoxetine  (PROZAC ) 20 MG capsule Take 20 mg by mouth in the morning. 02/23/18   [provider]  hydrOXYzine  (ATARAX /VISTARIL ) 25 MG tablet Take 25 mg by mouth 2 (two) times daily as needed for anxiety. 05/07/21   Rilla Baller, MD  methylPREDNISolone (MEDROL DOSEPAK) 4 MG TBPK tablet Dispense per instructions on box 07/08/24   Covington, Jamie R, NP  OLANZapine  zydis (ZYPREXA ) 10 MG disintegrating tablet Take 10 mg by mouth See admin instructions. Dissolve 10 mg in the mouth at bedtime    [provider]  OLANZapine  zydis (ZYPREXA ) 5 MG disintegrating tablet Take 5 mg by mouth See admin instructions. Dissolve 5 mg in the mouth in the morning    [provider]  sulfamethoxazole -trimethoprim  (BACTRIM  DS) 800-160 MG tablet Take 1 tablet by mouth 2 (two) times daily. For urinary tract infection. 05/29/24   Vincente Shivers, NP  tadalafil  (CIALIS ) 5 MG tablet Take 1 tablet (5 mg total) by mouth daily. 04/22/24   Pokhrel, Vernal, MD  tamsulosin  (FLOMAX ) 0.4 MG CAPS capsule Take 1 capsule (0.4 mg total) by mouth daily. 05/17/24   Rilla Baller, MD      Family History  Problem Relation Age of Onset   COPD Mother        smoker   Dementia Mother    Alcohol abuse Father    Alcohol abuse Maternal Grandmother    CAD Neg Hx    Stroke Neg Hx    Cancer Neg Hx    Diabetes Neg Hx    Hypertension Neg Hx     Social History   Socioeconomic History   Marital status: Single    Spouse name: Not on file   Number of children: Not on file   Years of education: Not on file   Highest education level: Not on file  Occupational History   Not on file  Tobacco Use   Smoking status: Former    Current packs/day: 0.00    Types: Cigarettes    Quit date: 09/26/2006    Years since quitting: 17.8   Smokeless tobacco: Never  Vaping Use   Vaping status: Never Used  Substance and Sexual Activity   Alcohol use: Yes    Comment: occasional  Drug use: Yes    Types: Marijuana    Comment: last smoked 08/22/23   Sexual activity: Not Currently  Other Topics Concern   Not on file  Social History Narrative   Lives alone.     Mother and brother live nearby.   Occupation: copy at [employer]   Edu: 12th grade   Activity: mows yard   Diet: good water, fruits/vegetables daily      Disability evaluation - no disability (04/2014)   Social Drivers of Health   Financial Resource Strain: Low Risk  (05/14/2024)   Overall Financial Resource Strain (CARDIA)    Difficulty of Paying Living Expenses: Not hard at all  Food Insecurity: No Food Insecurity (05/14/2024)   Hunger Vital Sign    Worried About Running Out of Food in the Last Year: Never true    Ran Out of Food in the Last Year: Never true  Transportation Needs: No Transportation Needs (05/14/2024)   PRAPARE - Administrator, Civil Service (Medical): No    Lack of Transportation (Non-Medical): No  Physical Activity: Inactive (05/14/2024)   Exercise Vital Sign    Days of Exercise per Week: 0 days    Minutes of Exercise per Session: 0 min  Stress: No Stress Concern Present (05/14/2024)    [employer] of Occupational Health - Occupational Stress Questionnaire    Feeling of Stress: Not at all  Social Connections: Moderately Integrated (05/14/2024)   Social Connection and Isolation Panel    Frequency of Communication with Friends and Family: Twice a week    Frequency of Social Gatherings with Friends and Family: Once a week    Attends Religious Services: More than 4 times per year    Active Member of [employer] or Organizations: Yes    Attends Engineer, Structural: More than 4 times per year    Marital Status: Never married     Vital Signs: There were no vitals taken for this visit.  Physical Exam Constitutional:      General: He is not in acute distress. HENT:     Head: Normocephalic.     Nose: Nose normal.  Eyes:     General: No scleral icterus. Cardiovascular:     Rate and Rhythm: Normal rate and regular rhythm.  Pulmonary:     Effort: No respiratory distress.  Abdominal:     General: There is no distension.  Skin:    General: Skin is warm and dry.  Neurological:     Mental Status: He is alert and oriented to person, place, and time.     Imaging:  MR Prostate 10/30/23  ~96 g   CT AP 04/20/24       Labs:  CBC: Recent Labs    04/22/24 0409 04/30/24 1116 05/29/24 0841 07/11/24 1035  WBC 4.8 4.8 3.5* 4.5  HGB 12.9* 13.8 14.0 14.4  HCT 37.2* 40.3 41.1 40.6  PLT 177 273.0 189.0 201    COAGS: Recent Labs    11/17/23 0822 07/11/24 1035  INR 1.0 0.9    BMP: Recent Labs    04/20/24 1846 04/21/24 0354 04/22/24 0409 04/30/24 1116 05/29/24 0841 07/11/24 1035  NA 131* 131* 133* 135 135 134*  K 3.5 3.9 3.8 4.2 3.8 4.1  CL 97* 101 103 98 102 99  CO2 22 21* 20* 29 27 23   GLUCOSE 120* 92 96 87 85 97  BUN 6* 6* 9 6 7  5*  CALCIUM  9.3 8.7* 8.8* 9.9 9.3 9.7  CREATININE 0.68 0.75 0.66 0.65 0.66 0.70  GFRNONAA >60 >60 >60  --   --  >60    LIVER FUNCTION TESTS: Recent Labs    11/17/23 0822 04/20/24 1126 04/21/24 0354  04/30/24 1116  BILITOT 0.9 0.9 1.1 0.6  AST 15 30 41 12  ALT 17 43 46* 21  ALKPHOS 68 131* 89 71  PROT 7.0 7.4 6.2* 6.7  ALBUMIN 4.4 4.6 3.2* 4.4    Assessment and Plan:  73 year old male with a history of benign prostatic hyperplasia (~96 g) with severe lower urinary tract symptoms (IPSS-QoL 23-5) with recent hospitalization for acute urinary retention requiring catheterization. He regained the ability to void independently but elected to pursue prostate artery embolization to mitigate future lower urinary tract issues. He underwent a technically successful bilateral prostate artery embolization 07/11/24 and was discharged from the hospital with a foley catheter in place which was removed last week at the Urology office.  His IPSS-QoL is significantly improved to 8-1.  He is very pleased with the results.  Follow up in 3 months with MRI pelvis.  Ester Sides, MD Pager: [phone]    I spent a total of 25 Minutes in face to face in clinical consultation, greater than 50% of which was counseling/coordinating care for benign prostatic hyperplasia.

## 2024-07-26 ENCOUNTER — Ambulatory Visit
Admission: RE | Admit: 2024-07-26 | Discharge: 2024-07-26 | Disposition: A | Discharge status: Home / Self Care | Source: Ambulatory Visit | Attending: Student | Admitting: Student

## 2024-07-26 ENCOUNTER — Ambulatory Visit
Admission: RE | Admit: 2024-07-26 | Discharge: 2024-07-26 | Disposition: A | Discharge status: Home / Self Care | Source: Ambulatory Visit | Attending: Interventional Radiology | Admitting: Interventional Radiology

## 2024-07-26 DIAGNOSIS — N401 Enlarged prostate with lower urinary tract symptoms: Secondary | ICD-10-CM | POA: Diagnosis not present

## 2024-07-26 DIAGNOSIS — R338 Other retention of urine: Secondary | ICD-10-CM

## 2024-07-26 HISTORY — PX: IR RADIOLOGIST EVAL & MGMT: IMG5224

## 2024-07-31 DIAGNOSIS — N401 Enlarged prostate with lower urinary tract symptoms: Secondary | ICD-10-CM | POA: Diagnosis not present

## 2024-07-31 DIAGNOSIS — R3914 Feeling of incomplete bladder emptying: Secondary | ICD-10-CM | POA: Diagnosis not present

## 2024-08-27 ENCOUNTER — Ambulatory Visit: Admitting: Family Medicine

## 2024-08-27 ENCOUNTER — Encounter: Payer: Self-pay | Admitting: Family Medicine

## 2024-08-27 VITALS — BP 138/80 | HR 79 | Temp 98.0°F | Ht 68.5 in | Wt 190.8 lb

## 2024-08-27 DIAGNOSIS — F1090 Alcohol use, unspecified, uncomplicated: Secondary | ICD-10-CM

## 2024-08-27 DIAGNOSIS — N401 Enlarged prostate with lower urinary tract symptoms: Secondary | ICD-10-CM | POA: Diagnosis not present

## 2024-08-27 DIAGNOSIS — R338 Other retention of urine: Secondary | ICD-10-CM

## 2024-08-27 NOTE — Progress Notes (Signed)
 Ph: [phone] Fax: [phone]   Patient ID: Robert Fowler, male    DOB: 10/22/1950, 73 y.o.   MRN: [id number]  This visit was conducted in person.  BP 138/80 (BP Location: Right Arm, Patient Position: Sitting, Cuff Size: Normal)   Pulse 79   Temp 98 F (36.7 C) (Oral)   Ht 5' 8.5 (1.74 m)   Wt 190 lb 12.8 oz (86.5 kg)   SpO2 97%   BMI 28.59 kg/m    CC: f/u visit urinary retention  Subjective:   HPI: Robert Fowler is a 73 y.o. male presenting on 08/27/2024 for Medical Management of Chronic Issues (Pt is following up on urinary retention, he got a catheter placed and urology and states he has been having issues with it and would like to discuss other options)   Hospitalized 03/2024 for hyponatremia associated with UTI and acute urinary retention. CT showed prostatomegaly with bladder distension - foley placed, subsequently removed. Continues CIC, regularly seeing [facility] Dr Devere, latest note available 04/2024 reviewed. Referred to IR for bilateral prostate artery embolization, completed 07/11/2024. Plan rpt pelvic MRI 3 months. Has urodynamic studies planned for tomorrow and urology f/u planned 09/09/2024.   No recent fevers/chills, dysuria, abd pain.   He also had elevated PSA s/p benign prostate biopsy 11/2023.  Developed Enterococcal UTI 05/2024 treated with 7d macrobid  course.  Alcohol use - off and on habitual use up to 6-8 glasses of wine daily. He states he quit drinking 05/27/2024!      Relevant past medical, surgical, family and social history reviewed and updated as indicated. Interim medical history since our last visit reviewed. Allergies and medications reviewed and updated. Outpatient Medications Prior to Visit  Medication Sig Dispense Refill   acetaminophen  (TYLENOL ) 500 MG tablet Take 1,000 mg by mouth at bedtime.     amLODipine  (NORVASC ) 2.5 MG tablet Take 1 tablet (2.5 mg total) by mouth daily. 90 tablet 3   aspirin  81 MG tablet Take 81 mg  by mouth daily.     B Complex Vitamins (VITAMIN B COMPLEX PO) Take 1 capsule by mouth every other day.     benztropine  (COGENTIN ) 0.5 MG tablet Take 0.5 mg by mouth at bedtime.     Calcium  Citrate 333 MG TABS Take 1 tablet by mouth daily. (Patient taking differently: Take 333 mg by mouth daily.)     Cholecalciferol  (VITAMIN D3 PO) Take 1 capsule by mouth in the morning.     divalproex  (DEPAKOTE  ER) 250 MG 24 hr tablet Take 750 mg by mouth at bedtime.     FLUoxetine  (PROZAC ) 20 MG capsule Take 20 mg by mouth in the morning.     hydrOXYzine  (ATARAX /VISTARIL ) 25 MG tablet Take 25 mg by mouth 2 (two) times daily as needed for anxiety.     OLANZapine  zydis (ZYPREXA ) 10 MG disintegrating tablet Take 10 mg by mouth See admin instructions. Dissolve 10 mg in the mouth at bedtime     OLANZapine  zydis (ZYPREXA ) 5 MG disintegrating tablet Take 5 mg by mouth See admin instructions. Dissolve 5 mg in the mouth in the morning     tadalafil  (CIALIS ) 5 MG tablet Take 1 tablet (5 mg total) by mouth daily. 10 tablet 0   methylPREDNISolone  (MEDROL  DOSEPAK) 4 MG TBPK tablet Dispense per instructions on box 21 tablet 0   sulfamethoxazole -trimethoprim  (BACTRIM  DS) 800-160 MG tablet Take 1 tablet by mouth 2 (two) times daily. For urinary tract infection. 6 tablet 0  tamsulosin  (FLOMAX ) 0.4 MG CAPS capsule Take 1 capsule (0.4 mg total) by mouth daily. (Patient not taking: Reported on 08/27/2024) 90 capsule 3   No facility-administered medications prior to visit.     Per HPI unless specifically indicated in ROS section below Review of Systems  Objective:  BP 138/80 (BP Location: Right Arm, Patient Position: Sitting, Cuff Size: Normal)   Pulse 79   Temp 98 F (36.7 C) (Oral)   Ht 5' 8.5 (1.74 m)   Wt 190 lb 12.8 oz (86.5 kg)   SpO2 97%   BMI 28.59 kg/m   Wt Readings from Last 3 Encounters:  08/27/24 190 lb 12.8 oz (86.5 kg)  07/11/24 194 lb (88 kg)  05/29/24 196 lb 9.6 oz (89.2 kg)      Physical  Exam Vitals and nursing note reviewed.  Constitutional:      Appearance: Normal appearance. He is not ill-appearing.  HENT:     Head: Normocephalic and atraumatic.     Mouth/Throat:     Mouth: Mucous membranes are dry.     Pharynx: Oropharynx is clear. No oropharyngeal exudate or posterior oropharyngeal erythema.  Eyes:     Extraocular Movements: Extraocular movements intact.     Pupils: Pupils are equal, round, and reactive to light.  Cardiovascular:     Rate and Rhythm: Normal rate and regular rhythm.     Pulses: Normal pulses.     Heart sounds: Normal heart sounds. No murmur heard. Pulmonary:     Effort: Pulmonary effort is normal. No respiratory distress.     Breath sounds: Normal breath sounds. No wheezing, rhonchi or rales.  Abdominal:     General: Bowel sounds are normal. There is no distension.     Palpations: Abdomen is soft. There is no mass.     Tenderness: There is no abdominal tenderness. There is no guarding or rebound.     Hernia: No hernia is present.  Musculoskeletal:     Right lower leg: No edema.     Left lower leg: No edema.  Skin:    General: Skin is warm and dry.     Findings: No rash.  Neurological:     Mental Status: He is alert.  Psychiatric:        Mood and Affect: Mood normal.        Behavior: Behavior normal.       Results for orders placed or performed during the hospital encounter of 07/11/24  Glucose, capillary   Collection Time: 07/11/24 10:32 AM  Result Value Ref Range   Glucose-Capillary 100 (H) 70 - 99 mg/dL  CBC   Collection Time: 07/11/24 10:35 AM  Result Value Ref Range   WBC 4.5 4.0 - 10.5 K/uL   RBC 4.76 4.22 - 5.81 MIL/uL   Hemoglobin 14.4 13.0 - 17.0 g/dL   HCT 59.3 60.9 - 47.9 %   MCV 85.3 80.0 - 100.0 fL   MCH 30.3 26.0 - 34.0 pg   MCHC 35.5 30.0 - 36.0 g/dL   RDW 87.2 88.4 - 84.4 %   Platelets 201 150 - 400 K/uL   nRBC 0.0 0.0 - 0.2 %  Protime-INR   Collection Time: 07/11/24 10:35 AM  Result Value Ref Range    Prothrombin Time 12.9 11.4 - 15.2 seconds   INR 0.9 0.8 - 1.2  Basic metabolic panel   Collection Time: 07/11/24 10:35 AM  Result Value Ref Range   Sodium 134 (L) 135 - 145 mmol/L   Potassium  4.1 3.5 - 5.1 mmol/L   Chloride 99 98 - 111 mmol/L   CO2 23 22 - 32 mmol/L   Glucose, Bld 97 70 - 99 mg/dL   BUN 5 (L) 8 - 23 mg/dL   Creatinine, Ser 9.29 0.61 - 1.24 mg/dL   Calcium  9.7 8.9 - 10.3 mg/dL   GFR, Estimated >39 >39 mL/min   Anion gap 12 5 - 15    Assessment & Plan:   Problem List Items Addressed This Visit     Benign prostatic hyperplasia - Primary   Marked BPH with urinary retention s/p prostate artery embolization 06/2024  Currently with foley bag to right thigh in place.  Has UDS planned tomorrow with urology and uro f/u 09/09/2024.  Reviewed anticipated course of recovery, hopeful to be able to remain off foley after tomorrow.  Continue flomax  at this time.       Habitual alcohol use   Full alcohol abstinence since 05/27/2024 - encouraged continued abstinence.         No orders of the defined types were placed in this encounter.   No orders of the defined types were placed in this encounter.   Patient Instructions  I'm glad you're doing ok and have upcoming bladder studies tomorrow.  Keep follow up with Dr Devere 12/15 Continue flomax  (tamsulosin ). Stay off alcohol.  Return after August 22nd 2026 for physical/wellness visit  Follow up plan: Return in about 9 months (around 05/17/2025) for annual exam, prior fasting for blood work, [facility] visit.  Anton Blas, MD

## 2024-08-27 NOTE — Assessment & Plan Note (Signed)
 Full alcohol abstinence since 05/27/2024 - encouraged continued abstinence.

## 2024-08-27 NOTE — Patient Instructions (Addendum)
 I'm glad you're doing ok and have upcoming bladder studies tomorrow.  Keep follow up with Dr Devere 12/15 Continue flomax  (tamsulosin ). Stay off alcohol.  Return after August 22nd 2026 for physical/wellness visit

## 2024-08-27 NOTE — Assessment & Plan Note (Signed)
 Marked BPH with urinary retention s/p prostate artery embolization 06/2024  Currently with foley bag to right thigh in place.  Has UDS planned tomorrow with urology and uro f/u 09/09/2024.  Reviewed anticipated course of recovery, hopeful to be able to remain off foley after tomorrow.  Continue flomax  at this time.

## 2024-08-28 DIAGNOSIS — R338 Other retention of urine: Secondary | ICD-10-CM | POA: Diagnosis not present

## 2024-09-09 DIAGNOSIS — N401 Enlarged prostate with lower urinary tract symptoms: Secondary | ICD-10-CM | POA: Diagnosis not present

## 2024-09-09 DIAGNOSIS — R338 Other retention of urine: Secondary | ICD-10-CM | POA: Diagnosis not present

## 2024-09-14 ENCOUNTER — Other Ambulatory Visit: Payer: Self-pay

## 2024-09-14 ENCOUNTER — Emergency Department (HOSPITAL_COMMUNITY)
Admission: EM | Admit: 2024-09-14 | Discharge: 2024-09-14 | Disposition: A | Discharge status: Home / Self Care | Attending: Emergency Medicine | Admitting: Emergency Medicine

## 2024-09-14 DIAGNOSIS — R339 Retention of urine, unspecified: Secondary | ICD-10-CM

## 2024-09-14 DIAGNOSIS — Z7982 Long term (current) use of aspirin: Secondary | ICD-10-CM | POA: Diagnosis not present

## 2024-09-14 DIAGNOSIS — N390 Urinary tract infection, site not specified: Secondary | ICD-10-CM | POA: Insufficient documentation

## 2024-09-14 DIAGNOSIS — R300 Dysuria: Secondary | ICD-10-CM | POA: Diagnosis present

## 2024-09-14 DIAGNOSIS — Z79899 Other long term (current) drug therapy: Secondary | ICD-10-CM | POA: Insufficient documentation

## 2024-09-14 LAB — CBC
HCT: 38.1 % — ABNORMAL LOW (ref 39.0–52.0)
Hemoglobin: 13.3 g/dL (ref 13.0–17.0)
MCH: 29.1 pg (ref 26.0–34.0)
MCHC: 34.9 g/dL (ref 30.0–36.0)
MCV: 83.4 fL (ref 80.0–100.0)
Platelets: 189 K/uL (ref 150–400)
RBC: 4.57 MIL/uL (ref 4.22–5.81)
RDW: 14 % (ref 11.5–15.5)
WBC: 10 K/uL (ref 4.0–10.5)
nRBC: 0 % (ref 0.0–0.2)

## 2024-09-14 LAB — URINALYSIS, ROUTINE W REFLEX MICROSCOPIC
Bilirubin Urine: NEGATIVE
Glucose, UA: NEGATIVE mg/dL
Ketones, ur: NEGATIVE mg/dL
Nitrite: NEGATIVE
Protein, ur: NEGATIVE mg/dL
Specific Gravity, Urine: 1.004 — ABNORMAL LOW (ref 1.005–1.030)
WBC, UA: >50 WBC/hpf (ref 0–5)
pH: 7 (ref 5.0–8.0)

## 2024-09-14 LAB — BASIC METABOLIC PANEL WITH GFR
Anion gap: 11 (ref 5–15)
BUN: 6 mg/dL — ABNORMAL LOW (ref 8–23)
CO2: 23 mmol/L (ref 22–32)
Calcium: 9.7 mg/dL (ref 8.9–10.3)
Chloride: 97 mmol/L — ABNORMAL LOW (ref 98–111)
Creatinine, Ser: 0.65 mg/dL (ref 0.61–1.24)
GFR, Estimated: >60 mL/min
Glucose, Bld: 103 mg/dL — ABNORMAL HIGH (ref 70–99)
Potassium: 4 mmol/L (ref 3.5–5.1)
Sodium: 132 mmol/L — ABNORMAL LOW (ref 135–145)

## 2024-09-14 MED ORDER — AMOXICILLIN-POT CLAVULANATE 875-125 MG PO TABS: 1.0000 | ORAL_TABLET | Freq: Two times a day (BID) | ORAL | 0 refills | Status: DC

## 2024-09-14 NOTE — ED Notes (Signed)
 Swapped catheter drainage bag to a leg bag

## 2024-09-14 NOTE — ED Provider Notes (Signed)
 " [place] EMERGENCY DEPARTMENT AT [facility] Provider Note   CSN: [id number] Arrival date & time: 09/14/24  1322     Patient presents with: Dysuria   Robert Fowler is a 73 y.o. male.    Dysuria Presenting symptoms: dysuria   Patient dents with difficulty urinating.  Had a fair amount of  difficulty urinating.  States has not had a good urination around 3 days.  History of prostate embolization around 2 months ago.  Has had some issues urinating since.  Recently followed up with urology.  Has follow-up in January for it.  However states now decreased urination.  Has previously had to self catheter.     Prior to Admission medications  Medication Sig Start Date End Date Taking? Authorizing Provider  acetaminophen  (TYLENOL ) 500 MG tablet Take 1,000 mg by mouth at bedtime.    [provider]  amLODipine  (NORVASC ) 2.5 MG tablet Take 1 tablet (2.5 mg total) by mouth daily. 05/17/24   Rilla Baller, MD  amoxicillin -clavulanate (AUGMENTIN ) 875-125 MG tablet Take 1 tablet by mouth every 12 (twelve) hours. 09/14/24   Patsey Lot, MD  aspirin  81 MG tablet Take 81 mg by mouth daily.    [provider]  B Complex Vitamins (VITAMIN B COMPLEX PO) Take 1 capsule by mouth every other day.    [provider]  benztropine  (COGENTIN ) 0.5 MG tablet Take 0.5 mg by mouth at bedtime.    [provider]  Calcium  Citrate 333 MG TABS Take 1 tablet by mouth daily. Patient taking differently: Take 333 mg by mouth daily. 11/09/22   Rilla Baller, MD  Cholecalciferol  (VITAMIN D3 PO) Take 1 capsule by mouth in the morning.    [provider]  divalproex  (DEPAKOTE  ER) 250 MG 24 hr tablet Take 750 mg by mouth at bedtime. 02/23/18   [provider]  FLUoxetine  (PROZAC ) 20 MG capsule Take 20 mg by mouth in the morning. 02/23/18   [provider]  hydrOXYzine  (ATARAX /VISTARIL ) 25 MG tablet Take 25 mg by mouth 2 (two) times daily  as needed for anxiety. 05/07/21   Rilla Baller, MD  OLANZapine  zydis (ZYPREXA ) 10 MG disintegrating tablet Take 10 mg by mouth See admin instructions. Dissolve 10 mg in the mouth at bedtime    [provider]  OLANZapine  zydis (ZYPREXA ) 5 MG disintegrating tablet Take 5 mg by mouth See admin instructions. Dissolve 5 mg in the mouth in the morning    [provider]  tadalafil  (CIALIS ) 5 MG tablet Take 1 tablet (5 mg total) by mouth daily. 04/22/24   Pokhrel, Vernal, MD  tamsulosin  (FLOMAX ) 0.4 MG CAPS capsule Take 1 capsule (0.4 mg total) by mouth daily. Patient not taking: Reported on 08/27/2024 05/17/24   Rilla Baller, MD    Allergies: Poison oak extract    Review of Systems  Genitourinary:  Positive for dysuria.    Updated Vital Signs BP (!) 160/92 (BP Location: Left Arm)   Pulse 81   Temp 97.8 F (36.6 C) (Oral)   Resp 18   SpO2 99%   Physical Exam Vitals and nursing note reviewed.  Abdominal:     Tenderness: There is abdominal tenderness.     Comments: Suprapubic tenderness and fullness.  Neurological:     Mental Status: He is alert.     (all labs ordered are listed, but only abnormal results are displayed) Labs Reviewed  URINALYSIS, ROUTINE W REFLEX MICROSCOPIC - Abnormal; Notable for the following components:  Result Value   APPearance CLOUDY (*)    Specific Gravity, Urine 1.004 (*)    Hgb urine dipstick MODERATE (*)    Leukocytes,Ua LARGE (*)    Bacteria, UA RARE (*)    Non Squamous Epithelial 0-5 (*)    All other components within normal limits  BASIC METABOLIC PANEL WITH GFR - Abnormal; Notable for the following components:   Sodium 132 (*)    Chloride 97 (*)    Glucose, Bld 103 (*)    BUN 6 (*)    All other components within normal limits  CBC - Abnormal; Notable for the following components:   HCT 38.1 (*)    All other components within normal limits  URINE CULTURE    EKG: None  Radiology: No results  found.   Procedures   Medications Ordered in the ED - No data to display                                  Medical Decision Making Amount and/or Complexity of Data Reviewed Labs: ordered.  Risk Prescription drug management.   Patient with urinary retention.  History of same.  Previous prostate embolization.  Clinically appears to be retaining.  Urine does show some leukocytes and rare bacteria.  Will get basic blood work to evaluate for white count and kidney function since it has been around 3 days since good urination.  Will also place Foley catheter.  Catheter had been placed with large amount of urine.  Feeling much better.  Urine is cloudy.  I think with dysuria she she needs to be treated for UTI.  Reviewed cultures and has had Enterococcus faecalis recently.  Discussed with ED pharmacist and will give Augmentin .  Will have follow-up with urology.  Will discharge home with Foley catheter.       Final diagnoses:  Urinary retention  Urinary tract infection without hematuria, site unspecified    ED Discharge Orders          Ordered    amoxicillin -clavulanate (AUGMENTIN ) 875-125 MG tablet  Every 12 hours,   Status:  Discontinued        09/14/24 2010    amoxicillin -clavulanate (AUGMENTIN ) 875-125 MG tablet  Every 12 hours        09/14/24 2025               Patsey Lot, MD 09/14/24 2026  "

## 2024-09-14 NOTE — ED Triage Notes (Signed)
 Pt has c/o painful urination and hasn't been able to urinate for 2 hours.

## 2024-09-17 LAB — URINE CULTURE: Culture: >=100000 — AB

## 2024-09-18 ENCOUNTER — Telehealth (HOSPITAL_BASED_OUTPATIENT_CLINIC_OR_DEPARTMENT_OTHER): Payer: Self-pay | Admitting: *Deleted

## 2024-09-18 NOTE — Telephone Encounter (Signed)
 Post ED Visit - Positive Culture Follow-up: Successful Patient Follow-Up  Culture assessed and recommendations reviewed by:  []  Rankin Dee, Pharm.D. []  Venetia Gully, Pharm.D., BCPS AQ-ID []  Garrel Crews, Pharm.D., BCPS []  Almarie Lunger, [address].D., BCPS []  [place], [address].D., BCPS, AAHIVP []  Rosaline Bihari, Pharm.D., BCPS, AAHIVP []  Vernell Meier, PharmD, BCPS []  Latanya Hint, PharmD, BCPS []  Donald Medley, PharmD, BCPS [x]  Damien Quiet, PharmD  Positive urine culture  []  Patient discharged without antimicrobial prescription and treatment is now indicated [x]  Organism is resistant to prescribed ED discharge antimicrobial []  Patient with positive blood cultures  Changes discussed with ED provider: [name], MD  Spoke with pt regarding culture and pt stated he went to [facility] yesterday and they prescrbed him Cipro  500mg  BID.  Informed pharmacist above. No changes made  Contacted patient, date 09/18/24, time 1112   Robert Fowler 09/18/2024, 11:11 AM

## 2024-09-18 NOTE — Telephone Encounter (Signed)
 Post ED Visit - Positive Culture Follow-up: Unsuccessful Patient Follow-up  Culture assessed and recommendations reviewed by:  [x]  Damien Quiet, Pharm.D. []  Venetia Gully, [address].D., BCPS AQ-ID []  Garrel Crews, Pharm.D., BCPS []  Almarie Lunger, Pharm.D., BCPS []  [place], [place].D., BCPS, AAHIVP []  Rosaline Bihari, Pharm.D., BCPS, AAHIVP []  Massie Rigg, PharmD []  Jodie Rower, PharmD, BCPS  Positive urine culture  []  Patient discharged without antimicrobial prescription and treatment is now indicated [x]  Organism is resistant to prescribed ED discharge antimicrobial []  Patient with positive blood cultures  Plan: Stop Augmentin  Start Ciprofloxacin  250mg  BID x 5 days per [name], MD  Unable to contact patient , letter will be sent to address on file  Robert Fowler 09/18/2024, 10:51 AM

## 2024-09-18 NOTE — Progress Notes (Signed)
 ED Antimicrobial Stewardship Positive Culture Follow Up   Robert Fowler is an 73 y.o. male who presented to [facility] on 09/14/2024 with a chief complaint of  Chief Complaint  Patient presents with   Dysuria    Recent Results (from the past 720 hours)  Urine Culture     Status: Abnormal   Collection Time: 09/14/24  8:20 PM   Specimen: Urine, Clean Catch  Result Value Ref Range Status   Specimen Description   Final    URINE, CLEAN CATCH Performed at [facility], [address], [place], [place] [address]    Special Requests   Final    NONE Performed at [facility], [address], [place], [place] [address]    Culture >=100,000 COLONIES/mL PSEUDOMONAS AERUGINOSA (A)  Final   Report Status 09/17/2024 FINAL  Final   Organism ID, Bacteria PSEUDOMONAS AERUGINOSA (A)  Final      Susceptibility   Pseudomonas aeruginosa - MIC*    MEROPENEM <=0.25 SENSITIVE Sensitive     CIPROFLOXACIN  0.12 SENSITIVE Sensitive     IMIPENEM 2 SENSITIVE Sensitive     PIP/TAZO Value in next row Sensitive      8 SENSITIVEThis is a modified FDA-approved test that has been validated and its performance characteristics determined by the reporting laboratory.  This laboratory is certified under the Clinical Laboratory Improvement Amendments CLIA as qualified to perform high complexity clinical laboratory testing.    CEFEPIME Value in next row Sensitive      8 SENSITIVEThis is a modified FDA-approved test that has been validated and its performance characteristics determined by the reporting laboratory.  This laboratory is certified under the Clinical Laboratory Improvement Amendments CLIA as qualified to perform high complexity clinical laboratory testing.    CEFTAZIDIME/AVIBACTAM Value in next row Sensitive      8 SENSITIVEThis is a modified FDA-approved test that has been validated and its performance characteristics determined by the reporting laboratory.  This  laboratory is certified under the Clinical Laboratory Improvement Amendments CLIA as qualified to perform high complexity clinical laboratory testing.    CEFTOLOZANE/TAZOBACTAM Value in next row Sensitive      8 SENSITIVEThis is a modified FDA-approved test that has been validated and its performance characteristics determined by the reporting laboratory.  This laboratory is certified under the Clinical Laboratory Improvement Amendments CLIA as qualified to perform high complexity clinical laboratory testing.    TOBRAMYCIN Value in next row Sensitive      8 SENSITIVEThis is a modified FDA-approved test that has been validated and its performance characteristics determined by the reporting laboratory.  This laboratory is certified under the Clinical Laboratory Improvement Amendments CLIA as qualified to perform high complexity clinical laboratory testing.    CEFTAZIDIME Value in next row Sensitive      8 SENSITIVEThis is a modified FDA-approved test that has been validated and its performance characteristics determined by the reporting laboratory.  This laboratory is certified under the Clinical Laboratory Improvement Amendments CLIA as qualified to perform high complexity clinical laboratory testing.    * >=100,000 COLONIES/mL PSEUDOMONAS AERUGINOSA    [x]  Treated with augmentin , organism resistant to prescribed antimicrobial []  Patient discharged originally without antimicrobial agent and treatment is now indicated  New antibiotic prescription: Stop augmentin . Start Ciprofloxacin  250 mg BID X 5 days   ED Provider: [name], MD    Damien Quiet, PharmD, BCPS, BCIDP Infectious Diseases Clinical Pharmacist Phone: [phone] 09/18/2024, 10:26 AM

## 2024-09-27 ENCOUNTER — Ambulatory Visit: Payer: Self-pay

## 2024-09-27 NOTE — Telephone Encounter (Signed)
 Spoke to pt. He said he was down on the floor on his knees for awhile. Said he had a small sore on it. He is afraid it might be getting infected. I gave him info on [facility]. He is going to go there.

## 2024-09-27 NOTE — Telephone Encounter (Signed)
 FYI Only or Action Required?: FYI only for provider: appointment scheduled on 09/30/2024 at 2 PM.  Patient was last seen in primary care on 08/27/2024 by Rilla Baller, MD.  Called Nurse Triage reporting Joint Swelling.  Symptoms began yesterday.  Interventions attempted: Rest, hydration, or home remedies.  Symptoms are: unchanged.  Triage Disposition: See PCP When Office is Open (Within 3 Days)  Patient/caregiver understands and will follow disposition?: Yes  Copied from CRM #[id number]. Topic: Clinical - Red Word Triage >> Sep 27, 2024  8:20 AM Robinson DEL wrote: Red Word that prompted transfer to Nurse Triage: Swollen knee that got infected and swelled up, some pain and feels warm to the touch. Reason for Disposition  MILD or MODERATE swelling (e.g., can't move joint normally, can't do usual activities) (Exceptions: Itchy, localized swelling; swelling is chronic.)  Answer Assessment - Initial Assessment Questions Patient reports he was on the floor yesterday on his knees and reports left knee is swollen and painful. Patient states he has a scab on his knee that he thinks the scab became infected.   1. LOCATION: Where is the swelling located?  (e.g., left, right, both knees)     Left knee 2. ONSET: When did the swelling start? Does it come and go, or is it there all the time?     Started overnight 3. SWELLING: How bad is the swelling? Or, How large is it? (e.g., mild, moderate, severe; size of localized swelling)      Moderate to severe 4. PAIN: Is there any pain? If Yes, ask: How bad is it? (Scale 0-10; or none, mild, moderate, severe)     2 out of 10 5. SETTING: Has there been any recent work, exercise or other activity that involved that part of the body?      Was doing housework yesterday and was on his knees 6. AGGRAVATING FACTORS: What makes the knee swelling worse? (e.g., walking, climbing stairs, running)     Knee swelling is worse when patient's leg is  dependent 7. ASSOCIATED SYMPTOMS: Is there any pain or redness?     pain 8. OTHER SYMPTOMS: Do you have any other symptoms? (e.g., calf pain, chest pain, difficulty breathing, fever)     no  Protocols used: Knee Swelling-A-AH

## 2024-09-27 NOTE — Telephone Encounter (Signed)
 Noted! Thank you

## 2024-09-30 ENCOUNTER — Ambulatory Visit: Admitting: Family Medicine

## 2024-09-30 ENCOUNTER — Encounter: Payer: Self-pay | Admitting: Family Medicine

## 2024-09-30 VITALS — BP 118/72 | HR 81 | Temp 98.4°F | Ht 68.5 in | Wt 194.0 lb

## 2024-09-30 DIAGNOSIS — Z23 Encounter for immunization: Secondary | ICD-10-CM

## 2024-09-30 DIAGNOSIS — L02416 Cutaneous abscess of left lower limb: Secondary | ICD-10-CM | POA: Diagnosis not present

## 2024-09-30 MED ORDER — DOXYCYCLINE HYCLATE 100 MG PO TABS: 100.0000 mg | ORAL_TABLET | Freq: Two times a day (BID) | ORAL | 0 refills | Status: DC

## 2024-09-30 NOTE — Assessment & Plan Note (Addendum)
 Exam consistent with furuncle to left anterior knee, doubt septic bursitis or arthritis given preserved ROM.  Already on keflex Suspect MRSA, however he also does have h/o pseudomonas UTI - wound culture sent. Will add doxycycline  with photosensitivity precautions.  Discussed wound care at home  Rec regular leg elevation and warm compress use.  RTC 2d wound check.  Red flags to seek ER or ortho [facility] evaluation reviewed.  Pt and sister agree with plan.

## 2024-09-30 NOTE — Progress Notes (Signed)
 " Ph: [phone] Fax: [phone]   Patient ID: Robert Fowler, male    DOB: [birth year], 74 y.o.   MRN: [id number]  This visit was conducted in person.  BP 118/72 (Cuff Size: Normal)   Pulse 81   Temp 98.4 F (36.9 C) (Oral)   Ht 5' 8.5 (1.74 m)   Wt 194 lb (88 kg)   SpO2 95%   BMI 29.07 kg/m    CC: left knee pain /infection Subjective:   HPI: Robert Fowler is a 74 y.o. male presenting on 09/30/2024 for Acute Visit (Patient reports he was on the floor 09/26/24 on his knees and reports left knee is swollen and painful. Patient states he has a scab on his knee that he thinks the scab became infected. //Pt's left knee and below is swollen , knee is red and has wound that is draining/Pt having night sweats//Pt reports he stuck a straight pin in his knee on 1/2 to try to drain wound, think it may of made it worse, tdap is out of date//Pt's little sister, Landry is in room with pt/)   DOI: 09/27/2023  Thursday morning while cleaning house he was on his knees a good portion of the morning - he already had a scab present. He has had drainage from the wound. He stuck a pin to try and get infection out.   L knee swelling started on 09/27/2023. He's been treating with hydrogen peroxide   Having night sweats and chills.  No abd pain nausea, diarrhea.  No documented fever.   Was seen at [facility] on 09/28/2023. Skin cleaned with H2O2.  Stared on keflex 500mg  QID for 7 days.  Told to f/u PRN.   Previously had completed ciprofloxacin  1 wk course on 09/17/2024 for UTI  Tdap 10/2013.      Relevant past medical, surgical, family and social history reviewed and updated as indicated. Interim medical history since our last visit reviewed. Allergies and medications reviewed and updated. Outpatient Medications Prior to Visit  Medication Sig Dispense Refill   acetaminophen  (TYLENOL ) 500 MG tablet Take 1,000 mg by mouth at bedtime.     amLODipine  (NORVASC ) 2.5 MG tablet Take 1 tablet (2.5  mg total) by mouth daily. 90 tablet 3   aspirin  81 MG tablet Take 81 mg by mouth daily.     B Complex Vitamins (VITAMIN B COMPLEX PO) Take 1 capsule by mouth every other day.     benztropine  (COGENTIN ) 0.5 MG tablet Take 0.5 mg by mouth at bedtime.     Calcium  Citrate 333 MG TABS Take 1 tablet by mouth daily. (Patient taking differently: Take 333 mg by mouth daily.)     cephALEXin (KEFLEX) 500 MG capsule Take 500 mg by mouth every 6 (six) hours.     Cholecalciferol  (VITAMIN D3 PO) Take 1 capsule by mouth in the morning.     divalproex  (DEPAKOTE  ER) 250 MG 24 hr tablet Take 750 mg by mouth at bedtime.     FLUoxetine  (PROZAC ) 20 MG capsule Take 20 mg by mouth in the morning.     hydrOXYzine  (ATARAX /VISTARIL ) 25 MG tablet Take 25 mg by mouth 2 (two) times daily as needed for anxiety.     OLANZapine  zydis (ZYPREXA ) 10 MG disintegrating tablet Take 10 mg by mouth See admin instructions. Dissolve 10 mg in the mouth at bedtime     OLANZapine  zydis (ZYPREXA ) 5 MG disintegrating tablet Take 5 mg by mouth See admin instructions. Dissolve 5 mg  in the mouth in the morning     tadalafil  (CIALIS ) 5 MG tablet Take 1 tablet (5 mg total) by mouth daily. 10 tablet 0   amoxicillin -clavulanate (AUGMENTIN ) 875-125 MG tablet Take 1 tablet by mouth every 12 (twelve) hours. 20 tablet 0   tamsulosin  (FLOMAX ) 0.4 MG CAPS capsule Take 1 capsule (0.4 mg total) by mouth daily. (Patient not taking: Reported on 09/30/2024) 90 capsule 3   No facility-administered medications prior to visit.     Per HPI unless specifically indicated in ROS section below Review of Systems  Objective:  BP 118/72 (Cuff Size: Normal)   Pulse 81   Temp 98.4 F (36.9 C) (Oral)   Ht 5' 8.5 (1.74 m)   Wt 194 lb (88 kg)   SpO2 95%   BMI 29.07 kg/m   Wt Readings from Last 3 Encounters:  09/30/24 194 lb (88 kg)  08/27/24 190 lb 12.8 oz (86.5 kg)  07/11/24 194 lb (88 kg)      Physical Exam Vitals and nursing note reviewed.   Constitutional:      Appearance: Normal appearance. He is not ill-appearing.  Musculoskeletal:        General: Swelling and tenderness present.     Right lower leg: No edema.     Left lower leg: Edema present.       Legs:     Comments:  FROM at left knee without stiffness No significant pain to palpation at anterior knee bursae  Skin:    General: Skin is warm.     Findings: Erythema and wound present.     Comments:  Carbuncle to lateral anterior left knee with surrounding erythema, discharge - wound culture collected from drainage expressed from wound Wound cleaned with alcohol  Area of erythema delineated   Neurological:     Mental Status: He is alert.  Psychiatric:        Mood and Affect: Mood normal.        Behavior: Behavior normal.        Assessment & Plan:   Problem List Items Addressed This Visit     Cutaneous abscess of left knee - Primary   Exam consistent with furuncle to left anterior knee, doubt septic bursitis or arthritis given preserved ROM.  Already on keflex Suspect MRSA, however he also does have h/o pseudomonas UTI - wound culture sent. Will add doxycycline  with photosensitivity precautions.  Discussed wound care at home  Rec regular leg elevation and warm compress use.  RTC 2d wound check.  Red flags to seek ER or ortho [facility] evaluation reviewed.  Pt and sister agree with plan.       Relevant Orders   WOUND CULTURE   Other Visit Diagnoses       Need for Td vaccine       Relevant Orders   Td vaccine greater than or equal to 7yo preservative free IM        Meds ordered this encounter  Medications   doxycycline  (VIBRA -TABS) 100 MG tablet    Sig: Take 1 tablet (100 mg total) by mouth 2 (two) times daily.    Dispense:  20 tablet    Refill:  0    Orders Placed This Encounter  Procedures   WOUND CULTURE    Source:   left anterior knee cellulitis/abscess   Td vaccine greater than or equal to 7yo preservative free IM    Patient  Instructions  Tetanus shot today  Finish keflex antibiotic.  I  worry you have a more resistant bacteria - add on doxycycline  antibiotic twice daily for 10 days  If developing joint stiffness, worsening pain or fever >101 then return to [facility] or go to ER.  Update us  if not improving with treatment.   Follow up plan: Return in about 2 days (around 10/02/2024), or if symptoms worsen or fail to improve, for follow up visit.  Anton Blas, MD   "

## 2024-09-30 NOTE — Patient Instructions (Addendum)
 Tetanus shot today  Finish keflex antibiotic.  I worry you have a more resistant bacteria - add on doxycycline  antibiotic twice daily for 10 days  If developing joint stiffness, worsening pain or fever >101 then return to [facility] or go to ER.  Update us  if not improving with treatment.

## 2024-10-02 ENCOUNTER — Ambulatory Visit: Admitting: Family Medicine

## 2024-10-02 ENCOUNTER — Encounter: Payer: Self-pay | Admitting: Family Medicine

## 2024-10-02 VITALS — BP 126/80 | HR 75 | Temp 98.2°F | Ht 68.5 in | Wt 191.0 lb

## 2024-10-02 DIAGNOSIS — L929 Granulomatous disorder of the skin and subcutaneous tissue, unspecified: Secondary | ICD-10-CM

## 2024-10-02 DIAGNOSIS — L02416 Cutaneous abscess of left lower limb: Secondary | ICD-10-CM | POA: Diagnosis not present

## 2024-10-02 NOTE — Assessment & Plan Note (Addendum)
 Erythema is improving, persistent FROM at knee, however given enlarging lesion (suspect due to hypergranulation), did recommend return to ortho [facility] tomorrow for re evaluation r/o bursal involvement.  Wound Cx preliminarily growing staph aureus.  Finish doxycycline  + keflex course.  Seek ER care if new symptoms like fever, nausea, streaking redness develop.  He will let me know after ortho appt plan of care.

## 2024-10-02 NOTE — Progress Notes (Signed)
 " Ph: [phone] Fax: [phone]   Patient ID: Robert Fowler, male    DOB: 12/10/1950, 74 y.o.   MRN: [id number]  This visit was conducted in person.  BP 126/80 (Cuff Size: Normal)   Pulse 75   Temp 98.2 F (36.8 C) (Oral)   Ht 5' 8.5 (1.74 m)   Wt 191 lb (86.6 kg)   SpO2 95%   BMI 28.62 kg/m    CC: wound check  Subjective:   HPI: Vin Yonke is a 74 y.o. male presenting on 10/02/2024 for Wound Check (Pt states he feels he is doing better, able to move his leg better/States knee is itchy/Leg and knee are still swollen, redness has not spread outside of margins/Wound still draining //Pt changes his dressing multiple times a day whenever he feels it may be dirty and rinses with soap and water before re-dressing//Also wanted you to know his hemorrhoids came back)   See prior note for details.  Seen Monday for knee cellulitis /abscess worsening despite keflex course prescribed by ortho [facility] so wound culture sent and started on doxycycline  antibiotic to cover MRSA. Culture preliminarily growing few staph aureus.  He notes wound erythema is improving. Preserved knee ROM.  Continues draining some.   Tolerating abx well without fevers/chills, nausea.      Relevant past medical, surgical, family and social history reviewed and updated as indicated. Interim medical history since our last visit reviewed. Allergies and medications reviewed and updated. Outpatient Medications Prior to Visit  Medication Sig Dispense Refill   acetaminophen  (TYLENOL ) 500 MG tablet Take 1,000 mg by mouth at bedtime.     amLODipine  (NORVASC ) 2.5 MG tablet Take 1 tablet (2.5 mg total) by mouth daily. 90 tablet 3   aspirin  81 MG tablet Take 81 mg by mouth daily.     B Complex Vitamins (VITAMIN B COMPLEX PO) Take 1 capsule by mouth every other day.     benztropine  (COGENTIN ) 0.5 MG tablet Take 0.5 mg by mouth at bedtime.     Calcium  Citrate 333 MG TABS Take 1 tablet by mouth daily. (Patient taking  differently: Take 333 mg by mouth daily.)     cephALEXin (KEFLEX) 500 MG capsule Take 500 mg by mouth every 6 (six) hours.     Cholecalciferol  (VITAMIN D3 PO) Take 1 capsule by mouth in the morning.     divalproex  (DEPAKOTE  ER) 250 MG 24 hr tablet Take 750 mg by mouth at bedtime.     doxycycline  (VIBRA -TABS) 100 MG tablet Take 1 tablet (100 mg total) by mouth 2 (two) times daily. 20 tablet 0   FLUoxetine  (PROZAC ) 20 MG capsule Take 20 mg by mouth in the morning.     hydrOXYzine  (ATARAX /VISTARIL ) 25 MG tablet Take 25 mg by mouth 2 (two) times daily as needed for anxiety.     OLANZapine  zydis (ZYPREXA ) 10 MG disintegrating tablet Take 10 mg by mouth See admin instructions. Dissolve 10 mg in the mouth at bedtime     OLANZapine  zydis (ZYPREXA ) 5 MG disintegrating tablet Take 5 mg by mouth See admin instructions. Dissolve 5 mg in the mouth in the morning     tadalafil  (CIALIS ) 5 MG tablet Take 1 tablet (5 mg total) by mouth daily. 10 tablet 0   tamsulosin  (FLOMAX ) 0.4 MG CAPS capsule Take 1 capsule (0.4 mg total) by mouth daily. (Patient not taking: Reported on 09/30/2024) 90 capsule 3   No facility-administered medications prior to visit.  Per HPI unless specifically indicated in ROS section below Review of Systems  Objective:  BP 126/80 (Cuff Size: Normal)   Pulse 75   Temp 98.2 F (36.8 C) (Oral)   Ht 5' 8.5 (1.74 m)   Wt 191 lb (86.6 kg)   SpO2 95%   BMI 28.62 kg/m   Wt Readings from Last 3 Encounters:  10/02/24 191 lb (86.6 kg)  09/30/24 194 lb (88 kg)  08/27/24 190 lb 12.8 oz (86.5 kg)      Physical Exam Vitals and nursing note reviewed.  Constitutional:      Appearance: Normal appearance. He is not ill-appearing.  Musculoskeletal:        General: Swelling present. Normal range of motion.     Right lower leg: No edema.     Left lower leg: Edema (1+ pitting) present.     Comments:  FROM bilateral knees   Skin:    General: Skin is warm and dry.     Findings: Erythema  (improving without significant extension past previously delineated area) and lesion present.         Comments: Persistent lesion with new hypergranulation (proud skin) to left lateral knee wound as per below  Neurological:     Mental Status: He is alert.  Psychiatric:        Mood and Affect: Mood normal.        Behavior: Behavior normal.      Wound cleaned with betadine and pressure applied without much drainage expressed.     Results for orders placed or performed in visit on 09/30/24  Aerobic culture   Collection Time: 09/30/24  2:45 PM  Result Value Ref Range   MICRO NUMBER: [id number]    SPECIMEN QUALITY: Adequate    SOURCE: LEFT ANTERIOR KNEE    STATUS: PRELIMINARY    AER ISOLATE 1: Staphylococcus aureus (A)    Assessment & Plan:   Problem List Items Addressed This Visit     Cutaneous abscess of left knee - Primary   Erythema is improving, persistent FROM at knee, however given enlarging lesion (suspect due to hypergranulation), did recommend return to ortho [facility] tomorrow for re evaluation r/o bursal involvement.  Wound Cx preliminarily growing staph aureus.  Finish doxycycline  + keflex course.  Seek ER care if new symptoms like fever, nausea, streaking redness develop.  He will let me know after ortho appt plan of care.       Other Visit Diagnoses       Hypergranulation            No orders of the defined types were placed in this encounter.   No orders of the defined types were placed in this encounter.   Patient Instructions  Fleurette both antibiotics - doxycycline  and keflex Return to orthopedic urgent care tomorrow morning for recheck - walk in Wound culture preliminary growing staph aureus but sensitivities not yet available.   Follow up plan: Return if symptoms worsen or fail to improve.  Anton Blas, MD   "

## 2024-10-02 NOTE — Patient Instructions (Addendum)
 Finish both antibiotics - doxycycline  and keflex Return to orthopedic urgent care tomorrow morning for recheck - walk in Wound culture preliminary growing staph aureus but sensitivities not yet available.

## 2024-10-03 ENCOUNTER — Ambulatory Visit (HOSPITAL_COMMUNITY): Admitting: Anesthesiology

## 2024-10-03 ENCOUNTER — Other Ambulatory Visit: Payer: Self-pay | Admitting: Orthopedic Surgery

## 2024-10-03 ENCOUNTER — Encounter (HOSPITAL_COMMUNITY): Payer: Self-pay | Admitting: Orthopedic Surgery

## 2024-10-03 ENCOUNTER — Ambulatory Visit (HOSPITAL_COMMUNITY)
Admission: RE | Admit: 2024-10-03 | Discharge: 2024-10-03 | Disposition: A | Discharge status: Home / Self Care | Attending: Orthopedic Surgery | Admitting: Orthopedic Surgery

## 2024-10-03 ENCOUNTER — Ambulatory Visit: Payer: Self-pay | Admitting: Family Medicine

## 2024-10-03 ENCOUNTER — Encounter (HOSPITAL_COMMUNITY)
Admission: RE | Disposition: A | Discharge status: Home / Self Care | Payer: Self-pay | Source: Home / Self Care | Attending: Orthopedic Surgery

## 2024-10-03 DIAGNOSIS — L02416 Cutaneous abscess of left lower limb: Secondary | ICD-10-CM

## 2024-10-03 DIAGNOSIS — F209 Schizophrenia, unspecified: Secondary | ICD-10-CM | POA: Insufficient documentation

## 2024-10-03 DIAGNOSIS — Z87891 Personal history of nicotine dependence: Secondary | ICD-10-CM | POA: Diagnosis not present

## 2024-10-03 DIAGNOSIS — I10 Essential (primary) hypertension: Secondary | ICD-10-CM | POA: Diagnosis not present

## 2024-10-03 DIAGNOSIS — Z79899 Other long term (current) drug therapy: Secondary | ICD-10-CM | POA: Insufficient documentation

## 2024-10-03 DIAGNOSIS — L0291 Cutaneous abscess, unspecified: Secondary | ICD-10-CM | POA: Diagnosis present

## 2024-10-03 DIAGNOSIS — F319 Bipolar disorder, unspecified: Secondary | ICD-10-CM | POA: Diagnosis not present

## 2024-10-03 HISTORY — PX: IRRIGATION AND DEBRIDEMENT KNEE: SHX5185

## 2024-10-03 LAB — AEROBIC CULTURE
MICRO NUMBER:: 17425808
SPECIMEN QUALITY:: ADEQUATE

## 2024-10-03 MED ORDER — OXYCODONE HCL 5 MG PO TABS: 5.0000 mg | ORAL_TABLET | Freq: Four times a day (QID) | ORAL | 0 refills | Status: AC | PRN

## 2024-10-03 MED ORDER — FENTANYL CITRATE (PF) 50 MCG/ML IJ SOSY: 25.0000 ug | PREFILLED_SYRINGE | INTRAMUSCULAR | Status: DC | PRN

## 2024-10-03 MED ORDER — PROPOFOL 10 MG/ML IV BOLUS
INTRAVENOUS | Status: AC
  Filled 2024-10-03: qty 20

## 2024-10-03 MED ORDER — FENTANYL CITRATE (PF) 100 MCG/2ML IJ SOLN
INTRAMUSCULAR | Status: AC
  Filled 2024-10-03: qty 2

## 2024-10-03 MED ORDER — AMISULPRIDE (ANTIEMETIC) 5 MG/2ML IV SOLN: 10.0000 mg | Freq: Once | INTRAVENOUS | Status: DC | PRN

## 2024-10-03 MED ORDER — AMOXICILLIN-POT CLAVULANATE 875-125 MG PO TABS: 1.0000 | ORAL_TABLET | Freq: Two times a day (BID) | ORAL | 0 refills | Status: DC

## 2024-10-03 MED ORDER — LACTATED RINGERS IV SOLN: INTRAVENOUS | Status: DC | PRN

## 2024-10-03 MED ORDER — CEFAZOLIN SODIUM-DEXTROSE 2-4 GM/100ML-% IV SOLN
2.0000 g | INTRAVENOUS | Status: AC
  Administered 2024-10-03: 2 g via INTRAVENOUS
  Filled 2024-10-03: qty 100

## 2024-10-03 MED ORDER — ACETAMINOPHEN 10 MG/ML IV SOLN: 1000.0000 mg | Freq: Once | INTRAVENOUS | Status: DC | PRN

## 2024-10-03 MED ORDER — ONDANSETRON HCL 4 MG/2ML IJ SOLN: 4.0000 mg | Freq: Once | INTRAMUSCULAR | Status: DC | PRN

## 2024-10-03 MED ORDER — DEXAMETHASONE SODIUM PHOSPHATE 4 MG/ML IJ SOLN
INTRAMUSCULAR | Status: DC | PRN
  Administered 2024-10-03: 5 mg via INTRAVENOUS

## 2024-10-03 MED ORDER — FENTANYL CITRATE (PF) 100 MCG/2ML IJ SOLN
INTRAMUSCULAR | Status: DC | PRN
  Administered 2024-10-03 (×2): 50 ug via INTRAVENOUS

## 2024-10-03 MED ORDER — ONDANSETRON HCL 4 MG/2ML IJ SOLN
INTRAMUSCULAR | Status: DC | PRN
  Administered 2024-10-03: 4 mg via INTRAVENOUS

## 2024-10-03 MED ORDER — CLINDAMYCIN HCL 300 MG PO CAPS: 300.0000 mg | ORAL_CAPSULE | Freq: Three times a day (TID) | ORAL | 0 refills | Status: DC

## 2024-10-03 MED ORDER — LIDOCAINE HCL (CARDIAC) PF 100 MG/5ML IV SOSY
PREFILLED_SYRINGE | INTRAVENOUS | Status: DC | PRN
  Administered 2024-10-03: 60 mg via INTRATRACHEAL

## 2024-10-03 MED ORDER — PROPOFOL 10 MG/ML IV BOLUS
INTRAVENOUS | Status: DC | PRN
  Administered 2024-10-03: 200 mg via INTRAVENOUS

## 2024-10-03 NOTE — Transfer of Care (Signed)
 Immediate Anesthesia Transfer of Care Note  Patient: Robert Fowler  Procedure(s) Performed: IRRIGATION AND DEBRIDEMENT KNEE (Left: Knee)  Patient Location: PACU  Anesthesia Type:General  Level of Consciousness: awake and alert   Airway & Oxygen Therapy: Patient Spontanous Breathing and Patient connected to nasal cannula oxygen  Post-op Assessment: Report given to RN and Post -op Vital signs reviewed and stable  Post vital signs: Reviewed and stable  Last Vitals:  Vitals Value Taken Time  BP 133/84 10/03/24 16:25  Temp    Pulse 83 10/03/24 16:27  Resp 15 10/03/24 16:27  SpO2 100 % 10/03/24 16:27  Vitals shown include unfiled device data.  Last Pain:  Vitals:   10/03/24 1239  TempSrc:   PainSc: 0-No pain         Complications: No notable events documented.

## 2024-10-03 NOTE — Anesthesia Preprocedure Evaluation (Addendum)
"                                    Anesthesia Evaluation  Patient identified by MRN, date of birth, ID band Patient awake    Reviewed: Allergy & Precautions, NPO status , Patient's Chart, lab work & pertinent test results  Airway Mallampati: II  TM Distance: >3 FB Neck ROM: Full    Dental  (+) Upper Dentures, Lower Dentures   Pulmonary former smoker   Pulmonary exam normal        Cardiovascular hypertension, Pt. on medications Normal cardiovascular exam     Neuro/Psych  PSYCHIATRIC DISORDERS   Bipolar Disorder Schizophrenia     GI/Hepatic negative GI ROS, Neg liver ROS,,,  Endo/Other  negative endocrine ROS    Renal/GU negative Renal ROS     Musculoskeletal   Abdominal   Peds  Hematology negative hematology ROS (+)   Anesthesia Other Findings CELLULITIS OF LEFT KNEE  Reproductive/Obstetrics                              Anesthesia Physical Anesthesia Plan  ASA: 2  Anesthesia Plan: General   Post-op Pain Management:    Induction: Intravenous  PONV Risk Score and Plan: 2 and Ondansetron , Dexamethasone  and Treatment may vary due to age or medical condition  Airway Management Planned: LMA  Additional Equipment:   Intra-op Plan:   Post-operative Plan: Extubation in OR  Informed Consent: I have reviewed the patients History and Physical, chart, labs and discussed the procedure including the risks, benefits and alternatives for the proposed anesthesia with the patient or authorized representative who has indicated his/her understanding and acceptance.     Dental advisory given  Plan Discussed with: CRNA  Anesthesia Plan Comments:          Anesthesia Quick Evaluation  "

## 2024-10-03 NOTE — Telephone Encounter (Addendum)
 Unable to reach pt at this time, left voicemail instructing pt to call back for lab results as soon as possible. Chart encounter shows pt is currently being treated at [facility] by Sherida Cain, MD   Please provide message from provider/office when call is returned from patient.

## 2024-10-03 NOTE — Discharge Instructions (Addendum)
 Robert Mon, MD [facility] [address] [place], [place] [address] [phone]   POST-OPERATIVE INSTRUCTIONS - Knee Arthroscopy  WOUND CARE - Keep your dressing clean and dry until follow up - An ACE wrap may be used to control swelling, do not wrap this too tight.  If the initial ACE wrap feels too tight you may loosen it. - There may be a small amount of fluid/bleeding leaking at the surgical site.  - This is normal; the knee is filled with fluid during the procedure and can leak for 24-48hrs after surgery. You may change/reinforce the bandage as needed.  - Use the Cryocuff or Ice as often as possible for the first 7 days, then as needed for pain relief. Always keep a towel, ACE wrap or other barrier between the cooling unit and your skin.  - Do not go swimming in the pool or ocean until 4 weeks after surgery or when otherwise instructed.  Keep dry incisions as dry as possible.   BRACE/AMBULATION  -          You will not need a brace after this procedure.   - You may use crutches initially to help you weight bear, but this is not required - You can put full weight on your operative leg as you feel comfortable  REGIONAL ANESTHESIA (NERVE BLOCKS) The anesthesia team may have performed a nerve block for you this is a great tool used to minimize pain.   The block may start wearing off overnight (between 8-24 hours postop) When the block wears off, your pain may go from nearly zero to the pain you would have had postop without the block. This is an abrupt transition but nothing dangerous is happening.   This can be a challenging period but utilize your as needed pain medications to try and manage this period. We suggest you use the pain medication the first night prior to going to bed, to ease this transition.  You may take an extra dose of narcotic when this happens if needed   POST-OP MEDICATIONS- Multimodal approach to pain control In general your pain will be controlled with a  combination of substances.  Prescriptions unless otherwise discussed are electronically sent to your pharmacy.  This is a carefully made plan we use to minimize narcotic use.     Ibuprofen  or Naproxen - Anti-inflammatory medication taken on a scheduled basis Acetaminophen  - Non-narcotic pain medicine taken on a scheduled basis  Oxycodone  - This is a strong narcotic, to be used only on an as needed basis for SEVERE pain. Other: Augmentin : An antibiotic for your infection to take twice a day Clindamycin : An antibiotic to take for infection 3 times a day   FOLLOW-UP   Please call the office to schedule a follow-up appointment for your incision check, 7-10 days post-operatively.   IF YOU HAVE ANY QUESTIONS, PLEASE FEEL FREE TO CALL OUR OFFICE.   HELPFUL INFORMATION   Keep your leg elevated to decrease swelling, which will then in turn decrease your pain. I would elevate the foot of your bed by putting a couple of couch pillows between your mattress and box spring. I would not keep pillow directly under your ankle.  - Do not sleep with a pillow behind your knee even if it is more comfortable as this may make it harder to get your knee fully straight long term.   There will be MORE swelling on days 1-3 than there is on the day of surgery.  This  also is normal. The swelling will decrease with the anti-inflammatory medication, ice and keeping it elevated. The swelling will make it more difficult to bend your knee. As the swelling goes down your motion will become easier   You may develop swelling and bruising that extends from your knee down to your calf and perhaps even to your foot over the next week. Do not be alarmed. This too is normal, and it is due to gravity   There may be some numbness adjacent to the incision site. This may last for 6-12 months or longer in some patients and is expected.   You may return to sedentary work/school in the next couple of days when you feel up to it.  You will need to keep your leg elevated as much as possible    You should wean off your narcotic medicines as soon as you are able.  Most patients will be off narcotics before their first postop appointment.    We suggest you use the pain medication the first night prior to going to bed, in order to ease any pain when the anesthesia wears off. You should avoid taking pain medications on an empty stomach as it will make you nauseous.   Do not drink alcoholic beverages or take illicit drugs when taking pain medications.   It is against the law to drive while taking narcotics. You cannot drive if your Right leg is in brace locked in extension.   Pain medication may make you constipated.  Below are a few solutions to try in this order:  o Decrease the amount of pain medication if you aren't having pain.  o Drink lots of decaffeinated fluids.  o Drink prune juice and/or eat dried prunes   o If the first 3 don't work start with additional solutions  o Take Colace - an over-the-counter stool softener  o Take Senokot - an over-the-counter laxative  o Take Miralax - a stronger over-the-counter laxative

## 2024-10-03 NOTE — H&P (Signed)
 Robert Fowler is an 74 y.o. male.   Chief Complaint: Left leg infection HPI: Robert Fowler is an 74 y.o. male with a wound on anterior left knee. Seen by his primary care and diagnoses with abscess. He has not improved with oral antibiotics. He continues to have worsening pain and drainage from the sight. Culture of the wound grew Staph.  Past Medical History:  Diagnosis Date   Arthritis    BENIGN PROSTATIC HYPERTROPHY 07/18/2007   Bipolar disorder (HCC)    Habitual alcohol use    Lesion of oral mucosa 03/21/2018   Biopsy 03/2018 [facility]) - INFLAMMATORY FIBROUS HYPERPLASIA CONSISTENT WITH DENTURE INJURY (EPULIS FISSURATUM).    OSTEOPOROSIS 09/04/2007   DEXA -2.8 lumbar (03/2015)   Pes planus    Prediabetes 2014   Schizoaffective disorder (HCC)    h/o paranoid schizophrenia   Wears dentures    full upper and lower    Past Surgical History:  Procedure Laterality Date   COLONOSCOPY  07/2023   several TAs including 1.2cm one, redundant colon, rpt 3 yrs ([facility])   INGUINAL HERNIA REPAIR Right 08/26/2014   Dr Nalani   IR ANGIOGRAM PELVIS SELECTIVE OR SUPRASELECTIVE  07/11/2024   IR ANGIOGRAM SELECTIVE EACH ADDITIONAL VESSEL  07/11/2024   IR ANGIOGRAM SELECTIVE EACH ADDITIONAL VESSEL  07/11/2024   IR EMBO ARTERIAL NOT HEMORR HEMANG INC GUIDE ROADMAPPING  07/11/2024   IR RADIOLOGIST EVAL & MGMT  06/03/2024   IR RADIOLOGIST EVAL & MGMT  07/26/2024   IR US  GUIDE VASC ACCESS LEFT  07/11/2024   IR US  GUIDE VASC ACCESS LEFT  07/11/2024   IR US  GUIDE VASC ACCESS RIGHT  07/11/2024   MASS EXCISION N/A 04/13/2018   Procedure: EXCISION LOWER LIP ORAL  MASS;  Surgeon: Herminio Miu, MD;  Location: [facility];  Service: ENT;  Laterality: N/A;    Family History  Problem Relation Age of Onset   COPD Mother        smoker   Dementia Mother    Alcohol abuse Father    Alcohol abuse Maternal Grandmother    CAD Neg Hx    Stroke Neg Hx    Cancer Neg Hx    Diabetes Neg Hx     Hypertension Neg Hx    Social History:  reports that he quit smoking about 18 years ago. His smoking use included cigarettes. He has never used smokeless tobacco. He reports that he does not currently use alcohol. He reports current drug use. Drug: Marijuana.  Allergies: Allergies[1]  Medications Prior to Admission  Medication Sig Dispense Refill   acetaminophen  (TYLENOL ) 500 MG tablet Take 1,000 mg by mouth at bedtime.     amLODipine  (NORVASC ) 2.5 MG tablet Take 1 tablet (2.5 mg total) by mouth daily. 90 tablet 3   aspirin  81 MG tablet Take 81 mg by mouth daily.     B Complex Vitamins (VITAMIN B COMPLEX PO) Take 1 capsule by mouth every other day.     benztropine  (COGENTIN ) 0.5 MG tablet Take 0.5 mg by mouth at bedtime.     divalproex  (DEPAKOTE  ER) 250 MG 24 hr tablet Take 750 mg by mouth at bedtime.     FLUoxetine  (PROZAC ) 20 MG capsule Take 20 mg by mouth in the morning.     hydrOXYzine  (ATARAX /VISTARIL ) 25 MG tablet Take 25 mg by mouth 2 (two) times daily as needed for anxiety.     OLANZapine  zydis (ZYPREXA ) 10 MG disintegrating tablet Take 10 mg by mouth  See admin instructions. Dissolve 10 mg in the mouth at bedtime     OLANZapine  zydis (ZYPREXA ) 5 MG disintegrating tablet Take 5 mg by mouth See admin instructions. Dissolve 5 mg in the mouth in the morning     tamsulosin  (FLOMAX ) 0.4 MG CAPS capsule Take 1 capsule (0.4 mg total) by mouth daily. 90 capsule 3   Calcium  Citrate 333 MG TABS Take 1 tablet by mouth daily. (Patient taking differently: Take 333 mg by mouth daily.)     cephALEXin (KEFLEX) 500 MG capsule Take 500 mg by mouth every 6 (six) hours.     Cholecalciferol  (VITAMIN D3 PO) Take 1 capsule by mouth in the morning.     doxycycline  (VIBRA -TABS) 100 MG tablet Take 1 tablet (100 mg total) by mouth 2 (two) times daily. 20 tablet 0   tadalafil  (CIALIS ) 5 MG tablet Take 1 tablet (5 mg total) by mouth daily. 10 tablet 0    No results found for this or any previous visit (from the  past 48 hours). No results found.  Review of Systems  Constitutional:  Positive for fever.  HENT: Negative.    Respiratory: Negative.    Cardiovascular: Negative.   Gastrointestinal:  Positive for abdominal distention.  Genitourinary: Negative.   Musculoskeletal:  Positive for arthralgias.  Skin:  Positive for wound.  Neurological: Negative.   Psychiatric/Behavioral: Negative.      Blood pressure (!) 138/91, pulse 85, temperature 97.9 F (36.6 C), temperature source Oral, resp. rate 16, height 5' 8.5 (1.74 m), weight 86.6 kg, SpO2 98%. Physical Exam Constitutional:      Appearance: Normal appearance.  HENT:     Head: Normocephalic and atraumatic.     Mouth/Throat:     Mouth: Mucous membranes are moist.  Eyes:     Extraocular Movements: Extraocular movements intact.     Pupils: Pupils are equal, round, and reactive to light.  Cardiovascular:     Rate and Rhythm: Normal rate and regular rhythm.     Pulses: Normal pulses.     Heart sounds: Normal heart sounds.  Pulmonary:     Effort: Pulmonary effort is normal.     Breath sounds: Normal breath sounds.  Abdominal:     General: Abdomen is flat.     Palpations: Abdomen is soft.  Musculoskeletal:     Comments: Left knee Draining wound with granulation tissue over anterior knee Palpable fluctuance appreciated Surrounding erythema and induration Pain with knee ROM but able to move 10-90 No pain with micro-motion Neurovascularly intact distally  Skin:    Capillary Refill: Capillary refill takes less than 2 seconds.  Neurological:     General: No focal deficit present.     Mental Status: He is alert and oriented to person, place, and time.  Psychiatric:        Mood and Affect: Mood normal.        Behavior: Behavior normal.      Assessment/Plan 74 year old male with a left knee pre-patellar abscess that has not improved on oral antibiotics  Plan for left knee irrigation and debridement.  Patient may benefit from  short admission for IV antibiotics  Adine JAYSON Mon, MD 10/03/2024, 2:17 PM       [1]  Allergies Allergen Reactions   Poison Oak Extract Rash and Other (See Comments)    Broke out severely

## 2024-10-03 NOTE — Op Note (Signed)
.  Orthopaedic Surgery Operative Note (CSN: [id number])  Robert Fowler  06-18-1951 Date of Surgery: 10/03/2024   Diagnoses:  Left knee cutaneous abscess  Procedure: Left knee abscess irrigation debridement (CPT 27310)   Post-operative plan: The patient will be weight bearing as tolerated.  The patient will be discharged on oral antibiotics.    Pain control with PRN pain medication preferring oral medicines.  Follow up plan will be scheduled in approximately 5 days for incision check  Post-Op Diagnosis: Same Surgeons:Primary: Sherida Adine BROCKS, MD Assistants:None Location: [facility] ROOM 08 Anesthesia: General Antibiotics: Ancef  2 g Tourniquet time: N/A Estimated Blood Loss: 50 cc Complications: None Specimens: None Implants: * No implants in log *  Indications for Surgery:   Robert Fowler is a 74 y.o. male with a left knee wound sustained while working on his kitchen floor.  His wound continues to get worse and had surrounding erythema and drainage.  His primary care physician treated him with oral Keflex and doxycycline  with no improvement in his symptoms.  He was seen in the office today and had a large area of fluctuance with surrounding induration and erythema at his anterior knee.  He was indicated for a left knee irrigation debridement of cutaneous abscess  Benefits and risks of operative and nonoperative management were discussed prior to surgery with patient/guardian(s) and informed consent form was completed.  Specific risks including worsening infection, bleeding, failure to heal, wound problems, need for additional surgery, and cardiopulmonary complication from anesthesia.  He understands all this and wished proceed with surgery   Procedure:   The patient was identified properly. Informed consent was obtained and the surgical site was marked. The patient was taken up to suite where general anesthesia was induced.  The patient was positioned surpine.  The left leg was prepped  and draped in the usual sterile fashion.  Timeout was performed before the beginning of the case.  There is an area of fluctuance measuring approximately 4 cm x 4 cm over the anterior knee.  A 3 cm incision was made over the area of fluctuance.  Upon incision there was immediate influx of pus.  Cultures were taken.  The wound was irrigated with 3 L of normal saline.  Any devitalized skin and soft tissue was sharply debrided.  The wound was approximated with 2-0 nylon sutures.  A sterile dressing was applied. Patient was awoken taken to PACU in stable condition.  Gardenia BROCKS Sherida 10/03/2024, 4:34 PM Orthopaedic Surgery

## 2024-10-03 NOTE — Anesthesia Procedure Notes (Signed)
 Procedure Name: LMA Insertion Date/Time: 10/03/2024 3:36 PM  Performed by: Arielys Wandersee, CRNAPre-anesthesia Checklist: Patient identified, Emergency Drugs available, Suction available and Patient being monitored Patient Re-evaluated:Patient Re-evaluated prior to induction Oxygen Delivery Method: Circle system utilized Preoxygenation: Pre-oxygenation with 100% oxygen Induction Type: IV induction Ventilation: Mask ventilation without difficulty LMA: LMA inserted LMA Size: 5.0 Tube type: Oral Number of attempts: 1 Airway Equipment and Method: Oral airway Placement Confirmation: positive ETCO2 and breath sounds checked- equal and bilateral Tube secured with: Tape Dental Injury: Teeth and Oropharynx as per pre-operative assessment

## 2024-10-04 NOTE — Anesthesia Postprocedure Evaluation (Signed)
"   Anesthesia Post Note  Patient: Robert Fowler  Procedure(s) Performed: IRRIGATION AND DEBRIDEMENT KNEE (Left: Knee)     Patient location during evaluation: PACU Anesthesia Type: General Level of consciousness: awake Pain management: pain level controlled Vital Signs Assessment: post-procedure vital signs reviewed and stable Respiratory status: spontaneous breathing, nonlabored ventilation and respiratory function stable Cardiovascular status: blood pressure returned to baseline and stable Postop Assessment: no apparent nausea or vomiting Anesthetic complications: no   No notable events documented.  Last Vitals:  Vitals:   10/03/24 1645 10/03/24 1700  BP: (!) 129/95 (!) 147/88  Pulse: 84 73  Resp: 14 19  Temp:    SpO2: 100% 95%    Last Pain:  Vitals:   10/03/24 1700  TempSrc:   PainSc: 0-No pain   Pain Goal:                   Rayla Pember P Meryle Pugmire      "

## 2024-10-05 ENCOUNTER — Encounter (HOSPITAL_COMMUNITY): Payer: Self-pay | Admitting: Orthopedic Surgery

## 2024-10-08 LAB — AEROBIC/ANAEROBIC CULTURE W GRAM STAIN (SURGICAL/DEEP WOUND): Gram Stain: NONE SEEN

## 2024-10-09 ENCOUNTER — Encounter: Payer: Self-pay | Admitting: Family Medicine

## 2024-10-09 ENCOUNTER — Ambulatory Visit: Admitting: Family Medicine

## 2024-10-09 VITALS — BP 120/82 | HR 74 | Temp 98.1°F | Ht 68.5 in | Wt 187.6 lb

## 2024-10-09 DIAGNOSIS — L02416 Cutaneous abscess of left lower limb: Secondary | ICD-10-CM

## 2024-10-09 DIAGNOSIS — E871 Hypo-osmolality and hyponatremia: Secondary | ICD-10-CM | POA: Diagnosis not present

## 2024-10-09 DIAGNOSIS — F319 Bipolar disorder, unspecified: Secondary | ICD-10-CM

## 2024-10-09 DIAGNOSIS — F250 Schizoaffective disorder, bipolar type: Secondary | ICD-10-CM | POA: Diagnosis not present

## 2024-10-09 LAB — CBC WITH DIFFERENTIAL/PLATELET
Basophils Absolute: 0.1 K/uL (ref 0.0–0.1)
Basophils Relative: 1.1 % (ref 0.0–3.0)
Eosinophils Absolute: 0.2 K/uL (ref 0.0–0.7)
Eosinophils Relative: 4.9 % (ref 0.0–5.0)
HCT: 37.1 % — ABNORMAL LOW (ref 39.0–52.0)
Hemoglobin: 13 g/dL (ref 13.0–17.0)
Lymphocytes Relative: 20.5 % (ref 12.0–46.0)
Lymphs Abs: 1 K/uL (ref 0.7–4.0)
MCHC: 34.9 g/dL (ref 30.0–36.0)
MCV: 83.5 fl (ref 78.0–100.0)
Monocytes Absolute: 0.5 K/uL (ref 0.1–1.0)
Monocytes Relative: 10.2 % (ref 3.0–12.0)
Neutro Abs: 3.2 K/uL (ref 1.4–7.7)
Neutrophils Relative %: 63.3 % (ref 43.0–77.0)
Platelets: 259 K/uL (ref 150.0–400.0)
RBC: 4.44 Mil/uL (ref 4.22–5.81)
RDW: 14.5 % (ref 11.5–15.5)
WBC: 5 K/uL (ref 4.0–10.5)

## 2024-10-09 LAB — COMPREHENSIVE METABOLIC PANEL WITH GFR
ALT: 13 U/L (ref 3–53)
AST: 9 U/L (ref 5–37)
Albumin: 4.1 g/dL (ref 3.5–5.2)
Alkaline Phosphatase: 77 U/L (ref 39–117)
BUN: 6 mg/dL (ref 6–23)
CO2: 30 meq/L (ref 19–32)
Calcium: 9.8 mg/dL (ref 8.4–10.5)
Chloride: 95 meq/L — ABNORMAL LOW (ref 96–112)
Creatinine, Ser: 0.67 mg/dL (ref 0.40–1.50)
GFR: 93.22 mL/min
Glucose, Bld: 89 mg/dL (ref 70–99)
Potassium: 4.2 meq/L (ref 3.5–5.1)
Sodium: 130 meq/L — ABNORMAL LOW (ref 135–145)
Total Bilirubin: 0.5 mg/dL (ref 0.2–1.2)
Total Protein: 7.1 g/dL (ref 6.0–8.3)

## 2024-10-09 NOTE — Progress Notes (Signed)
 " Ph: [phone] Fax: [phone]   Patient ID: Robert Fowler, male    DOB: 1951-04-28, 74 y.o.   MRN: [id number]  This visit was conducted in person.  BP 120/82 (BP Location: Right Arm, Patient Position: Sitting, Cuff Size: Normal)   Pulse 74   Temp 98.1 F (36.7 C) (Oral)   Ht 5' 8.5 (1.74 m)   Wt 187 lb 9.6 oz (85.1 kg)   SpO2 97%   BMI 28.11 kg/m    CC: hosp f/u visit  Subjective:   HPI: Robert Fowler is a 74 y.o. male presenting on 10/09/2024 for Hospitalization Follow-up ([facility] Fu was discharged 1/8 as well as when wound was last irrigated/Will go see surgeon this afternoon for follow up today//Left knee looks must better, leg selling is gone with only minor knee swelling and redness/Pt states he only has minor to no pain but states he has been real anxious about this infection and would like to go see his psych team//)   See prior notes for details.  He is recovering well after knee cutaneous abscess irrigation / debridement surgery by orthopedist Dr Sherida on 10/03/2024.   Wound is healing well - no nausea, fever, with improvement in leg erythema and edema.  Has f/u appt 2:30pm today Wednesday with ortho team.   Wound culture x2 grew rare growth of MSSA.   Started on Clindamycin  300mg  TID + augmentin  875/125mg  BID. Other abx were discontinued (previously on keflex and doxycycline ).   Tolerating antibiotics well without nausea, diarrhea or loose stools  Sees Dr Prentice psychiatrist at [place]. Continues depakote  ER 750mg  nightly, fluoxetine  20mg  and zyprexa  5/10mg  daily, with hydroxyzine  25mg  PRN anxiety. Mother passed away 03/18/2024  - he has struggled with her passing. Notes worsening mood difficulty with recent health issues. Last saw psychiatry 08/2024.   Alcohol use - off and on habitual use up to 6-8 glasses of wine daily. He states he quit drinking 05/27/2024 and has remained abstinent.   Has upcoming f/u with urology on Friday. Foley fully removed ~2 wks ago.       Relevant past medical, surgical, family and social history reviewed and updated as indicated. Interim medical history since our last visit reviewed. Allergies and medications reviewed and updated. Outpatient Medications Prior to Visit  Medication Sig Dispense Refill   acetaminophen  (TYLENOL ) 500 MG tablet Take 1,000 mg by mouth at bedtime.     amLODipine  (NORVASC ) 2.5 MG tablet Take 1 tablet (2.5 mg total) by mouth daily. 90 tablet 3   amoxicillin -clavulanate (AUGMENTIN ) 875-125 MG tablet Take 1 tablet by mouth 2 (two) times daily. 20 tablet 0   aspirin  81 MG tablet Take 81 mg by mouth daily.     B Complex Vitamins (VITAMIN B COMPLEX PO) Take 1 capsule by mouth every other day.     benztropine  (COGENTIN ) 0.5 MG tablet Take 0.5 mg by mouth at bedtime.     Calcium  Citrate 333 MG TABS Take 1 tablet by mouth daily.     Cholecalciferol  (VITAMIN D3 PO) Take 1 capsule by mouth in the morning.     clindamycin  (CLEOCIN ) 300 MG capsule Take 1 capsule (300 mg total) by mouth 3 (three) times daily. 30 capsule 0   divalproex  (DEPAKOTE  ER) 250 MG 24 hr tablet Take 750 mg by mouth at bedtime.     FLUoxetine  (PROZAC ) 20 MG capsule Take 20 mg by mouth in the morning.     hydrOXYzine  (ATARAX /VISTARIL ) 25 MG tablet Take 25  mg by mouth 2 (two) times daily as needed for anxiety.     OLANZapine  zydis (ZYPREXA ) 10 MG disintegrating tablet Take 10 mg by mouth See admin instructions. Dissolve 10 mg in the mouth at bedtime     OLANZapine  zydis (ZYPREXA ) 5 MG disintegrating tablet Take 5 mg by mouth See admin instructions. Dissolve 5 mg in the mouth in the morning     tadalafil  (CIALIS ) 5 MG tablet Take 1 tablet (5 mg total) by mouth daily. 10 tablet 0   tamsulosin  (FLOMAX ) 0.4 MG CAPS capsule Take 1 capsule (0.4 mg total) by mouth daily. 90 capsule 3   No facility-administered medications prior to visit.     Per HPI unless specifically indicated in ROS section below Review of Systems  Objective:  BP 120/82 (BP  Location: Right Arm, Patient Position: Sitting, Cuff Size: Normal)   Pulse 74   Temp 98.1 F (36.7 C) (Oral)   Ht 5' 8.5 (1.74 m)   Wt 187 lb 9.6 oz (85.1 kg)   SpO2 97%   BMI 28.11 kg/m   Wt Readings from Last 3 Encounters:  10/09/24 187 lb 9.6 oz (85.1 kg)  10/03/24 191 lb (86.6 kg)  10/02/24 191 lb (86.6 kg)      Physical Exam Vitals and nursing note reviewed.  Constitutional:      Appearance: Normal appearance. He is not ill-appearing.  Eyes:     Extraocular Movements: Extraocular movements intact.     Conjunctiva/sclera: Conjunctivae normal.     Pupils: Pupils are equal, round, and reactive to light.  Cardiovascular:     Rate and Rhythm: Normal rate and regular rhythm.     Pulses: Normal pulses.     Heart sounds: Normal heart sounds. No murmur heard. Pulmonary:     Effort: Pulmonary effort is normal. No respiratory distress.     Breath sounds: Normal breath sounds. No wheezing, rhonchi or rales.  Musculoskeletal:        General: No tenderness. Normal range of motion.     Right lower leg: No edema.     Left lower leg: No edema.  Skin:    General: Skin is warm and dry.     Findings: No erythema or rash.     Comments:  Marked improvement in left knee wound s/p knee irrigation in OR with resolution of erythema edema, FROM preserved at knees  Neurological:     Mental Status: He is alert.  Psychiatric:        Attention and Perception: Attention normal.        Mood and Affect: Mood and affect normal.        Speech: Speech normal.        Behavior: Behavior normal. Behavior is cooperative.        Thought Content: Thought content normal.     Comments: Tangential        Results for orders placed or performed in visit on 10/09/24  Comprehensive metabolic panel with GFR   Collection Time: 10/09/24 10:45 AM  Result Value Ref Range   Sodium 130 (L) 135 - 145 mEq/L   Potassium 4.2 3.5 - 5.1 mEq/L   Chloride 95 (L) 96 - 112 mEq/L   CO2 30 19 - 32 mEq/L   Glucose, Bld  89 70 - 99 mg/dL   BUN 6 6 - 23 mg/dL   Creatinine, Ser 9.32 0.40 - 1.50 mg/dL   Total Bilirubin 0.5 0.2 - 1.2 mg/dL   Alkaline Phosphatase 77 39 -  117 U/L   AST 9 5 - 37 U/L   ALT 13 3 - 53 U/L   Total Protein 7.1 6.0 - 8.3 g/dL   Albumin 4.1 3.5 - 5.2 g/dL   GFR 06.77 >39.99 mL/min   Calcium  9.8 8.4 - 10.5 mg/dL  CBC with Differential/Platelet   Collection Time: 10/09/24 10:45 AM  Result Value Ref Range   WBC 5.0 4.0 - 10.5 K/uL   RBC 4.44 4.22 - 5.81 Mil/uL   Hemoglobin 13.0 13.0 - 17.0 g/dL   HCT 62.8 (L) 60.9 - 47.9 %   MCV 83.5 78.0 - 100.0 fl   MCHC 34.9 30.0 - 36.0 g/dL   RDW 85.4 88.4 - 84.4 %   Platelets 259.0 150.0 - 400.0 K/uL   Neutrophils Relative % 63.3 43.0 - 77.0 %   Lymphocytes Relative 20.5 12.0 - 46.0 %   Monocytes Relative 10.2 3.0 - 12.0 %   Eosinophils Relative 4.9 0.0 - 5.0 %   Basophils Relative 1.1 0.0 - 3.0 %   Neutro Abs 3.2 1.4 - 7.7 K/uL   Lymphs Abs 1.0 0.7 - 4.0 K/uL   Monocytes Absolute 0.5 0.1 - 1.0 K/uL   Eosinophils Absolute 0.2 0.0 - 0.7 K/uL   Basophils Absolute 0.1 0.0 - 0.1 K/uL   Lab Results  Component Value Date   TSH 0.83 04/30/2024   Assessment & Plan:   Problem List Items Addressed This Visit     Bipolar 1 disorder (HCC)   Continue Depakote  ER 750mg  nightly, fluoxetine  20mg  daily, and zyprexa  5/10mg  daily, with hydroxyzine  25mg  PRN anxiety      Schizoaffective disorder (HCC)   Regularly sees psychiatry on depakote  ER 750mg  nightly, fluoxetine  20mg  and zyprexa  5/10mg  daily, with hydroxyzine  25mg  PRN anxiety - he will call and schedule f/u as almost due       Hyponatremia   Update labs.       Relevant Orders   Comprehensive metabolic panel with GFR (Completed)   CBC with Differential/Platelet (Completed)   Cutaneous abscess of left knee - Primary   Marked improvement after OR incision irrigation and debridement - appreciate ortho care.  Rec finish antibiotic course.  Wound cultures grew MSSA       Relevant Orders    Comprehensive metabolic panel with GFR (Completed)   CBC with Differential/Platelet (Completed)     No orders of the defined types were placed in this encounter.   Orders Placed This Encounter  Procedures   Comprehensive metabolic panel with GFR   CBC with Differential/Platelet    Patient Instructions  Labs today  Start eating yogurt, consider Activia when you finish antibiotics to replenish healthy gut bacteria.  Continue current medicines.  Good to see you today Keep orthopedist appointment today and urology appointment on Friday  Follow up plan: No follow-ups on file.  Anton Blas, MD   "

## 2024-10-09 NOTE — Patient Instructions (Addendum)
 Labs today  Start eating yogurt, consider Activia when you finish antibiotics to replenish healthy gut bacteria.  Continue current medicines.  Good to see you today Keep orthopedist appointment today and urology appointment on Friday

## 2024-10-10 ENCOUNTER — Ambulatory Visit: Payer: Self-pay | Admitting: Family Medicine

## 2024-10-10 NOTE — Telephone Encounter (Signed)
 Called patient reviewed all information and repeated back to me. Will call if any questions.  Note from 10/19/2024 and labs forwards to Robley Easter, NP

## 2024-10-10 NOTE — Assessment & Plan Note (Signed)
 Continue Depakote  ER 750mg  nightly, fluoxetine  20mg  daily, and zyprexa  5/10mg  daily, with hydroxyzine  25mg  PRN anxiety

## 2024-10-10 NOTE — Assessment & Plan Note (Signed)
 Regularly sees psychiatry on depakote  ER 750mg  nightly, fluoxetine  20mg  and zyprexa  5/10mg  daily, with hydroxyzine  25mg  PRN anxiety - he will call and schedule f/u as almost due

## 2024-10-10 NOTE — Assessment & Plan Note (Addendum)
 Marked improvement after OR incision irrigation and debridement - appreciate ortho care.  Rec finish antibiotic course.  Wound cultures grew MSSA

## 2024-10-10 NOTE — Assessment & Plan Note (Signed)
 Update labs.

## 2024-10-13 ENCOUNTER — Ambulatory Visit (INDEPENDENT_AMBULATORY_CARE_PROVIDER_SITE_OTHER)
Admission: EM | Admit: 2024-10-13 | Discharge: 2024-10-14 | Disposition: A | Discharge status: Other Acute Inpatient Hospital | Attending: Psychiatry | Admitting: Psychiatry

## 2024-10-13 ENCOUNTER — Other Ambulatory Visit: Payer: Self-pay

## 2024-10-13 DIAGNOSIS — F3110 Bipolar disorder, current episode manic without psychotic features, unspecified: Secondary | ICD-10-CM

## 2024-10-13 DIAGNOSIS — N401 Enlarged prostate with lower urinary tract symptoms: Secondary | ICD-10-CM | POA: Insufficient documentation

## 2024-10-13 DIAGNOSIS — F200 Paranoid schizophrenia: Secondary | ICD-10-CM | POA: Insufficient documentation

## 2024-10-13 DIAGNOSIS — F319 Bipolar disorder, unspecified: Secondary | ICD-10-CM | POA: Insufficient documentation

## 2024-10-13 DIAGNOSIS — F3112 Bipolar disorder, current episode manic without psychotic features, moderate: Secondary | ICD-10-CM | POA: Insufficient documentation

## 2024-10-13 DIAGNOSIS — Z87891 Personal history of nicotine dependence: Secondary | ICD-10-CM | POA: Insufficient documentation

## 2024-10-13 DIAGNOSIS — Z7982 Long term (current) use of aspirin: Secondary | ICD-10-CM | POA: Insufficient documentation

## 2024-10-13 DIAGNOSIS — I129 Hypertensive chronic kidney disease with stage 1 through stage 4 chronic kidney disease, or unspecified chronic kidney disease: Secondary | ICD-10-CM | POA: Insufficient documentation

## 2024-10-13 DIAGNOSIS — Z634 Disappearance and death of family member: Secondary | ICD-10-CM | POA: Insufficient documentation

## 2024-10-13 DIAGNOSIS — M810 Age-related osteoporosis without current pathological fracture: Secondary | ICD-10-CM | POA: Insufficient documentation

## 2024-10-13 DIAGNOSIS — Z79899 Other long term (current) drug therapy: Secondary | ICD-10-CM | POA: Insufficient documentation

## 2024-10-13 DIAGNOSIS — G4700 Insomnia, unspecified: Secondary | ICD-10-CM | POA: Insufficient documentation

## 2024-10-13 LAB — HEMOGLOBIN A1C
Hgb A1c MFr Bld: 5.5 % (ref 4.8–5.6)
Mean Plasma Glucose: 111.15 mg/dL

## 2024-10-13 LAB — COMPREHENSIVE METABOLIC PANEL WITH GFR
ALT: 12 U/L (ref 0–44)
AST: 13 U/L — ABNORMAL LOW (ref 15–41)
Albumin: 4.3 g/dL (ref 3.5–5.0)
Alkaline Phosphatase: 87 U/L (ref 38–126)
Anion gap: 13 (ref 5–15)
BUN: 11 mg/dL (ref 8–23)
CO2: 23 mmol/L (ref 22–32)
Calcium: 9.7 mg/dL (ref 8.9–10.3)
Chloride: 99 mmol/L (ref 98–111)
Creatinine, Ser: 0.8 mg/dL (ref 0.61–1.24)
GFR, Estimated: >60 mL/min
Glucose, Bld: 91 mg/dL (ref 70–99)
Potassium: 4.3 mmol/L (ref 3.5–5.1)
Sodium: 135 mmol/L (ref 135–145)
Total Bilirubin: 0.7 mg/dL (ref 0.0–1.2)
Total Protein: 7.2 g/dL (ref 6.5–8.1)

## 2024-10-13 LAB — CBC WITH DIFFERENTIAL/PLATELET
Abs Immature Granulocytes: 0.04 K/uL (ref 0.00–0.07)
Basophils Absolute: 0.1 K/uL (ref 0.0–0.1)
Basophils Relative: 1 %
Eosinophils Absolute: 0.2 K/uL (ref 0.0–0.5)
Eosinophils Relative: 2 %
HCT: 37.6 % — ABNORMAL LOW (ref 39.0–52.0)
Hemoglobin: 13.4 g/dL (ref 13.0–17.0)
Immature Granulocytes: 1 %
Lymphocytes Relative: 18 %
Lymphs Abs: 1.1 K/uL (ref 0.7–4.0)
MCH: 29.3 pg (ref 26.0–34.0)
MCHC: 35.6 g/dL (ref 30.0–36.0)
MCV: 82.1 fL (ref 80.0–100.0)
Monocytes Absolute: 0.6 K/uL (ref 0.1–1.0)
Monocytes Relative: 10 %
Neutro Abs: 4.3 K/uL (ref 1.7–7.7)
Neutrophils Relative %: 68 %
Platelets: 227 K/uL (ref 150–400)
RBC: 4.58 MIL/uL (ref 4.22–5.81)
RDW: 14.4 % (ref 11.5–15.5)
WBC: 6.3 K/uL (ref 4.0–10.5)
nRBC: 0 % (ref 0.0–0.2)

## 2024-10-13 LAB — LIPID PANEL
Cholesterol: 156 mg/dL (ref 0–200)
HDL: 63 mg/dL
LDL Cholesterol: 80 mg/dL (ref 0–99)
Total CHOL/HDL Ratio: 2.5 ratio
Triglycerides: 63 mg/dL
VLDL: 13 mg/dL (ref 0–40)

## 2024-10-13 LAB — VALPROIC ACID LEVEL: Valproic Acid Lvl: 49 ug/mL — ABNORMAL LOW (ref 50–100)

## 2024-10-13 LAB — SARS CORONAVIRUS 2 BY RT PCR: SARS Coronavirus 2 by RT PCR: NEGATIVE

## 2024-10-13 LAB — ETHANOL: Alcohol, Ethyl (B): <15 mg/dL

## 2024-10-13 LAB — TSH: TSH: 1.38 u[IU]/mL (ref 0.350–4.500)

## 2024-10-13 MED ORDER — CALCIUM CITRATE 950 (200 CA) MG PO TABS
950.0000 mg | ORAL_TABLET | Freq: Every day | ORAL | Status: DC
  Filled 2024-10-13: qty 1

## 2024-10-13 MED ORDER — ACETAMINOPHEN 325 MG PO TABS
650.0000 mg | ORAL_TABLET | Freq: Four times a day (QID) | ORAL | Status: DC | PRN
  Administered 2024-10-13: 650 mg via ORAL
  Filled 2024-10-13: qty 2

## 2024-10-13 MED ORDER — DIVALPROEX SODIUM ER 500 MG PO TB24
750.0000 mg | ORAL_TABLET | Freq: Every day | ORAL | Status: DC
  Administered 2024-10-13: 750 mg via ORAL
  Filled 2024-10-13: qty 1

## 2024-10-13 MED ORDER — FLUOXETINE HCL 20 MG PO CAPS: 20.0000 mg | ORAL_CAPSULE | Freq: Every morning | ORAL | Status: DC

## 2024-10-13 MED ORDER — ASPIRIN 81 MG PO CHEW
81.0000 mg | CHEWABLE_TABLET | Freq: Every day | ORAL | Status: DC
  Filled 2024-10-13: qty 1

## 2024-10-13 MED ORDER — HYDROXYZINE HCL 25 MG PO TABS: 25.0000 mg | ORAL_TABLET | Freq: Three times a day (TID) | ORAL | Status: DC | PRN

## 2024-10-13 MED ORDER — VITAMIN D 25 MCG (1000 UNIT) PO TABS: 1000.0000 [IU] | ORAL_TABLET | Freq: Every morning | ORAL | Status: DC

## 2024-10-13 MED ORDER — AMLODIPINE BESYLATE 2.5 MG PO TABS: 2.5000 mg | ORAL_TABLET | Freq: Every day | ORAL | Status: DC

## 2024-10-13 MED ORDER — OLANZAPINE 5 MG PO TBDP
5.0000 mg | ORAL_TABLET | Freq: Every day | ORAL | Status: DC
  Filled 2024-10-13: qty 1

## 2024-10-13 MED ORDER — BENZTROPINE MESYLATE 0.5 MG PO TABS
0.5000 mg | ORAL_TABLET | Freq: Every day | ORAL | Status: DC
  Administered 2024-10-13: 0.5 mg via ORAL
  Filled 2024-10-13: qty 1

## 2024-10-13 MED ORDER — TRIPLE ANTIBIOTIC 3.5-400-5000 EX OINT
1.0000 | TOPICAL_OINTMENT | Freq: Three times a day (TID) | CUTANEOUS | Status: DC
  Administered 2024-10-13: 1 via TOPICAL
  Filled 2024-10-13: qty 1

## 2024-10-13 MED ORDER — OLANZAPINE 10 MG PO TBDP: 10.0000 mg | ORAL_TABLET | ORAL | Status: DC

## 2024-10-13 MED ORDER — OLANZAPINE 5 MG PO TBDP: 5.0000 mg | ORAL_TABLET | ORAL | Status: DC

## 2024-10-13 MED ORDER — TAMSULOSIN HCL 0.4 MG PO CAPS
0.4000 mg | ORAL_CAPSULE | Freq: Every day | ORAL | Status: DC
  Administered 2024-10-13: 0.4 mg via ORAL
  Filled 2024-10-13: qty 1

## 2024-10-13 MED ORDER — OLANZAPINE 10 MG PO TBDP
10.0000 mg | ORAL_TABLET | Freq: Every day | ORAL | Status: DC
  Administered 2024-10-13: 10 mg via ORAL
  Filled 2024-10-13: qty 1

## 2024-10-13 MED ORDER — OLANZAPINE 5 MG PO TBDP: 5.0000 mg | ORAL_TABLET | Freq: Every day | ORAL | Status: DC

## 2024-10-13 MED ORDER — MAGNESIUM HYDROXIDE 400 MG/5ML PO SUSP: 30.0000 mL | Freq: Every day | ORAL | Status: DC | PRN

## 2024-10-13 MED ORDER — HYDROXYZINE HCL 25 MG PO TABS
25.0000 mg | ORAL_TABLET | Freq: Two times a day (BID) | ORAL | Status: DC | PRN
  Administered 2024-10-13: 25 mg via ORAL
  Filled 2024-10-13: qty 1

## 2024-10-13 MED ORDER — OLANZAPINE 10 MG IM SOLR: 5.0000 mg | Freq: Three times a day (TID) | INTRAMUSCULAR | Status: DC | PRN

## 2024-10-13 MED ORDER — OLANZAPINE 5 MG PO TBDP: 5.0000 mg | ORAL_TABLET | Freq: Three times a day (TID) | ORAL | Status: DC | PRN

## 2024-10-13 MED ORDER — ALUM & MAG HYDROXIDE-SIMETH 200-200-20 MG/5ML PO SUSP: 30.0000 mL | ORAL | Status: DC | PRN

## 2024-10-13 NOTE — ED Notes (Incomplete)
 Pt denies SI/HI/AVH. Lower abdominal pain managed with prn tylenol . Compliant with scheduled meds; prn atarax  given. He was anxious, cooperative, intrusive, hyperactive, and pleasant. No behavioral concerns at this time. Continue to monitor for safety.

## 2024-10-13 NOTE — Progress Notes (Signed)
" °   10/13/24 1129  [facility] Triage Screening (Walk-ins at [facility] only)  How Did You Hear About Us ? Family/Friend  What Is the Reason for Your Visit/Call Today? Robert Fowler is a 57Y male presenting to [facility] as a vol walk-in with his family. Pt states he is here today because he feels like he is taking too many medications. Pt states he is also afriad he is getting dementia because his mother died of it and he has started to stutter his words a lot. Pt states he has been feeling very hyper and has not been getting much sleep. Pt states he has an a hx of past inpatient stays going back to 1968. Pt states he just needs something that will calm him down. Pt has a hx of schizophrenia and bipolar. Pt is seeing a therapist and taking medicine, Pt denies SI, HI, and substance use.  How Long Has This Been Causing You Problems? 1 wk - 1 month  Have You Recently Had Any Thoughts About Hurting Yourself? No  Are You Planning to Commit Suicide/Harm Yourself At This time? No  Have you Recently Had Thoughts About Hurting Someone Sherral? No  Are You Planning To Harm Someone At This Time? No  Are you currently experiencing any auditory, visual or other hallucinations? Yes  Please explain the hallucinations you are currently experiencing: sees things moving arounf sometimes  Have You Used Any Alcohol or Drugs in the Past 24 Hours? No  Do you have any current medical co-morbidities that require immediate attention? No  What Do You Feel Would Help You the Most Today? Medication(s)  Determination of Need Routine (7 days)  Options For Referral Medication Management;Outpatient Therapy    "

## 2024-10-13 NOTE — ED Provider Notes (Signed)
 [facility] Continuous Assessment Admission H&P  Date: 10/13/24 Patient Name: Robert Fowler MRN: [id number] Chief Complaint: Manic symptoms  Diagnoses:  Final diagnoses:  Bipolar I disorder, most recent episode (or current) manic (HCC)  Insomnia, unspecified type    HPI: Robert Fowler, 74 y.o., male  presented to [facility] voluntarily as a walk in, accompanied by his sister, with complaints of manic symptoms. Patient seen face to face by this provider, and chart reviewed on 10/13/24.  Per chart review patient has a past psychiatric history of bipolar disorder, schizoaffective disorder, alcohol use disorder and reported history of paranoid schizophrenia.  Last inpatient hospitalization in 2018 at [facility].  Patient currently being followed by provider at [facility] for outpatient medication management.  Pertinent medical history includes recent I&D of left knee abscess (per note, sutures can be removed on 10/16/24), hypertension, and hx of BPH with urinary retention.   On evaluation Robert Fowler reports that he has not slept in 3 days, has not been consistent with taking his prescribed medications as he usually does and feels very hyperactive.  Patient states that he has been stable on his medications for years and lives on his own in a small home, able to take care of himself.  He states that his neighbor called his sister with concerns of erratic behavior.  Patient reports that his mother passed away in 04/01/24, just 4 days after his birthday and he does not feel that he has coped with that yet.  Patient states that I know myself very well and feel that I am having a manic episode.  I think it may need to be in the hospital .  Patient reports history of multiple inpatient hospitalizations years ago.  Patient is very tangential during assessment making multiple delusional statements including the world is going to an apocalypse, world war 3 will be  happening soon due to Trump and the Russians, the news and the Lord send me messages.  Patient reports that he did not get any sleep at all last night as he was up all night watching sex and I am sorry if that offended you.  Patient reports being up all night watching pornography but is in no way sexual inappropriate towards staff.  Discussed plan for patient to receive inpatient psychiatric hospitalization for mood stabilization and medication management.  Patient is agreeable to this plan.  This provider spoke with patient's Sister Robert Fowler, who states that patient told her that he needed to be seen and that patient is very aware of his mental state and when he does need additional help.  She reports that she has noticed a change in behaviors for the past couple of weeks even when consistently on his current medication regimen.  She states that he is unable to stop talking and has very pressured speech, requires redirection throughout conversation and in social settings, he is unable to remember to take his medications and is usually very logical.  She states that he has been living on his own for years and able to maintain his home, ADLs and take his medications regularly without assistance.  She reports that the neighbor called her due to some concerns about his behaviors.  Sister reports that his baseline is calm, talkative but not pressured and he usually sleeps well.  She understands that there is limited control over where patient receives inpatient treatment but is hopeful that he will be able to remain closer to the  [place] area where his family resides.   During evaluation Robert Fowler is sitting up in assessment room, in no acute distress.  He is alert/oriented x 4; hyperactive but cooperative; and euphoric mood  with labile affect.  He is speaking in a pressured tone at increased volume, and rapid pace; with good eye contact.  His thought process is tangential and scattered. There is no  objective indication that he is currently responding to internal/external stimuli but does display some paranoid delusions in regards to government and wars. Pt also hyper-religious at times. He has denied suicidal ideation, self-harm, homicidal ideation, auditory hallucinations, and visual hallucinations. Patient has remained cooperative throughout assessment and has answered questions appropriately with redirection.    Total Time spent with patient: 45 minutes  Musculoskeletal  Strength & Muscle Tone: decreased Gait & Station: normal Patient leans: N/A  Psychiatric Specialty Exam  Presentation General Appearance:  Appropriate for Environment  Eye Contact: Good  Speech: Pressured; Clear and Coherent  Speech Volume: Increased  Handedness: Right   Mood and Affect  Mood: Euphoric; Labile  Affect: Labile; Tearful   Thought Process  Thought Processes: Coherent  Descriptions of Associations:Tangential  Orientation:Full (Time, Place and Person)  Thought Content:Tangential; Scattered; Delusions  Diagnosis of Schizophrenia or Schizoaffective disorder in past: Yes  Duration of Psychotic Symptoms: Greater than six months  Hallucinations:Hallucinations: None  Ideas of Reference:Delusions  Suicidal Thoughts:Suicidal Thoughts: No  Homicidal Thoughts:Homicidal Thoughts: No   Sensorium  Memory: Immediate Fair; Recent Fair  Judgment: Impaired  Insight: Fair   Art Therapist  Concentration: Poor  Attention Span: Poor  Recall: [employer] of Knowledge: Fair  Language: Fair   Psychomotor Activity  Psychomotor Activity: Psychomotor Activity: Normal; Restlessness   Assets  Assets: Desire for Improvement; Housing; Social Support; Manufacturing Systems Engineer; Financial Resources/Insurance; Leisure Time; Resilience   Sleep  Sleep: Sleep: Poor Number of Hours of Sleep: 1   No data recorded  Physical Exam Vitals and nursing note reviewed.   Constitutional:      Appearance: Normal appearance.  HENT:     Head: Normocephalic.  Eyes:     Extraocular Movements: Extraocular movements intact.  Cardiovascular:     Rate and Rhythm: Normal rate.  Pulmonary:     Effort: Pulmonary effort is normal.  Musculoskeletal:        General: Normal range of motion.     Cervical back: Normal range of motion.  Skin:    Comments: Wound with sutures to left knee from I&D  Neurological:     Mental Status: He is alert and oriented to person, place, and time.  Psychiatric:        Attention and Perception: He is inattentive.        Mood and Affect: Mood is elated. Affect is labile.        Speech: Speech is rapid and pressured and tangential.        Behavior: Behavior is hyperactive. Behavior is cooperative.        Thought Content: Thought content is delusional.        Cognition and Memory: Memory normal.        Judgment: Judgment is impulsive.    Review of Systems  Constitutional: Negative.   HENT: Negative.    Eyes: Negative.   Respiratory: Negative.    Cardiovascular: Negative.   Gastrointestinal: Negative.   Genitourinary:        Hx of urinary retention r/t enlarged prostate  Musculoskeletal:  Positive for joint pain.  Neurological: Negative.   Endo/Heme/Allergies: Negative.   Psychiatric/Behavioral:  The patient has insomnia.        Manic symptoms    Blood pressure (!) 123/91, pulse 85, temperature 98.3 F (36.8 C), temperature source Oral, resp. rate 18, SpO2 94%. There is no height or weight on file to calculate BMI.  Past Psychiatric History: Paranoid schizophrenia and bipolar disorder. Inpatient tx years ago at [facility] in 2018 and [facility] in the 90s, per patient.    Is the patient at risk to self? No  Has the patient been a risk to self in the past 6 months? No .    Has the patient been a risk to self within the distant past? No   Is the patient a risk to others? No   Has the patient been a risk to others in the past 6  months? No   Has the patient been a risk to others within the distant past? No   Past Medical History:  Past Medical History:  Diagnosis Date   Arthritis    BENIGN PROSTATIC HYPERTROPHY 07/18/2007   Bipolar disorder (HCC)    Habitual alcohol use    Lesion of oral mucosa 03/21/2018   Biopsy 03/2018 [facility]) - INFLAMMATORY FIBROUS HYPERPLASIA CONSISTENT WITH DENTURE INJURY (EPULIS FISSURATUM).    OSTEOPOROSIS 09/04/2007   DEXA -2.8 lumbar (03/2015)   Pes planus    Prediabetes 2014   Schizoaffective disorder (HCC)    h/o paranoid schizophrenia   Wears dentures    full upper and lower    Patient Active Problem List   Diagnosis Date Noted   Cutaneous abscess of left knee 09/30/2024   Hyponatremia 04/20/2024   Acute UTI (urinary tract infection) 04/20/2024   Acute urinary retention 04/20/2024   Abnormal CBC 05/16/2023   Vitamin B1 deficiency 05/16/2023   Vitamin B12 deficiency 05/16/2023   HLD (hyperlipidemia) 05/06/2023   Elevated PSA 05/10/2022   Essential hypertension 04/17/2019   Schizoaffective disorder (HCC)    Medicare annual wellness visit, subsequent 03/14/2018   Nocturnal dyspnea 06/13/2017   Bipolar 1 disorder (HCC) 04/11/2017   Advanced care planning/counseling discussion 03/03/2015   Habitual alcohol use    Health maintenance examination 01/27/2014   Pes planus 01/11/2008   Osteopenia 09/04/2007   Ex-smoker 09/04/2007   Hematuria 07/18/2007   Benign prostatic hyperplasia 07/18/2007   Pain and swelling of left lower leg 07/18/2007     Family History:  Family History  Problem Relation Age of Onset   COPD Mother        smoker   Dementia Mother    Alcohol abuse Father    Alcohol abuse Maternal Grandmother    CAD Neg Hx    Stroke Neg Hx    Cancer Neg Hx    Diabetes Neg Hx    Hypertension Neg Hx      Social History: Pt currently unemployed and lives alone. He denies substance abuse.   Last Labs:  Office Visit on 10/09/2024  Component Date Value Ref  Range Status   Sodium 10/09/2024 130 (L)  135 - 145 mEq/L Final   Potassium 10/09/2024 4.2  3.5 - 5.1 mEq/L Final   Chloride 10/09/2024 95 (L)  96 - 112 mEq/L Final   CO2 10/09/2024 30  19 - 32 mEq/L Final   Elevated LDH levels may cause falsely increased CO2 results. If LDH is >2000 U/L, a positive bias of 12% is possible.   Glucose, Bld 10/09/2024 89  70 - 99 mg/dL Final  BUN 10/09/2024 6  6 - 23 mg/dL Final   Creatinine, Ser 10/09/2024 0.67  0.40 - 1.50 mg/dL Final   Total Bilirubin 10/09/2024 0.5  0.2 - 1.2 mg/dL Final   Alkaline Phosphatase 10/09/2024 77  39 - 117 U/L Final   AST 10/09/2024 9  5 - 37 U/L Final   ALT 10/09/2024 13  3 - 53 U/L Final   Total Protein 10/09/2024 7.1  6.0 - 8.3 g/dL Final   Albumin 98/85/7973 4.1  3.5 - 5.2 g/dL Final   GFR 98/85/7973 93.22  >60.00 mL/min Final   Calculated using the CKD-EPI Creatinine Equation (2021)   Calcium  10/09/2024 9.8  8.4 - 10.5 mg/dL Final   WBC 98/85/7973 5.0  4.0 - 10.5 K/uL Final   RBC 10/09/2024 4.44  4.22 - 5.81 Mil/uL Final   Hemoglobin 10/09/2024 13.0  13.0 - 17.0 g/dL Final   HCT 98/85/7973 37.1 (L)  39.0 - 52.0 % Final   MCV 10/09/2024 83.5  78.0 - 100.0 fl Final   MCHC 10/09/2024 34.9  30.0 - 36.0 g/dL Final   RDW 98/85/7973 14.5  11.5 - 15.5 % Final   Platelets 10/09/2024 259.0  150.0 - 400.0 K/uL Final   Neutrophils Relative % 10/09/2024 63.3  43.0 - 77.0 % Final   Lymphocytes Relative 10/09/2024 20.5  12.0 - 46.0 % Final   Monocytes Relative 10/09/2024 10.2  3.0 - 12.0 % Final   Eosinophils Relative 10/09/2024 4.9  0.0 - 5.0 % Final   Basophils Relative 10/09/2024 1.1  0.0 - 3.0 % Final   Neutro Abs 10/09/2024 3.2  1.4 - 7.7 K/uL Final   Lymphs Abs 10/09/2024 1.0  0.7 - 4.0 K/uL Final   Monocytes Absolute 10/09/2024 0.5  0.1 - 1.0 K/uL Final   Eosinophils Absolute 10/09/2024 0.2  0.0 - 0.7 K/uL Final   Basophils Absolute 10/09/2024 0.1  0.0 - 0.1 K/uL Final  Admission on 10/03/2024, Discharged on 10/03/2024   Component Date Value Ref Range Status   Specimen Description 10/03/2024    Final                   Value:FLUID Performed at [facility], [address] [place], [place] [address]    Special Requests 10/03/2024    Final                   Value:NONE Performed at [facility], [address] [place], [place] [address]    Gram Stain 10/03/2024    Final                   Value:NO WBC SEEN NO ORGANISMS SEEN    Culture 10/03/2024    Final                   Value:RARE STAPHYLOCOCCUS AUREUS NO ANAEROBES ISOLATED Performed at [facility], [address] [place], [place] [address]    Report Status 10/03/2024 10/08/2024 FINAL   Final   Organism ID, Bacteria 10/03/2024 STAPHYLOCOCCUS AUREUS   Final  Office Visit on 09/30/2024  Component Date Value Ref Range Status   MICRO NUMBER: 09/30/2024 [id number]   Final   SPECIMEN QUALITY: 09/30/2024 Adequate   Final   SOURCE: 09/30/2024 LEFT ANTERIOR KNEE   Final   STATUS: 09/30/2024 FINAL   Final   AER ISOLATE 1: 09/30/2024 Staphylococcus aureus (A)   Final   Scant growth of Staphylococcus aureus Negative for inducible clindamycin   resistance.  Admission on 09/14/2024, Discharged on 09/14/2024  Component Date Value Ref Range Status   Color, Urine 09/14/2024 YELLOW  YELLOW Final   APPearance 09/14/2024 CLOUDY (A)  CLEAR Final   Specific Gravity, Urine 09/14/2024 1.004 (L)  1.005 - 1.030 Final   pH 09/14/2024 7.0  5.0 - 8.0 Final   Glucose, UA 09/14/2024 NEGATIVE  NEGATIVE mg/dL Final   Hgb urine dipstick 09/14/2024 MODERATE (A)  NEGATIVE Final   Bilirubin Urine 09/14/2024 NEGATIVE  NEGATIVE Final   Ketones, ur 09/14/2024 NEGATIVE  NEGATIVE mg/dL Final   Protein, ur 87/79/7974 NEGATIVE  NEGATIVE mg/dL Final   Nitrite 87/79/7974 NEGATIVE  NEGATIVE Final   Leukocytes,Ua 09/14/2024 LARGE (A)  NEGATIVE Final   RBC / HPF 09/14/2024 11-20  0 - 5 RBC/hpf Final   WBC, UA 09/14/2024 >50  0 - 5 WBC/hpf Final    Bacteria, UA 09/14/2024 RARE (A)  NONE SEEN Final   Squamous Epithelial / HPF 09/14/2024 0-5  0 - 5 /HPF Final   WBC Clumps 09/14/2024 PRESENT   Final   Non Squamous Epithelial 09/14/2024 0-5 (A)  NONE SEEN Final   Performed at [facility], [address] [place], [place] [address]   Sodium 09/14/2024 132 (L)  135 - 145 mmol/L Final   Potassium 09/14/2024 4.0  3.5 - 5.1 mmol/L Final   Chloride 09/14/2024 97 (L)  98 - 111 mmol/L Final   CO2 09/14/2024 23  22 - 32 mmol/L Final   Glucose, Bld 09/14/2024 103 (H)  70 - 99 mg/dL Final   Glucose reference range applies only to samples taken after fasting for at least 8 hours.   BUN 09/14/2024 6 (L)  8 - 23 mg/dL Final   Creatinine, Ser 09/14/2024 0.65  0.61 - 1.24 mg/dL Final   Calcium  09/14/2024 9.7  8.9 - 10.3 mg/dL Final   GFR, Estimated 09/14/2024 >60  >60 mL/min Final   Comment: (NOTE) Calculated using the CKD-EPI Creatinine Equation (2021)    Anion gap 09/14/2024 11  5 - 15 Final   Performed at [facility], [address] [place], [place] [address]   WBC 09/14/2024 10.0  4.0 - 10.5 K/uL Final   RBC 09/14/2024 4.57  4.22 - 5.81 MIL/uL Final   Hemoglobin 09/14/2024 13.3  13.0 - 17.0 g/dL Final   HCT 87/79/7974 38.1 (L)  39.0 - 52.0 % Final   MCV 09/14/2024 83.4  80.0 - 100.0 fL Final   MCH 09/14/2024 29.1  26.0 - 34.0 pg Final   MCHC 09/14/2024 34.9  30.0 - 36.0 g/dL Final   RDW 87/79/7974 14.0  11.5 - 15.5 % Final   Platelets 09/14/2024 189  150 - 400 K/uL Final   nRBC 09/14/2024 0.0  0.0 - 0.2 % Final   Performed at [facility], [address], [place], [place] [address]   Specimen Description 09/14/2024    Final                   Value:URINE, CLEAN CATCH Performed at [facility], [address] [place], [place] [address]    Special Requests 09/14/2024    Final                   Value:NONE Performed at [facility], [address], [place], [place] [address]    Culture 09/14/2024 >=100,000 COLONIES/mL PSEUDOMONAS AERUGINOSA (A)   Final   Report Status 09/14/2024 09/17/2024  FINAL   Final   Organism ID, Bacteria 09/14/2024 PSEUDOMONAS AERUGINOSA (A)   Final  Hospital Outpatient Visit on 07/11/2024  Component Date Value Ref Range Status   WBC 07/11/2024 4.5  4.0 - 10.5 K/uL Final   RBC 07/11/2024 4.76  4.22 - 5.81 MIL/uL Final   Hemoglobin 07/11/2024 14.4  13.0 - 17.0 g/dL Final   HCT 89/83/7974 40.6  39.0 - 52.0 % Final   MCV 07/11/2024 85.3  80.0 - 100.0 fL Final   MCH 07/11/2024 30.3  26.0 - 34.0 pg Final   MCHC 07/11/2024 35.5  30.0 - 36.0 g/dL Final   RDW 89/83/7974 12.7  11.5 - 15.5 % Final   Platelets 07/11/2024 201  150 - 400 K/uL Final   nRBC 07/11/2024 0.0  0.0 - 0.2 % Final   Performed at [facility], [address], [place], [place] [address]   Prothrombin Time 07/11/2024 12.9  11.4 - 15.2 seconds Final   INR 07/11/2024 0.9  0.8 - 1.2 Final   Comment: (NOTE) INR goal varies based on device and disease states. Performed at [facility], [address], [place], [place] [address]    Sodium 07/11/2024 134 (L)  135 - 145 mmol/L Final   Potassium 07/11/2024 4.1  3.5 - 5.1 mmol/L Final   Chloride 07/11/2024 99  98 - 111 mmol/L Final   CO2 07/11/2024 23  22 - 32 mmol/L Final   Glucose, Bld 07/11/2024 97  70 - 99 mg/dL Final   Glucose reference range applies only to samples taken after fasting for at least 8 hours.   BUN 07/11/2024 5 (L)  8 - 23 mg/dL Final   Creatinine, Ser 07/11/2024 0.70  0.61 - 1.24 mg/dL Final   Calcium  07/11/2024 9.7  8.9 - 10.3 mg/dL Final   GFR, Estimated 07/11/2024 >60  >60 mL/min Final   Comment: (NOTE) Calculated using the CKD-EPI Creatinine Equation (2021)    Anion gap 07/11/2024 12  5 - 15 Final   Performed at [facility], [address] [place], [place] [address]   Glucose-Capillary 07/11/2024 100 (H)  70 - 99 mg/dL Final   Glucose reference  range applies only to samples taken after fasting for at least 8 hours.  Office Visit on 05/29/2024  Component Date Value Ref Range Status   Color, UA 05/29/2024 yellow   Final   Clarity, UA 05/29/2024 cloudy   Final   Glucose, UA 05/29/2024 Negative  Negative Final   Bilirubin, UA 05/29/2024 neg   Final   Ketones, UA 05/29/2024 neg   Final   Spec Grav, UA 05/29/2024 1.010  1.010 - 1.025 Final   Blood, UA 05/29/2024 10 ery/uL   Final   pH, UA 05/29/2024 6.0  5.0 - 8.0 Final   Protein, UA 05/29/2024 Negative  Negative Final   Urobilinogen, UA 05/29/2024 0.2  0.2 or 1.0 E.U./dL Final   Nitrite, UA 90/96/7974 neg   Final   Leukocytes, UA 05/29/2024 Large (3+) (A)  Negative Final   WBC 05/29/2024 3.5 (L)  4.0 - 10.5 K/uL Final   RBC 05/29/2024 4.44  4.22 - 5.81 Mil/uL Final   Hemoglobin 05/29/2024 14.0  13.0 - 17.0 g/dL Final   HCT 90/96/7974 41.1  39.0 - 52.0 % Final   MCV 05/29/2024 92.5  78.0 - 100.0 fl Final   MCHC 05/29/2024 34.1  30.0 - 36.0 g/dL Final   RDW 90/96/7974 13.4  11.5 - 15.5 % Final  Platelets 05/29/2024 189.0  150.0 - 400.0 K/uL Final   Neutrophils Relative % 05/29/2024 65.3  43.0 - 77.0 % Final   Lymphocytes Relative 05/29/2024 20.9  12.0 - 46.0 % Final   Monocytes Relative 05/29/2024 10.9  3.0 - 12.0 % Final   Eosinophils Relative 05/29/2024 2.2  0.0 - 5.0 % Final   Basophils Relative 05/29/2024 0.7  0.0 - 3.0 % Final   Neutro Abs 05/29/2024 2.3  1.4 - 7.7 K/uL Final   Lymphs Abs 05/29/2024 0.7  0.7 - 4.0 K/uL Final   Monocytes Absolute 05/29/2024 0.4  0.1 - 1.0 K/uL Final   Eosinophils Absolute 05/29/2024 0.1  0.0 - 0.7 K/uL Final   Basophils Absolute 05/29/2024 0.0  0.0 - 0.1 K/uL Final   Sodium 05/29/2024 135  135 - 145 mEq/L Final   Potassium 05/29/2024 3.8  3.5 - 5.1 mEq/L Final   Chloride 05/29/2024 102  96 - 112 mEq/L Final   CO2 05/29/2024 27  19 - 32 mEq/L Final   Glucose, Bld 05/29/2024 85  70 - 99 mg/dL Final   BUN 90/96/7974 7  6 - 23 mg/dL Final    Creatinine, Ser 05/29/2024 0.66  0.40 - 1.50 mg/dL Final   GFR 90/96/7974 93.88  >60.00 mL/min Final   Calculated using the CKD-EPI Creatinine Equation (2021)   Calcium  05/29/2024 9.3  8.4 - 10.5 mg/dL Final   MICRO NUMBER: 90/96/7974 [id number]   Final   SPECIMEN QUALITY: 05/29/2024 Adequate   Final   Sample Source 05/29/2024 URINE   Final   STATUS: 05/29/2024 FINAL   Final   ISOLATE 1: 05/29/2024 Enterococcus faecalis (A)   Final   Greater than 100,000 CFU/mL of Enterococcus faecalis  Office Visit on 04/30/2024  Component Date Value Ref Range Status   WBC 04/30/2024 4.8  4.0 - 10.5 K/uL Final   RBC 04/30/2024 4.37  4.22 - 5.81 Mil/uL Final   Hemoglobin 04/30/2024 13.8  13.0 - 17.0 g/dL Final   HCT 91/94/7974 40.3  39.0 - 52.0 % Final   MCV 04/30/2024 92.3  78.0 - 100.0 fl Final   MCHC 04/30/2024 34.3  30.0 - 36.0 g/dL Final   RDW 91/94/7974 13.5  11.5 - 15.5 % Final   Platelets 04/30/2024 273.0  150.0 - 400.0 K/uL Final   Neutrophils Relative % 04/30/2024 69.0  43.0 - 77.0 % Final   Lymphocytes Relative 04/30/2024 19.8  12.0 - 46.0 % Final   Monocytes Relative 04/30/2024 7.8  3.0 - 12.0 % Final   Eosinophils Relative 04/30/2024 2.2  0.0 - 5.0 % Final   Basophils Relative 04/30/2024 1.2  0.0 - 3.0 % Final   Neutro Abs 04/30/2024 3.3  1.4 - 7.7 K/uL Final   Lymphs Abs 04/30/2024 0.9  0.7 - 4.0 K/uL Final   Monocytes Absolute 04/30/2024 0.4  0.1 - 1.0 K/uL Final   Eosinophils Absolute 04/30/2024 0.1  0.0 - 0.7 K/uL Final   Basophils Absolute 04/30/2024 0.1  0.0 - 0.1 K/uL Final   Magnesium  04/30/2024 2.0  1.5 - 2.5 mg/dL Final   Sodium 91/94/7974 135  135 - 145 mEq/L Final   Potassium 04/30/2024 4.2  3.5 - 5.1 mEq/L Final   Chloride 04/30/2024 98  96 - 112 mEq/L Final   CO2 04/30/2024 29  19 - 32 mEq/L Final   Glucose, Bld 04/30/2024 87  70 - 99 mg/dL Final   BUN 91/94/7974 6  6 - 23 mg/dL Final   Creatinine, Ser 04/30/2024 0.65  0.40 - 1.50 mg/dL Final   Total Bilirubin 04/30/2024  0.6  0.2 - 1.2 mg/dL Final   Alkaline Phosphatase 04/30/2024 71  39 - 117 U/L Final   AST 04/30/2024 12  0 - 37 U/L Final   ALT 04/30/2024 21  0 - 53 U/L Final   Total Protein 04/30/2024 6.7  6.0 - 8.3 g/dL Final   Albumin 91/94/7974 4.4  3.5 - 5.2 g/dL Final   GFR 91/94/7974 94.37  >60.00 mL/min Final   Calculated using the CKD-EPI Creatinine Equation (2021)   Calcium  04/30/2024 9.9  8.4 - 10.5 mg/dL Final   Cholesterol 91/94/7974 160  0 - 200 mg/dL Final   ATP III Classification       Desirable:  < 200 mg/dL               Borderline High:  200 - 239 mg/dL          High:  > = 759 mg/dL   Triglycerides 91/94/7974 118.0  0.0 - 149.0 mg/dL Final   Normal:  <849 mg/dLBorderline High:  150 - 199 mg/dL   HDL 91/94/7974 43.99  >39.00 mg/dL Final   VLDL 91/94/7974 23.6  0.0 - 40.0 mg/dL Final   LDL Cholesterol 04/30/2024 81  0 - 99 mg/dL Final   Total CHOL/HDL Ratio 04/30/2024 3   Final                  Men          Women1/2 Average Risk     3.4          3.3Average Risk          5.0          4.42X Average Risk          9.6          7.13X Average Risk          15.0          11.0                       NonHDL 04/30/2024 104.47   Final   NOTE:  Non-HDL goal should be 30 mg/dL higher than patient's LDL goal (i.e. LDL goal of < 70 mg/dL, would have non-HDL goal of < 100 mg/dL)   PSA 04/30/2024 11.79 (H)  0.10 - 4.00 ng/mL Final   Test performed using Access [employer] PSA Assay, a parmagnetic partical, chemiluminecent immunoassay.   TSH 04/30/2024 0.83  0.35 - 5.50 uIU/mL Final   Vitamin B-12 04/30/2024 653  211 - 911 pg/mL Final   Vitamin B1 (Thiamine ) 04/30/2024 28  8 - 30 nmol/L Final   Comment: (Note) Vitamin supplementation within 24 hours prior to blood  draw may affect the accuracy of the results. . This test was developed and its analytical performance  characteristics have been determined by [employer]. It has not been cleared or approved by FDA.  This assay has been validated  pursuant to the CLIA  regulations and is used for clinical purposes. . [facility] [address] [place] [address] [phone] Johanna Agent L. Frame, MD, PhD    VITD 04/30/2024 70.02  30.00 - 100.00 ng/mL Final   Valproic Acid  Lvl 04/30/2024 48.8 (L)  50.0 - 100.0 mg/L Final   QUESTION/PROBLEM: 04/30/2024    Final   Comment: . Please clarify the following specimen source/type submitted. .    QUESTION: 04/30/2024  VERIFY SOURCE OF OT   Final   Comment: REQUESTED INFORMATION _________________________________ . AUTHORIZED SIGNATURE __________________________________ . TO PREVENT FURTHER DELAYS IN TESTING, PLEASE COMPLETE INFORMATION ABOVE AND EITHER FAX TO [phone] OR  EMAIL TO [email or web address] TO  RESOLVE THIS ORDER.   Admission on 04/27/2024, Discharged on 04/27/2024  Component Date Value Ref Range Status   Specimen Source 04/27/2024 URINE, CATHETERIZED   Final   Color, Urine 04/27/2024 YELLOW  YELLOW Final   APPearance 04/27/2024 CLEAR  CLEAR Final   Specific Gravity, Urine 04/27/2024 1.013  1.005 - 1.030 Final   pH 04/27/2024 6.0  5.0 - 8.0 Final   Glucose, UA 04/27/2024 NEGATIVE  NEGATIVE mg/dL Final   Hgb urine dipstick 04/27/2024 SMALL (A)  NEGATIVE Final   Bilirubin Urine 04/27/2024 NEGATIVE  NEGATIVE Final   Ketones, ur 04/27/2024 5 (A)  NEGATIVE mg/dL Final   Protein, ur 91/97/7974 NEGATIVE  NEGATIVE mg/dL Final   Nitrite 91/97/7974 NEGATIVE  NEGATIVE Final   Leukocytes,Ua 04/27/2024 NEGATIVE  NEGATIVE Final   RBC / HPF 04/27/2024 21-50  0 - 5 RBC/hpf Final   WBC, UA 04/27/2024 0-5  0 - 5 WBC/hpf Final   Comment:        Reflex urine culture not performed if WBC <=10, OR if Squamous epithelial cells >5. If Squamous epithelial cells >5 suggest recollection.    Bacteria, UA 04/27/2024 NONE SEEN  NONE SEEN Final   Squamous Epithelial / HPF 04/27/2024 0-5  0 - 5 /HPF Final   Mucus 04/27/2024 PRESENT   Final    Performed at [facility], [address], [place], [place] [address]  Admission on 04/20/2024, Discharged on 04/22/2024  Component Date Value Ref Range Status   WBC 04/20/2024 7.5  4.0 - 10.5 K/uL Final   RBC 04/20/2024 4.22  4.22 - 5.81 MIL/uL Final   Hemoglobin 04/20/2024 13.7  13.0 - 17.0 g/dL Final   HCT 92/73/7974 36.9 (L)  39.0 - 52.0 % Final   MCV 04/20/2024 87.4  80.0 - 100.0 fL Final   MCH 04/20/2024 32.5  26.0 - 34.0 pg Final   MCHC 04/20/2024 37.1 (H)  30.0 - 36.0 g/dL Final   RDW 92/73/7974 12.8  11.5 - 15.5 % Final   Platelets 04/20/2024 170  150 - 400 K/uL Final   nRBC 04/20/2024 0.0  0.0 - 0.2 % Final   Neutrophils Relative % 04/20/2024 80  % Final   Neutro Abs 04/20/2024 6.0  1.7 - 7.7 K/uL Final   Lymphocytes Relative 04/20/2024 8  % Final   Lymphs Abs 04/20/2024 0.6 (L)  0.7 - 4.0 K/uL Final   Monocytes Relative 04/20/2024 11  % Final   Monocytes Absolute 04/20/2024 0.9  0.1 - 1.0 K/uL Final   Eosinophils Relative 04/20/2024 0  % Final   Eosinophils Absolute 04/20/2024 0.0  0.0 - 0.5 K/uL Final   Basophils Relative 04/20/2024 0  % Final   Basophils Absolute 04/20/2024 0.0  0.0 - 0.1 K/uL Final   Immature Granulocytes 04/20/2024 1  % Final   Abs Immature Granulocytes 04/20/2024 0.05  0.00 - 0.07 K/uL Final   Performed at [facility], [address], [place], [place] [address]   Sodium 04/20/2024 123 (L)  135 - 145 mmol/L Final   Potassium 04/20/2024 3.9  3.5 - 5.1 mmol/L Final   Chloride 04/20/2024 87 (L)  98 - 111 mmol/L Final   CO2 04/20/2024 20 (L)  22 - 32 mmol/L Final   Glucose, Bld  04/20/2024 115 (H)  70 - 99 mg/dL Final   Glucose reference range applies only to samples taken after fasting for at least 8 hours.   BUN 04/20/2024 6 (L)  8 - 23 mg/dL Final   Creatinine, Ser 04/20/2024 0.78  0.61 - 1.24 mg/dL Final   Calcium  04/20/2024 9.9  8.9 - 10.3 mg/dL Final   Total Protein 92/73/7974 7.4  6.5 - 8.1 g/dL Final    Albumin 92/73/7974 4.6  3.5 - 5.0 g/dL Final   AST 92/73/7974 30  15 - 41 U/L Final   ALT 04/20/2024 43  0 - 44 U/L Final   Alkaline Phosphatase 04/20/2024 131 (H)  38 - 126 U/L Final   Total Bilirubin 04/20/2024 0.9  0.0 - 1.2 mg/dL Final   GFR, Estimated 04/20/2024 >60  >60 mL/min Final   Comment: (NOTE) Calculated using the CKD-EPI Creatinine Equation (2021)    Anion gap 04/20/2024 16 (H)  5 - 15 Final   Performed at [facility], [address], [place], [place] [address]   Lipase 04/20/2024 16  11 - 51 U/L Final   Performed at [facility], [address] [place], [place] [address]   Color, Urine 04/20/2024 YELLOW  YELLOW Final   APPearance 04/20/2024 CLEAR  CLEAR Final   Specific Gravity, Urine 04/20/2024 1.009  1.005 - 1.030 Final   pH 04/20/2024 7.0  5.0 - 8.0 Final   Glucose, UA 04/20/2024 NEGATIVE  NEGATIVE mg/dL Final   Hgb urine dipstick 04/20/2024 NEGATIVE  NEGATIVE Final   Bilirubin Urine 04/20/2024 NEGATIVE  NEGATIVE Final   Ketones, ur 04/20/2024 TRACE (A)  NEGATIVE mg/dL Final   Protein, ur 92/73/7974 NEGATIVE  NEGATIVE mg/dL Final   Nitrite 92/73/7974 NEGATIVE  NEGATIVE Final   Leukocytes,Ua 04/20/2024 MODERATE (A)  NEGATIVE Final   RBC / HPF 04/20/2024 0-5  0 - 5 RBC/hpf Final   WBC, UA 04/20/2024 21-50  0 - 5 WBC/hpf Final   Bacteria, UA 04/20/2024 RARE (A)  NONE SEEN Final   Squamous Epithelial / HPF 04/20/2024 0-5  0 - 5 /HPF Final   Performed at [facility], [address], [place], [place] [address]   WBC 04/21/2024 5.2  4.0 - 10.5 K/uL Final   RBC 04/21/2024 3.97 (L)  4.22 - 5.81 MIL/uL Final   Hemoglobin 04/21/2024 12.7 (L)  13.0 - 17.0 g/dL Final   HCT 92/72/7974 36.2 (L)  39.0 - 52.0 % Final   MCV 04/21/2024 91.2  80.0 - 100.0 fL Final   MCH 04/21/2024 32.0  26.0 - 34.0 pg Final   MCHC 04/21/2024 35.1  30.0 - 36.0 g/dL Final   RDW 92/72/7974 12.9  11.5 - 15.5 % Final   Platelets 04/21/2024  163  150 - 400 K/uL Final   nRBC 04/21/2024 0.0  0.0 - 0.2 % Final   Performed at [facility], [address], [place], [place] [address]   Sodium 04/21/2024 131 (L)  135 - 145 mmol/L Final   Potassium 04/21/2024 3.9  3.5 - 5.1 mmol/L Final   Chloride 04/21/2024 101  98 - 111 mmol/L Final   CO2 04/21/2024 21 (L)  22 - 32 mmol/L Final   Glucose, Bld 04/21/2024 92  70 - 99 mg/dL Final   Glucose reference range applies only to samples taken after fasting for at least 8 hours.   BUN 04/21/2024 6 (L)  8 - 23 mg/dL Final   Creatinine, Ser 04/21/2024 0.75  0.61 -  1.24 mg/dL Final   Calcium  04/21/2024 8.7 (L)  8.9 - 10.3 mg/dL Final   Total Protein 92/72/7974 6.2 (L)  6.5 - 8.1 g/dL Final   Albumin 92/72/7974 3.2 (L)  3.5 - 5.0 g/dL Final   AST 92/72/7974 41  15 - 41 U/L Final   ALT 04/21/2024 46 (H)  0 - 44 U/L Final   Alkaline Phosphatase 04/21/2024 89  38 - 126 U/L Final   Total Bilirubin 04/21/2024 1.1  0.0 - 1.2 mg/dL Final   GFR, Estimated 04/21/2024 >60  >60 mL/min Final   Comment: (NOTE) Calculated using the CKD-EPI Creatinine Equation (2021)    Anion gap 04/21/2024 9  5 - 15 Final   Performed at [facility], [address] [place], [place] [address]   Sodium 04/20/2024 131 (L)  135 - 145 mmol/L Final   Potassium 04/20/2024 3.5  3.5 - 5.1 mmol/L Final   Chloride 04/20/2024 97 (L)  98 - 111 mmol/L Final   CO2 04/20/2024 22  22 - 32 mmol/L Final   Glucose, Bld 04/20/2024 120 (H)  70 - 99 mg/dL Final   Glucose reference range applies only to samples taken after fasting for at least 8 hours.   BUN 04/20/2024 6 (L)  8 - 23 mg/dL Final   Creatinine, Ser 04/20/2024 0.68  0.61 - 1.24 mg/dL Final   Calcium  04/20/2024 9.3  8.9 - 10.3 mg/dL Final   GFR, Estimated 04/20/2024 >60  >60 mL/min Final   Comment: (NOTE) Calculated using the CKD-EPI Creatinine Equation (2021)    Anion gap 04/20/2024 12  5 - 15 Final   Performed at [facility], [address], [place], [place] [address]   Magnesium  04/21/2024 2.1  1.7 - 2.4 mg/dL Final   Performed at [facility], [address] [place], [place] [address]   WBC 04/22/2024 4.8  4.0 - 10.5 K/uL Final   RBC 04/22/2024 4.00 (L)  4.22 - 5.81 MIL/uL Final   Hemoglobin 04/22/2024 12.9 (L)  13.0 - 17.0 g/dL Final   HCT 92/71/7974 37.2 (L)  39.0 - 52.0 % Final   MCV 04/22/2024 93.0  80.0 - 100.0 fL Final   MCH 04/22/2024 32.3  26.0 - 34.0 pg Final   MCHC 04/22/2024 34.7  30.0 - 36.0 g/dL Final   RDW 92/71/7974 13.2  11.5 - 15.5 % Final   Platelets 04/22/2024 177  150 - 400 K/uL Final   nRBC 04/22/2024 0.0  0.0 - 0.2 % Final   Performed at [facility], [address] [place], [place] [address]   Sodium 04/22/2024 133 (L)  135 - 145 mmol/L Final   Potassium 04/22/2024 3.8  3.5 - 5.1 mmol/L Final   Chloride 04/22/2024 103  98 - 111 mmol/L Final   CO2 04/22/2024 20 (L)  22 - 32 mmol/L Final   Glucose, Bld 04/22/2024 96  70 - 99 mg/dL Final   Glucose reference range applies only to samples taken after fasting for at least 8 hours.   BUN 04/22/2024 9  8 - 23 mg/dL Final   Creatinine, Ser 04/22/2024 0.66  0.61 - 1.24 mg/dL Final   Calcium  04/22/2024 8.8 (L)  8.9 - 10.3 mg/dL Final   GFR, Estimated 04/22/2024 >60  >60 mL/min Final   Comment: (NOTE) Calculated using the CKD-EPI Creatinine Equation (2021)    Anion gap 04/22/2024 10  5 - 15 Final   Performed at [facility],  [address], [place], [place] [address]   Magnesium  04/22/2024 2.1  1.7 - 2.4 mg/dL Final   Performed at [facility], [address], [place], [place] [address]  Admission on 04/19/2024, Discharged on 04/19/2024  Component Date Value Ref Range Status   Color, UA 04/19/2024 yellow  yellow Final   Clarity, UA 04/19/2024 cloudy (A)  clear Final   Glucose, UA 04/19/2024 negative  negative mg/dL Final   Bilirubin, UA 92/74/7974  negative  negative Final   Ketones, POC UA 04/19/2024 negative  negative mg/dL Final   Spec Grav, UA 92/74/7974 1.015  1.010 - 1.025 Final   Blood, UA 04/19/2024 small (A)  negative Final   pH, UA 04/19/2024 8.0  5.0 - 8.0 Final   Protein Ur, POC 04/19/2024 trace (A)  negative mg/dL Final   Urobilinogen, UA 04/19/2024 1.0  0.2 or 1.0 E.U./dL Final   Nitrite, UA 92/74/7974 Negative  Negative Final   Leukocytes, UA 04/19/2024 Moderate (2+) (A)  Negative Final   Specimen Description 04/19/2024 URINE, CLEAN CATCH   Final   Special Requests 04/19/2024    Final                   Value:NONE Performed at [facility], [address], [place], [place] [address]    Culture 04/19/2024 60,000 COLONIES/mL ENTEROCOCCUS FAECALIS (A)   Final   Report Status 04/19/2024 04/21/2024 FINAL   Final   Organism ID, Bacteria 04/19/2024 ENTEROCOCCUS FAECALIS (A)   Final  There may be more visits with results that are not included.    Allergies: Poison oak extract  Medications:  Facility Ordered Medications  Medication   acetaminophen  (TYLENOL ) tablet 650 mg   alum & mag hydroxide-simeth (MAALOX/MYLANTA) 200-200-20 MG/5ML suspension 30 mL   magnesium  hydroxide (MILK OF MAGNESIA) suspension 30 mL   OLANZapine  zydis (ZYPREXA ) disintegrating tablet 5 mg   OLANZapine  (ZYPREXA ) injection 5 mg   [START ON 10/14/2024] amLODipine  (NORVASC ) tablet 2.5 mg   [START ON 10/14/2024] aspirin  chewable tablet 81 mg   benztropine  (COGENTIN ) tablet 0.5 mg   [START ON 10/14/2024] calcium  citrate (CALCITRATE - dosed in mg elemental calcium ) tablet 950 mg   [START ON 10/14/2024] cholecalciferol  (VITAMIN D3) 25 MCG (1000 UNIT) tablet 1,000 Units   divalproex  (DEPAKOTE  ER) 24 hr tablet 750 mg   [START ON 10/14/2024] FLUoxetine  (PROZAC ) capsule 20 mg   hydrOXYzine  (ATARAX ) tablet 25 mg   OLANZapine  zydis (ZYPREXA ) disintegrating tablet 10 mg   [START ON 10/14/2024] OLANZapine  zydis (ZYPREXA ) disintegrating tablet 5 mg    tamsulosin  (FLOMAX ) capsule 0.4 mg   PTA Medications  Medication Sig   aspirin  81 MG tablet Take 81 mg by mouth daily.   benztropine  (COGENTIN ) 0.5 MG tablet Take 0.5 mg by mouth at bedtime.   acetaminophen  (TYLENOL ) 500 MG tablet Take 1,000 mg by mouth at bedtime.   divalproex  (DEPAKOTE  ER) 250 MG 24 hr tablet Take 750 mg by mouth at bedtime.   FLUoxetine  (PROZAC ) 20 MG capsule Take 20 mg by mouth in the morning.   hydrOXYzine  (ATARAX /VISTARIL ) 25 MG tablet Take 25 mg by mouth 2 (two) times daily as needed for anxiety.   Calcium  Citrate 333 MG TABS Take 1 tablet by mouth daily.   Cholecalciferol  (VITAMIN D3 PO) Take 1 capsule by mouth in the morning.   OLANZapine  zydis (ZYPREXA ) 5 MG disintegrating tablet Take 5 mg by mouth See admin instructions. Dissolve 5 mg in the mouth in the morning  OLANZapine  zydis (ZYPREXA ) 10 MG disintegrating tablet Take 10 mg by mouth See admin instructions. Dissolve 10 mg in the mouth at bedtime   tadalafil  (CIALIS ) 5 MG tablet Take 1 tablet (5 mg total) by mouth daily.   amLODipine  (NORVASC ) 2.5 MG tablet Take 1 tablet (2.5 mg total) by mouth daily.   tamsulosin  (FLOMAX ) 0.4 MG CAPS capsule Take 1 capsule (0.4 mg total) by mouth daily.   B Complex Vitamins (VITAMIN B COMPLEX  PO) Take 1 capsule by mouth every other day.   clindamycin  (CLEOCIN ) 300 MG capsule Take 1 capsule (300 mg total) by mouth 3 (three) times daily.   amoxicillin -clavulanate (AUGMENTIN ) 875-125 MG tablet Take 1 tablet by mouth 2 (two) times daily.      Medical Decision Making  Pt is a 74 yr old male with psychiatric history of paranoid schizophrenia and bipolar disorder. He reports hx of multiple inpatient admissions years ago including [facility] in the 90s.  He was last admitted to [facility] in 2018. He has otherwise been stable on current medication regimen with outpatient treatment for years up until recently. Pt presented to the [facility] with manic symptoms and feels that he is having  a manic episode. Pt did recently have an I&D on left knee abscess on 10/03/24. He completed abx regimen today. Due to have sutures removed on 10/16/24, per note. Pt also has a hx of urinary retention r/t BHP but had a prostatic artery embolization and has improved.    Pt presents with and reports symptoms that are consistent with Bipolar 1 disorder, current manic episode including insomnia, hyperactivity increased energy,euphoria, increase in sexual impulses (watching porn a lot but not behaviorally inappropriate), pressured/ rapid/ hyper verbal speech, tangential though process and delusions. Due to current presentation, pt is recommended for inpatient psychiatric hospitalization for mood stabilization and medication management.   Treatment Plan: - Admit to observation unit until appropriate gero bed is found.  - Initiate gero agitation protocol.  - Labs, EKG, UA, COVID, valproic acid  level and UDS ordered   Medications - Continue amlodipine  2.5 mg daily for hypertension - Continue aspirin  81 mg daily - Continue benztropine  0.5 mg nightly for EPS prophylaxis - Continue Depakote  ER 750 mg nightly for mood stabilization - Continue hydroxyzine  25 mg 3 times daily as needed for anxiety - Continue olanzapine  10 mg nightly for psychotic symptoms/ mood - Continue olanzapine  5 mg daily for psychotic symptoms/mood - Continue tamsulosin  0.4 mg for BPH - Continue vitamin D3 1000 units daily for vitamin D  deficiency - Continue calcium  citrate 950 mg daily - Start antibiotic ointment 3 times daily applied to left knee wound - Hold Prozac  20 mg due to current manic symptoms    Recommendations  Based on my evaluation the patient does not appear to have an emergency medical condition.  Robert JAYSON Mcardle, NP 10/13/24  1:36 PM

## 2024-10-13 NOTE — ED Notes (Signed)
 Pt. Instructed on need for urine specimen.  He states that he can't urinate right now.  Pt provided with juice and water to drink along with his supper.

## 2024-10-13 NOTE — ED Notes (Signed)
 Patient continues to refuse to give urine.  Patient has many reasons not to give it.  He claims he has an enlarged prostate and that he needs salt in order to urinate.  Patient on phone complaining to family.  Support attempted however patients oppositional and difficult to engage due to being hyper verbal.

## 2024-10-13 NOTE — ED Notes (Signed)
"    Pt instructed on UDS/UA.  He has not been able to urinate.  He is insisting that he has to have sodium or he won't be able to urinate.  Pt has been given the urine container and he attempted to void.   After that he came back out and continued to tell another patient about his prostate concerns and that he needs to have salt.   Pt given message to call his sister and was over heard telling her that he will have to have a catheter, that he needs salt.    "
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
## 2024-10-13 NOTE — ED Notes (Signed)
 Pt sitting in bed talking to peers. No acute distress noted. Continue to monitor for safety.

## 2024-10-13 NOTE — ED Notes (Signed)
 Pt denies SI/HI/AVH. Lower abdominal pain managed with prn tylenol . Compliant with scheduled meds; prn atarax  given. He was anxious, cooperative, intrusive, hyperactive, restless, and pleasant. Pt is having difficulty urinating, NP aware. No behavioral concerns at this time. Pt resting in bed, no acute distress noted. Respirations even and unlabored. Continue to monitor for safety.

## 2024-10-13 NOTE — ED Notes (Addendum)
 Pt on BHUC for observation this PM.  He is pleasant, calm and cooperative.  He is able to participate in admission assessment with out any difficulty.  He states that he's been living alone at his own home.  Reports that his mother died last March 20, 2025.  He said that he has some brothers and sisters but he doesn't want to have anything to do with his brother because he puts him down  Denies SI/HI/AVH at this time; however, he did say that he had some visual hallucinations last night.  The legs on his TV were moving.   He reports that he's on a lot of medications.   Speech is rapid and thoughts tangential.  Denies any pain.

## 2024-10-13 NOTE — Progress Notes (Signed)
 Patient has been denied by [facility] due to no appropriate beds available. Patient meets BH inpatient criteria per Alan Mcardle, NP. Patient has been faxed out to the following facilities:   [facility]  [address] [place] [place] [address] [phone]  [facility]  [address] [place] [address] [phone]  [facility]  [address] [place], [place] [address] [phone]  [facility] Patient Placement  [facility] [place], [place] [phone]  [facility]  [address] [place] [address] [phone]  [facility]  [address] [place] [address] [phone]  [facility]  [address], [place] [address] [phone]  [facility]  [address] [place] [address] [phone]  [facility] EFAX  [address] [place], [place] [phone]  [facility]  [address] [place], [place] [address] [phone]  [facility]  [address] [place] [address] [phone]  [facility] [address]  [place] [address] [phone]  [facility]  [facility] [place] [address] [phone]  [facility]  [address] [place] [address] [phone]  [facility]  [address] [name] [address] [phone]  [facility]  [address] [place] [address] [phone]  [facility]  [address] [place] [address] [phone]  [facility]  [address] [place] [address] [phone]   Bunnie Gallop, MSW, LCSW-A  1:10 PM 10/13/2024

## 2024-10-14 ENCOUNTER — Encounter (HOSPITAL_COMMUNITY): Payer: Self-pay

## 2024-10-14 ENCOUNTER — Other Ambulatory Visit: Payer: Self-pay

## 2024-10-14 ENCOUNTER — Emergency Department (HOSPITAL_COMMUNITY)
Admission: EM | Admit: 2024-10-14 | Discharge: 2024-10-14 | Disposition: A | Discharge status: Other Acute Inpatient Hospital | Attending: Emergency Medicine | Admitting: Emergency Medicine

## 2024-10-14 ENCOUNTER — Ambulatory Visit (HOSPITAL_COMMUNITY): Admission: EM | Admit: 2024-10-14 | Discharge: 2024-10-16 | Disposition: A | Source: Intra-hospital

## 2024-10-14 DIAGNOSIS — N401 Enlarged prostate with lower urinary tract symptoms: Secondary | ICD-10-CM | POA: Insufficient documentation

## 2024-10-14 DIAGNOSIS — Z79899 Other long term (current) drug therapy: Secondary | ICD-10-CM | POA: Insufficient documentation

## 2024-10-14 DIAGNOSIS — F250 Schizoaffective disorder, bipolar type: Secondary | ICD-10-CM | POA: Insufficient documentation

## 2024-10-14 DIAGNOSIS — M810 Age-related osteoporosis without current pathological fracture: Secondary | ICD-10-CM | POA: Insufficient documentation

## 2024-10-14 DIAGNOSIS — R338 Other retention of urine: Secondary | ICD-10-CM | POA: Insufficient documentation

## 2024-10-14 DIAGNOSIS — Z7982 Long term (current) use of aspirin: Secondary | ICD-10-CM | POA: Insufficient documentation

## 2024-10-14 DIAGNOSIS — Z87891 Personal history of nicotine dependence: Secondary | ICD-10-CM | POA: Insufficient documentation

## 2024-10-14 DIAGNOSIS — I10 Essential (primary) hypertension: Secondary | ICD-10-CM | POA: Insufficient documentation

## 2024-10-14 DIAGNOSIS — F1210 Cannabis abuse, uncomplicated: Secondary | ICD-10-CM | POA: Insufficient documentation

## 2024-10-14 DIAGNOSIS — F0392 Unspecified dementia, unspecified severity, with psychotic disturbance: Secondary | ICD-10-CM | POA: Insufficient documentation

## 2024-10-14 DIAGNOSIS — E871 Hypo-osmolality and hyponatremia: Secondary | ICD-10-CM | POA: Insufficient documentation

## 2024-10-14 DIAGNOSIS — Z602 Problems related to living alone: Secondary | ICD-10-CM | POA: Insufficient documentation

## 2024-10-14 DIAGNOSIS — L0291 Cutaneous abscess, unspecified: Secondary | ICD-10-CM | POA: Insufficient documentation

## 2024-10-14 DIAGNOSIS — R339 Retention of urine, unspecified: Secondary | ICD-10-CM | POA: Insufficient documentation

## 2024-10-14 DIAGNOSIS — Z634 Disappearance and death of family member: Secondary | ICD-10-CM | POA: Insufficient documentation

## 2024-10-14 LAB — CBC WITH DIFFERENTIAL/PLATELET
Abs Immature Granulocytes: 0.02 K/uL (ref 0.00–0.07)
Basophils Absolute: 0 K/uL (ref 0.0–0.1)
Basophils Relative: 1 %
Eosinophils Absolute: 0.1 K/uL (ref 0.0–0.5)
Eosinophils Relative: 2 %
HCT: 37.4 % — ABNORMAL LOW (ref 39.0–52.0)
Hemoglobin: 12.9 g/dL — ABNORMAL LOW (ref 13.0–17.0)
Immature Granulocytes: 0 %
Lymphocytes Relative: 20 %
Lymphs Abs: 1.2 K/uL (ref 0.7–4.0)
MCH: 28.8 pg (ref 26.0–34.0)
MCHC: 34.5 g/dL (ref 30.0–36.0)
MCV: 83.5 fL (ref 80.0–100.0)
Monocytes Absolute: 0.6 K/uL (ref 0.1–1.0)
Monocytes Relative: 10 %
Neutro Abs: 4 K/uL (ref 1.7–7.7)
Neutrophils Relative %: 67 %
Platelets: 207 K/uL (ref 150–400)
RBC: 4.48 MIL/uL (ref 4.22–5.81)
RDW: 14.5 % (ref 11.5–15.5)
WBC: 6 K/uL (ref 4.0–10.5)
nRBC: 0 % (ref 0.0–0.2)

## 2024-10-14 LAB — URINALYSIS, ROUTINE W REFLEX MICROSCOPIC
Bilirubin Urine: NEGATIVE
Glucose, UA: NEGATIVE mg/dL
Hgb urine dipstick: NEGATIVE
Ketones, ur: 5 mg/dL — AB
Leukocytes,Ua: NEGATIVE
Nitrite: NEGATIVE
Protein, ur: NEGATIVE mg/dL
Specific Gravity, Urine: 1.015 (ref 1.005–1.030)
pH: 6 (ref 5.0–8.0)

## 2024-10-14 LAB — BASIC METABOLIC PANEL WITH GFR
Anion gap: 11 (ref 5–15)
BUN: 14 mg/dL (ref 8–23)
CO2: 24 mmol/L (ref 22–32)
Calcium: 9.6 mg/dL (ref 8.9–10.3)
Chloride: 98 mmol/L (ref 98–111)
Creatinine, Ser: 0.76 mg/dL (ref 0.61–1.24)
GFR, Estimated: >60 mL/min
Glucose, Bld: 103 mg/dL — ABNORMAL HIGH (ref 70–99)
Potassium: 4.1 mmol/L (ref 3.5–5.1)
Sodium: 133 mmol/L — ABNORMAL LOW (ref 135–145)

## 2024-10-14 LAB — POCT URINE DRUG SCREEN - MANUAL ENTRY (I-SCREEN)
POC Amphetamine UR: NOT DETECTED
POC Buprenorphine (BUP): NOT DETECTED
POC Cocaine UR: NOT DETECTED
POC Marijuana UR: POSITIVE — AB
POC Methadone UR: NOT DETECTED
POC Methamphetamine UR: NOT DETECTED
POC Morphine: NOT DETECTED
POC Oxazepam (BZO): NOT DETECTED
POC Oxycodone UR: NOT DETECTED
POC Secobarbital (BAR): NOT DETECTED

## 2024-10-14 MED ORDER — BENZTROPINE MESYLATE 0.5 MG PO TABS: 0.5000 mg | ORAL_TABLET | Freq: Every day | ORAL | Status: DC

## 2024-10-14 MED ORDER — TAMSULOSIN HCL 0.4 MG PO CAPS
0.4000 mg | ORAL_CAPSULE | Freq: Every day | ORAL | Status: DC
  Administered 2024-10-14 – 2024-10-16 (×3): 0.4 mg via ORAL
  Filled 2024-10-14 (×3): qty 1

## 2024-10-14 MED ORDER — HYDROXYZINE HCL 25 MG PO TABS: 25.0000 mg | ORAL_TABLET | Freq: Three times a day (TID) | ORAL | Status: DC | PRN

## 2024-10-14 MED ORDER — OLANZAPINE 10 MG PO TBDP: 10.0000 mg | ORAL_TABLET | Freq: Every day | ORAL | Status: DC

## 2024-10-14 MED ORDER — DIVALPROEX SODIUM ER 500 MG PO TB24
750.0000 mg | ORAL_TABLET | Freq: Every day | ORAL | Status: DC
  Administered 2024-10-14: 750 mg via ORAL
  Filled 2024-10-14: qty 1

## 2024-10-14 MED ORDER — OLANZAPINE 5 MG PO TBDP
5.0000 mg | ORAL_TABLET | Freq: Two times a day (BID) | ORAL | Status: DC
  Administered 2024-10-14 – 2024-10-15 (×2): 5 mg via ORAL
  Filled 2024-10-14 (×2): qty 1

## 2024-10-14 MED ORDER — ALUM & MAG HYDROXIDE-SIMETH 200-200-20 MG/5ML PO SUSP: 30.0000 mL | ORAL | Status: DC | PRN

## 2024-10-14 MED ORDER — OLANZAPINE 5 MG PO TBDP
5.0000 mg | ORAL_TABLET | Freq: Every day | ORAL | Status: DC
  Administered 2024-10-14: 5 mg via ORAL
  Filled 2024-10-14: qty 1

## 2024-10-14 MED ORDER — B COMPLEX-C PO TABS
1.0000 | ORAL_TABLET | ORAL | Status: DC
  Administered 2024-10-14 – 2024-10-16 (×2): 1 via ORAL
  Filled 2024-10-14: qty 1

## 2024-10-14 MED ORDER — OLANZAPINE 5 MG PO TBDP: 5.0000 mg | ORAL_TABLET | Freq: Three times a day (TID) | ORAL | Status: DC | PRN

## 2024-10-14 MED ORDER — CALCIUM CITRATE 333 MG PO TABS: 1.0000 | ORAL_TABLET | Freq: Every day | ORAL | Status: DC

## 2024-10-14 MED ORDER — VITAMIN D 25 MCG (1000 UNIT) PO TABS
1000.0000 [IU] | ORAL_TABLET | Freq: Every morning | ORAL | Status: DC
  Administered 2024-10-14 – 2024-10-16 (×3): 1000 [IU] via ORAL
  Filled 2024-10-14 (×3): qty 1

## 2024-10-14 MED ORDER — MAGNESIUM HYDROXIDE 400 MG/5ML PO SUSP: 30.0000 mL | Freq: Every day | ORAL | Status: DC | PRN

## 2024-10-14 MED ORDER — TRIPLE ANTIBIOTIC 3.5-400-5000 EX OINT
1.0000 | TOPICAL_OINTMENT | Freq: Once | CUTANEOUS | Status: DC
  Filled 2024-10-14: qty 1

## 2024-10-14 MED ORDER — ASPIRIN 81 MG PO CHEW
81.0000 mg | CHEWABLE_TABLET | Freq: Every day | ORAL | Status: DC
  Administered 2024-10-14 – 2024-10-16 (×3): 81 mg via ORAL
  Filled 2024-10-14 (×3): qty 1

## 2024-10-14 MED ORDER — OLANZAPINE 10 MG IM SOLR
5.0000 mg | Freq: Three times a day (TID) | INTRAMUSCULAR | Status: DC | PRN
  Administered 2024-10-16: 5 mg via INTRAMUSCULAR
  Filled 2024-10-14: qty 10

## 2024-10-14 MED ORDER — VITAMIN B COMPLEX PO CAPS: 1.0000 | ORAL_CAPSULE | ORAL | Status: DC

## 2024-10-14 MED ORDER — MELATONIN 5 MG PO TABS
5.0000 mg | ORAL_TABLET | Freq: Every day | ORAL | Status: DC
  Administered 2024-10-14 – 2024-10-15 (×2): 5 mg via ORAL
  Filled 2024-10-14 (×2): qty 1

## 2024-10-14 MED ORDER — CALCIUM CARBONATE 1250 (500 CA) MG PO TABS
1.0000 | ORAL_TABLET | Freq: Every day | ORAL | Status: DC
  Administered 2024-10-14 – 2024-10-16 (×3): 1250 mg via ORAL
  Filled 2024-10-14 (×3): qty 1

## 2024-10-14 MED ORDER — ACETAMINOPHEN 325 MG PO TABS: 650.0000 mg | ORAL_TABLET | Freq: Four times a day (QID) | ORAL | Status: DC | PRN

## 2024-10-14 MED ORDER — AMLODIPINE BESYLATE 2.5 MG PO TABS
2.5000 mg | ORAL_TABLET | Freq: Every day | ORAL | Status: DC
  Administered 2024-10-14 – 2024-10-16 (×3): 2.5 mg via ORAL
  Filled 2024-10-14 (×3): qty 1

## 2024-10-14 NOTE — ED Triage Notes (Signed)
 Pt BIB EMS from [facility] d/t urine retention. Patient has hx of urine retention and uti.  BP: 122/80 P: 100 RA; 98 R: 22

## 2024-10-14 NOTE — ED Notes (Signed)
 Pt A&O x 4, anxious, pleasant & cooperative.  No distress noted, monitoring for safety.

## 2024-10-14 NOTE — Progress Notes (Signed)
 LCSW Progress Note  [id number]   Tarris Delbene  10/14/2024  11:04 AM  Description:   Inpatient Psychiatric Referral  Patient was recommended inpatient per Roxianne Olp  (NP). There are no available beds at [facility], per [facility] [name]). Patient was referred to the following out of network facilities:    [facility] Provider Address Phone Fax  [facility]  [address] [place] [address] [phone]  [facility]  [address] [place] [address] [phone]  [facility]  [address] [place], [place] [address] [phone]  [facility]  [address] [place] [address] [phone]  [facility]  [address], [place] [address] [phone]  [facility]  [address] [place] [address] [phone]  [facility] EFAX  [address] [place], [place] [phone]  [facility]  [address] [place] [address] [phone]  [facility]  [address], [place] [address] [phone]     Situation ongoing, CSW to continue following and update chart as more information becomes available.     T[place] Atleigh Gruen MSW, LCSW  10/14/2024 11:04 AM

## 2024-10-14 NOTE — ED Provider Notes (Signed)
 " [place] EMERGENCY DEPARTMENT AT [facility] Provider Note   CSN: [id number] Arrival date & time: 10/14/24  0140     Patient presents with: Urinary Retention   Robert Fowler is a 74 y.o. male.  Patient with history of schizoaffective disorder, bipolar disorder, BPH presents to the emergency department from the behavioral health urgent care due to urinary retention.  Patient states he last urinated greater than 12 hours ago.  He states his history of the same and has had to require Foley catheters in the past.  He denies abdominal pain, nausea, vomiting.  He has a history of BPH.  Behavioral health will accept the patient once medically cleared   HPI     Prior to Admission medications  Medication Sig Start Date End Date Taking? Authorizing Provider  acetaminophen  (TYLENOL ) 500 MG tablet Take 1,000 mg by mouth at bedtime.    [provider]  amLODipine  (NORVASC ) 2.5 MG tablet Take 1 tablet (2.5 mg total) by mouth daily. 05/17/24   Rilla Baller, MD  amoxicillin -clavulanate (AUGMENTIN ) 875-125 MG tablet Take 1 tablet by mouth 2 (two) times daily. 10/03/24   Sherida Adine BROCKS, MD  aspirin  81 MG tablet Take 81 mg by mouth daily.    [provider]  B Complex Vitamins (VITAMIN B COMPLEX  PO) Take 1 capsule by mouth every other day.    [provider]  benztropine  (COGENTIN ) 0.5 MG tablet Take 0.5 mg by mouth at bedtime.    [provider]  Calcium  Citrate 333 MG TABS Take 1 tablet by mouth daily. 11/09/22   Rilla Baller, MD  Cholecalciferol  (VITAMIN D3 PO) Take 1 capsule by mouth in the morning.    [provider]  clindamycin  (CLEOCIN ) 300 MG capsule Take 1 capsule (300 mg total) by mouth 3 (three) times daily. 10/03/24   Sherida Adine BROCKS, MD  divalproex  (DEPAKOTE  ER) 250 MG 24 hr tablet Take 750 mg by mouth at bedtime. 02/23/18   [provider]  FLUoxetine  (PROZAC ) 20 MG capsule Take 20 mg by mouth in the morning. 02/23/18    [provider]  hydrOXYzine  (ATARAX /VISTARIL ) 25 MG tablet Take 25 mg by mouth 2 (two) times daily as needed for anxiety. 05/07/21   Rilla Baller, MD  OLANZapine  zydis (ZYPREXA ) 10 MG disintegrating tablet Take 10 mg by mouth See admin instructions. Dissolve 10 mg in the mouth at bedtime    [provider]  OLANZapine  zydis (ZYPREXA ) 5 MG disintegrating tablet Take 5 mg by mouth See admin instructions. Dissolve 5 mg in the mouth in the morning    [provider]  tadalafil  (CIALIS ) 5 MG tablet Take 1 tablet (5 mg total) by mouth daily. 04/22/24   Pokhrel, Vernal, MD  tamsulosin  (FLOMAX ) 0.4 MG CAPS capsule Take 1 capsule (0.4 mg total) by mouth daily. 05/17/24   Rilla Baller, MD    Allergies: Poison oak extract    Review of Systems  Updated Vital Signs BP (!) 131/92 (BP Location: Left Arm)   Pulse 78   SpO2 96%   Physical Exam Vitals and nursing note reviewed.  Constitutional:      General: He is not in acute distress.    Appearance: He is well-developed.  HENT:     Head: Normocephalic and atraumatic.  Eyes:     Conjunctiva/sclera: Conjunctivae normal.  Cardiovascular:     Rate and Rhythm: Normal rate and regular rhythm.  Pulmonary:     Effort: Pulmonary effort is normal.  No respiratory distress.     Breath sounds: Normal breath sounds.  Abdominal:     Palpations: Abdomen is soft.     Tenderness: There is no abdominal tenderness.  Musculoskeletal:        General: No swelling.     Cervical back: Neck supple.  Skin:    General: Skin is warm and dry.     Capillary Refill: Capillary refill takes less than 2 seconds.  Neurological:     Mental Status: He is alert.  Psychiatric:        Mood and Affect: Mood normal.     (all labs ordered are listed, but only abnormal results are displayed) Labs Reviewed  CBC WITH DIFFERENTIAL/PLATELET - Abnormal; Notable for the following components:      Result Value   Hemoglobin 12.9 (*)    HCT 37.4  (*)    All other components within normal limits  BASIC METABOLIC PANEL WITH GFR - Abnormal; Notable for the following components:   Sodium 133 (*)    Glucose, Bld 103 (*)    All other components within normal limits  URINALYSIS, ROUTINE W REFLEX MICROSCOPIC - Abnormal; Notable for the following components:   Ketones, ur 5 (*)    Bacteria, UA RARE (*)    All other components within normal limits    EKG: None  Radiology: No results found.   Ultrasound ED Renal  Date/Time: 10/14/2024 3:11 AM  Performed by: Logan Ubaldo NOVAK, PA-C Authorized by: Logan Ubaldo NOVAK, PA-C   Procedure details:    Indications: urinary retention     Technique:  BladderImages: archived Bladder findings:    Bladder:  Visualized   Volume:  565    Medications Ordered in the ED - No data to display                                  Medical Decision Making Amount and/or Complexity of Data Reviewed Labs: ordered.   This patient presents to the ED for concern of urinary retention, this involves an extensive number of treatment options, and is a complaint that carries with it a high risk of complications and morbidity.     Co morbidities / Chronic conditions that complicate the patient evaluation  As noted in HPI   Additional history obtained:  Additional history obtained from EMR External records from outside source obtained and reviewed including behavioral health notes   Lab Tests:  I Ordered, and personally interpreted labs.  The pertinent results include: UA with no sign of infection   Social Determinants of Health:  Patient is a former smoker    Test / Admission - Considered:  Foley catheter placed with relief of urinary retention.  Plan to discharge with Foley catheter with leg bag.  Patient will need to follow-up with urology for reevaluation.  Patient clear at this time medically for return to behavioral health.  Discussed with Roxianne Olp, NP who agrees with transfer back  to facility.       Final diagnoses:  Urinary retention    ED Discharge Orders     None          Logan Ubaldo NOVAK DEVONNA 10/14/24 [id number]    Theadore Ozell HERO, MD 10/14/24 [phone]  "

## 2024-10-14 NOTE — ED Notes (Addendum)
 Pt readmitted to [facility] from [facility]. Pt returned with Foley leg bag for urinary retention.

## 2024-10-14 NOTE — Discharge Instructions (Signed)
 You may keep the Foley with leg bag in place until following up with urology.  They will decide when it is appropriate to remove the catheter.

## 2024-10-14 NOTE — Progress Notes (Signed)
 Inpatient Psychiatric Referral  Patient was recommended inpatient per Roxianne Olp, NP. There are no available beds at [facility], per [facility]. Patient was referred to the following out of network facilities:  [facility] Provider Address Phone Fax  [facility]  [address], [place] [address] [phone]  [facility]  [address] [place] [address] [phone]  [facility]  [address] [place], [place] [address] [phone]  [facility]  [address] [place] [address] [phone]  [facility]  [address] [place] [address] [phone]  [facility]  [address] [place] [address] [phone]  [facility]  [address] [place], [place] [phone]  [facility]  [address] [place] [address] [phone]  [facility]  [address] [place] [address] [phone]  [facility]  [address] [place] [address] [phone]  [facility]  [address] [name] [address] [phone]    Situation ongoing, CSW to continue following and update chart as more information becomes available.   Harrie Sofia MSW, [place] 10/14/2024

## 2024-10-14 NOTE — ED Notes (Signed)
Pt sitting up at present, no distress noted.  Monitoring for safety.

## 2024-10-14 NOTE — ED Notes (Signed)
 Pt continues to be up and about on the unit.  He talks incessantly, often times to himself.  Currently he is on the telephone.  He is pleasant and cooperative with out any complaints.

## 2024-10-14 NOTE — ED Notes (Signed)
 Per [facility], discharge pt

## 2024-10-14 NOTE — ED Notes (Signed)
 Pt has been up and visible on the unit.  He is hyperactive, gets up and down frequently.  Speech is rapid and tangential.   He is pleasant and redirectable.  Accepts medications as prescribed and knows his medications.  Eating and drinking fluids well.  Foley catheter to leg bag.  Patient empties it himself.  UDS obtained.  VS WNL.  Denies SI/HI/AVH, depression.   He appears anxious.  Working on geri-psych placement for medication management.  Provider Doris notified that pt had the foley catheter inserted last night and  that he has the wound on his Left knee.  Pt refused polysporin  ointment.  He said that his surgeon told him to leave it open to air. He reports his Dr. Is Hermelinda Mon  (Ortho).

## 2024-10-14 NOTE — ED Provider Notes (Signed)
 Patient c/o of not being able to urinate and having pain and pressure. Patient has history of urinary retention and was sent out to [facility]. Patient has returned with a foley catheter. Patient will f/u with outpatient urology once he is discharged. Robert Fowler

## 2024-10-14 NOTE — ED Notes (Signed)
 Report called to [facility]. Pt being transferred via non emergent ambulance. Pt stable, ambulatory.

## 2024-10-14 NOTE — ED Provider Notes (Addendum)
 Behavioral Health Progress Note  Date and Time: 10/14/2024 6:59 PM Name: Robert Fowler MRN:  [id number]  HPI: Per triage: Robert Fowler is a 89Y male presenting to [facility] as a vol walk-in with his family. Pt states he is here today because he feels like he is taking too many medications. Pt states he is also afriad he is getting dementia because his mother died of it and he has started to stutter his words a lot. Pt states he has been feeling very hyper and has not been getting much sleep. Pt states he has an a hx of past inpatient stays going back to 1968. Pt states he just needs something that will calm him down. Pt has a hx of schizophrenia and bipolar. Pt is seeing a therapist and taking medicine, Pt denies SI, HI, and substance use. Robert Fowler, Robert Fowler, NT,Date of Service: 10/13/2024 11:35 AM).  Patient was assessed by provider, determined to have manic type symptoms, and to meet criteria for an inpatient hospitalization, and those recommendations were made. Overnight, pt was sent to the ER due to urinary retention, and was returned to the [facility] with an indwelling urinary catheter.  Patient assessment, 10/14/2024: Prior to assessing patient, it thought process was that urinary symptoms, most likely cause of his delirium, and that he would be a candidate for discharge, and management outpatient, but after talking to him earlier today morning, this preconceived notion changed; patient presented erratic, with pressured speech, was hyperverbal, had flight of ideas, talking about random topics including how he uses and takes Cialis , and sometimes takes it more than prescribed; He describes taking 2 of those pills the night prior to presenting to this facility, states I had an explosion, then proceeds to talking about visiting his mother's grave on Christmas, then reading his bible, talks about watching pornography, etc. Patient reports poor sleep for 3 days consecutively, stating that he needs sleep  due to his inability to focus. He shares that he slept for one hour last night here on the unit.  Reports that he lives alone, sister checks on him frequently, then states that he is on a lot of medications, but does not think that he needs all of these medications.  Patient is not currently manic, but presents hypomanic, he is however oriented to person, place and situation; reports appetite is average, energy is elevated; he is able to list all of the medications that he is on, intermittently pausing to recall some of them.   He denies suicidal ideations, denies homicidal ideations, denies auditory or visual hallucinations.  Denies paranoia, denies delusional thinking.  Does not seem to be responding to any internal or external stimuli.  Patient is observed to have some sutures to his left knee area, there is no drainage to the area, and he states that he is supposed to be going to get his sutures removed on Wednesday of this week.  He talks about the area needing to be incised last week in order to be drained due to an abscess that had formed there.  Patient is noted to have the indwelling urinary catheter that was placed after he was sent to the hospital last night, and it is draining yellow urine, and patient is getting it emptied by himself, with no issues.  He is educated to let staff know if he needs assistance, verbalizes understanding.  Patient states that he recurrently gets infections, urinary retentions, requiring frequent indwelling catheters due to inability to void.  We talked about  the need to see a urologist after he gets discharged, and he verbalized understanding.  After the presentation above, it was determined the patient needed to be kept here at the urgent care center, and referred for inpatient hospitalization, so that he can benefit from medication adjustments, as current medication regimen does not seem to be effective for him.  Writer reached out to the [facility] who states that that  [facility] is at capacity, and pt will be considered for that unit when there are discharges there.  Patient is on olanzapine , but he states that his last psychosis that he experienced was in 1978.  We talked about medication adjustments including the following:  -Discontinuing Cogentin  due to its anticholinergic effects, also given that patient is having urinary problems, also given his age, medication also places him at a high risks of falls.  -We also talked about discontinuing as needed hydroxyzine  also for the same reason as listed above.  -We talked about reducing Zyprexa  from 10 mg at night to 5 mg, and adding Melatonin 5 mg nightly.   Diagnosis:  Final diagnoses:  Schizoaffective disorder, bipolar type (HCC)    Total Time spent with patient: 45 minutes  Past Psychiatric History: Schizoaffective d/o Past Medical History:  Past Medical History:  Diagnosis Date   Arthritis    BENIGN PROSTATIC HYPERTROPHY 07/18/2007   Bipolar disorder (HCC)    Habitual alcohol use    Lesion of oral mucosa 03/21/2018   Biopsy 03/2018 [place]) - INFLAMMATORY FIBROUS HYPERPLASIA CONSISTENT WITH DENTURE INJURY (EPULIS FISSURATUM).    OSTEOPOROSIS 09/04/2007   DEXA -2.8 lumbar (03/2015)   Pes planus    Prediabetes 2014   Schizoaffective disorder (HCC)    h/o paranoid schizophrenia   Wears dentures    full upper and lower    Family History:  Family History  Problem Relation Age of Onset   COPD Mother        smoker   Dementia Mother    Alcohol abuse Father    Alcohol abuse Maternal Grandmother    CAD Neg Hx    Stroke Neg Hx    Cancer Neg Hx    Diabetes Neg Hx    Hypertension Neg Hx     Family Psychiatric  History: not provided Social History:  Social History   Socioeconomic History   Marital status: Single    Spouse name: Not on file   Number of children: Not on file   Years of education: Not on file   Highest education level: Not on file  Occupational History   Not on file   Tobacco Use   Smoking status: Former    Current packs/day: 0.00    Types: Cigarettes    Quit date: 09/26/2006    Years since quitting: 18.0   Smokeless tobacco: Never  Vaping Use   Vaping status: Never Used  Substance and Sexual Activity   Alcohol use: Not Currently    Comment: quit   Drug use: Yes    Types: Marijuana    Comment: last done 1/14 bc anxiety   Sexual activity: Not Currently  Other Topics Concern   Not on file  Social History Narrative   Lives alone.     Mother and brother live nearby.   Occupation: copy at [employer]   Edu: 12th grade   Activity: mows yard   Diet: good water, fruits/vegetables daily      Disability evaluation - no disability (04/2014)   Social Drivers of Health  Tobacco Use: Medium Risk (10/14/2024)   Patient History    Smoking Tobacco Use: Former    Smokeless Tobacco Use: Never    Passive Exposure: Not on file  Financial Resource Strain: Low Risk (05/14/2024)   Overall Financial Resource Strain (CARDIA)    Difficulty of Paying Living Expenses: Not hard at all  Food Insecurity: No Food Insecurity (10/14/2024)   Epic    Worried About Programme Researcher, Broadcasting/film/video in the Last Year: Never true    Ran Out of Food in the Last Year: Never true  Transportation Needs: No Transportation Needs (10/14/2024)   Epic    Lack of Transportation (Medical): No    Lack of Transportation (Non-Medical): No  Physical Activity: Inactive (05/14/2024)   Exercise Vital Sign    Days of Exercise per Week: 0 days    Minutes of Exercise per Session: 0 min  Stress: No Stress Concern Present (05/14/2024)   [employer] of Occupational Health - Occupational Stress Questionnaire    Feeling of Stress: Not at all  Social Connections: Moderately Integrated (05/14/2024)   Social Connection and Isolation Panel    Frequency of Communication with Friends and Family: Twice a week    Frequency of Social Gatherings with Friends and Family: Once a week    Attends Religious  Services: More than 4 times per year    Active Member of Clubs or Organizations: Yes    Attends Banker Meetings: More than 4 times per year    Marital Status: Never married  Intimate Partner Violence: Not At Risk (10/14/2024)   Epic    Fear of Current or Ex-Partner: No    Emotionally Abused: No    Physically Abused: No    Sexually Abused: No  Depression (PHQ2-9): Medium Risk (10/09/2024)   Depression (PHQ2-9)    PHQ-2 Score: 7  Alcohol Screen: Low Risk (08/27/2024)   Alcohol Screen    Last Alcohol Screening Score (AUDIT): 0  Housing: Low Risk (05/14/2024)   Epic    Unable to Pay for Housing in the Last Year: No    Number of Times Moved in the Last Year: 0    Homeless in the Last Year: No  Utilities: Not At Risk (10/14/2024)   Epic    Threatened with loss of utilities: No  Health Literacy: Adequate Health Literacy (05/14/2024)   B1300 Health Literacy    Frequency of need for help with medical instructions: Never    Sleep: Poor  Appetite:  Fair  Current Medications:  Current Facility-Administered Medications  Medication Dose Route Frequency Provider Last Rate Last Admin   acetaminophen  (TYLENOL ) tablet 650 mg  650 mg Oral Q6H PRN Bobbitt, Shalon E, NP       alum & mag hydroxide-simeth (MAALOX/MYLANTA) 200-200-20 MG/5ML suspension 30 mL  30 mL Oral Q4H PRN Bobbitt, Shalon E, NP       amLODipine  (NORVASC ) tablet 2.5 mg  2.5 mg Oral Daily Bobbitt, Shalon E, NP   2.5 mg at 10/14/24 9081   aspirin  chewable tablet 81 mg  81 mg Oral Daily Bobbitt, Shalon E, NP   81 mg at 10/14/24 0918   B-complex with vitamin C tablet 1 tablet  1 tablet Oral QODAY Bobbitt, Shalon E, NP   1 tablet at 10/14/24 9075   calcium  carbonate (OS-CAL - dosed in mg of elemental calcium ) tablet 1,250 mg  1 tablet Oral Q breakfast Bobbitt, Shalon E, NP   1,250 mg at 10/14/24 0827   cholecalciferol  (VITAMIN D3)  25 MCG (1000 UNIT) tablet 1,000 Units  1,000 Units Oral q AM Bobbitt, Shalon E, NP   1,000 Units  at 10/14/24 9081   divalproex  (DEPAKOTE  ER) 24 hr tablet 750 mg  750 mg Oral QHS Bobbitt, Shalon E, NP       magnesium  hydroxide (MILK OF MAGNESIA) suspension 30 mL  30 mL Oral Daily PRN Bobbitt, Shalon E, NP       melatonin tablet 5 mg  5 mg Oral QHS Nyilah Kight, NP       neomycin -bacitracin -polymyxin [phone] OINT 1 Application  1 Application Topical Once Bobbitt, Shalon E, NP       OLANZapine  (ZYPREXA ) injection 5 mg  5 mg Intramuscular TID PRN Bobbitt, Shalon E, NP       OLANZapine  zydis (ZYPREXA ) disintegrating tablet 5 mg  5 mg Oral TID PRN Bobbitt, Shalon E, NP       OLANZapine  zydis (ZYPREXA ) disintegrating tablet 5 mg  5 mg Oral BID Agnes Brightbill, NP       tamsulosin  (FLOMAX ) capsule 0.4 mg  0.4 mg Oral Daily Bobbitt, Shalon E, NP   0.4 mg at 10/14/24 9081   Current Outpatient Medications  Medication Sig Dispense Refill   acetaminophen  (TYLENOL ) 500 MG tablet Take 500 mg by mouth at bedtime.     amLODipine  (NORVASC ) 2.5 MG tablet Take 1 tablet (2.5 mg total) by mouth daily. 90 tablet 3   amoxicillin -clavulanate (AUGMENTIN ) 875-125 MG tablet Take 1 tablet by mouth 2 (two) times daily. (Patient taking differently: Take 1 tablet by mouth 2 (two) times daily. Take for 10 days starting on 10/03/24) 20 tablet 0   aspirin  EC 81 MG tablet Take 81 mg by mouth daily. Swallow whole.     B Complex Vitamins (VITAMIN B COMPLEX  PO) Take 1 capsule by mouth every other day.     benztropine  (COGENTIN ) 0.5 MG tablet Take 0.5 mg by mouth at bedtime.     Calcium  Citrate 333 MG TABS Take 333 mg by mouth daily.     cephALEXin  (KEFLEX ) 500 MG capsule Take 500 mg by mouth 4 (four) times daily. Take for 7 days starting on 09/27/24     Cholecalciferol  (VITAMIN D3 PO) Take 1 capsule by mouth in the morning.     divalproex  (DEPAKOTE  ER) 250 MG 24 hr tablet Take 750 mg by mouth at bedtime.     FLUoxetine  (PROZAC ) 20 MG capsule Take 20 mg by mouth in the morning.     hydrOXYzine  (ATARAX /VISTARIL ) 25 MG tablet  Take 25 mg by mouth in the morning and at bedtime.     OLANZapine  zydis (ZYPREXA ) 10 MG disintegrating tablet Take 10 mg by mouth at bedtime.     OLANZapine  zydis (ZYPREXA ) 5 MG disintegrating tablet Take 5 mg by mouth daily.     tadalafil  (CIALIS ) 5 MG tablet Take 1 tablet (5 mg total) by mouth daily. (Patient taking differently: Take 5 mg by mouth at bedtime as needed for erectile dysfunction.) 10 tablet 0   tamsulosin  (FLOMAX ) 0.4 MG CAPS capsule Take 1 capsule (0.4 mg total) by mouth daily. 90 capsule 3    Labs  Lab Results:  Admission on 10/14/2024  Component Date Value Ref Range Status   POC Amphetamine UR 10/14/2024 None Detected  NONE DETECTED (Cut Off Level 1000 ng/mL) Final   POC Secobarbital (BAR) 10/14/2024 None Detected  NONE DETECTED (Cut Off Level 300 ng/mL) Final   POC Buprenorphine (BUP) 10/14/2024 None Detected  NONE DETECTED (Cut Off  Level 10 ng/mL) Final   POC Oxazepam (BZO) 10/14/2024 None Detected  NONE DETECTED (Cut Off Level 300 ng/mL) Final   POC Cocaine UR 10/14/2024 None Detected  NONE DETECTED (Cut Off Level 300 ng/mL) Final   POC Methamphetamine UR 10/14/2024 None Detected  NONE DETECTED (Cut Off Level 1000 ng/mL) Final   POC Morphine  10/14/2024 None Detected  NONE DETECTED (Cut Off Level 300 ng/mL) Final   POC Methadone UR 10/14/2024 None Detected  NONE DETECTED (Cut Off Level 300 ng/mL) Final   POC Oxycodone  UR 10/14/2024 None Detected  NONE DETECTED (Cut Off Level 100 ng/mL) Final   POC Marijuana UR 10/14/2024 Positive (A)  NONE DETECTED (Cut Off Level 50 ng/mL) Final  Admission on 10/14/2024, Discharged on 10/14/2024  Component Date Value Ref Range Status   WBC 10/14/2024 6.0  4.0 - 10.5 K/uL Final   RBC 10/14/2024 4.48  4.22 - 5.81 MIL/uL Final   Hemoglobin 10/14/2024 12.9 (L)  13.0 - 17.0 g/dL Final   HCT 98/80/7973 37.4 (L)  39.0 - 52.0 % Final   MCV 10/14/2024 83.5  80.0 - 100.0 fL Final   MCH 10/14/2024 28.8  26.0 - 34.0 pg Final   MCHC 10/14/2024  34.5  30.0 - 36.0 g/dL Final   RDW 98/80/7973 14.5  11.5 - 15.5 % Final   Platelets 10/14/2024 207  150 - 400 K/uL Final   nRBC 10/14/2024 0.0  0.0 - 0.2 % Final   Neutrophils Relative % 10/14/2024 67  % Final   Neutro Abs 10/14/2024 4.0  1.7 - 7.7 K/uL Final   Lymphocytes Relative 10/14/2024 20  % Final   Lymphs Abs 10/14/2024 1.2  0.7 - 4.0 K/uL Final   Monocytes Relative 10/14/2024 10  % Final   Monocytes Absolute 10/14/2024 0.6  0.1 - 1.0 K/uL Final   Eosinophils Relative 10/14/2024 2  % Final   Eosinophils Absolute 10/14/2024 0.1  0.0 - 0.5 K/uL Final   Basophils Relative 10/14/2024 1  % Final   Basophils Absolute 10/14/2024 0.0  0.0 - 0.1 K/uL Final   Immature Granulocytes 10/14/2024 0  % Final   Abs Immature Granulocytes 10/14/2024 0.02  0.00 - 0.07 K/uL Final   Performed at [facility], [address] [place], [place] [address]   Sodium 10/14/2024 133 (L)  135 - 145 mmol/L Final   Potassium 10/14/2024 4.1  3.5 - 5.1 mmol/L Final   Chloride 10/14/2024 98  98 - 111 mmol/L Final   CO2 10/14/2024 24  22 - 32 mmol/L Final   Glucose, Bld 10/14/2024 103 (H)  70 - 99 mg/dL Final   Glucose reference range applies only to samples taken after fasting for at least 8 hours.   BUN 10/14/2024 14  8 - 23 mg/dL Final   Creatinine, Ser 10/14/2024 0.76  0.61 - 1.24 mg/dL Final   Calcium  10/14/2024 9.6  8.9 - 10.3 mg/dL Final   GFR, Estimated 10/14/2024 >60  >60 mL/min Final   Comment: (NOTE) Calculated using the CKD-EPI Creatinine Equation (2021)    Anion gap 10/14/2024 11  5 - 15 Final   Performed at [facility], [address], [place], [place] [address]   Color, Urine 10/14/2024 YELLOW  YELLOW Final   APPearance 10/14/2024 CLEAR  CLEAR Final   Specific Gravity, Urine 10/14/2024 1.015  1.005 - 1.030 Final   pH 10/14/2024 6.0  5.0 - 8.0 Final   Glucose, UA 10/14/2024 NEGATIVE  NEGATIVE mg/dL Final  Hgb urine dipstick 10/14/2024 NEGATIVE  NEGATIVE  Final   Bilirubin Urine 10/14/2024 NEGATIVE  NEGATIVE Final   Ketones, ur 10/14/2024 5 (A)  NEGATIVE mg/dL Final   Protein, ur 98/80/7973 NEGATIVE  NEGATIVE mg/dL Final   Nitrite 98/80/7973 NEGATIVE  NEGATIVE Final   Leukocytes,Ua 10/14/2024 NEGATIVE  NEGATIVE Final   RBC / HPF 10/14/2024 0-5  0 - 5 RBC/hpf Final   WBC, UA 10/14/2024 0-5  0 - 5 WBC/hpf Final   Bacteria, UA 10/14/2024 RARE (A)  NONE SEEN Final   Squamous Epithelial / HPF 10/14/2024 0-5  0 - 5 /HPF Final   Mucus 10/14/2024 PRESENT   Final   Performed at [facility], [address], [place], [place] [address]  Admission on 10/13/2024, Discharged on 10/14/2024  Component Date Value Ref Range Status   SARS Coronavirus 2 by RT PCR 10/13/2024 NEGATIVE  NEGATIVE Final   Performed at [facility], [address] [place], [place] [address]   WBC 10/13/2024 6.3  4.0 - 10.5 K/uL Final   RBC 10/13/2024 4.58  4.22 - 5.81 MIL/uL Final   Hemoglobin 10/13/2024 13.4  13.0 - 17.0 g/dL Final   HCT 98/81/7973 37.6 (L)  39.0 - 52.0 % Final   MCV 10/13/2024 82.1  80.0 - 100.0 fL Final   MCH 10/13/2024 29.3  26.0 - 34.0 pg Final   MCHC 10/13/2024 35.6  30.0 - 36.0 g/dL Final   RDW 98/81/7973 14.4  11.5 - 15.5 % Final   Platelets 10/13/2024 227  150 - 400 K/uL Final   nRBC 10/13/2024 0.0  0.0 - 0.2 % Final   Neutrophils Relative % 10/13/2024 68  % Final   Neutro Abs 10/13/2024 4.3  1.7 - 7.7 K/uL Final   Lymphocytes Relative 10/13/2024 18  % Final   Lymphs Abs 10/13/2024 1.1  0.7 - 4.0 K/uL Final   Monocytes Relative 10/13/2024 10  % Final   Monocytes Absolute 10/13/2024 0.6  0.1 - 1.0 K/uL Final   Eosinophils Relative 10/13/2024 2  % Final   Eosinophils Absolute 10/13/2024 0.2  0.0 - 0.5 K/uL Final   Basophils Relative 10/13/2024 1  % Final   Basophils Absolute 10/13/2024 0.1  0.0 - 0.1 K/uL Final   Immature Granulocytes 10/13/2024 1  % Final   Abs Immature Granulocytes 10/13/2024 0.04  0.00 - 0.07 K/uL Final    Performed at [facility], [address] [place], [place] [address]   Sodium 10/13/2024 135  135 - 145 mmol/L Final   Potassium 10/13/2024 4.3  3.5 - 5.1 mmol/L Final   Chloride 10/13/2024 99  98 - 111 mmol/L Final   CO2 10/13/2024 23  22 - 32 mmol/L Final   Glucose, Bld 10/13/2024 91  70 - 99 mg/dL Final   Glucose reference range applies only to samples taken after fasting for at least 8 hours.   BUN 10/13/2024 11  8 - 23 mg/dL Final   Creatinine, Ser 10/13/2024 0.80  0.61 - 1.24 mg/dL Final   Calcium  10/13/2024 9.7  8.9 - 10.3 mg/dL Final   Total Protein 98/81/7973 7.2  6.5 - 8.1 g/dL Final   Albumin 98/81/7973 4.3  3.5 - 5.0 g/dL Final   AST 98/81/7973 13 (L)  15 - 41 U/L Final   ALT 10/13/2024 12  0 - 44 U/L Final   Alkaline Phosphatase 10/13/2024 87  38 - 126 U/L Final   Total Bilirubin 10/13/2024 0.7  0.0 - 1.2  mg/dL Final   GFR, Estimated 10/13/2024 >60  >60 mL/min Final   Comment: (NOTE) Calculated using the CKD-EPI Creatinine Equation (2021)    Anion gap 10/13/2024 13  5 - 15 Final   Performed at [facility], [address], [place], [place] [address]   Hgb A1c MFr Bld 10/13/2024 5.5  4.8 - 5.6 % Final   Comment: (NOTE) Diagnosis of Diabetes The following HbA1c ranges recommended by the American Diabetes Association (ADA) may be used as an aid in the diagnosis of diabetes mellitus.  Hemoglobin             Suggested A1C NGSP%              Diagnosis  <5.7                   Non Diabetic  5.7-6.4                Pre-Diabetic  >6.4                   Diabetic  <7.0                   Glycemic control for                       adults with diabetes.     Mean Plasma Glucose 10/13/2024 111.15  mg/dL Final   Performed at [facility], [address], [place], [place] [address]   Alcohol, Ethyl (B) 10/13/2024 <15  <15 mg/dL Final   Comment: (NOTE) For medical purposes only. Performed at [facility], [address], [place], [place] [address]     Cholesterol 10/13/2024 156  0 - 200 mg/dL Final   Comment:        ATP III CLASSIFICATION:  <200     mg/dL   Desirable  799-760  mg/dL   Borderline High  >=759    mg/dL   High           Triglycerides 10/13/2024 63  <150 mg/dL Final   HDL 98/81/7973 63  >40 mg/dL Final   Total CHOL/HDL Ratio 10/13/2024 2.5  RATIO Final   VLDL 10/13/2024 13  0 - 40 mg/dL Final   LDL Cholesterol 10/13/2024 80  0 - 99 mg/dL Final   Comment:        Total Cholesterol/HDL:CHD Risk Coronary Heart Disease Risk Table                     Men   Women  1/2 Average Risk   3.4   3.3  Average Risk       5.0   4.4  2 X Average Risk   9.6   7.1  3 X Average Risk  23.4   11.0        Use the calculated Patient Ratio above and the CHD Risk Table to determine the patient's CHD Risk.        ATP III CLASSIFICATION (LDL):  <100     mg/dL   Optimal  899-870  mg/dL   Near or Above                    Optimal  130-159  mg/dL   Borderline  839-810  mg/dL   High  >809     mg/dL   Very High Performed at [facility], [address],  [place], [place] [address]    TSH 10/13/2024 1.380  0.350 - 4.500 uIU/mL Final   Performed at [facility], [address], [place], [place] [address]   Valproic Acid  Lvl 10/13/2024 49 (L)  50 - 100 ug/mL Final   Performed at [facility], [address] [place], [place] [address]  Office Visit on 10/09/2024  Component Date Value Ref Range Status   Sodium 10/09/2024 130 (L)  135 - 145 mEq/L Final   Potassium 10/09/2024 4.2  3.5 - 5.1 mEq/L Final   Chloride 10/09/2024 95 (L)  96 - 112 mEq/L Final   CO2 10/09/2024 30  19 - 32 mEq/L Final   Elevated LDH levels may cause falsely increased CO2 results. If LDH is >2000 U/L, a positive bias of 12% is possible.   Glucose, Bld 10/09/2024 89  70 - 99 mg/dL Final   BUN 98/85/7973 6  6 - 23 mg/dL Final   Creatinine, Ser 10/09/2024 0.67  0.40 - 1.50 mg/dL Final   Total Bilirubin 10/09/2024 0.5  0.2 - 1.2 mg/dL Final   Alkaline  Phosphatase 10/09/2024 77  39 - 117 U/L Final   AST 10/09/2024 9  5 - 37 U/L Final   ALT 10/09/2024 13  3 - 53 U/L Final   Total Protein 10/09/2024 7.1  6.0 - 8.3 g/dL Final   Albumin 98/85/7973 4.1  3.5 - 5.2 g/dL Final   GFR 98/85/7973 93.22  >60.00 mL/min Final   Calculated using the CKD-EPI Creatinine Equation (2021)   Calcium  10/09/2024 9.8  8.4 - 10.5 mg/dL Final   WBC 98/85/7973 5.0  4.0 - 10.5 K/uL Final   RBC 10/09/2024 4.44  4.22 - 5.81 Mil/uL Final   Hemoglobin 10/09/2024 13.0  13.0 - 17.0 g/dL Final   HCT 98/85/7973 37.1 (L)  39.0 - 52.0 % Final   MCV 10/09/2024 83.5  78.0 - 100.0 fl Final   MCHC 10/09/2024 34.9  30.0 - 36.0 g/dL Final   RDW 98/85/7973 14.5  11.5 - 15.5 % Final   Platelets 10/09/2024 259.0  150.0 - 400.0 K/uL Final   Neutrophils Relative % 10/09/2024 63.3  43.0 - 77.0 % Final   Lymphocytes Relative 10/09/2024 20.5  12.0 - 46.0 % Final   Monocytes Relative 10/09/2024 10.2  3.0 - 12.0 % Final   Eosinophils Relative 10/09/2024 4.9  0.0 - 5.0 % Final   Basophils Relative 10/09/2024 1.1  0.0 - 3.0 % Final   Neutro Abs 10/09/2024 3.2  1.4 - 7.7 K/uL Final   Lymphs Abs 10/09/2024 1.0  0.7 - 4.0 K/uL Final   Monocytes Absolute 10/09/2024 0.5  0.1 - 1.0 K/uL Final   Eosinophils Absolute 10/09/2024 0.2  0.0 - 0.7 K/uL Final   Basophils Absolute 10/09/2024 0.1  0.0 - 0.1 K/uL Final  Admission on 10/03/2024, Discharged on 10/03/2024  Component Date Value Ref Range Status   Specimen Description 10/03/2024    Final                   Value:FLUID Performed at [facility], [address], [place], [place] [address]    Special Requests 10/03/2024    Final                   Value:NONE Performed at [facility], [address], [place], [place] [address]    Gram Stain 10/03/2024    Final  Value:NO WBC SEEN NO ORGANISMS SEEN    Culture 10/03/2024    Final                   Value:RARE STAPHYLOCOCCUS AUREUS NO  ANAEROBES ISOLATED Performed at [facility], [address] [place], [place] [address]    Report Status 10/03/2024 10/08/2024 FINAL   Final   Organism ID, Bacteria 10/03/2024 STAPHYLOCOCCUS AUREUS   Final  Office Visit on 09/30/2024  Component Date Value Ref Range Status   MICRO NUMBER: 09/30/2024 [id number]   Final   SPECIMEN QUALITY: 09/30/2024 Adequate   Final   SOURCE: 09/30/2024 LEFT ANTERIOR KNEE   Final   STATUS: 09/30/2024 FINAL   Final   AER ISOLATE 1: 09/30/2024 Staphylococcus aureus (A)   Final   Scant growth of Staphylococcus aureus Negative for inducible clindamycin  resistance.  Admission on 09/14/2024, Discharged on 09/14/2024  Component Date Value Ref Range Status   Color, Urine 09/14/2024 YELLOW  YELLOW Final   APPearance 09/14/2024 CLOUDY (A)  CLEAR Final   Specific Gravity, Urine 09/14/2024 1.004 (L)  1.005 - 1.030 Final   pH 09/14/2024 7.0  5.0 - 8.0 Final   Glucose, UA 09/14/2024 NEGATIVE  NEGATIVE mg/dL Final   Hgb urine dipstick 09/14/2024 MODERATE (A)  NEGATIVE Final   Bilirubin Urine 09/14/2024 NEGATIVE  NEGATIVE Final   Ketones, ur 09/14/2024 NEGATIVE  NEGATIVE mg/dL Final   Protein, ur 87/79/7974 NEGATIVE  NEGATIVE mg/dL Final   Nitrite 87/79/7974 NEGATIVE  NEGATIVE Final   Leukocytes,Ua 09/14/2024 LARGE (A)  NEGATIVE Final   RBC / HPF 09/14/2024 11-20  0 - 5 RBC/hpf Final   WBC, UA 09/14/2024 >50  0 - 5 WBC/hpf Final   Bacteria, UA 09/14/2024 RARE (A)  NONE SEEN Final   Squamous Epithelial / HPF 09/14/2024 0-5  0 - 5 /HPF Final   WBC Clumps 09/14/2024 PRESENT   Final   Non Squamous Epithelial 09/14/2024 0-5 (A)  NONE SEEN Final   Performed at [facility], [address] [place], [place] [address]   Sodium 09/14/2024 132 (L)  135 - 145 mmol/L Final   Potassium 09/14/2024 4.0  3.5 - 5.1 mmol/L Final   Chloride 09/14/2024 97 (L)  98 - 111 mmol/L Final   CO2 09/14/2024 23  22 - 32 mmol/L Final   Glucose, Bld 09/14/2024 103 (H)  70  - 99 mg/dL Final   Glucose reference range applies only to samples taken after fasting for at least 8 hours.   BUN 09/14/2024 6 (L)  8 - 23 mg/dL Final   Creatinine, Ser 09/14/2024 0.65  0.61 - 1.24 mg/dL Final   Calcium  09/14/2024 9.7  8.9 - 10.3 mg/dL Final   GFR, Estimated 09/14/2024 >60  >60 mL/min Final   Comment: (NOTE) Calculated using the CKD-EPI Creatinine Equation (2021)    Anion gap 09/14/2024 11  5 - 15 Final   Performed at [facility], [address] [place], [place] [address]   WBC 09/14/2024 10.0  4.0 - 10.5 K/uL Final   RBC 09/14/2024 4.57  4.22 - 5.81 MIL/uL Final   Hemoglobin 09/14/2024 13.3  13.0 - 17.0 g/dL Final   HCT 87/79/7974 38.1 (L)  39.0 - 52.0 % Final   MCV 09/14/2024 83.4  80.0 - 100.0 fL Final   MCH 09/14/2024 29.1  26.0 - 34.0 pg Final   MCHC 09/14/2024 34.9  30.0 - 36.0 g/dL Final   RDW 87/79/7974 14.0  11.5 -  15.5 % Final   Platelets 09/14/2024 189  150 - 400 K/uL Final   nRBC 09/14/2024 0.0  0.0 - 0.2 % Final   Performed at [facility], [address] [place], [place] [address]   Specimen Description 09/14/2024    Final                   Value:URINE, CLEAN CATCH Performed at [facility], [address] [place], [place] [address]    Special Requests 09/14/2024    Final                   Value:NONE Performed at [facility], [address], [place], [place] [address]    Culture 09/14/2024 >=100,000 COLONIES/mL PSEUDOMONAS AERUGINOSA (A)   Final   Report Status 09/14/2024 09/17/2024 FINAL   Final   Organism ID, Bacteria 09/14/2024 PSEUDOMONAS AERUGINOSA (A)   Final  Hospital Outpatient Visit on 07/11/2024  Component Date Value Ref Range Status   WBC 07/11/2024 4.5  4.0 - 10.5 K/uL Final   RBC 07/11/2024 4.76  4.22 - 5.81 MIL/uL Final   Hemoglobin 07/11/2024 14.4  13.0 - 17.0 g/dL Final   HCT 89/83/7974 40.6  39.0 - 52.0 % Final   MCV 07/11/2024 85.3  80.0 - 100.0 fL  Final   MCH 07/11/2024 30.3  26.0 - 34.0 pg Final   MCHC 07/11/2024 35.5  30.0 - 36.0 g/dL Final   RDW 89/83/7974 12.7  11.5 - 15.5 % Final   Platelets 07/11/2024 201  150 - 400 K/uL Final   nRBC 07/11/2024 0.0  0.0 - 0.2 % Final   Performed at [facility], [address], [place], [place] [address]   Prothrombin Time 07/11/2024 12.9  11.4 - 15.2 seconds Final   INR 07/11/2024 0.9  0.8 - 1.2 Final   Comment: (NOTE) INR goal varies based on device and disease states. Performed at [facility], [address], [place], [place] [address]    Sodium 07/11/2024 134 (L)  135 - 145 mmol/L Final   Potassium 07/11/2024 4.1  3.5 - 5.1 mmol/L Final   Chloride 07/11/2024 99  98 - 111 mmol/L Final   CO2 07/11/2024 23  22 - 32 mmol/L Final   Glucose, Bld 07/11/2024 97  70 - 99 mg/dL Final   Glucose reference range applies only to samples taken after fasting for at least 8 hours.   BUN 07/11/2024 5 (L)  8 - 23 mg/dL Final   Creatinine, Ser 07/11/2024 0.70  0.61 - 1.24 mg/dL Final   Calcium  07/11/2024 9.7  8.9 - 10.3 mg/dL Final   GFR, Estimated 07/11/2024 >60  >60 mL/min Final   Comment: (NOTE) Calculated using the CKD-EPI Creatinine Equation (2021)    Anion gap 07/11/2024 12  5 - 15 Final   Performed at [facility], [address], [place], [place] [address]   Glucose-Capillary 07/11/2024 100 (H)  70 - 99 mg/dL Final   Glucose reference range applies only to samples taken after fasting for at least 8 hours.  Office Visit on 05/29/2024  Component Date Value Ref Range Status   Color, UA 05/29/2024 yellow   Final   Clarity, UA 05/29/2024 cloudy   Final   Glucose, UA 05/29/2024 Negative  Negative Final   Bilirubin, UA 05/29/2024 neg   Final   Ketones, UA 05/29/2024 neg   Final   Spec Grav, UA 05/29/2024 1.010  1.010 -  1.025 Final   Blood, UA 05/29/2024 10 ery/uL   Final   pH, UA 05/29/2024 6.0  5.0 - 8.0 Final   Protein, UA 05/29/2024 Negative  Negative Final    Urobilinogen, UA 05/29/2024 0.2  0.2 or 1.0 E.U./dL Final   Nitrite, UA 90/96/7974 neg   Final   Leukocytes, UA 05/29/2024 Large (3+) (A)  Negative Final   WBC 05/29/2024 3.5 (L)  4.0 - 10.5 K/uL Final   RBC 05/29/2024 4.44  4.22 - 5.81 Mil/uL Final   Hemoglobin 05/29/2024 14.0  13.0 - 17.0 g/dL Final   HCT 90/96/7974 41.1  39.0 - 52.0 % Final   MCV 05/29/2024 92.5  78.0 - 100.0 fl Final   MCHC 05/29/2024 34.1  30.0 - 36.0 g/dL Final   RDW 90/96/7974 13.4  11.5 - 15.5 % Final   Platelets 05/29/2024 189.0  150.0 - 400.0 K/uL Final   Neutrophils Relative % 05/29/2024 65.3  43.0 - 77.0 % Final   Lymphocytes Relative 05/29/2024 20.9  12.0 - 46.0 % Final   Monocytes Relative 05/29/2024 10.9  3.0 - 12.0 % Final   Eosinophils Relative 05/29/2024 2.2  0.0 - 5.0 % Final   Basophils Relative 05/29/2024 0.7  0.0 - 3.0 % Final   Neutro Abs 05/29/2024 2.3  1.4 - 7.7 K/uL Final   Lymphs Abs 05/29/2024 0.7  0.7 - 4.0 K/uL Final   Monocytes Absolute 05/29/2024 0.4  0.1 - 1.0 K/uL Final   Eosinophils Absolute 05/29/2024 0.1  0.0 - 0.7 K/uL Final   Basophils Absolute 05/29/2024 0.0  0.0 - 0.1 K/uL Final   Sodium 05/29/2024 135  135 - 145 mEq/L Final   Potassium 05/29/2024 3.8  3.5 - 5.1 mEq/L Final   Chloride 05/29/2024 102  96 - 112 mEq/L Final   CO2 05/29/2024 27  19 - 32 mEq/L Final   Glucose, Bld 05/29/2024 85  70 - 99 mg/dL Final   BUN 90/96/7974 7  6 - 23 mg/dL Final   Creatinine, Ser 05/29/2024 0.66  0.40 - 1.50 mg/dL Final   GFR 90/96/7974 93.88  >60.00 mL/min Final   Calculated using the CKD-EPI Creatinine Equation (2021)   Calcium  05/29/2024 9.3  8.4 - 10.5 mg/dL Final   MICRO NUMBER: 90/96/7974 [id number]   Final   SPECIMEN QUALITY: 05/29/2024 Adequate   Final   Sample Source 05/29/2024 URINE   Final   STATUS: 05/29/2024 FINAL   Final   ISOLATE 1: 05/29/2024 Enterococcus faecalis (A)   Final   Greater than 100,000 CFU/mL of Enterococcus faecalis  Office Visit on 04/30/2024  Component  Date Value Ref Range Status   WBC 04/30/2024 4.8  4.0 - 10.5 K/uL Final   RBC 04/30/2024 4.37  4.22 - 5.81 Mil/uL Final   Hemoglobin 04/30/2024 13.8  13.0 - 17.0 g/dL Final   HCT 91/94/7974 40.3  39.0 - 52.0 % Final   MCV 04/30/2024 92.3  78.0 - 100.0 fl Final   MCHC 04/30/2024 34.3  30.0 - 36.0 g/dL Final   RDW 91/94/7974 13.5  11.5 - 15.5 % Final   Platelets 04/30/2024 273.0  150.0 - 400.0 K/uL Final   Neutrophils Relative % 04/30/2024 69.0  43.0 - 77.0 % Final   Lymphocytes Relative 04/30/2024 19.8  12.0 - 46.0 % Final   Monocytes Relative 04/30/2024 7.8  3.0 - 12.0 % Final   Eosinophils Relative 04/30/2024 2.2  0.0 - 5.0 % Final   Basophils Relative 04/30/2024 1.2  0.0 - 3.0 % Final  Neutro Abs 04/30/2024 3.3  1.4 - 7.7 K/uL Final   Lymphs Abs 04/30/2024 0.9  0.7 - 4.0 K/uL Final   Monocytes Absolute 04/30/2024 0.4  0.1 - 1.0 K/uL Final   Eosinophils Absolute 04/30/2024 0.1  0.0 - 0.7 K/uL Final   Basophils Absolute 04/30/2024 0.1  0.0 - 0.1 K/uL Final   Magnesium  04/30/2024 2.0  1.5 - 2.5 mg/dL Final   Sodium 91/94/7974 135  135 - 145 mEq/L Final   Potassium 04/30/2024 4.2  3.5 - 5.1 mEq/L Final   Chloride 04/30/2024 98  96 - 112 mEq/L Final   CO2 04/30/2024 29  19 - 32 mEq/L Final   Glucose, Bld 04/30/2024 87  70 - 99 mg/dL Final   BUN 91/94/7974 6  6 - 23 mg/dL Final   Creatinine, Ser 04/30/2024 0.65  0.40 - 1.50 mg/dL Final   Total Bilirubin 04/30/2024 0.6  0.2 - 1.2 mg/dL Final   Alkaline Phosphatase 04/30/2024 71  39 - 117 U/L Final   AST 04/30/2024 12  0 - 37 U/L Final   ALT 04/30/2024 21  0 - 53 U/L Final   Total Protein 04/30/2024 6.7  6.0 - 8.3 g/dL Final   Albumin 91/94/7974 4.4  3.5 - 5.2 g/dL Final   GFR 91/94/7974 94.37  >60.00 mL/min Final   Calculated using the CKD-EPI Creatinine Equation (2021)   Calcium  04/30/2024 9.9  8.4 - 10.5 mg/dL Final   Cholesterol 91/94/7974 160  0 - 200 mg/dL Final   ATP III Classification       Desirable:  < 200 mg/dL                Borderline High:  200 - 239 mg/dL          High:  > = 759 mg/dL   Triglycerides 91/94/7974 118.0  0.0 - 149.0 mg/dL Final   Normal:  <849 mg/dLBorderline High:  150 - 199 mg/dL   HDL 91/94/7974 43.99  >39.00 mg/dL Final   VLDL 91/94/7974 23.6  0.0 - 40.0 mg/dL Final   LDL Cholesterol 04/30/2024 81  0 - 99 mg/dL Final   Total CHOL/HDL Ratio 04/30/2024 3   Final                  Men          Women1/2 Average Risk     3.4          3.3Average Risk          5.0          4.42X Average Risk          9.6          7.13X Average Risk          15.0          11.0                       NonHDL 04/30/2024 104.47   Final   NOTE:  Non-HDL goal should be 30 mg/dL higher than patient's LDL goal (i.e. LDL goal of < 70 mg/dL, would have non-HDL goal of < 100 mg/dL)   PSA 04/30/2024 11.79 (H)  0.10 - 4.00 ng/mL Final   Test performed using [employer] PSA Assay, a parmagnetic partical, chemiluminecent immunoassay.   TSH 04/30/2024 0.83  0.35 - 5.50 uIU/mL Final   Vitamin B-12 04/30/2024 653  211 - 911 pg/mL Final   Vitamin B1 (Thiamine ) 04/30/2024 28  8 - 30 nmol/L Final   Comment: (Note) Vitamin supplementation within 24 hours prior to blood  draw may affect the accuracy of the results. . This test was developed and its analytical performance  characteristics have been determined by [employer]. It has not been cleared or approved by FDA.  This assay has been validated pursuant to the CLIA  regulations and is used for clinical purposes. . [facility] [address] [place] [address] [phone] Johanna Agent L. Frame, MD, PhD    VITD 04/30/2024 70.02  30.00 - 100.00 ng/mL Final   Valproic Acid  Lvl 04/30/2024 48.8 (L)  50.0 - 100.0 mg/L Final   QUESTION/PROBLEM: 04/30/2024    Final   Comment: . Please clarify the following specimen source/type submitted. .    QUESTION: 04/30/2024 VERIFY SOURCE OF OT   Final   Comment: REQUESTED INFORMATION  _________________________________ . AUTHORIZED SIGNATURE __________________________________ . TO PREVENT FURTHER DELAYS IN TESTING, PLEASE COMPLETE INFORMATION ABOVE AND EITHER FAX TO [phone] OR  EMAIL TO [email or web address] TO  RESOLVE THIS ORDER.   There may be more visits with results that are not included.    Blood Alcohol level:  Lab Results  Component Value Date   [facility] <15 10/13/2024   ETH <5 04/11/2017    Metabolic Disorder Labs: Lab Results  Component Value Date   HGBA1C 5.5 10/13/2024   MPG 111.15 10/13/2024   No results found for: PROLACTIN Lab Results  Component Value Date   CHOL 156 10/13/2024   TRIG 63 10/13/2024   HDL 63 10/13/2024   CHOLHDL 2.5 10/13/2024   VLDL 13 10/13/2024   LDLCALC 80 10/13/2024   LDLCALC 81 04/30/2024    Therapeutic Lab Levels: No results found for: LITHIUM Lab Results  Component Value Date   VALPROATE 49 (L) 10/13/2024   VALPROATE 48.8 (L) 04/30/2024   No results found for: CBMZ  Physical Findings   AIMS    Flowsheet Row Ad[facility] (Discharged) [facility]  AIMS Total Score 0   AUDIT    Flowsheet Row Clinical Support from 05/09/2023 in [facility] Clinical Support from 05/05/2022 in [facility] Admission (Discharged) from OP [facility]  Alcohol Use Disorder Identification Test Final Score (AUDIT) 9 4 5    GAD-7    Flowsheet Row Office Visit from 10/09/2024 in [facility] Office Visit from 10/02/2024 in [facility] [facility] Office Visit from 09/30/2024 in [facility] [place] [facility] Office Visit from 08/27/2024 in [facility] Office Visit from 05/17/2024 in [facility] [place] [facility]  Total GAD-7 Score 7 7 7 7 21    Mini-Mental    Flowsheet Row Clinical Support from 03/18/2019 in [facility] Clinical Support from 03/14/2018 in [facility] [place] [facility] Clinical Support from 03/08/2017 in [facility]  Total Score (max 30 points ) 17 20 18    PHQ2-9    Flowsheet Row Office Visit from 10/09/2024 in [facility] Visit from 10/02/2024 in [facility] Office Visit from 09/30/2024 in [facility] [place] [facility] Office Visit from 08/27/2024 in [facility] at [employer] Visit from 05/29/2024 in [facility] [place]  PHQ-2 Total Score 1 1 1 1 3   PHQ-9 Total Score 7 2 3 6 21    Flowsheet Row ED from 10/14/2024 in [facility] Most recent reading at 10/14/2024  5:25 AM ED from 10/14/2024 in [facility] at [facility] Most recent reading at 10/14/2024  1:57 AM ED from 10/13/2024 in [facility] Most recent reading at 10/13/2024  1:52 PM  C-SSRS RISK CATEGORY No Risk No Risk No Risk     Musculoskeletal  Strength & Muscle Tone: within normal limits Gait & Station: normal Patient leans: N/A  Psychiatric Specialty Exam  Presentation  General Appearance:  Appropriate for Environment  Eye Contact: Fair  Speech: Clear and Coherent  Speech Volume: Decreased  Handedness: Right   Mood and Affect  Mood: Anxious  Affect: Congruent   Thought Process  Thought Processes: Disorganized  Descriptions of Associations:Circumstantial  Orientation:Full (Time, Place and Person)  Thought Content:Illogical  Diagnosis of Schizophrenia or Schizoaffective disorder in past: Yes  Duration of Psychotic Symptoms: N/A   Hallucinations:Hallucinations: None  Ideas of  Reference:None  Suicidal Thoughts:Suicidal Thoughts: No  Homicidal Thoughts:Homicidal Thoughts: No   Sensorium  Memory: Immediate Fair  Judgment: Poor  Insight: Poor   Executive Functions  Concentration: Poor  Attention Span: Fair  Recall: [employer] of Knowledge: Fair  Language: Fair   Psychomotor Activity  Psychomotor Activity: Psychomotor Activity: Normal   Assets  Assets: Desire for Improvement   Sleep  Sleep: Sleep: Poor  No Safety Checks orders active in given range  No data recorded  Physical Exam  Physical Exam Vitals and nursing note reviewed.  Constitutional:      Appearance: Normal appearance.  Eyes:     Pupils: Pupils are equal, round, and reactive to light.  Musculoskeletal:     Cervical back: Normal range of motion.  Neurological:     General: No focal deficit present.     Mental Status: He is alert and oriented to person, place, and time.    Review of Systems  Psychiatric/Behavioral:  Positive for depression. Negative for hallucinations, memory loss, substance abuse and suicidal ideas. The patient is nervous/anxious and has insomnia.   All other systems reviewed and are negative.  Blood pressure 134/84, pulse 67, temperature 97.8 F (36.6 C), temperature source Oral, resp. rate 18, SpO2 100%. There is no height or weight on file to calculate BMI.  Treatment Plan Summary: Daily contact with patient to assess and evaluate symptoms and progress in treatment and Medication management  Meds  amLODipine   2.5 mg Oral Daily   aspirin   81 mg Oral Daily   B-complex with vitamin C  1 tablet Oral QODAY   calcium  carbonate  1 tablet Oral Q breakfast   cholecalciferol   1,000 Units Oral q AM   divalproex   750 mg Oral QHS   melatonin  5 mg Oral QHS   neomycin -bacitracin -polymyxin  1 Application Topical Once   OLANZapine  zydis  5 mg Oral BID   tamsulosin   0.4 mg Oral Daily    PRNS -Continue Tylenol  650 mg every 6 hours PRN for  mild pain -Continue Maalox 30 mg every 4 hrs PRN for indigestion -Continue Milk of Magnesia as needed every 6 hrs for constipation -Continue Abuterol inhaler every 6 hours PRN for wheezing/SOB  Labs: Reviewed: Will repeat BMP in the morning  Discharge Planning:  Social work and case management to assist with discharge planning and identification of hospital follow-up needs prior to discharge Estimated LOS: 5-7 days Discharge Concerns: Need to establish a safety plan; Medication compliance and effectiveness Discharge Goals: Return home with outpatient referrals for mental health follow-up including medication management/psychotherapy  Donia Snell, NP 10/14/2024 6:59 PM

## 2024-10-15 LAB — BASIC METABOLIC PANEL WITH GFR
Anion gap: 11 (ref 5–15)
BUN: 11 mg/dL (ref 8–23)
CO2: 25 mmol/L (ref 22–32)
Calcium: 9.7 mg/dL (ref 8.9–10.3)
Chloride: 96 mmol/L — ABNORMAL LOW (ref 98–111)
Creatinine, Ser: 0.73 mg/dL (ref 0.61–1.24)
GFR, Estimated: >60 mL/min
Glucose, Bld: 101 mg/dL — ABNORMAL HIGH (ref 70–99)
Potassium: 4.5 mmol/L (ref 3.5–5.1)
Sodium: 132 mmol/L — ABNORMAL LOW (ref 135–145)

## 2024-10-15 LAB — VITAMIN D 25 HYDROXY (VIT D DEFICIENCY, FRACTURES): Vit D, 25-Hydroxy: 56.6 ng/mL (ref 30–100)

## 2024-10-15 LAB — VITAMIN B12: Vitamin B-12: 728 pg/mL (ref 180–914)

## 2024-10-15 MED ORDER — OLANZAPINE 7.5 MG PO TABS
15.0000 mg | ORAL_TABLET | Freq: Every day | ORAL | Status: DC
  Administered 2024-10-15: 15 mg via ORAL
  Filled 2024-10-15: qty 2

## 2024-10-15 MED ORDER — HYDROXYZINE HCL 25 MG PO TABS
25.0000 mg | ORAL_TABLET | Freq: Three times a day (TID) | ORAL | Status: DC | PRN
  Administered 2024-10-15 – 2024-10-16 (×3): 25 mg via ORAL
  Filled 2024-10-15 (×3): qty 1

## 2024-10-15 MED ORDER — DIVALPROEX SODIUM ER 500 MG PO TB24
750.0000 mg | ORAL_TABLET | Freq: Every day | ORAL | Status: DC
  Administered 2024-10-15: 750 mg via ORAL
  Filled 2024-10-15: qty 1

## 2024-10-15 MED ORDER — DIVALPROEX SODIUM ER 500 MG PO TB24: 1000.0000 mg | ORAL_TABLET | Freq: Every day | ORAL | Status: DC

## 2024-10-15 MED ORDER — OLANZAPINE 10 MG PO TABS: 10.0000 mg | ORAL_TABLET | Freq: Every day | ORAL | Status: DC

## 2024-10-15 NOTE — ED Provider Notes (Signed)
 Behavioral Health Progress Note  Date and Time: 10/15/2024 1:32 PM Name: Robert Fowler MRN:  [id number]  HPI: Per triage: Robert Fowler is a 64Y male presenting to [facility] as a vol walk-in with his family. Pt states he is here today because he feels like he is taking too many medications. Pt states he is also afriad he is getting dementia because his mother died of it and he has started to stutter his words a lot. Pt states he has been feeling very hyper and has not been getting much sleep. Pt states he has an a hx of past inpatient stays going back to 1968. Pt states he just needs something that will calm him down. Pt has a hx of schizophrenia and bipolar. Pt is seeing a therapist and taking medicine, Pt denies SI, HI, and substance use. [name], [name] RAMAN, NT,Date of Service: 10/13/2024 11:35 AM).  Patient was assessed by provider, determined to have manic type symptoms, and to meet criteria for an inpatient hospitalization, and those recommendations were made. Overnight, pt was sent to the ER due to urinary retention, and was returned to the [facility] with an indwelling urinary catheter. Notable labs include mild hyponatremia (133) and UDS+marijuana  Patient assessment 10/15/24 Patient seen and evaluated at bedside. Denies SI/HI/AVH but is tangential throughout assessment and at times difficult to redirect. He does endorse racing thoughts and at times appears restless. He report feeling anxious at this time but remains amenable for psychiatric admission.   Diagnosis:  Final diagnoses:  Schizoaffective disorder, bipolar type (HCC)    Total Time spent with patient: 45 minutes  Past Psychiatric History: Schizoaffective d/o Past Medical History:  Past Medical History:  Diagnosis Date   Arthritis    BENIGN PROSTATIC HYPERTROPHY 07/18/2007   Bipolar disorder (HCC)    Habitual alcohol use    Lesion of oral mucosa 03/21/2018   Biopsy 03/2018 [place]) - INFLAMMATORY FIBROUS HYPERPLASIA CONSISTENT  WITH DENTURE INJURY (EPULIS FISSURATUM).    OSTEOPOROSIS 09/04/2007   DEXA -2.8 lumbar (03/2015)   Pes planus    Prediabetes 2014   Schizoaffective disorder (HCC)    h/o paranoid schizophrenia   Wears dentures    full upper and lower    Family History:  Family History  Problem Relation Age of Onset   COPD Mother        smoker   Dementia Mother    Alcohol abuse Father    Alcohol abuse Maternal Grandmother    CAD Neg Hx    Stroke Neg Hx    Cancer Neg Hx    Diabetes Neg Hx    Hypertension Neg Hx     Family Psychiatric  History: not provided Social History:  Social History   Socioeconomic History   Marital status: Single    Spouse name: Not on file   Number of children: Not on file   Years of education: Not on file   Highest education level: Not on file  Occupational History   Not on file  Tobacco Use   Smoking status: Former    Current packs/day: 0.00    Types: Cigarettes    Quit date: 09/26/2006    Years since quitting: 18.0   Smokeless tobacco: Never  Vaping Use   Vaping status: Never Used  Substance and Sexual Activity   Alcohol use: Not Currently    Comment: quit   Drug use: Yes    Types: Marijuana    Comment: last done 1/14 bc anxiety   Sexual  activity: Not Currently  Other Topics Concern   Not on file  Social History Narrative   Lives alone.     Mother and brother live nearby.   Occupation: copy at [employer]   Edu: 12th grade   Activity: mows yard   Diet: good water, fruits/vegetables daily      Disability evaluation - no disability (04/2014)   Social Drivers of Health   Tobacco Use: Medium Risk (10/14/2024)   Patient History    Smoking Tobacco Use: Former    Smokeless Tobacco Use: Never    Passive Exposure: Not on file  Financial Resource Strain: Low Risk (05/14/2024)   Overall Financial Resource Strain (CARDIA)    Difficulty of Paying Living Expenses: Not hard at all  Food Insecurity: No Food Insecurity (10/14/2024)   Epic    Worried  About Programme Researcher, Broadcasting/film/video in the Last Year: Never true    Ran Out of Food in the Last Year: Never true  Transportation Needs: No Transportation Needs (10/14/2024)   Epic    Lack of Transportation (Medical): No    Lack of Transportation (Non-Medical): No  Physical Activity: Inactive (05/14/2024)   Exercise Vital Sign    Days of Exercise per Week: 0 days    Minutes of Exercise per Session: 0 min  Stress: No Stress Concern Present (05/14/2024)   [employer] of Occupational Health - Occupational Stress Questionnaire    Feeling of Stress: Not at all  Social Connections: Moderately Integrated (05/14/2024)   Social Connection and Isolation Panel    Frequency of Communication with Friends and Family: Twice a week    Frequency of Social Gatherings with Friends and Family: Once a week    Attends Religious Services: More than 4 times per year    Active Member of Clubs or Organizations: Yes    Attends Banker Meetings: More than 4 times per year    Marital Status: Never married  Intimate Partner Violence: Not At Risk (10/14/2024)   Epic    Fear of Current or Ex-Partner: No    Emotionally Abused: No    Physically Abused: No    Sexually Abused: No  Depression (PHQ2-9): Medium Risk (10/09/2024)   Depression (PHQ2-9)    PHQ-2 Score: 7  Alcohol Screen: Low Risk (08/27/2024)   Alcohol Screen    Last Alcohol Screening Score (AUDIT): 0  Housing: Low Risk (05/14/2024)   Epic    Unable to Pay for Housing in the Last Year: No    Number of Times Moved in the Last Year: 0    Homeless in the Last Year: No  Utilities: Not At Risk (10/14/2024)   Epic    Threatened with loss of utilities: No  Health Literacy: Adequate Health Literacy (05/14/2024)   B1300 Health Literacy    Frequency of need for help with medical instructions: Never    Sleep: Poor  Appetite:  Fair  Current Medications:  Current Facility-Administered Medications  Medication Dose Route Frequency Provider Last Rate Last  Admin   acetaminophen  (TYLENOL ) tablet 650 mg  650 mg Oral Q6H PRN Bobbitt, Shalon E, NP       alum & mag hydroxide-simeth (MAALOX/MYLANTA) 200-200-20 MG/5ML suspension 30 mL  30 mL Oral Q4H PRN Bobbitt, Shalon E, NP       amLODipine  (NORVASC ) tablet 2.5 mg  2.5 mg Oral Daily Bobbitt, Shalon E, NP   2.5 mg at 10/15/24 0929   aspirin  chewable tablet 81 mg  81 mg Oral Daily  Bobbitt, Shalon E, NP   81 mg at 10/15/24 9070   B-complex with vitamin C tablet 1 tablet  1 tablet Oral QODAY Bobbitt, Shalon E, NP   1 tablet at 10/14/24 9075   calcium  carbonate (OS-CAL - dosed in mg of elemental calcium ) tablet 1,250 mg  1 tablet Oral Q breakfast Bobbitt, Shalon E, NP   1,250 mg at 10/15/24 0751   cholecalciferol  (VITAMIN D3) 25 MCG (1000 UNIT) tablet 1,000 Units  1,000 Units Oral q AM Bobbitt, Shalon E, NP   1,000 Units at 10/15/24 9070   divalproex  (DEPAKOTE  ER) 24 hr tablet 1,000 mg  1,000 mg Oral QHS [name], MD       magnesium  hydroxide (MILK OF MAGNESIA) suspension 30 mL  30 mL Oral Daily PRN Bobbitt, Shalon E, NP       melatonin tablet 5 mg  5 mg Oral QHS Nkwenti, Doris, NP   5 mg at 10/14/24 2144   neomycin -bacitracin -polymyxin [phone] OINT 1 Application  1 Application Topical Once Bobbitt, Shalon E, NP       OLANZapine  (ZYPREXA ) injection 5 mg  5 mg Intramuscular TID PRN Bobbitt, Shalon E, NP       OLANZapine  (ZYPREXA ) tablet 10 mg  10 mg Oral QHS [name], MD       OLANZapine  zydis (ZYPREXA ) disintegrating tablet 5 mg  5 mg Oral TID PRN Bobbitt, Shalon E, NP       tamsulosin  (FLOMAX ) capsule 0.4 mg  0.4 mg Oral Daily Bobbitt, Shalon E, NP   0.4 mg at 10/15/24 9068   Current Outpatient Medications  Medication Sig Dispense Refill   acetaminophen  (TYLENOL ) 500 MG tablet Take 500 mg by mouth at bedtime.     amLODipine  (NORVASC ) 2.5 MG tablet Take 1 tablet (2.5 mg total) by mouth daily. 90 tablet 3   amoxicillin -clavulanate (AUGMENTIN ) 875-125 MG tablet Take 1 tablet by mouth 2 (two) times  daily. (Patient taking differently: Take 1 tablet by mouth 2 (two) times daily. Take for 10 days starting on 10/03/24) 20 tablet 0   aspirin  EC 81 MG tablet Take 81 mg by mouth daily. Swallow whole.     B Complex Vitamins (VITAMIN B COMPLEX  PO) Take 1 capsule by mouth every other day.     benztropine  (COGENTIN ) 0.5 MG tablet Take 0.5 mg by mouth at bedtime.     Calcium  Citrate 333 MG TABS Take 333 mg by mouth daily.     cephALEXin  (KEFLEX ) 500 MG capsule Take 500 mg by mouth 4 (four) times daily. Take for 7 days starting on 09/27/24     Cholecalciferol  (VITAMIN D3 PO) Take 1 capsule by mouth in the morning.     divalproex  (DEPAKOTE  ER) 250 MG 24 hr tablet Take 750 mg by mouth at bedtime.     FLUoxetine  (PROZAC ) 20 MG capsule Take 20 mg by mouth in the morning.     hydrOXYzine  (ATARAX /VISTARIL ) 25 MG tablet Take 25 mg by mouth in the morning and at bedtime.     OLANZapine  zydis (ZYPREXA ) 10 MG disintegrating tablet Take 10 mg by mouth at bedtime.     OLANZapine  zydis (ZYPREXA ) 5 MG disintegrating tablet Take 5 mg by mouth daily.     tadalafil  (CIALIS ) 5 MG tablet Take 1 tablet (5 mg total) by mouth daily. (Patient taking differently: Take 5 mg by mouth at bedtime as needed for erectile dysfunction.) 10 tablet 0   tamsulosin  (FLOMAX ) 0.4 MG CAPS capsule Take 1 capsule (0.4 mg  total) by mouth daily. 90 capsule 3    Labs  Lab Results:  Admission on 10/14/2024  Component Date Value Ref Range Status   POC Amphetamine UR 10/14/2024 None Detected  NONE DETECTED (Cut Off Level 1000 ng/mL) Final   POC Secobarbital (BAR) 10/14/2024 None Detected  NONE DETECTED (Cut Off Level 300 ng/mL) Final   POC Buprenorphine (BUP) 10/14/2024 None Detected  NONE DETECTED (Cut Off Level 10 ng/mL) Final   POC Oxazepam (BZO) 10/14/2024 None Detected  NONE DETECTED (Cut Off Level 300 ng/mL) Final   POC Cocaine UR 10/14/2024 None Detected  NONE DETECTED (Cut Off Level 300 ng/mL) Final   POC Methamphetamine UR 10/14/2024 None  Detected  NONE DETECTED (Cut Off Level 1000 ng/mL) Final   POC Morphine  10/14/2024 None Detected  NONE DETECTED (Cut Off Level 300 ng/mL) Final   POC Methadone UR 10/14/2024 None Detected  NONE DETECTED (Cut Off Level 300 ng/mL) Final   POC Oxycodone  UR 10/14/2024 None Detected  NONE DETECTED (Cut Off Level 100 ng/mL) Final   POC Marijuana UR 10/14/2024 Positive (A)  NONE DETECTED (Cut Off Level 50 ng/mL) Final  Admission on 10/14/2024, Discharged on 10/14/2024  Component Date Value Ref Range Status   WBC 10/14/2024 6.0  4.0 - 10.5 K/uL Final   RBC 10/14/2024 4.48  4.22 - 5.81 MIL/uL Final   Hemoglobin 10/14/2024 12.9 (L)  13.0 - 17.0 g/dL Final   HCT 98/80/7973 37.4 (L)  39.0 - 52.0 % Final   MCV 10/14/2024 83.5  80.0 - 100.0 fL Final   MCH 10/14/2024 28.8  26.0 - 34.0 pg Final   MCHC 10/14/2024 34.5  30.0 - 36.0 g/dL Final   RDW 98/80/7973 14.5  11.5 - 15.5 % Final   Platelets 10/14/2024 207  150 - 400 K/uL Final   nRBC 10/14/2024 0.0  0.0 - 0.2 % Final   Neutrophils Relative % 10/14/2024 67  % Final   Neutro Abs 10/14/2024 4.0  1.7 - 7.7 K/uL Final   Lymphocytes Relative 10/14/2024 20  % Final   Lymphs Abs 10/14/2024 1.2  0.7 - 4.0 K/uL Final   Monocytes Relative 10/14/2024 10  % Final   Monocytes Absolute 10/14/2024 0.6  0.1 - 1.0 K/uL Final   Eosinophils Relative 10/14/2024 2  % Final   Eosinophils Absolute 10/14/2024 0.1  0.0 - 0.5 K/uL Final   Basophils Relative 10/14/2024 1  % Final   Basophils Absolute 10/14/2024 0.0  0.0 - 0.1 K/uL Final   Immature Granulocytes 10/14/2024 0  % Final   Abs Immature Granulocytes 10/14/2024 0.02  0.00 - 0.07 K/uL Final   Performed at [facility], [address], [place], [place] [address]   Sodium 10/14/2024 133 (L)  135 - 145 mmol/L Final   Potassium 10/14/2024 4.1  3.5 - 5.1 mmol/L Final   Chloride 10/14/2024 98  98 - 111 mmol/L Final   CO2 10/14/2024 24  22 - 32 mmol/L Final   Glucose, Bld 10/14/2024 103 (H)  70 - 99  mg/dL Final   Glucose reference range applies only to samples taken after fasting for at least 8 hours.   BUN 10/14/2024 14  8 - 23 mg/dL Final   Creatinine, Ser 10/14/2024 0.76  0.61 - 1.24 mg/dL Final   Calcium  10/14/2024 9.6  8.9 - 10.3 mg/dL Final   GFR, Estimated 10/14/2024 >60  >60 mL/min Final   Comment: (NOTE) Calculated using the CKD-EPI Creatinine Equation (2021)    Anion gap  10/14/2024 11  5 - 15 Final   Performed at [facility], [address] [place], [place] [address]   Color, Urine 10/14/2024 YELLOW  YELLOW Final   APPearance 10/14/2024 CLEAR  CLEAR Final   Specific Gravity, Urine 10/14/2024 1.015  1.005 - 1.030 Final   pH 10/14/2024 6.0  5.0 - 8.0 Final   Glucose, UA 10/14/2024 NEGATIVE  NEGATIVE mg/dL Final   Hgb urine dipstick 10/14/2024 NEGATIVE  NEGATIVE Final   Bilirubin Urine 10/14/2024 NEGATIVE  NEGATIVE Final   Ketones, ur 10/14/2024 5 (A)  NEGATIVE mg/dL Final   Protein, ur 98/80/7973 NEGATIVE  NEGATIVE mg/dL Final   Nitrite 98/80/7973 NEGATIVE  NEGATIVE Final   Leukocytes,Ua 10/14/2024 NEGATIVE  NEGATIVE Final   RBC / HPF 10/14/2024 0-5  0 - 5 RBC/hpf Final   WBC, UA 10/14/2024 0-5  0 - 5 WBC/hpf Final   Bacteria, UA 10/14/2024 RARE (A)  NONE SEEN Final   Squamous Epithelial / HPF 10/14/2024 0-5  0 - 5 /HPF Final   Mucus 10/14/2024 PRESENT   Final   Performed at [facility], [address] [place], [place] [address]  Admission on 10/13/2024, Discharged on 10/14/2024  Component Date Value Ref Range Status   SARS Coronavirus 2 by RT PCR 10/13/2024 NEGATIVE  NEGATIVE Final   Performed at [facility], [address], [place], [place] [address]   WBC 10/13/2024 6.3  4.0 - 10.5 K/uL Final   RBC 10/13/2024 4.58  4.22 - 5.81 MIL/uL Final   Hemoglobin 10/13/2024 13.4  13.0 - 17.0 g/dL Final   HCT 98/81/7973 37.6 (L)  39.0 - 52.0 % Final   MCV 10/13/2024 82.1  80.0 - 100.0 fL Final   MCH 10/13/2024 29.3  26.0 - 34.0  pg Final   MCHC 10/13/2024 35.6  30.0 - 36.0 g/dL Final   RDW 98/81/7973 14.4  11.5 - 15.5 % Final   Platelets 10/13/2024 227  150 - 400 K/uL Final   nRBC 10/13/2024 0.0  0.0 - 0.2 % Final   Neutrophils Relative % 10/13/2024 68  % Final   Neutro Abs 10/13/2024 4.3  1.7 - 7.7 K/uL Final   Lymphocytes Relative 10/13/2024 18  % Final   Lymphs Abs 10/13/2024 1.1  0.7 - 4.0 K/uL Final   Monocytes Relative 10/13/2024 10  % Final   Monocytes Absolute 10/13/2024 0.6  0.1 - 1.0 K/uL Final   Eosinophils Relative 10/13/2024 2  % Final   Eosinophils Absolute 10/13/2024 0.2  0.0 - 0.5 K/uL Final   Basophils Relative 10/13/2024 1  % Final   Basophils Absolute 10/13/2024 0.1  0.0 - 0.1 K/uL Final   Immature Granulocytes 10/13/2024 1  % Final   Abs Immature Granulocytes 10/13/2024 0.04  0.00 - 0.07 K/uL Final   Performed at [facility], [address], [place], [place] [address]   Sodium 10/13/2024 135  135 - 145 mmol/L Final   Potassium 10/13/2024 4.3  3.5 - 5.1 mmol/L Final   Chloride 10/13/2024 99  98 - 111 mmol/L Final   CO2 10/13/2024 23  22 - 32 mmol/L Final   Glucose, Bld 10/13/2024 91  70 - 99 mg/dL Final   Glucose reference range applies only to samples taken after fasting for at least 8 hours.   BUN 10/13/2024 11  8 - 23 mg/dL Final   Creatinine, Ser 10/13/2024 0.80  0.61 - 1.24 mg/dL Final   Calcium  10/13/2024 9.7  8.9 -  10.3 mg/dL Final   Total Protein 98/81/7973 7.2  6.5 - 8.1 g/dL Final   Albumin 98/81/7973 4.3  3.5 - 5.0 g/dL Final   AST 98/81/7973 13 (L)  15 - 41 U/L Final   ALT 10/13/2024 12  0 - 44 U/L Final   Alkaline Phosphatase 10/13/2024 87  38 - 126 U/L Final   Total Bilirubin 10/13/2024 0.7  0.0 - 1.2 mg/dL Final   GFR, Estimated 10/13/2024 >60  >60 mL/min Final   Comment: (NOTE) Calculated using the CKD-EPI Creatinine Equation (2021)    Anion gap 10/13/2024 13  5 - 15 Final   Performed at [facility], [address], [place], [place] [address]   Hgb A1c  MFr Bld 10/13/2024 5.5  4.8 - 5.6 % Final   Comment: (NOTE) Diagnosis of Diabetes The following HbA1c ranges recommended by the American Diabetes Association (ADA) may be used as an aid in the diagnosis of diabetes mellitus.  Hemoglobin             Suggested A1C NGSP%              Diagnosis  <5.7                   Non Diabetic  5.7-6.4                Pre-Diabetic  >6.4                   Diabetic  <7.0                   Glycemic control for                       adults with diabetes.     Mean Plasma Glucose 10/13/2024 111.15  mg/dL Final   Performed at [facility], [address], [place], [place] [address]   Alcohol, Ethyl (B) 10/13/2024 <15  <15 mg/dL Final   Comment: (NOTE) For medical purposes only. Performed at [facility], [address] [place], [place] [address]    Cholesterol 10/13/2024 156  0 - 200 mg/dL Final   Comment:        ATP III CLASSIFICATION:  <200     mg/dL   Desirable  799-760  mg/dL   Borderline High  >=759    mg/dL   High           Triglycerides 10/13/2024 63  <150 mg/dL Final   HDL 98/81/7973 63  >40 mg/dL Final   Total CHOL/HDL Ratio 10/13/2024 2.5  RATIO Final   VLDL 10/13/2024 13  0 - 40 mg/dL Final   LDL Cholesterol 10/13/2024 80  0 - 99 mg/dL Final   Comment:        Total Cholesterol/HDL:CHD Risk Coronary Heart Disease Risk Table                     Men   Women  1/2 Average Risk   3.4   3.3  Average Risk       5.0   4.4  2 X Average Risk   9.6   7.1  3 X Average Risk  23.4   11.0        Use the calculated Patient Ratio above and the CHD Risk Table to determine the patient's CHD Risk.        ATP III CLASSIFICATION (LDL):  <100  mg/dL   Optimal  899-870  mg/dL   Near or Above                    Optimal  130-159  mg/dL   Borderline  839-810  mg/dL   High  >809     mg/dL   Very High Performed at [facility], [address], [place], [place] [address]    TSH 10/13/2024 1.380  0.350 - 4.500 uIU/mL Final    Performed at [facility], [address] [place], [place] [address]   Valproic Acid  Lvl 10/13/2024 49 (L)  50 - 100 ug/mL Final   Performed at [facility], [address], [place], [place] [address]  Office Visit on 10/09/2024  Component Date Value Ref Range Status   Sodium 10/09/2024 130 (L)  135 - 145 mEq/L Final   Potassium 10/09/2024 4.2  3.5 - 5.1 mEq/L Final   Chloride 10/09/2024 95 (L)  96 - 112 mEq/L Final   CO2 10/09/2024 30  19 - 32 mEq/L Final   Elevated LDH levels may cause falsely increased CO2 results. If LDH is >2000 U/L, a positive bias of 12% is possible.   Glucose, Bld 10/09/2024 89  70 - 99 mg/dL Final   BUN 98/85/7973 6  6 - 23 mg/dL Final   Creatinine, Ser 10/09/2024 0.67  0.40 - 1.50 mg/dL Final   Total Bilirubin 10/09/2024 0.5  0.2 - 1.2 mg/dL Final   Alkaline Phosphatase 10/09/2024 77  39 - 117 U/L Final   AST 10/09/2024 9  5 - 37 U/L Final   ALT 10/09/2024 13  3 - 53 U/L Final   Total Protein 10/09/2024 7.1  6.0 - 8.3 g/dL Final   Albumin 98/85/7973 4.1  3.5 - 5.2 g/dL Final   GFR 98/85/7973 93.22  >60.00 mL/min Final   Calculated using the CKD-EPI Creatinine Equation (2021)   Calcium  10/09/2024 9.8  8.4 - 10.5 mg/dL Final   WBC 98/85/7973 5.0  4.0 - 10.5 K/uL Final   RBC 10/09/2024 4.44  4.22 - 5.81 Mil/uL Final   Hemoglobin 10/09/2024 13.0  13.0 - 17.0 g/dL Final   HCT 98/85/7973 37.1 (L)  39.0 - 52.0 % Final   MCV 10/09/2024 83.5  78.0 - 100.0 fl Final   MCHC 10/09/2024 34.9  30.0 - 36.0 g/dL Final   RDW 98/85/7973 14.5  11.5 - 15.5 % Final   Platelets 10/09/2024 259.0  150.0 - 400.0 K/uL Final   Neutrophils Relative % 10/09/2024 63.3  43.0 - 77.0 % Final   Lymphocytes Relative 10/09/2024 20.5  12.0 - 46.0 % Final   Monocytes Relative 10/09/2024 10.2  3.0 - 12.0 % Final   Eosinophils Relative 10/09/2024 4.9  0.0 - 5.0 % Final   Basophils Relative 10/09/2024 1.1  0.0 - 3.0 % Final   Neutro Abs 10/09/2024 3.2  1.4 - 7.7 K/uL Final   Lymphs  Abs 10/09/2024 1.0  0.7 - 4.0 K/uL Final   Monocytes Absolute 10/09/2024 0.5  0.1 - 1.0 K/uL Final   Eosinophils Absolute 10/09/2024 0.2  0.0 - 0.7 K/uL Final   Basophils Absolute 10/09/2024 0.1  0.0 - 0.1 K/uL Final  Admission on 10/03/2024, Discharged on 10/03/2024  Component Date Value Ref Range Status   Specimen Description 10/03/2024    Final                   Value:FLUID Performed at [facility], [address] [place], [place] [address]    Special Requests 10/03/2024    Final                   Value:NONE Performed at [facility], [address], [place], [place] [address]    Gram Stain 10/03/2024    Final                   Value:NO WBC SEEN NO ORGANISMS SEEN    Culture 10/03/2024    Final                   Value:RARE STAPHYLOCOCCUS AUREUS NO ANAEROBES ISOLATED Performed at [facility], [address] [place], [place] [address]    Report Status 10/03/2024 10/08/2024 FINAL   Final   Organism ID, Bacteria 10/03/2024 STAPHYLOCOCCUS AUREUS   Final  Office Visit on 09/30/2024  Component Date Value Ref Range Status   MICRO NUMBER: 09/30/2024 [id number]   Final   SPECIMEN QUALITY: 09/30/2024 Adequate   Final   SOURCE: 09/30/2024 LEFT ANTERIOR KNEE   Final   STATUS: 09/30/2024 FINAL   Final   AER ISOLATE 1: 09/30/2024 Staphylococcus aureus (A)   Final   Scant growth of Staphylococcus aureus Negative for inducible clindamycin  resistance.  Admission on 09/14/2024, Discharged on 09/14/2024  Component Date Value Ref Range Status   Color, Urine 09/14/2024 YELLOW  YELLOW Final   APPearance 09/14/2024 CLOUDY (A)  CLEAR Final   Specific Gravity, Urine 09/14/2024 1.004 (L)  1.005 - 1.030 Final   pH 09/14/2024 7.0  5.0 - 8.0 Final   Glucose, UA 09/14/2024 NEGATIVE  NEGATIVE mg/dL Final   Hgb urine dipstick 09/14/2024 MODERATE (A)  NEGATIVE Final   Bilirubin Urine 09/14/2024 NEGATIVE  NEGATIVE Final   Ketones, ur 09/14/2024 NEGATIVE   NEGATIVE mg/dL Final   Protein, ur 87/79/7974 NEGATIVE  NEGATIVE mg/dL Final   Nitrite 87/79/7974 NEGATIVE  NEGATIVE Final   Leukocytes,Ua 09/14/2024 LARGE (A)  NEGATIVE Final   RBC / HPF 09/14/2024 11-20  0 - 5 RBC/hpf Final   WBC, UA 09/14/2024 >50  0 - 5 WBC/hpf Final   Bacteria, UA 09/14/2024 RARE (A)  NONE SEEN Final   Squamous Epithelial / HPF 09/14/2024 0-5  0 - 5 /HPF Final   WBC Clumps 09/14/2024 PRESENT   Final   Non Squamous Epithelial 09/14/2024 0-5 (A)  NONE SEEN Final   Performed at [facility], [address] [place], [place] [address]   Sodium 09/14/2024 132 (L)  135 - 145 mmol/L Final   Potassium 09/14/2024 4.0  3.5 - 5.1 mmol/L Final   Chloride 09/14/2024 97 (L)  98 - 111 mmol/L Final   CO2 09/14/2024 23  22 - 32 mmol/L Final   Glucose, Bld 09/14/2024 103 (H)  70 - 99 mg/dL Final   Glucose reference range applies only to samples taken after fasting for at least 8 hours.   BUN 09/14/2024 6 (L)  8 - 23 mg/dL Final   Creatinine, Ser 09/14/2024 0.65  0.61 - 1.24 mg/dL Final   Calcium  09/14/2024 9.7  8.9 - 10.3 mg/dL Final   GFR, Estimated 09/14/2024 >60  >60 mL/min Final   Comment: (NOTE) Calculated using the CKD-EPI Creatinine Equation (2021)    Anion gap 09/14/2024 11  5 - 15 Final   Performed at [facility], [address] [place], [place] [address]   WBC 09/14/2024 10.0  4.0 -  10.5 K/uL Final   RBC 09/14/2024 4.57  4.22 - 5.81 MIL/uL Final   Hemoglobin 09/14/2024 13.3  13.0 - 17.0 g/dL Final   HCT 87/79/7974 38.1 (L)  39.0 - 52.0 % Final   MCV 09/14/2024 83.4  80.0 - 100.0 fL Final   MCH 09/14/2024 29.1  26.0 - 34.0 pg Final   MCHC 09/14/2024 34.9  30.0 - 36.0 g/dL Final   RDW 87/79/7974 14.0  11.5 - 15.5 % Final   Platelets 09/14/2024 189  150 - 400 K/uL Final   nRBC 09/14/2024 0.0  0.0 - 0.2 % Final   Performed at [facility], [address], [place], [place] [address]   Specimen Description  09/14/2024    Final                   Value:URINE, CLEAN CATCH Performed at [facility], [address], [place], [place] [address]    Special Requests 09/14/2024    Final                   Value:NONE Performed at [facility], [address] [place], [place] [address]    Culture 09/14/2024 >=100,000 COLONIES/mL PSEUDOMONAS AERUGINOSA (A)   Final   Report Status 09/14/2024 09/17/2024 FINAL   Final   Organism ID, Bacteria 09/14/2024 PSEUDOMONAS AERUGINOSA (A)   Final  Hospital Outpatient Visit on 07/11/2024  Component Date Value Ref Range Status   WBC 07/11/2024 4.5  4.0 - 10.5 K/uL Final   RBC 07/11/2024 4.76  4.22 - 5.81 MIL/uL Final   Hemoglobin 07/11/2024 14.4  13.0 - 17.0 g/dL Final   HCT 89/83/7974 40.6  39.0 - 52.0 % Final   MCV 07/11/2024 85.3  80.0 - 100.0 fL Final   MCH 07/11/2024 30.3  26.0 - 34.0 pg Final   MCHC 07/11/2024 35.5  30.0 - 36.0 g/dL Final   RDW 89/83/7974 12.7  11.5 - 15.5 % Final   Platelets 07/11/2024 201  150 - 400 K/uL Final   nRBC 07/11/2024 0.0  0.0 - 0.2 % Final   Performed at [facility], [address] [place], [place] [address]   Prothrombin Time 07/11/2024 12.9  11.4 - 15.2 seconds Final   INR 07/11/2024 0.9  0.8 - 1.2 Final   Comment: (NOTE) INR goal varies based on device and disease states. Performed at [facility], [address] [place], [place] [address]    Sodium 07/11/2024 134 (L)  135 - 145 mmol/L Final   Potassium 07/11/2024 4.1  3.5 - 5.1 mmol/L Final   Chloride 07/11/2024 99  98 - 111 mmol/L Final   CO2 07/11/2024 23  22 - 32 mmol/L Final   Glucose, Bld 07/11/2024 97  70 - 99 mg/dL Final   Glucose reference range applies only to samples taken after fasting for at least 8 hours.   BUN 07/11/2024 5 (L)  8 - 23 mg/dL Final   Creatinine, Ser 07/11/2024 0.70  0.61 - 1.24 mg/dL Final   Calcium  07/11/2024 9.7  8.9 - 10.3 mg/dL Final   GFR, Estimated 07/11/2024 >60  >60 mL/min Final    Comment: (NOTE) Calculated using the CKD-EPI Creatinine Equation (2021)    Anion gap 07/11/2024 12  5 - 15 Final   Performed at [facility], [address] [place], [place] [address]   Glucose-Capillary 07/11/2024 100 (H)  70 - 99 mg/dL Final   Glucose reference  range applies only to samples taken after fasting for at least 8 hours.  Office Visit on 05/29/2024  Component Date Value Ref Range Status   Color, UA 05/29/2024 yellow   Final   Clarity, UA 05/29/2024 cloudy   Final   Glucose, UA 05/29/2024 Negative  Negative Final   Bilirubin, UA 05/29/2024 neg   Final   Ketones, UA 05/29/2024 neg   Final   Spec Grav, UA 05/29/2024 1.010  1.010 - 1.025 Final   Blood, UA 05/29/2024 10 ery/uL   Final   pH, UA 05/29/2024 6.0  5.0 - 8.0 Final   Protein, UA 05/29/2024 Negative  Negative Final   Urobilinogen, UA 05/29/2024 0.2  0.2 or 1.0 E.U./dL Final   Nitrite, UA 90/96/7974 neg   Final   Leukocytes, UA 05/29/2024 Large (3+) (A)  Negative Final   WBC 05/29/2024 3.5 (L)  4.0 - 10.5 K/uL Final   RBC 05/29/2024 4.44  4.22 - 5.81 Mil/uL Final   Hemoglobin 05/29/2024 14.0  13.0 - 17.0 g/dL Final   HCT 90/96/7974 41.1  39.0 - 52.0 % Final   MCV 05/29/2024 92.5  78.0 - 100.0 fl Final   MCHC 05/29/2024 34.1  30.0 - 36.0 g/dL Final   RDW 90/96/7974 13.4  11.5 - 15.5 % Final   Platelets 05/29/2024 189.0  150.0 - 400.0 K/uL Final   Neutrophils Relative % 05/29/2024 65.3  43.0 - 77.0 % Final   Lymphocytes Relative 05/29/2024 20.9  12.0 - 46.0 % Final   Monocytes Relative 05/29/2024 10.9  3.0 - 12.0 % Final   Eosinophils Relative 05/29/2024 2.2  0.0 - 5.0 % Final   Basophils Relative 05/29/2024 0.7  0.0 - 3.0 % Final   Neutro Abs 05/29/2024 2.3  1.4 - 7.7 K/uL Final   Lymphs Abs 05/29/2024 0.7  0.7 - 4.0 K/uL Final   Monocytes Absolute 05/29/2024 0.4  0.1 - 1.0 K/uL Final   Eosinophils Absolute 05/29/2024 0.1  0.0 - 0.7 K/uL Final   Basophils Absolute 05/29/2024 0.0  0.0 - 0.1 K/uL Final    Sodium 05/29/2024 135  135 - 145 mEq/L Final   Potassium 05/29/2024 3.8  3.5 - 5.1 mEq/L Final   Chloride 05/29/2024 102  96 - 112 mEq/L Final   CO2 05/29/2024 27  19 - 32 mEq/L Final   Glucose, Bld 05/29/2024 85  70 - 99 mg/dL Final   BUN 90/96/7974 7  6 - 23 mg/dL Final   Creatinine, Ser 05/29/2024 0.66  0.40 - 1.50 mg/dL Final   GFR 90/96/7974 93.88  >60.00 mL/min Final   Calculated using the CKD-EPI Creatinine Equation (2021)   Calcium  05/29/2024 9.3  8.4 - 10.5 mg/dL Final   MICRO NUMBER: 90/96/7974 [id number]   Final   SPECIMEN QUALITY: 05/29/2024 Adequate   Final   Sample Source 05/29/2024 URINE   Final   STATUS: 05/29/2024 FINAL   Final   ISOLATE 1: 05/29/2024 Enterococcus faecalis (A)   Final   Greater than 100,000 CFU/mL of Enterococcus faecalis  Office Visit on 04/30/2024  Component Date Value Ref Range Status   WBC 04/30/2024 4.8  4.0 - 10.5 K/uL Final   RBC 04/30/2024 4.37  4.22 - 5.81 Mil/uL Final   Hemoglobin 04/30/2024 13.8  13.0 - 17.0 g/dL Final   HCT 91/94/7974 40.3  39.0 - 52.0 % Final   MCV 04/30/2024 92.3  78.0 - 100.0 fl Final   MCHC 04/30/2024 34.3  30.0 - 36.0 g/dL Final   RDW 91/94/7974  13.5  11.5 - 15.5 % Final   Platelets 04/30/2024 273.0  150.0 - 400.0 K/uL Final   Neutrophils Relative % 04/30/2024 69.0  43.0 - 77.0 % Final   Lymphocytes Relative 04/30/2024 19.8  12.0 - 46.0 % Final   Monocytes Relative 04/30/2024 7.8  3.0 - 12.0 % Final   Eosinophils Relative 04/30/2024 2.2  0.0 - 5.0 % Final   Basophils Relative 04/30/2024 1.2  0.0 - 3.0 % Final   Neutro Abs 04/30/2024 3.3  1.4 - 7.7 K/uL Final   Lymphs Abs 04/30/2024 0.9  0.7 - 4.0 K/uL Final   Monocytes Absolute 04/30/2024 0.4  0.1 - 1.0 K/uL Final   Eosinophils Absolute 04/30/2024 0.1  0.0 - 0.7 K/uL Final   Basophils Absolute 04/30/2024 0.1  0.0 - 0.1 K/uL Final   Magnesium  04/30/2024 2.0  1.5 - 2.5 mg/dL Final   Sodium 91/94/7974 135  135 - 145 mEq/L Final   Potassium 04/30/2024 4.2  3.5 - 5.1  mEq/L Final   Chloride 04/30/2024 98  96 - 112 mEq/L Final   CO2 04/30/2024 29  19 - 32 mEq/L Final   Glucose, Bld 04/30/2024 87  70 - 99 mg/dL Final   BUN 91/94/7974 6  6 - 23 mg/dL Final   Creatinine, Ser 04/30/2024 0.65  0.40 - 1.50 mg/dL Final   Total Bilirubin 04/30/2024 0.6  0.2 - 1.2 mg/dL Final   Alkaline Phosphatase 04/30/2024 71  39 - 117 U/L Final   AST 04/30/2024 12  0 - 37 U/L Final   ALT 04/30/2024 21  0 - 53 U/L Final   Total Protein 04/30/2024 6.7  6.0 - 8.3 g/dL Final   Albumin 91/94/7974 4.4  3.5 - 5.2 g/dL Final   GFR 91/94/7974 94.37  >60.00 mL/min Final   Calculated using the CKD-EPI Creatinine Equation (2021)   Calcium  04/30/2024 9.9  8.4 - 10.5 mg/dL Final   Cholesterol 91/94/7974 160  0 - 200 mg/dL Final   ATP III Classification       Desirable:  < 200 mg/dL               Borderline High:  200 - 239 mg/dL          High:  > = 759 mg/dL   Triglycerides 91/94/7974 118.0  0.0 - 149.0 mg/dL Final   Normal:  <849 mg/dLBorderline High:  150 - 199 mg/dL   HDL 91/94/7974 43.99  >39.00 mg/dL Final   VLDL 91/94/7974 23.6  0.0 - 40.0 mg/dL Final   LDL Cholesterol 04/30/2024 81  0 - 99 mg/dL Final   Total CHOL/HDL Ratio 04/30/2024 3   Final                  Men          Women1/2 Average Risk     3.4          3.3Average Risk          5.0          4.42X Average Risk          9.6          7.13X Average Risk          15.0          11.0                       NonHDL 04/30/2024 104.47   Final   NOTE:  Non-HDL goal  should be 30 mg/dL higher than patient's LDL goal (i.e. LDL goal of < 70 mg/dL, would have non-HDL goal of < 100 mg/dL)   PSA 04/30/2024 11.79 (H)  0.10 - 4.00 ng/mL Final   Test performed using [employer] PSA Assay, a parmagnetic partical, chemiluminecent immunoassay.   TSH 04/30/2024 0.83  0.35 - 5.50 uIU/mL Final   Vitamin B-12 04/30/2024 653  211 - 911 pg/mL Final   Vitamin B1 (Thiamine ) 04/30/2024 28  8 - 30 nmol/L Final   Comment: (Note) Vitamin  supplementation within 24 hours prior to blood  draw may affect the accuracy of the results. . This test was developed and its analytical performance  characteristics have been determined by [employer]. It has not been cleared or approved by FDA.  This assay has been validated pursuant to the CLIA  regulations and is used for clinical purposes. . [facility] [address] [place] [address] [phone] Johanna Agent L. Frame, MD, PhD    VITD 04/30/2024 70.02  30.00 - 100.00 ng/mL Final   Valproic Acid  Lvl 04/30/2024 48.8 (L)  50.0 - 100.0 mg/L Final   QUESTION/PROBLEM: 04/30/2024    Final   Comment: . Please clarify the following specimen source/type submitted. .    QUESTION: 04/30/2024 VERIFY SOURCE OF OT   Final   Comment: REQUESTED INFORMATION _________________________________ . AUTHORIZED SIGNATURE __________________________________ . TO PREVENT FURTHER DELAYS IN TESTING, PLEASE COMPLETE INFORMATION ABOVE AND EITHER FAX TO [phone] OR  EMAIL TO [email or web address] TO  RESOLVE THIS ORDER.   There may be more visits with results that are not included.    Blood Alcohol level:  Lab Results  Component Value Date   [facility] <15 10/13/2024   ETH <5 04/11/2017    Metabolic Disorder Labs: Lab Results  Component Value Date   HGBA1C 5.5 10/13/2024   MPG 111.15 10/13/2024   No results found for: PROLACTIN Lab Results  Component Value Date   CHOL 156 10/13/2024   TRIG 63 10/13/2024   HDL 63 10/13/2024   CHOLHDL 2.5 10/13/2024   VLDL 13 10/13/2024   LDLCALC 80 10/13/2024   LDLCALC 81 04/30/2024    Therapeutic Lab Levels: No results found for: LITHIUM Lab Results  Component Value Date   VALPROATE 49 (L) 10/13/2024   VALPROATE 48.8 (L) 04/30/2024   No results found for: CBMZ  Physical Findings   AIMS    Flowsheet Row Admission (Discharged) [facility]  AIMS Total Score 0   AUDIT    Flowsheet Row Clinical Support from 05/09/2023 [facility] Clinical Support from 05/05/2022 in [facility] Admission (Discharged) from OP [facility]  Alcohol Use Disorder Identification Test Final Score (AUDIT) 9 4 5    GAD-7    Flowsheet Row Office Visit from 10/09/2024 in [facility] [place] Office Visit from 10/02/2024 in [facility] Office Visit from 09/30/2024 in [facility] Office Visit from 08/27/2024 in [facility] Visit from 05/17/2024 in [facility]  Total GAD-7 Score 7 7 7 7 21    Mini-Mental    Flowsheet Row Clinical Support from 03/18/2019 [facility] Clinical Support from 03/14/2018 in  [facility] Clinical Support from 03/08/2017 in [name]  Total Score (max 30 points ) 17 20 18    PHQ2-9    Flowsheet Row Office Visit from 10/09/2024 [facility] Visit from 10/02/2024 in [facility] Office Visit from 09/30/2024 in [facility] Office Visit from 08/27/2024 in [facility] Office Visit from 05/29/2024 in [facility] at [place]  PHQ-2 Total Score 1 1 1 1 3   PHQ-9 Total Score 7 2 3 6 21    Flowsheet Row ED from 10/14/2024 in [facility] Most recent reading at 10/14/2024  5:25 AM ED from 10/14/2024 in [facility] Most recent reading at 10/14/2024  1:57 AM ED from 10/13/2024 in [facility] Most recent reading at 10/13/2024  1:52 PM  C-SSRS RISK CATEGORY No Risk No Risk No Risk     Musculoskeletal  Strength & Muscle Tone: within normal limits Gait & Station: normal Patient leans: N/A  Psychiatric Specialty Exam  Presentation  General Appearance:  Appropriate for Environment  Eye Contact: Fair  Speech: Clear and Coherent  Speech Volume: Decreased  Handedness: Right   Mood and Affect  Mood: Anxious  Affect: Congruent   Thought Process  Thought Processes: Disorganized  Descriptions of Associations:Circumstantial  Orientation:Full (Time, Place and Person)  Thought Content:Illogical  Diagnosis of Schizophrenia or Schizoaffective disorder in past: Yes  Duration of Psychotic Symptoms: N/A   Hallucinations:Hallucinations: None  Ideas of Reference:None  Suicidal Thoughts:Suicidal Thoughts: No  Homicidal Thoughts:Homicidal Thoughts: No   Sensorium  Memory: Immediate Fair  Judgment: Poor  Insight: Poor   Executive Functions  Concentration: Poor  Attention Span: Fair  Recall: [employer] of Knowledge: Fair  Language: Fair   Psychomotor Activity  Psychomotor Activity: Psychomotor Activity: Normal   Assets  Assets: Desire for Improvement   Sleep  Sleep: Sleep: Poor  No Safety Checks orders active in given range  No data recorded  Physical Exam  Physical Exam Vitals and nursing note reviewed.  Constitutional:      Appearance: Normal appearance.  Eyes:     Pupils: Pupils are equal, round, and reactive to light.  Musculoskeletal:     Cervical back: Normal range of motion.  Neurological:     General: No focal deficit present.     Mental Status: He is alert and oriented to person, place, and time.    Review of Systems  Psychiatric/Behavioral:  Positive for depression. Negative for hallucinations, memory loss, substance abuse and suicidal ideas. The patient is nervous/anxious and has insomnia.    All other systems reviewed and are negative.  Blood pressure (!) 141/86, pulse 70, temperature 98.1 F (36.7 C), temperature source Oral, resp. rate 18, SpO2 99%. There is no height or weight on file to calculate BMI.  Treatment Plan Summary: Daily contact with patient to assess and evaluate symptoms and progress in treatment and Medication management  Meds  amLODipine   2.5 mg Oral Daily   aspirin   81 mg Oral Daily   B-complex with vitamin C  1 tablet Oral QODAY   calcium  carbonate  1 tablet Oral Q breakfast   cholecalciferol   1,000 Units Oral q AM   divalproex   1,000 mg Oral QHS   melatonin  5 mg Oral QHS  neomycin -bacitracin -polymyxin  1 Application Topical Once   OLANZapine   10 mg Oral QHS   tamsulosin   0.4 mg Oral Daily    -increased depakote  to aid with ongoing manic symptoms  -hepatic function and cbc wnl -uncertain if symptoms are cannabis induced  PRNS -Continue Tylenol  650 mg every 6 hours PRN for mild pain -Continue Maalox 30 mg every 4 hrs PRN for indigestion -Continue Milk of Magnesia as needed every 6 hrs for constipation -Continue Abuterol inhaler every 6 hours PRN for wheezing/SOB  Labs: Reviewed: repeat bmp, ordered ammonia to r/o hyperammonemia  Discharge Planning: Social work and case management to assist with discharge planning and identification of hospital follow-up needs prior to discharge Estimated LOS: 5-7 days Discharge Concerns: Need to establish a safety plan; Medication compliance and effectiveness Discharge Goals: Return home with outpatient referrals for mental health follow-up including medication management/psychotherapy   Prentice Espy, MD 10/15/2024 1:32 PM

## 2024-10-15 NOTE — Progress Notes (Signed)
 LCSW Progress Note   [id number]    Robert Fowler   10/15/2024   5:27 PM   Description:    Inpatient Psychiatric Referral   Patient was recommended inpatient per Donia Snell (NP). There are [facility] beds at [facility], per [facility] [name]). Patient was referred to the following out of network facilities:   Destination  Service Provider Request Status Address Phone Fax  [facility]  Pending - Request Sent [address] [place] [address] [phone]  [facility]  Pending - Request Sent [address] [place] [address] [phone]  [facility]  Pending - Request Sent [address] [place] [address] [phone]  [facility]  Pending - Request Sent [address] [place] [address] [phone]  [facility]  Pending - Request Sent [address], [place] [address] [phone]  [facility]  Pending - Request Sent [address] [place] [address] [phone]  [facility]  Pending - Request Sent [address] [place] [phone]  [facility]  Pending - Request Sent [address], [place] [address] [phone]  [facility]  Pending - Request [facility] [address] [employer] [place] [address] [phone]  [facility]  Pending - Request Sent [address] [place] [address] [phone]  [facility]  Pending - Request Sent [address] [name] [address] [phone]    Situation ongoing, CSW to continue following and update chart as more information becomes available.   Niel Nightingale, MSW, LCSW

## 2024-10-15 NOTE — ED Notes (Signed)
 Update Note  Patient was re-assessed this PM due to patient requesting discharge. Patient remains pressured in speech and tangential. He insisted about not suing the hospital and not telling anyone about his experience besides his family. We discussed his concern and he was amenable I reach out to Robert Fowler (sister)  Sister [name] @ [phone]) Sister last spoke with patient this AM. He continues to be not at psychiatric baseline. She is worried for his safety because of his racing thoughts and impulsivity. Recently deceased mom was a big social support for him especially when it came to ensuring his mental health was stable. Sister reports concern patient had missed 3-4 days of his psychotropics which may be contributing to his current presentation. Sister would like updates regarding transfer and ongoing care when possible.   Assessment Patient remains somewhat manic and appears anxious. Notably, patient is on olanzapine  15 mg and was only restarted on 5 mg bid so increased this back to home dose. Hydroxyzine  was restarted as this was previously effective for patient. Hydroxyzine  is not known for its anticholinergic effects as opposed to its antihistamine effects and given patient has been on this for some time as an anxiolytic, will restart and monitor. Holding benztropine  seems reasonable as there was concern for delirium although he was axox4 during both evaluations today.  -restart hydroxyzine  25 mg tid prn for anxiety -continue to recommend geropsych for psychiatric stabilization

## 2024-10-15 NOTE — ED Notes (Signed)
Pt awake  & resting at present, no distress noted. Calm & cooperative.  Monitoring for safety. 

## 2024-10-15 NOTE — Progress Notes (Signed)
 LCSW Progress Note  [id number]   Robert Fowler  10/15/2024  10:17 AM  Description:   Inpatient Psychiatric Referral  Patient was recommended inpatient per Robert Fowler (NP). There are [facility] beds [facility], per [facility] Robert Fowler). Patient was referred to the following out of network facilities:   [facility] Provider Address Phone Fax  [facility]  [address] [place] [address] [phone]  [facility]  [address] [place] [address] [phone]  [facility]  [address] [place], [place] [address] [phone]  [facility]  [address] [place] [address] [phone]  [facility]  [address] [place] [address] [phone]  [facility]  [address], [place] [address] [phone]  [facility] EFAX  [address] [place], [place] [phone]  [facility]  [address] [place] [address] [phone]  [facility]  [address] [place] [address] [phone]  [facility]  [address] [place] [address] [phone]  [facility]  [address] [name] [address] [phone]      Situation ongoing, CSW to continue following and update chart as more information becomes available.    Robert Fowler, MSW, LCSW  10/15/2024 10:17 AM

## 2024-10-15 NOTE — ED Notes (Signed)
 Pt hyper verbal, hyper religious, disorganized. He denies SI/HI/AVH. Denies c/o pain. No noted distress. Environmental check complete

## 2024-10-15 NOTE — ED Notes (Signed)
 Patient provided with band aid to cover up sutures for shower. Patient remains cooperative in unit. No s/s of current distress.

## 2024-10-15 NOTE — ED Notes (Signed)
 Patient awake and provided with breakfast. Patient presents tangential this morning but pleasant and med compliant. Patient denies SI,HI, and A/V/H with no plan or intent. Patient denies any pain but verbalizes concern about having an appointment tomorrow to remove his sutures on his L knee. Patient reassured that provider will be speaking with him this morning. Patient verbalizes no further concerns at this time.

## 2024-10-15 NOTE — ED Notes (Addendum)
 Patient given hydroxyzine  po prn for anxiety regarding waiting for placement. Patient spoke with provider and provided with a book and is currently watching tv. Patient appears calmer and remains cooperative.

## 2024-10-16 ENCOUNTER — Emergency Department (HOSPITAL_COMMUNITY)
Admission: EM | Admit: 2024-10-16 | Discharge: 2024-10-17 | Disposition: A | Discharge status: Home / Self Care | Attending: Emergency Medicine | Admitting: Emergency Medicine

## 2024-10-16 DIAGNOSIS — E871 Hypo-osmolality and hyponatremia: Secondary | ICD-10-CM

## 2024-10-16 DIAGNOSIS — R338 Other retention of urine: Secondary | ICD-10-CM

## 2024-10-16 DIAGNOSIS — Z7982 Long term (current) use of aspirin: Secondary | ICD-10-CM | POA: Diagnosis not present

## 2024-10-16 DIAGNOSIS — F250 Schizoaffective disorder, bipolar type: Secondary | ICD-10-CM | POA: Diagnosis not present

## 2024-10-16 DIAGNOSIS — I10 Essential (primary) hypertension: Secondary | ICD-10-CM

## 2024-10-16 DIAGNOSIS — T83511A Infection and inflammatory reaction due to indwelling urethral catheter, initial encounter: Secondary | ICD-10-CM | POA: Diagnosis present

## 2024-10-16 DIAGNOSIS — N401 Enlarged prostate with lower urinary tract symptoms: Secondary | ICD-10-CM | POA: Diagnosis not present

## 2024-10-16 DIAGNOSIS — F259 Schizoaffective disorder, unspecified: Secondary | ICD-10-CM

## 2024-10-16 DIAGNOSIS — Y732 Prosthetic and other implants, materials and accessory gastroenterology and urology devices associated with adverse incidents: Secondary | ICD-10-CM | POA: Diagnosis not present

## 2024-10-16 LAB — COMPREHENSIVE METABOLIC PANEL WITH GFR
ALT: 13 U/L (ref 0–44)
AST: 15 U/L (ref 15–41)
Albumin: 4.4 g/dL (ref 3.5–5.0)
Alkaline Phosphatase: 86 U/L (ref 38–126)
Anion gap: 10 (ref 5–15)
BUN: 8 mg/dL (ref 8–23)
CO2: 25 mmol/L (ref 22–32)
Calcium: 9.8 mg/dL (ref 8.9–10.3)
Chloride: 100 mmol/L (ref 98–111)
Creatinine, Ser: 0.7 mg/dL (ref 0.61–1.24)
GFR, Estimated: >60 mL/min
Glucose, Bld: 96 mg/dL (ref 70–99)
Potassium: 4.3 mmol/L (ref 3.5–5.1)
Sodium: 135 mmol/L (ref 135–145)
Total Bilirubin: 0.7 mg/dL (ref 0.0–1.2)
Total Protein: 7.5 g/dL (ref 6.5–8.1)

## 2024-10-16 LAB — CBC WITH DIFFERENTIAL/PLATELET
Abs Immature Granulocytes: 0.02 K/uL (ref 0.00–0.07)
Basophils Absolute: 0.1 K/uL (ref 0.0–0.1)
Basophils Relative: 1 %
Eosinophils Absolute: 0.2 K/uL (ref 0.0–0.5)
Eosinophils Relative: 2 %
HCT: 40 % (ref 39.0–52.0)
Hemoglobin: 13.7 g/dL (ref 13.0–17.0)
Immature Granulocytes: 0 %
Lymphocytes Relative: 25 %
Lymphs Abs: 1.6 K/uL (ref 0.7–4.0)
MCH: 29.1 pg (ref 26.0–34.0)
MCHC: 34.3 g/dL (ref 30.0–36.0)
MCV: 84.9 fL (ref 80.0–100.0)
Monocytes Absolute: 0.7 K/uL (ref 0.1–1.0)
Monocytes Relative: 11 %
Neutro Abs: 4 K/uL (ref 1.7–7.7)
Neutrophils Relative %: 61 %
Platelets: 232 K/uL (ref 150–400)
RBC: 4.71 MIL/uL (ref 4.22–5.81)
RDW: 14.6 % (ref 11.5–15.5)
WBC: 6.6 K/uL (ref 4.0–10.5)
nRBC: 0 % (ref 0.0–0.2)

## 2024-10-16 LAB — URINALYSIS, ROUTINE W REFLEX MICROSCOPIC
Bilirubin Urine: NEGATIVE
Glucose, UA: NEGATIVE mg/dL
Ketones, ur: 5 mg/dL — AB
Nitrite: POSITIVE — AB
Protein, ur: NEGATIVE mg/dL
Specific Gravity, Urine: 1.013 (ref 1.005–1.030)
pH: 7 (ref 5.0–8.0)

## 2024-10-16 MED ORDER — ASPIRIN 81 MG PO TBEC
81.0000 mg | DELAYED_RELEASE_TABLET | Freq: Every day | ORAL | Status: DC
  Administered 2024-10-17: 81 mg via ORAL
  Filled 2024-10-16: qty 1

## 2024-10-16 MED ORDER — CALCIUM CITRATE 950 (200 CA) MG PO TABS
950.0000 mg | ORAL_TABLET | Freq: Every day | ORAL | Status: DC
  Administered 2024-10-17: 950 mg via ORAL
  Filled 2024-10-16: qty 1

## 2024-10-16 MED ORDER — AMLODIPINE BESYLATE 5 MG PO TABS
2.5000 mg | ORAL_TABLET | Freq: Every day | ORAL | Status: DC
  Administered 2024-10-17: 2.5 mg via ORAL
  Filled 2024-10-16: qty 1

## 2024-10-16 MED ORDER — OLANZAPINE 5 MG PO TBDP
5.0000 mg | ORAL_TABLET | Freq: Every day | ORAL | Status: DC
  Administered 2024-10-17: 5 mg via ORAL
  Filled 2024-10-16: qty 1

## 2024-10-16 MED ORDER — TAMSULOSIN HCL 0.4 MG PO CAPS
0.4000 mg | ORAL_CAPSULE | Freq: Every day | ORAL | Status: DC
  Administered 2024-10-17: 0.4 mg via ORAL
  Filled 2024-10-16: qty 1

## 2024-10-16 MED ORDER — OLANZAPINE 10 MG PO TBDP
10.0000 mg | ORAL_TABLET | Freq: Every day | ORAL | Status: DC
  Administered 2024-10-16: 10 mg via ORAL
  Filled 2024-10-16: qty 1

## 2024-10-16 MED ORDER — B COMPLEX-C PO TABS
1.0000 | ORAL_TABLET | ORAL | Status: DC
  Administered 2024-10-17: 1 via ORAL
  Filled 2024-10-16: qty 1

## 2024-10-16 MED ORDER — OLANZAPINE 5 MG PO TABS: 5.0000 mg | ORAL_TABLET | Freq: Every day | ORAL | Status: DC

## 2024-10-16 MED ORDER — HYDROXYZINE HCL 25 MG PO TABS: 25.0000 mg | ORAL_TABLET | Freq: Four times a day (QID) | ORAL | Status: DC | PRN

## 2024-10-16 MED ORDER — BENZTROPINE MESYLATE 0.5 MG PO TABS
0.5000 mg | ORAL_TABLET | Freq: Every day | ORAL | Status: DC
  Administered 2024-10-16: 0.5 mg via ORAL
  Filled 2024-10-16: qty 1

## 2024-10-16 MED ORDER — DIVALPROEX SODIUM ER 500 MG PO TB24
750.0000 mg | ORAL_TABLET | Freq: Every day | ORAL | Status: DC
  Administered 2024-10-16: 750 mg via ORAL
  Filled 2024-10-16: qty 1

## 2024-10-16 NOTE — ED Notes (Addendum)
 Pt quickly stood up, walked to the door, and shook it. Pt raised his voice and says, You can't keep me here. Pt continues to pace around and went to shake the door once more. MHT and Rns monitoring the situation.

## 2024-10-16 NOTE — ED Notes (Signed)
 Pt awake, chatty, disorganized. Continues to be hyper verbal. No noted distress. Environmental check complete.

## 2024-10-16 NOTE — ED Triage Notes (Signed)
 Patient BIB EMS from [facility] for possible UTI per EMS.  Patient has foley cath and has swelling and irritation at head of penis/insertion site. Cath draining urine and no symptoms of UTI. Patient reports to EMS that it is probably from his excessive masturbation. Patient also has sutures in his left knee that needs to come out per EMS. Patient denies pain.

## 2024-10-16 NOTE — Progress Notes (Signed)
 Inpatient Psychiatric Referral  Patient was recommended inpatient per  [name], MD. There are no available beds at [facility], per [facility]. Patient was referred to the following out of network facilities:  [facility] Provider Address Phone Fax  [facility]  [address] [place] [address] [phone]  [facility]  [address] [place], [place] [address] [phone]  [facility]  [address], [place] [address] [phone]  [facility]  [address], [place] [address] [phone]  [facility]  [address], [place] [address] [phone]  [facility] EFAX  [address] [place], [place] [phone]  [facility]  [address] [name] [address] [phone]  [facility]  [address] [place] [address] [phone]  [facility]  [address] [place], [place] [address] [phone]    Situation ongoing, CSW to continue following and update chart as more information becomes available.   Harrie Sofia MSW, [place] 10/16/2024

## 2024-10-16 NOTE — ED Provider Notes (Signed)
 " [place] [facility]ERGENCY DE[facility]MENT AT [facility] Provider Note   CSN: [id number] Arrival date & time: 10/16/24  1601     Patient presents with: No chief complaint on file.   Robert Fowler is a 74 y.o. male.   Patient seen here from behavioral health.  Patient has a Foley catheter and there was concern that he could have infection around his Foley catheter.  I spoke to Dr.Gottfried who reports that they cannot care for patient at the [facility] facility.  Patient apparently is experiencing sundowning.  They report that patient is in a continuously lit environment and has not been able to sleep.  Patient is being held for placement in a [facility] psych unit.  Patient needs long term facility.  Patient currently denies any complaints he is not having any pain.  Patient states that he wants to go home.         Prior to Admission medications  Medication Sig Start Date End Date Taking? Authorizing Provider  acetaminophen  (TYLENOL ) 500 MG tablet Take 500 mg by mouth at bedtime.    [provider]  amLODipine  (NORVASC ) 2.5 MG tablet Take 1 tablet (2.5 mg total) by mouth daily. 05/17/24   Rilla Baller, MD  amoxicillin -clavulanate (AUGMENTIN ) 875-125 MG tablet Take 1 tablet by mouth 2 (two) times daily. Patient taking differently: Take 1 tablet by mouth 2 (two) times daily. Take for 10 days starting on 10/03/24 10/03/24   Sherida Adine BROCKS, MD  aspirin  EC 81 MG tablet Take 81 mg by mouth daily. Swallow whole.    [provider]  B Complex Vitamins (VITAMIN B COMPLEX  PO) Take 1 capsule by mouth every other day.    [provider]  benztropine  (COGENTIN ) 0.5 MG tablet Take 0.5 mg by mouth at bedtime.    [provider]  Calcium  Citrate 333 MG TABS Take 333 mg by mouth daily.    [provider]  cephALEXin  (KEFLEX ) 500 MG capsule Take 500 mg by mouth 4 (four) times daily. Take for 7 days starting on 09/27/24    [provider]  Cholecalciferol   (VITAMIN D3 PO) Take 1 capsule by mouth in the morning.    [provider]  divalproex  (DEPAKOTE  ER) 250 MG 24 hr tablet Take 750 mg by mouth at bedtime. 02/23/18   [provider]  FLUoxetine  (PROZAC ) 20 MG capsule Take 20 mg by mouth in the morning. 02/23/18   [provider]  hydrOXYzine  (ATARAX /VISTARIL ) 25 MG tablet Take 25 mg by mouth in the morning and at bedtime. 05/07/21   Rilla Baller, MD  OLANZapine  zydis (ZYPREXA ) 10 MG disintegrating tablet Take 10 mg by mouth at bedtime.    [provider]  OLANZapine  zydis (ZYPREXA ) 5 MG disintegrating tablet Take 5 mg by mouth daily.    [provider]  tadalafil  (CIALIS ) 5 MG tablet Take 1 tablet (5 mg total) by mouth daily. Patient taking differently: Take 5 mg by mouth at bedtime as needed for erectile dysfunction. 04/22/24   Pokhrel, Vernal, MD  tamsulosin  (FLOMAX ) 0.4 MG CAPS capsule Take 1 capsule (0.4 mg total) by mouth daily. 05/17/24   Rilla Baller, MD    Allergies: Poison oak extract    Review of Systems  All other systems reviewed and are negative.   Updated Vital Signs There were no vitals taken for this visit.  Physical Exam Vitals and nursing note reviewed.  Constitutional:      Appearance: He is well-developed.  HENT:  Head: Normocephalic.  Cardiovascular:     Rate and Rhythm: Normal rate.  Pulmonary:     Effort: Pulmonary effort is normal.  Abdominal:     General: There is no distension.  Musculoskeletal:        General: Normal range of motion.     Cervical back: Normal range of motion.  Skin:    General: Skin is warm.  Neurological:     General: No focal deficit present.     Mental Status: He is alert and oriented to person, place, and time.     (all labs ordered are listed, but only abnormal results are displayed) Labs Reviewed  CBC WITH DIFFERENTIAL/PLATELET  COMPREHENSIVE METABOLIC PANEL WITH GFR  URINALYSIS, ROUTINE W REFLEX MICROSCOPIC     EKG: None  Radiology: No results found.   Procedures   Medications Ordered in the ED - No data to display                                  Medical Decision Making Pt  sent here to hold for bed placement at a geriatric psych  facility.    Amount and/or Complexity of Data Reviewed Independent Historian:     Details: History provided by Physician at [facility] Labs: ordered. Decision-making details documented in ED Course.    Details: Labs ordered reviewed and interpreted         Final diagnoses:  Schizoaffective disorder, unspecified type [facility])    ED Discharge Orders     None          Fl[place] Sonny POUR, PA-C 10/16/24 1849    Elnor Jayson LABOR, DO 10/19/24 1545  "

## 2024-10-16 NOTE — Progress Notes (Signed)
 LCSW Progress Note  [id number]   Robert Fowler  10/16/2024  9:57 AM  Description:   Inpatient Psychiatric Referral  Patient was recommended inpatient per [name] (NP). There are [name], per [name] [name] ). Patient was referred to the following out of network facilities:     [name] Provider Address Phone Fax  [name]  [address], [place] [address] [phone]  [name]  [address] [place] [address] [phone]  [name]  [address] [place], [place] [address] [phone]  [name]  [address] [place] [address] [phone]  [name]  [address], [place] [address] [phone]  [name]  [address] [place] [address] [phone]  [name] EFAX  [address] [place], [place] [phone]  [name]  [address] [place] [address] [phone]  [name]  [address], [place] [address] [phone]  [name]  [address] [place] [address] [phone]  [name]  [address] [name] [address] [phone]     Situation ongoing, CSW to continue following and update chart as more information becomes available.      [name], MSW, LCSW  10/16/2024 9:57 AM

## 2024-10-16 NOTE — ED Notes (Signed)
 Foley catheter care done with soap and water. Pt instructed on proper technique for catheter care. Reminded not to touch urinary meatus and catheter insertion site. Meatus is slightly reddened. Pt reports that he has had a catheter at home and he knows how to care for it. Pt continues to worry about getting the sutures removed from his knee. Dr. Lynnette notified of the redness around his catheter and that he has been increasingly restless and elevated reto discharge placement.

## 2024-10-16 NOTE — ED Notes (Signed)
 Pt awake, but quiet. No noted distress.

## 2024-10-16 NOTE — Group Note (Deleted)
 Group Topic: Positive Affirmations  Group Date: 10/15/2024 Start Time: 2000 End Time: 2030 Facilitators: Joshua Ellouise CROME  Department: [facility]  Number of Participants: 5  Group Focus: check in Treatment Modality:  Leisure Development Interventions utilized were group exercise Purpose: reinforce self-care   Name: Robert Fowler Date of Birth: 10/19/1950  MR: [id number]    Level of Participation: {THERAPIES; PSYCH GROUP PARTICIPATION OZCZO:76008} Quality of Participation: {THERAPIES; PSYCH QUALITY OF PARTICIPATION:23992} Interactions with others: {THERAPIES; PSYCH INTERACTIONS:23993} Mood/Affect: {THERAPIES; PSYCH MOOD/AFFECT:23994} Triggers (if applicable): *** Cognition: {THERAPIES; PSYCH COGNITION:23995} Progress: {THERAPIES; PSYCH PROGRESS:23997} Response: *** Plan: {THERAPIES; PSYCH EOJW:76003}  Patients Problems:  Patient Active Problem List   Diagnosis Date Noted   Cutaneous abscess of left knee 09/30/2024   Hyponatremia 04/20/2024   Acute UTI (urinary tract infection) 04/20/2024   Acute urinary retention 04/20/2024   Abnormal CBC 05/16/2023   Vitamin B1 deficiency 05/16/2023   Vitamin B12 deficiency 05/16/2023   HLD (hyperlipidemia) 05/06/2023   Elevated PSA 05/10/2022   Essential hypertension 04/17/2019   Schizoaffective disorder (HCC)    Medicare annual wellness visit, subsequent 03/14/2018   Nocturnal dyspnea 06/13/2017   Bipolar 1 disorder (HCC) 04/11/2017   Advanced care planning/counseling discussion 03/03/2015   Habitual alcohol use    Health maintenance examination 01/27/2014   Pes planus 01/11/2008   Osteopenia 09/04/2007   Ex-smoker 09/04/2007   Hematuria 07/18/2007   Benign prostatic hyperplasia 07/18/2007   Pain and swelling of left lower leg 07/18/2007

## 2024-10-16 NOTE — ED Provider Notes (Addendum)
 Behavioral Health Progress Note  Date and Time: 10/16/2024 9:42 AM Name: Robert Fowler MRN:  [id number]  HPI: Per triage: Robert Fowler is a 79Y male presenting to [facility] as a vol walk-in with his family. Pt states he is here today because he feels like he is taking too many medications. Pt states he is also afriad he is getting dementia because his mother died of it and he has started to stutter his words a lot. Pt states he has been feeling very hyper and has not been getting much sleep. Pt states he has an a hx of past inpatient stays going back to 1968. Pt states he just needs something that will calm him down. Pt has a hx of schizophrenia and bipolar. Pt is seeing a therapist and taking medicine, Pt denies SI, HI, and substance use. [name], [name] RAMAN, NT,Date of Service: 10/13/2024 11:35 AM).  Patient was assessed by provider, determined to have manic type symptoms, and to meet criteria for an inpatient hospitalization, and those recommendations were made. Overnight, pt was sent to the ER due to urinary retention, and was returned to the [facility] with an indwelling urinary catheter. Notable labs include mild hyponatremia (133) and UDS+marijuana  Patient assessment 10/16/24 Patient seen and evaluated at bedside. Denies SI/HI/AH but is tangential throughout assessment and at times difficult to redirect. He endorses AH stating he hears sister's name increase He does endorse racing thoughts and at times appears restless. He endorses increasing paranoid towards staff and providers that he will be sent to a forest and left to die. He has not been able to have any visitation which he cites increases his suspicion.  He report feeling anxious at this time but remains amenable for psychiatric admission.   Diagnosis:  Final diagnoses:  Schizoaffective disorder, bipolar type (HCC)  Hyponatremia  Acute urinary retention  Benign prostatic hyperplasia with urinary retention  Essential hypertension     Total Time spent with patient: 45 minutes  Past Psychiatric History: Schizoaffective d/o Past Medical History:  Past Medical History:  Diagnosis Date   Arthritis    BENIGN PROSTATIC HYPERTROPHY 07/18/2007   Bipolar disorder (HCC)    Habitual alcohol use    Lesion of oral mucosa 03/21/2018   Biopsy 03/2018 [place]) - INFLAMMATORY FIBROUS HYPERPLASIA CONSISTENT WITH DENTURE INJURY (EPULIS FISSURATUM).    OSTEOPOROSIS 09/04/2007   DEXA -2.8 lumbar (03/2015)   Pes planus    Prediabetes 2014   Schizoaffective disorder (HCC)    h/o paranoid schizophrenia   Wears dentures    full upper and lower    Family History:  Family History  Problem Relation Age of Onset   COPD Mother        smoker   Dementia Mother    Alcohol abuse Father    Alcohol abuse Maternal Grandmother    CAD Neg Hx    Stroke Neg Hx    Cancer Neg Hx    Diabetes Neg Hx    Hypertension Neg Hx     Family Psychiatric  History: not provided Social History:  Social History   Socioeconomic History   Marital status: Single    Spouse name: Not on file   Number of children: Not on file   Years of education: Not on file   Highest education level: Not on file  Occupational History   Not on file  Tobacco Use   Smoking status: Former    Current packs/day: 0.00    Types: Cigarettes    Quit  date: 09/26/2006    Years since quitting: 18.0   Smokeless tobacco: Never  Vaping Use   Vaping status: Never Used  Substance and Sexual Activity   Alcohol use: Not Currently    Comment: quit   Drug use: Yes    Types: Marijuana    Comment: last done 1/14 bc anxiety   Sexual activity: Not Currently  Other Topics Concern   Not on file  Social History Narrative   Lives alone.     Mother and brother live nearby.   Occupation: copy at [employer]   Edu: 12th grade   Activity: mows yard   Diet: good water, fruits/vegetables daily      Disability evaluation - no disability (04/2014)   Social Drivers of Health    Tobacco Use: Medium Risk (10/14/2024)   Patient History    Smoking Tobacco Use: Former    Smokeless Tobacco Use: Never    Passive Exposure: Not on file  Financial Resource Strain: Low Risk (05/14/2024)   Overall Financial Resource Strain (CARDIA)    Difficulty of Paying Living Expenses: Not hard at all  Food Insecurity: No Food Insecurity (10/14/2024)   Epic    Worried About Programme Researcher, Broadcasting/film/video in the Last Year: Never true    Ran Out of Food in the Last Year: Never true  Transportation Needs: No Transportation Needs (10/14/2024)   Epic    Lack of Transportation (Medical): No    Lack of Transportation (Non-Medical): No  Physical Activity: Inactive (05/14/2024)   Exercise Vital Sign    Days of Exercise per Week: 0 days    Minutes of Exercise per Session: 0 min  Stress: No Stress Concern Present (05/14/2024)   [employer] of Occupational Health - Occupational Stress Questionnaire    Feeling of Stress: Not at all  Social Connections: Moderately Integrated (05/14/2024)   Social Connection and Isolation Panel    Frequency of Communication with Friends and Family: Twice a week    Frequency of Social Gatherings with Friends and Family: Once a week    Attends Religious Services: More than 4 times per year    Active Member of Clubs or Organizations: Yes    Attends Banker Meetings: More than 4 times per year    Marital Status: Never married  Intimate Partner Violence: Not At Risk (10/14/2024)   Epic    Fear of Current or Ex-Partner: No    Emotionally Abused: No    Physically Abused: No    Sexually Abused: No  Depression (PHQ2-9): Medium Risk (10/09/2024)   Depression (PHQ2-9)    PHQ-2 Score: 7  Alcohol Screen: Low Risk (08/27/2024)   Alcohol Screen    Last Alcohol Screening Score (AUDIT): 0  Housing: Low Risk (05/14/2024)   Epic    Unable to Pay for Housing in the Last Year: No    Number of Times Moved in the Last Year: 0    Homeless in the Last Year: No  Utilities:  Not At Risk (10/14/2024)   Epic    Threatened with loss of utilities: No  Health Literacy: Adequate Health Literacy (05/14/2024)   B1300 Health Literacy    Frequency of need for help with medical instructions: Never    Sleep: Poor  Appetite:  Fair  Current Medications:  Current Facility-Administered Medications  Medication Dose Route Frequency Provider Last Rate Last Admin   acetaminophen  (TYLENOL ) tablet 650 mg  650 mg Oral Q6H PRN Bobbitt, Shalon E, NP  alum & mag hydroxide-simeth (MAALOX/MYLANTA) 200-200-20 MG/5ML suspension 30 mL  30 mL Oral Q4H PRN Bobbitt, Shalon E, NP       amLODipine  (NORVASC ) tablet 2.5 mg  2.5 mg Oral Daily Bobbitt, Shalon E, NP   2.5 mg at 10/16/24 9083   aspirin  chewable tablet 81 mg  81 mg Oral Daily Bobbitt, Shalon E, NP   81 mg at 10/16/24 0916   B-complex with vitamin C tablet 1 tablet  1 tablet Oral QODAY Bobbitt, Shalon E, NP   1 tablet at 10/16/24 0916   calcium  carbonate (OS-CAL - dosed in mg of elemental calcium ) tablet 1,250 mg  1 tablet Oral Q breakfast Bobbitt, Shalon E, NP   1,250 mg at 10/16/24 0916   cholecalciferol  (VITAMIN D3) 25 MCG (1000 UNIT) tablet 1,000 Units  1,000 Units Oral q AM Bobbitt, Shalon E, NP   1,000 Units at 10/16/24 0916   divalproex  (DEPAKOTE  ER) 24 hr tablet 750 mg  750 mg Oral QHS [name], MD   750 mg at 10/15/24 2146   hydrOXYzine  (ATARAX ) tablet 25 mg  25 mg Oral TID PRN [name], MD   25 mg at 10/16/24 9083   magnesium  hydroxide (MILK OF MAGNESIA) suspension 30 mL  30 mL Oral Daily PRN Bobbitt, Shalon E, NP       melatonin tablet 5 mg  5 mg Oral QHS Nkwenti, Doris, NP   5 mg at 10/15/24 2147   neomycin -bacitracin -polymyxin [phone] OINT 1 Application  1 Application Topical Once Bobbitt, Shalon E, NP       OLANZapine  (ZYPREXA ) injection 5 mg  5 mg Intramuscular TID PRN Bobbitt, Shalon E, NP   5 mg at 10/16/24 0553   OLANZapine  (ZYPREXA ) tablet 15 mg  15 mg Oral QHS Elke Holtry, MD   15 mg at 10/15/24 2147    OLANZapine  zydis (ZYPREXA ) disintegrating tablet 5 mg  5 mg Oral TID PRN Bobbitt, Shalon E, NP       tamsulosin  (FLOMAX ) capsule 0.4 mg  0.4 mg Oral Daily Bobbitt, Shalon E, NP   0.4 mg at 10/16/24 9083   Current Outpatient Medications  Medication Sig Dispense Refill   acetaminophen  (TYLENOL ) 500 MG tablet Take 500 mg by mouth at bedtime.     amLODipine  (NORVASC ) 2.5 MG tablet Take 1 tablet (2.5 mg total) by mouth daily. 90 tablet 3   amoxicillin -clavulanate (AUGMENTIN ) 875-125 MG tablet Take 1 tablet by mouth 2 (two) times daily. (Patient taking differently: Take 1 tablet by mouth 2 (two) times daily. Take for 10 days starting on 10/03/24) 20 tablet 0   aspirin  EC 81 MG tablet Take 81 mg by mouth daily. Swallow whole.     B Complex Vitamins (VITAMIN B COMPLEX  PO) Take 1 capsule by mouth every other day.     benztropine  (COGENTIN ) 0.5 MG tablet Take 0.5 mg by mouth at bedtime.     Calcium  Citrate 333 MG TABS Take 333 mg by mouth daily.     cephALEXin  (KEFLEX ) 500 MG capsule Take 500 mg by mouth 4 (four) times daily. Take for 7 days starting on 09/27/24     Cholecalciferol  (VITAMIN D3 PO) Take 1 capsule by mouth in the morning.     divalproex  (DEPAKOTE  ER) 250 MG 24 hr tablet Take 750 mg by mouth at bedtime.     FLUoxetine  (PROZAC ) 20 MG capsule Take 20 mg by mouth in the morning.     hydrOXYzine  (ATARAX /VISTARIL ) 25 MG tablet Take 25 mg by  mouth in the morning and at bedtime.     OLANZapine  zydis (ZYPREXA ) 10 MG disintegrating tablet Take 10 mg by mouth at bedtime.     OLANZapine  zydis (ZYPREXA ) 5 MG disintegrating tablet Take 5 mg by mouth daily.     tadalafil  (CIALIS ) 5 MG tablet Take 1 tablet (5 mg total) by mouth daily. (Patient taking differently: Take 5 mg by mouth at bedtime as needed for erectile dysfunction.) 10 tablet 0   tamsulosin  (FLOMAX ) 0.4 MG CAPS capsule Take 1 capsule (0.4 mg total) by mouth daily. 90 capsule 3    Labs  Lab Results:  Admission on 10/14/2024  Component Date  Value Ref Range Status   POC Amphetamine UR 10/14/2024 None Detected  NONE DETECTED (Cut Off Level 1000 ng/mL) Final   POC Secobarbital (BAR) 10/14/2024 None Detected  NONE DETECTED (Cut Off Level 300 ng/mL) Final   POC Buprenorphine (BUP) 10/14/2024 None Detected  NONE DETECTED (Cut Off Level 10 ng/mL) Final   POC Oxazepam (BZO) 10/14/2024 None Detected  NONE DETECTED (Cut Off Level 300 ng/mL) Final   POC Cocaine UR 10/14/2024 None Detected  NONE DETECTED (Cut Off Level 300 ng/mL) Final   POC Methamphetamine UR 10/14/2024 None Detected  NONE DETECTED (Cut Off Level 1000 ng/mL) Final   POC Morphine  10/14/2024 None Detected  NONE DETECTED (Cut Off Level 300 ng/mL) Final   POC Methadone UR 10/14/2024 None Detected  NONE DETECTED (Cut Off Level 300 ng/mL) Final   POC Oxycodone  UR 10/14/2024 None Detected  NONE DETECTED (Cut Off Level 100 ng/mL) Final   POC Marijuana UR 10/14/2024 Positive (A)  NONE DETECTED (Cut Off Level 50 ng/mL) Final   Sodium 10/15/2024 132 (L)  135 - 145 mmol/L Final   Potassium 10/15/2024 4.5  3.5 - 5.1 mmol/L Final   Chloride 10/15/2024 96 (L)  98 - 111 mmol/L Final   CO2 10/15/2024 25  22 - 32 mmol/L Final   Glucose, Bld 10/15/2024 101 (H)  70 - 99 mg/dL Final   Glucose reference range applies only to samples taken after fasting for at least 8 hours.   BUN 10/15/2024 11  8 - 23 mg/dL Final   Creatinine, Ser 10/15/2024 0.73  0.61 - 1.24 mg/dL Final   Calcium  10/15/2024 9.7  8.9 - 10.3 mg/dL Final   GFR, Estimated 10/15/2024 >60  >60 mL/min Final   Comment: (NOTE) Calculated using the CKD-EPI Creatinine Equation (2021)    Anion gap 10/15/2024 11  5 - 15 Final   Performed at [facility], [address], [place], [place] [address]   Vit D, 25-Hydroxy 10/15/2024 56.6  30 - 100 ng/mL Final   Comment: (NOTE) Vitamin D  deficiency has been defined by the Institute of Medicine  and an Endocrine Society practice guideline as a level of serum 25-OH  vitamin D  less than  20 ng/mL (1,2). The Endocrine Society went on to  further define vitamin D  insufficiency as a level between 21 and 29  ng/mL (2).  1. IOM (Institute of Medicine). 2010. Dietary reference intakes for  calcium  and D. [place]: [employer]. 2. Holick MF, Bink[place], Bischoff-Ferrari HA, et al. Evaluation,  treatment, and prevention of vitamin D  deficiency: an Endocrine  Society clinical practice guideline, JCEM. 2011 Jul; 96(7): 1911-30.  Performed at [facility], [address], [place], [place] [address]    Vitamin B-12 10/15/2024 728  180 - 914 pg/mL Final   Performed at [facility], [address], [place], [place] [address]  Admission on 10/14/2024, Discharged on 10/14/2024  Component Date Value Ref Range Status   WBC 10/14/2024 6.0  4.0 - 10.5 K/uL Final   RBC 10/14/2024 4.48  4.22 - 5.81 MIL/uL Final   Hemoglobin 10/14/2024 12.9 (L)  13.0 - 17.0 g/dL Final   HCT 98/80/7973 37.4 (L)  39.0 - 52.0 % Final   MCV 10/14/2024 83.5  80.0 - 100.0 fL Final   MCH 10/14/2024 28.8  26.0 - 34.0 pg Final   MCHC 10/14/2024 34.5  30.0 - 36.0 g/dL Final   RDW 98/80/7973 14.5  11.5 - 15.5 % Final   Platelets 10/14/2024 207  150 - 400 K/uL Final   nRBC 10/14/2024 0.0  0.0 - 0.2 % Final   Neutrophils Relative % 10/14/2024 67  % Final   Neutro Abs 10/14/2024 4.0  1.7 - 7.7 K/uL Final   Lymphocytes Relative 10/14/2024 20  % Final   Lymphs Abs 10/14/2024 1.2  0.7 - 4.0 K/uL Final   Monocytes Relative 10/14/2024 10  % Final   Monocytes Absolute 10/14/2024 0.6  0.1 - 1.0 K/uL Final   Eosinophils Relative 10/14/2024 2  % Final   Eosinophils Absolute 10/14/2024 0.1  0.0 - 0.5 K/uL Final   Basophils Relative 10/14/2024 1  % Final   Basophils Absolute 10/14/2024 0.0  0.0 - 0.1 K/uL Final   Immature Granulocytes 10/14/2024 0  % Final   Abs Immature Granulocytes 10/14/2024 0.02  0.00 - 0.07 K/uL Final   Performed at [facility], [address]  [place], [place] [address]   Sodium 10/14/2024 133 (L)  135 - 145 mmol/L Final   Potassium 10/14/2024 4.1  3.5 - 5.1 mmol/L Final   Chloride 10/14/2024 98  98 - 111 mmol/L Final   CO2 10/14/2024 24  22 - 32 mmol/L Final   Glucose, Bld 10/14/2024 103 (H)  70 - 99 mg/dL Final   Glucose reference range applies only to samples taken after fasting for at least 8 hours.   BUN 10/14/2024 14  8 - 23 mg/dL Final   Creatinine, Ser 10/14/2024 0.76  0.61 - 1.24 mg/dL Final   Calcium  10/14/2024 9.6  8.9 - 10.3 mg/dL Final   GFR, Estimated 10/14/2024 >60  >60 mL/min Final   Comment: (NOTE) Calculated using the CKD-EPI Creatinine Equation (2021)    Anion gap 10/14/2024 11  5 - 15 Final   Performed at [facility], [address] [place], [place] [address]   Color, Urine 10/14/2024 YELLOW  YELLOW Final   APPearance 10/14/2024 CLEAR  CLEAR Final   Specific Gravity, Urine 10/14/2024 1.015  1.005 - 1.030 Final   pH 10/14/2024 6.0  5.0 - 8.0 Final   Glucose, UA 10/14/2024 NEGATIVE  NEGATIVE mg/dL Final   Hgb urine dipstick 10/14/2024 NEGATIVE  NEGATIVE Final   Bilirubin Urine 10/14/2024 NEGATIVE  NEGATIVE Final   Ketones, ur 10/14/2024 5 (A)  NEGATIVE mg/dL Final   Protein, ur 98/80/7973 NEGATIVE  NEGATIVE mg/dL Final   Nitrite 98/80/7973 NEGATIVE  NEGATIVE Final   Leukocytes,Ua 10/14/2024 NEGATIVE  NEGATIVE Final   RBC / HPF 10/14/2024 0-5  0 - 5 RBC/hpf Final   WBC, UA 10/14/2024 0-5  0 - 5 WBC/hpf Final   Bacteria, UA 10/14/2024 RARE (A)  NONE SEEN Final   Squamous Epithelial / HPF 10/14/2024 0-5  0 - 5 /HPF Final   Mucus 10/14/2024 PRESENT   Final  Performed at [facility], [address], [place], [place] [address]  Admission on 10/13/2024, Discharged on 10/14/2024  Component Date Value Ref Range Status   SARS Coronavirus 2 by RT PCR 10/13/2024 NEGATIVE  NEGATIVE Final   Performed at [facility], [address], [place], [place] [address]   WBC  10/13/2024 6.3  4.0 - 10.5 K/uL Final   RBC 10/13/2024 4.58  4.22 - 5.81 MIL/uL Final   Hemoglobin 10/13/2024 13.4  13.0 - 17.0 g/dL Final   HCT 98/81/7973 37.6 (L)  39.0 - 52.0 % Final   MCV 10/13/2024 82.1  80.0 - 100.0 fL Final   MCH 10/13/2024 29.3  26.0 - 34.0 pg Final   MCHC 10/13/2024 35.6  30.0 - 36.0 g/dL Final   RDW 98/81/7973 14.4  11.5 - 15.5 % Final   Platelets 10/13/2024 227  150 - 400 K/uL Final   nRBC 10/13/2024 0.0  0.0 - 0.2 % Final   Neutrophils Relative % 10/13/2024 68  % Final   Neutro Abs 10/13/2024 4.3  1.7 - 7.7 K/uL Final   Lymphocytes Relative 10/13/2024 18  % Final   Lymphs Abs 10/13/2024 1.1  0.7 - 4.0 K/uL Final   Monocytes Relative 10/13/2024 10  % Final   Monocytes Absolute 10/13/2024 0.6  0.1 - 1.0 K/uL Final   Eosinophils Relative 10/13/2024 2  % Final   Eosinophils Absolute 10/13/2024 0.2  0.0 - 0.5 K/uL Final   Basophils Relative 10/13/2024 1  % Final   Basophils Absolute 10/13/2024 0.1  0.0 - 0.1 K/uL Final   Immature Granulocytes 10/13/2024 1  % Final   Abs Immature Granulocytes 10/13/2024 0.04  0.00 - 0.07 K/uL Final   Performed at [facility], [address] [place], [place] [address]   Sodium 10/13/2024 135  135 - 145 mmol/L Final   Potassium 10/13/2024 4.3  3.5 - 5.1 mmol/L Final   Chloride 10/13/2024 99  98 - 111 mmol/L Final   CO2 10/13/2024 23  22 - 32 mmol/L Final   Glucose, Bld 10/13/2024 91  70 - 99 mg/dL Final   Glucose reference range applies only to samples taken after fasting for at least 8 hours.   BUN 10/13/2024 11  8 - 23 mg/dL Final   Creatinine, Ser 10/13/2024 0.80  0.61 - 1.24 mg/dL Final   Calcium  10/13/2024 9.7  8.9 - 10.3 mg/dL Final   Total Protein 98/81/7973 7.2  6.5 - 8.1 g/dL Final   Albumin 98/81/7973 4.3  3.5 - 5.0 g/dL Final   AST 98/81/7973 13 (L)  15 - 41 U/L Final   ALT 10/13/2024 12  0 - 44 U/L Final   Alkaline Phosphatase 10/13/2024 87  38 - 126 U/L Final   Total Bilirubin 10/13/2024 0.7  0.0 - 1.2  mg/dL Final   GFR, Estimated 10/13/2024 >60  >60 mL/min Final   Comment: (NOTE) Calculated using the CKD-EPI Creatinine Equation (2021)    Anion gap 10/13/2024 13  5 - 15 Final   Performed at [facility], [address] [place], [place] [address]   Hgb A1c MFr Bld 10/13/2024 5.5  4.8 - 5.6 % Final   Comment: (NOTE) Diagnosis of Diabetes The following HbA1c ranges recommended by the American Diabetes Association (ADA) may be used as an aid in the diagnosis of diabetes mellitus.  Hemoglobin             Suggested A1C NGSP%  Diagnosis  <5.7                   Non Diabetic  5.7-6.4                Pre-Diabetic  >6.4                   Diabetic  <7.0                   Glycemic control for                       adults with diabetes.     Mean Plasma Glucose 10/13/2024 111.15  mg/dL Final   Performed at [facility], [address], [place], [place] [address]   Alcohol, Ethyl (B) 10/13/2024 <15  <15 mg/dL Final   Comment: (NOTE) For medical purposes only. Performed at [facility], [address] [place], [place] [address]    Cholesterol 10/13/2024 156  0 - 200 mg/dL Final   Comment:        ATP III CLASSIFICATION:  <200     mg/dL   Desirable  799-760  mg/dL   Borderline High  >=759    mg/dL   High           Triglycerides 10/13/2024 63  <150 mg/dL Final   HDL 98/81/7973 63  >40 mg/dL Final   Total CHOL/HDL Ratio 10/13/2024 2.5  RATIO Final   VLDL 10/13/2024 13  0 - 40 mg/dL Final   LDL Cholesterol 10/13/2024 80  0 - 99 mg/dL Final   Comment:        Total Cholesterol/HDL:CHD Risk Coronary Heart Disease Risk Table                     Men   Women  1/2 Average Risk   3.4   3.3  Average Risk       5.0   4.4  2 X Average Risk   9.6   7.1  3 X Average Risk  23.4   11.0        Use the calculated Patient Ratio above and the CHD Risk Table to determine the patient's CHD Risk.        ATP III CLASSIFICATION (LDL):  <100     mg/dL   Optimal   899-870  mg/dL   Near or Above                    Optimal  130-159  mg/dL   Borderline  839-810  mg/dL   High  >809     mg/dL   Very High Performed at [facility], [address] [place], [place] [address]    TSH 10/13/2024 1.380  0.350 - 4.500 uIU/mL Final   Performed at [facility], [address] [place], [place] [address]   Valproic Acid  Lvl 10/13/2024 49 (L)  50 - 100 ug/mL Final   Performed at [facility], [address] [place], [place] [address]  Office Visit on 10/09/2024  Component Date Value Ref Range Status   Sodium 10/09/2024 130 (L)  135 - 145 mEq/L Final   Potassium 10/09/2024 4.2  3.5 - 5.1 mEq/L Final   Chloride 10/09/2024 95 (L)  96 - 112 mEq/L Final   CO2 10/09/2024 30  19 - 32 mEq/L Final   Elevated LDH levels may cause  falsely increased CO2 results. If LDH is >2000 U/L, a positive bias of 12% is possible.   Glucose, Bld 10/09/2024 89  70 - 99 mg/dL Final   BUN 98/85/7973 6  6 - 23 mg/dL Final   Creatinine, Ser 10/09/2024 0.67  0.40 - 1.50 mg/dL Final   Total Bilirubin 10/09/2024 0.5  0.2 - 1.2 mg/dL Final   Alkaline Phosphatase 10/09/2024 77  39 - 117 U/L Final   AST 10/09/2024 9  5 - 37 U/L Final   ALT 10/09/2024 13  3 - 53 U/L Final   Total Protein 10/09/2024 7.1  6.0 - 8.3 g/dL Final   Albumin 98/85/7973 4.1  3.5 - 5.2 g/dL Final   GFR 98/85/7973 93.22  >60.00 mL/min Final   Calculated using the CKD-EPI Creatinine Equation (2021)   Calcium  10/09/2024 9.8  8.4 - 10.5 mg/dL Final   WBC 98/85/7973 5.0  4.0 - 10.5 K/uL Final   RBC 10/09/2024 4.44  4.22 - 5.81 Mil/uL Final   Hemoglobin 10/09/2024 13.0  13.0 - 17.0 g/dL Final   HCT 98/85/7973 37.1 (L)  39.0 - 52.0 % Final   MCV 10/09/2024 83.5  78.0 - 100.0 fl Final   MCHC 10/09/2024 34.9  30.0 - 36.0 g/dL Final   RDW 98/85/7973 14.5  11.5 - 15.5 % Final   Platelets 10/09/2024 259.0  150.0 - 400.0 K/uL Final   Neutrophils Relative % 10/09/2024 63.3  43.0 - 77.0 % Final   Lymphocytes  Relative 10/09/2024 20.5  12.0 - 46.0 % Final   Monocytes Relative 10/09/2024 10.2  3.0 - 12.0 % Final   Eosinophils Relative 10/09/2024 4.9  0.0 - 5.0 % Final   Basophils Relative 10/09/2024 1.1  0.0 - 3.0 % Final   Neutro Abs 10/09/2024 3.2  1.4 - 7.7 K/uL Final   Lymphs Abs 10/09/2024 1.0  0.7 - 4.0 K/uL Final   Monocytes Absolute 10/09/2024 0.5  0.1 - 1.0 K/uL Final   Eosinophils Absolute 10/09/2024 0.2  0.0 - 0.7 K/uL Final   Basophils Absolute 10/09/2024 0.1  0.0 - 0.1 K/uL Final  Admission on 10/03/2024, Discharged on 10/03/2024  Component Date Value Ref Range Status   Specimen Description 10/03/2024    Final                   Value:FLUID Performed at [facility], [address] [place], [place] [address]    Special Requests 10/03/2024    Final                   Value:NONE Performed at [facility], [address] [place], [place] [address]    Gram Stain 10/03/2024    Final                   Value:NO WBC SEEN NO ORGANISMS SEEN    Culture 10/03/2024    Final                   Value:RARE STAPHYLOCOCCUS AUREUS NO ANAEROBES ISOLATED Performed at [facility], [address] [place], [place] [address]    Report Status 10/03/2024 10/08/2024 FINAL   Final   Organism ID, Bacteria 10/03/2024 STAPHYLOCOCCUS AUREUS   Final  Office Visit on 09/30/2024  Component Date Value Ref Range Status   MICRO NUMBER: 09/30/2024 [id number]   Final   SPECIMEN QUALITY: 09/30/2024 Adequate   Final   SOURCE: 09/30/2024 LEFT ANTERIOR KNEE   Final  STATUS: 09/30/2024 FINAL   Final   AER ISOLATE 1: 09/30/2024 Staphylococcus aureus (A)   Final   Scant growth of Staphylococcus aureus Negative for inducible clindamycin  resistance.  Admission on 09/14/2024, Discharged on 09/14/2024  Component Date Value Ref Range Status   Color, Urine 09/14/2024 YELLOW  YELLOW Final   APPearance 09/14/2024 CLOUDY (A)  CLEAR Final   Specific Gravity, Urine 09/14/2024 1.004  (L)  1.005 - 1.030 Final   pH 09/14/2024 7.0  5.0 - 8.0 Final   Glucose, UA 09/14/2024 NEGATIVE  NEGATIVE mg/dL Final   Hgb urine dipstick 09/14/2024 MODERATE (A)  NEGATIVE Final   Bilirubin Urine 09/14/2024 NEGATIVE  NEGATIVE Final   Ketones, ur 09/14/2024 NEGATIVE  NEGATIVE mg/dL Final   Protein, ur 87/79/7974 NEGATIVE  NEGATIVE mg/dL Final   Nitrite 87/79/7974 NEGATIVE  NEGATIVE Final   Leukocytes,Ua 09/14/2024 LARGE (A)  NEGATIVE Final   RBC / HPF 09/14/2024 11-20  0 - 5 RBC/hpf Final   WBC, UA 09/14/2024 >50  0 - 5 WBC/hpf Final   Bacteria, UA 09/14/2024 RARE (A)  NONE SEEN Final   Squamous Epithelial / HPF 09/14/2024 0-5  0 - 5 /HPF Final   WBC Clumps 09/14/2024 PRESENT   Final   Non Squamous Epithelial 09/14/2024 0-5 (A)  NONE SEEN Final   Performed at [facility], [address] [place], [place] [address]   Sodium 09/14/2024 132 (L)  135 - 145 mmol/L Final   Potassium 09/14/2024 4.0  3.5 - 5.1 mmol/L Final   Chloride 09/14/2024 97 (L)  98 - 111 mmol/L Final   CO2 09/14/2024 23  22 - 32 mmol/L Final   Glucose, Bld 09/14/2024 103 (H)  70 - 99 mg/dL Final   Glucose reference range applies only to samples taken after fasting for at least 8 hours.   BUN 09/14/2024 6 (L)  8 - 23 mg/dL Final   Creatinine, Ser 09/14/2024 0.65  0.61 - 1.24 mg/dL Final   Calcium  09/14/2024 9.7  8.9 - 10.3 mg/dL Final   GFR, Estimated 09/14/2024 >60  >60 mL/min Final   Comment: (NOTE) Calculated using the CKD-EPI Creatinine Equation (2021)    Anion gap 09/14/2024 11  5 - 15 Final   Performed at [facility], [address] [place], [place] [address]   WBC 09/14/2024 10.0  4.0 - 10.5 K/uL Final   RBC 09/14/2024 4.57  4.22 - 5.81 MIL/uL Final   Hemoglobin 09/14/2024 13.3  13.0 - 17.0 g/dL Final   HCT 87/79/7974 38.1 (L)  39.0 - 52.0 % Final   MCV 09/14/2024 83.4  80.0 - 100.0 fL Final   MCH 09/14/2024 29.1  26.0 - 34.0 pg Final   MCHC 09/14/2024 34.9  30.0 -  36.0 g/dL Final   RDW 87/79/7974 14.0  11.5 - 15.5 % Final   Platelets 09/14/2024 189  150 - 400 K/uL Final   nRBC 09/14/2024 0.0  0.0 - 0.2 % Final   Performed at [facility], [address] [place], [place] [address]   Specimen Description 09/14/2024    Final                   Value:URINE, CLEAN CATCH Performed at [facility], [address], [place], [place] [address]    Special Requests 09/14/2024    Final                   Value:NONE Performed at [facility], [address], [place], [place] [address]    Culture 09/14/2024 >=100,000 COLONIES/mL PSEUDOMONAS AERUGINOSA (A)   Final   Report Status 09/14/2024 09/17/2024 FINAL   Final   Organism ID, Bacteria 09/14/2024 PSEUDOMONAS AERUGINOSA (A)   Final  Hospital Outpatient Visit on 07/11/2024  Component Date Value Ref Range Status   WBC 07/11/2024 4.5  4.0 - 10.5 K/uL Final   RBC 07/11/2024 4.76  4.22 - 5.81 MIL/uL Final   Hemoglobin 07/11/2024 14.4  13.0 - 17.0 g/dL Final   HCT 89/83/7974 40.6  39.0 - 52.0 % Final   MCV 07/11/2024 85.3  80.0 - 100.0 fL Final   MCH 07/11/2024 30.3  26.0 - 34.0 pg Final   MCHC 07/11/2024 35.5  30.0 - 36.0 g/dL Final   RDW 89/83/7974 12.7  11.5 - 15.5 % Final   Platelets 07/11/2024 201  150 - 400 K/uL Final   nRBC 07/11/2024 0.0  0.0 - 0.2 % Final   Performed at [facility], [address], [place], [place] [address]   Prothrombin Time 07/11/2024 12.9  11.4 - 15.2 seconds Final   INR 07/11/2024 0.9  0.8 - 1.2 Final   Comment: (NOTE) INR goal varies based on device and disease states. Performed at [facility], [address], [place], [place] [address]    Sodium 07/11/2024 134 (L)  135 - 145 mmol/L Final   Potassium 07/11/2024 4.1  3.5 - 5.1 mmol/L Final   Chloride 07/11/2024 99  98 - 111 mmol/L Final   CO2 07/11/2024 23  22 - 32 mmol/L Final   Glucose, Bld 07/11/2024 97  70 - 99 mg/dL Final   Glucose reference range  applies only to samples taken after fasting for at least 8 hours.   BUN 07/11/2024 5 (L)  8 - 23 mg/dL Final   Creatinine, Ser 07/11/2024 0.70  0.61 - 1.24 mg/dL Final   Calcium  07/11/2024 9.7  8.9 - 10.3 mg/dL Final   GFR, Estimated 07/11/2024 >60  >60 mL/min Final   Comment: (NOTE) Calculated using the CKD-EPI Creatinine Equation (2021)    Anion gap 07/11/2024 12  5 - 15 Final   Performed at [facility], [address], [place], [place] [address]   Glucose-Capillary 07/11/2024 100 (H)  70 - 99 mg/dL Final   Glucose reference range applies only to samples taken after fasting for at least 8 hours.  Office Visit on 05/29/2024  Component Date Value Ref Range Status   Color, UA 05/29/2024 yellow   Final   Clarity, UA 05/29/2024 cloudy   Final   Glucose, UA 05/29/2024 Negative  Negative Final   Bilirubin, UA 05/29/2024 neg   Final   Ketones, UA 05/29/2024 neg   Final   Spec Grav, UA 05/29/2024 1.010  1.010 - 1.025 Final   Blood, UA 05/29/2024 10 ery/uL   Final   pH, UA 05/29/2024 6.0  5.0 - 8.0 Final   Protein, UA 05/29/2024 Negative  Negative Final   Urobilinogen, UA 05/29/2024 0.2  0.2 or 1.0 E.U./dL Final   Nitrite, UA 90/96/7974 neg   Final   Leukocytes, UA 05/29/2024 Large (3+) (A)  Negative Final   WBC 05/29/2024 3.5 (L)  4.0 - 10.5 K/uL Final   RBC 05/29/2024 4.44  4.22 - 5.81 Mil/uL Final   Hemoglobin 05/29/2024 14.0  13.0 - 17.0 g/dL Final   HCT 90/96/7974 41.1  39.0 - 52.0 % Final   MCV 05/29/2024 92.5  78.0 - 100.0 fl Final   MCHC 05/29/2024 34.1  30.0 - 36.0 g/dL Final   RDW 90/96/7974 13.4  11.5 - 15.5 % Final   Platelets 05/29/2024 189.0  150.0 - 400.0 K/uL Final   Neutrophils Relative % 05/29/2024 65.3  43.0 - 77.0 % Final   Lymphocytes Relative 05/29/2024 20.9  12.0 - 46.0 % Final   Monocytes Relative 05/29/2024 10.9  3.0 - 12.0 % Final   Eosinophils Relative 05/29/2024 2.2  0.0 - 5.0 % Final   Basophils Relative 05/29/2024 0.7  0.0 - 3.0 % Final   Neutro Abs  05/29/2024 2.3  1.4 - 7.7 K/uL Final   Lymphs Abs 05/29/2024 0.7  0.7 - 4.0 K/uL Final   Monocytes Absolute 05/29/2024 0.4  0.1 - 1.0 K/uL Final   Eosinophils Absolute 05/29/2024 0.1  0.0 - 0.7 K/uL Final   Basophils Absolute 05/29/2024 0.0  0.0 - 0.1 K/uL Final   Sodium 05/29/2024 135  135 - 145 mEq/L Final   Potassium 05/29/2024 3.8  3.5 - 5.1 mEq/L Final   Chloride 05/29/2024 102  96 - 112 mEq/L Final   CO2 05/29/2024 27  19 - 32 mEq/L Final   Glucose, Bld 05/29/2024 85  70 - 99 mg/dL Final   BUN 90/96/7974 7  6 - 23 mg/dL Final   Creatinine, Ser 05/29/2024 0.66  0.40 - 1.50 mg/dL Final   GFR 90/96/7974 93.88  >60.00 mL/min Final   Calculated using the CKD-EPI Creatinine Equation (2021)   Calcium  05/29/2024 9.3  8.4 - 10.5 mg/dL Final   MICRO NUMBER: 90/96/7974 [id number]   Final   SPECIMEN QUALITY: 05/29/2024 Adequate   Final   Sample Source 05/29/2024 URINE   Final   STATUS: 05/29/2024 FINAL   Final   ISOLATE 1: 05/29/2024 Enterococcus faecalis (A)   Final   Greater than 100,000 CFU/mL of Enterococcus faecalis  Office Visit on 04/30/2024  Component Date Value Ref Range Status   WBC 04/30/2024 4.8  4.0 - 10.5 K/uL Final   RBC 04/30/2024 4.37  4.22 - 5.81 Mil/uL Final   Hemoglobin 04/30/2024 13.8  13.0 - 17.0 g/dL Final   HCT 91/94/7974 40.3  39.0 - 52.0 % Final   MCV 04/30/2024 92.3  78.0 - 100.0 fl Final   MCHC 04/30/2024 34.3  30.0 - 36.0 g/dL Final   RDW 91/94/7974 13.5  11.5 - 15.5 % Final   Platelets 04/30/2024 273.0  150.0 - 400.0 K/uL Final   Neutrophils Relative % 04/30/2024 69.0  43.0 - 77.0 % Final   Lymphocytes Relative 04/30/2024 19.8  12.0 - 46.0 % Final   Monocytes Relative 04/30/2024 7.8  3.0 - 12.0 % Final   Eosinophils Relative 04/30/2024 2.2  0.0 - 5.0 % Final   Basophils Relative 04/30/2024 1.2  0.0 - 3.0 % Final   Neutro Abs 04/30/2024 3.3  1.4 - 7.7 K/uL Final   Lymphs Abs 04/30/2024 0.9  0.7 - 4.0 K/uL Final   Monocytes Absolute 04/30/2024 0.4  0.1 - 1.0  K/uL Final   Eosinophils Absolute 04/30/2024 0.1  0.0 - 0.7 K/uL Final   Basophils Absolute 04/30/2024 0.1  0.0 - 0.1 K/uL Final   Magnesium  04/30/2024 2.0  1.5 - 2.5 mg/dL Final   Sodium 91/94/7974 135  135 - 145 mEq/L Final   Potassium 04/30/2024 4.2  3.5 - 5.1 mEq/L Final   Chloride 04/30/2024 98  96 - 112 mEq/L Final   CO2 04/30/2024 29  19 - 32 mEq/L Final  Glucose, Bld 04/30/2024 87  70 - 99 mg/dL Final   BUN 91/94/7974 6  6 - 23 mg/dL Final   Creatinine, Ser 04/30/2024 0.65  0.40 - 1.50 mg/dL Final   Total Bilirubin 04/30/2024 0.6  0.2 - 1.2 mg/dL Final   Alkaline Phosphatase 04/30/2024 71  39 - 117 U/L Final   AST 04/30/2024 12  0 - 37 U/L Final   ALT 04/30/2024 21  0 - 53 U/L Final   Total Protein 04/30/2024 6.7  6.0 - 8.3 g/dL Final   Albumin 91/94/7974 4.4  3.5 - 5.2 g/dL Final   GFR 91/94/7974 94.37  >60.00 mL/min Final   Calculated using the CKD-EPI Creatinine Equation (2021)   Calcium  04/30/2024 9.9  8.4 - 10.5 mg/dL Final   Cholesterol 91/94/7974 160  0 - 200 mg/dL Final   ATP III Classification       Desirable:  < 200 mg/dL               Borderline High:  200 - 239 mg/dL          High:  > = 759 mg/dL   Triglycerides 91/94/7974 118.0  0.0 - 149.0 mg/dL Final   Normal:  <849 mg/dLBorderline High:  150 - 199 mg/dL   HDL 91/94/7974 43.99  >39.00 mg/dL Final   VLDL 91/94/7974 23.6  0.0 - 40.0 mg/dL Final   LDL Cholesterol 04/30/2024 81  0 - 99 mg/dL Final   Total CHOL/HDL Ratio 04/30/2024 3   Final                  Men          Women1/2 Average Risk     3.4          3.3Average Risk          5.0          4.42X Average Risk          9.6          7.13X Average Risk          15.0          11.0                       NonHDL 04/30/2024 104.47   Final   NOTE:  Non-HDL goal should be 30 mg/dL higher than patient's LDL goal (i.e. LDL goal of < 70 mg/dL, would have non-HDL goal of < 100 mg/dL)   PSA 04/30/2024 11.79 (H)  0.10 - 4.00 ng/mL Final   Test performed using [employer]  PSA Assay, a parmagnetic partical, chemiluminecent immunoassay.   TSH 04/30/2024 0.83  0.35 - 5.50 uIU/mL Final   Vitamin B-12 04/30/2024 653  211 - 911 pg/mL Final   Vitamin B1 (Thiamine ) 04/30/2024 28  8 - 30 nmol/L Final   Comment: (Note) Vitamin supplementation within 24 hours prior to blood  draw may affect the accuracy of the results. . This test was developed and its analytical performance  characteristics have been determined by [employer]. It has not been cleared or approved by FDA.  This assay has been validated pursuant to the CLIA  regulations and is used for clinical purposes. . [facility] [address] [place] [address] [phone] Johanna Agent L. Frame, MD, PhD    VITD 04/30/2024 70.02  30.00 - 100.00 ng/mL Final   Valproic Acid  Lvl 04/30/2024 48.8 (L)  50.0 - 100.0  mg/L Final   QUESTION/PROBLEM: 04/30/2024    Final   Comment: . Please clarify the following specimen source/type submitted. .    QUESTION: 04/30/2024 VERIFY SOURCE OF OT   Final   Comment: REQUESTED INFORMATION _________________________________ . AUTHORIZED SIGNATURE __________________________________ . TO PREVENT FURTHER DELAYS IN TESTING, PLEASE COMPLETE INFORMATION ABOVE AND EITHER FAX TO [phone] OR  EMAIL TO [email or web address] TO  RESOLVE THIS ORDER.   There may be more visits with results that are not included.    Blood Alcohol level:  Lab Results  Component Value Date   [facility] <15 10/13/2024   ETH <5 04/11/2017    Metabolic Disorder Labs: Lab Results  Component Value Date   HGBA1C 5.5 10/13/2024   MPG 111.15 10/13/2024   No results found for: PROLACTIN Lab Results  Component Value Date   CHOL 156 10/13/2024   TRIG 63 10/13/2024   HDL 63 10/13/2024   CHOLHDL 2.5 10/13/2024   VLDL 13 10/13/2024   LDLCALC 80 10/13/2024   LDLCALC 81 04/30/2024    Therapeutic Lab Levels: No results found for: LITHIUM Lab  Results  Component Value Date   VALPROATE 49 (L) 10/13/2024   VALPROATE 48.8 (L) 04/30/2024   No results found for: CBMZ  Physical Findings   AIMS    Flowsheet Row Ad[facility] (Discharged) [facility]  AIMS Total Score 0   AUDIT    Flowsheet Row Clinical Support from 05/09/2023 in [facility] Clinical Support from 05/05/2022 in [facility] Admission (Discharged) from OP [facility]  Alcohol Use Disorder Identification Test Final Score (AUDIT) 9 4 5    GAD-7    Flowsheet Row Office Visit from 10/09/2024 in [facility] [place] [facility] Office Visit from 10/02/2024 in [facility] [place] [facility] Office Visit from 09/30/2024 in [facility] [place] [facility] Office Visit from 08[facility]27/2024 in [facility] [place] Office Visit from 05/17/2024 in [facility] [place] [facility]  Total GAD-7 Score 7 7 7 7 21    Mini-Mental    Flowsheet Row Clinical Support from 03/18/2019 in [facility] [place] [facility] Clinical Support from 03/14/2018 in [facility] Clinical Support from 03/08/2017 in [facility] [place] [facility]  Total Score (max 30 points ) 17 20 18    PHQ2-9    Flowsheet Row Office Visit from 10/09/2024 in [facility] [place] [facility] Office Visit from 10/02/2024 in [facility] [place] [facility] Office Visit from 09/30/2024 in [facility] [place] [facility] Office Visit from 08/27/2024 in [facility] [place] [facility] Office Visit from 05/29/2024 in [facility] [place] [facility] [place]  PHQ-2 Total Score 1 1 1 1 3   PHQ-9 Total Score 7 2 3 6 21    Flowsheet Row ED from 10/14/2024 in  [facility] Most recent reading at 10/14/2024  5:25 AM ED from 10/14/2024 in [facility] at [facility] Most recent reading at 10/14/2024  1:57 AM ED from 10/13/2024 in [facility] Most recent reading at 10/13/2024  1:52 PM  C-SSRS RISK CATEGORY No Risk No Risk No Risk     Musculoskeletal  Strength & Muscle Tone: within normal limits  Gait & Station: normal Patient leans: N/A  Psychiatric Specialty Exam  Presentation  General Appearance:  Appropriate for Environment  Eye Contact: Fair  Speech: Pressured  Speech Volume: Increased  Handedness: Right   Mood and Affect  Mood: Anxious  Affect: Congruent   Thought Process  Thought Processes: Disorganized  Descriptions of Associations:Circumstantial  Orientation:Full (Time, Place and Person)  Thought Content: Ruminates about how he has not been transferred yet Diagnosis of Schizophrenia or Schizoaffective disorder in past: Yes  Duration of Psychotic Symptoms: N/A   Hallucinations: hears his sister's voice despite sister not being here  Ideas of Reference:None  Suicidal Thoughts: denies  Homicidal Thoughts: denies   Sensorium  Memory: Immediate Fair  Judgment: Poor  Insight: Poor   Executive Functions  Concentration: Poor  Attention Span: Poor  Recall: [employer] of Knowledge: Fair  Language: Fair   Psychomotor Activity  Psychomotor Activity: wnl   Assets  Assets: Desire for Improvement   Sleep  Sleep: No data recorded  No Safety Checks orders active in given range  No data recorded  Physical Exam  Physical Exam Vitals and nursing note reviewed.  Constitutional:      Appearance: Normal appearance.  Eyes:     Pupils: Pupils are equal, round, and reactive to light.  Musculoskeletal:     Cervical back: Normal range of motion.  Neurological:     General: No focal deficit present.      Mental Status: He is alert and oriented to person, place, and time.    Review of Systems  Psychiatric/Behavioral:  Positive for depression. Negative for hallucinations, memory loss, substance abuse and suicidal ideas. The patient is nervous/anxious and has insomnia.   All other systems reviewed and are negative.  Blood pressure (!) 145/77, pulse 74, temperature 98.3 F (36.8 C), resp. rate 18, SpO2 99%. There is no height or weight on file to calculate BMI.  Treatment Plan Summary: Daily contact with patient to assess and evaluate symptoms and progress in treatment and Medication management  Patient remains manic and appears to be experiencing some psychotic symptoms. He is at high risk for delirium while at the observation unit as it is loud, no visitation, indwelling catheter, lights persistently on. Increasing olanzapine  given refractory psychosis. He received agitation protocol this AM due to agitation.   Meds -depakote  ER 750 mg at bedtime  -hepatic function and cbc wnl -increase olanzapine  to 5/15 mg bid -delirium precautions   PRNS -Continue Tylenol  650 mg every 6 hours PRN for mild pain -Continue Maalox 30 mg every 4 hrs PRN for indigestion -Continue Milk of Magnesia as needed every 6 hrs for constipation -Continue Abuterol inhaler every 6 hours PRN for wheezing/SOB  Labs: Reviewed: ammonia collected but still pending. BMP shows relatively stable hyponatremia at 132  Discharge Planning: Social work and case management to assist with discharge planning and identification of hospital follow-up needs prior to discharge Estimated LOS: 5-7 days Discharge Concerns: Need to establish a safety plan; Medication compliance and effectiveness Discharge Goals: Return home with outpatient referrals for mental health follow-up including medication management/psychotherapy   Prentice Espy, MD 10/16/2024 9:42 AM

## 2024-10-16 NOTE — ED Notes (Signed)
 Pt is sitting on the side of his bed talking to himself.

## 2024-10-16 NOTE — ED Notes (Signed)
 Report called to charge RN at [facility] ED at this time.  Pt transferred per EMS/stretcher at this time.

## 2024-10-16 NOTE — ED Notes (Signed)
 Dr Lawrnce arranged for pt to transfer to [facility] ED under the care of Dr. Gaither due to increased restlessness, intermittent agitation and foley catheter irritation.  (Rule out infection).  Pts sister Landry Pelt contacted and notified.

## 2024-10-16 NOTE — ED Notes (Signed)
 Pt is up and awake on the unit.  He is fidgety, restless.  Appears anxious. He is irritable and preoccupied with discharge plans.  He thinks that is family is going to pick him up today.  He also believed that he will be going to the orthopedic surgeon to have the sutures removed from his left knee. (Plan for suture removal is unknown thus far) This clinical research associate educated pt that a nurse or one of the providers could remove his sutures and he said Oh No.  The Dr. Has to do that.  He is easily agitated, disorganized, hyper-verbal, tangential' however, he is easily redirected and responsive to decreased stimulation.

## 2024-10-16 NOTE — ED Triage Notes (Signed)
"  °  Per EMS:  138/90 74 98% RA CBG 130 "

## 2024-10-16 NOTE — ED Notes (Signed)
 Pt is loud and disruptive to unit. Hyper verbal and religious, attempting to leave the unit. PRN meds givien

## 2024-10-17 DIAGNOSIS — F250 Schizoaffective disorder, bipolar type: Secondary | ICD-10-CM | POA: Diagnosis not present

## 2024-10-17 MED ORDER — CEPHALEXIN 500 MG PO CAPS
500.0000 mg | ORAL_CAPSULE | Freq: Three times a day (TID) | ORAL | Status: DC
  Administered 2024-10-17: 500 mg via ORAL
  Filled 2024-10-17: qty 1

## 2024-10-17 NOTE — Discharge Instructions (Addendum)
 Discharge recommendations:   Medications: Patient is to take medications as prescribed. The patient or patient's guardian is to contact a medical professional and/or outpatient provider to address any new side effects that develop. The patient or the patient's guardian should update outpatient providers of any new medications and/or medication changes.    Outpatient Follow up: Please contact [facility] to schedule a follow up appointment for medication management and therapy. You may also walk-in M-F, 8am-3pm.   [facility]  [address] [place], [place] [address] [phone]   Therapy: We recommend that patient participate in individual therapy to address mental health concerns.  Atypical antipsychotics: If you are prescribed an atypical antipsychotic, it is recommended that your height, weight, BMI, blood pressure, fasting lipid panel, and fasting blood sugar be monitored by your outpatient providers.  Safety:   The following safety precautions should be taken:   No sharp objects. This includes scissors, razors, scrapers, and putty knives.   Chemicals should be removed and locked up.   Medications should be removed and locked up.   Weapons should be removed and locked up. This includes firearms, knives and instruments that can be used to cause injury.   The patient should abstain from use of illicit substances/drugs and abuse of any medications.  If symptoms worsen or do not continue to improve or if the patient becomes actively suicidal or homicidal then it is recommended that the patient return to the closest hospital emergency department, the [facility], or call 911 for further evaluation and treatment. National Suicide Prevention Lifeline 1-800-SUICIDE or 626-134-7298.  About 988 988 offers 24/7 access to trained crisis counselors who can help people experiencing mental health-related distress. People can call or text 988 or chat  988lifeline.org for themselves or if they are worried about a loved one who may need crisis support.

## 2024-10-17 NOTE — ED Provider Notes (Signed)
 Emergency Medicine Observation Re-evaluation Note  Robert Fowler is a 74 y.o. male, seen on rounds today.  Pt initially presented to the ED for complaints of No chief complaint on file. Currently, the patient is resting comfortably in the hallway.  Labs from yesterday showed the patient to have a UTI.  Will start patient on Keflex .  Physical Exam  BP (!) 130/90   Pulse 76   Temp 97.6 F (36.4 C)   Resp 18   SpO2 99%  Physical Exam   ED Course / MDM  EKG:   I have reviewed the labs performed to date as well as medications administered while in observation.  Recent changes in the last 24 hours include none.  Plan  Current plan is for placement.    Dasie Faden, MD 10/17/24 [phone]

## 2024-10-17 NOTE — Consult Note (Signed)
 [facility] Psychiatric Consult Initial  Patient Name: .Robert Fowler  MRN: [id number]  DOB: November 21, 1950  Consult Order details:  Orders (From admission, onward)     Start     Ordered   10/16/24 2058  CONSULT TO CALL ACT TEAM       Ordering Provider: Flint Sonny POUR, PA-C  Provider:  (Not yet assigned)  Question:  Reason for Consult?  Answer:  Pt here from [facility] to hold for geripsych placement   10/16/24 2058             Mode of Visit: Tele-visit Virtual Statement:TELE PSYCHIATRY ATTESTATION & CONSENT As the provider for this telehealth consult, I attest that I verified the patient's identity using two separate identifiers, introduced myself to the patient, provided my credentials, disclosed my location, and performed this encounter via a HIPAA-compliant, real-time, face-to-face, two-way, interactive audio and video platform and with the full consent and agreement of the patient (or guardian as applicable.) Patient physical location: [facility]. Telehealth provider physical location: home office in state of [place].   Psychiatry Consult Evaluation  Service Date: October 17, 2024 LOS:  LOS: 0 days  Chief Complaint I felt like I needed help. I was feeling anxious.   Primary Psychiatric Diagnoses  Schizoaffective disorder, bipolar type    Assessment  Robert Fowler is a 74 y.o. male admitted: Presented to the ED on 10/16/2024  4:18 PM from the [facility] due to concerns of infection around his Foley catheter. His UA showed positive for urinary tract infection and he is started on Keflex  500 mg po every 8 hours x 5 days.  He carries the psychiatric diagnoses of schizoaffective disorder, bipolar type, and alcohol use disorder, and has a past medical history of I&D of left knee abscess, HTN and BPH with urinary retention.   His current presentation of rapid pressured speech, and flight of ideas is most consistent with schizoaffective disorder, bipolar type.  He meets criteria for outpatient psychiatry based on no evidence of imminent risk to self, others or acute mania or psychosis that presents as a risk to self or others at this time. Current outpatient psychotropic medications includes olanzapine , Depakote , Cogentin , Vistaril  and Prozac  and historically he has had a positive response to these medications. He was non compliant with medications prior to admission as evidenced by reporting medication noncompliance.   On initial examination, patient is seated with his sister [name] present at the time of the evaluation.  Patient is alert and oriented x 4. His thought process is linear. Thought content is positive for flight of ideas and negative for SI/HI/AVH. Objectively, no signs of acute psychosis, including no paranoia or delusional ideations on exam. His mood is anxious and affect is congruent. His speech is rapid and pressured. He is cooperative and does not appear to be in acute distress on exam.  Please see plan below for detailed recommendations.   Diagnoses:  Active Hospital problems: Active Problems:   Schizoaffective disorder, bipolar type (HCC)    Plan   ## Psychiatric Medication Recommendations:  Continue olanzapine  10 mg nightly for schizoaffective disorder Continue olanzapine  5 mg daily in the morning for schizoaffective disorder Continue Depakote  ER 750 mg nightly for mood stabilization/schizoaffective disorder Continue Cogentin  0.5 mg nightly for EPS prophylaxis Continue hydroxyzine  25 mg 3 times daily as needed for anxiety Stop Prozac  due to mania   ## Medical Decision Making Capacity: Not specifically addressed in this encounter  ## Further Work-up:  While pt on Qtc prolonging medications, please monitor & replete K+ to 4 and Mg2+ to 2 -- most recent EKG on 10/13/24 had QtC of 426 -- Pertinent labwork reviewed earlier this admission includes: CMP, CBC, EKG, UDS, BAL, valproic acid  level is 49   ##  Disposition:--Patient is psychiatrically cleared. Patient does not appear to be at imminent risk of dangerousness to self or others at this time. While future psychiatric events cannot be accurately predicted, the patient does not necessitate nor desire further acute inpatient psychiatric care at this time. The patient does not meet [place]  involuntary commitment criteria at this time. Patient is recommended to follow-up with outpatient psychiatry and therapy to address ongoing mental health concerns. Patient is to take medications as prescribed and report any new side effects to outpatient provider. Patient is established with [facility] for outpatient psychiatry. Patient is advised to refrain from using illicit drugs and drinking alcohol due to physical and mental health effects with drug and alcohol use. Case and plan of care discussed with attending psychiatrist Dr. Lynnette. Dr. Dasie, EDP., notified of disposition and plan of care.  Discharge recommendations:   Medications: Patient is to take medications as prescribed. The patient or patient's guardian is to contact a medical professional and/or outpatient provider to address any new side effects that develop. The patient or the patient's guardian should update outpatient providers of any new medications and/or medication changes.   Outpatient Follow up: Please contact [facility] to schedule a follow up appointment for medication management and therapy. You may also walk-in M-F, 8am-3pm.   [facility]  [address] [place], [place] [address] [phone]  Therapy: We recommend that patient participate in individual therapy to address mental health concerns.  Atypical antipsychotics: If you are prescribed an atypical antipsychotic, it is recommended that your height, weight, BMI, blood pressure, fasting lipid panel, and fasting blood sugar be monitored by your outpatient providers.  Safety:   The following safety precautions should be  taken:   No sharp objects. This includes scissors, razors, scrapers, and putty knives.   Chemicals should be removed and locked up.   Medications should be removed and locked up.   Weapons should be removed and locked up. This includes firearms, knives and instruments that can be used to cause injury.   The patient should abstain from use of illicit substances/drugs and abuse of any medications.  If symptoms worsen or do not continue to improve or if the patient becomes actively suicidal or homicidal then it is recommended that the patient return to the closest hospital emergency department, the [facility], or call 911 for further evaluation and treatment. National Suicide Prevention Lifeline 1-800-SUICIDE or (463) 871-3140.  About 988 988 offers 24/7 access to trained crisis counselors who can help people experiencing mental health-related distress. People can call or text 988 or chat 988lifeline.org for themselves or if they are worried about a loved one who may need crisis support.   ## Behavioral / Environmental: - No specific recommendations at this time.     ## Safety and Observation Level:  - Based on my clinical evaluation, I estimate the patient to be at low risk of self harm in the current setting. - At this time, we recommend  routine. This decision is based on my review of the chart including patient's history and current presentation, interview of the patient, mental status examination, and consideration of suicide risk including evaluating suicidal ideation, plan, intent, suicidal or self-harm  behaviors, risk factors, and protective factors. This judgment is based on our ability to directly address suicide risk, implement suicide prevention strategies, and develop a safety plan while the patient is in the clinical setting. Please contact our team if there is a concern that risk level has changed.  CSSR Risk Category:C-SSRS RISK CATEGORY: No  Risk  Suicide Risk Assessment: Patient has following modifiable risk factors for suicide: medication noncompliance, which we are addressing by outpatient psychiatry. Patient has following non-modifiable or demographic risk factors for suicide: male gender, history of suicide attempt, and psychiatric hospitalization Patient has the following protective factors against suicide: Access to outpatient mental health care, Supportive family, and no history of NSSIB  Thank you for this consult request. Recommendations have been communicated to the primary team.  We will sign off at this time.   Blanca Thornton, Wyline CROME, NP       History of Present Illness  Relevant Aspects of Hospital ED Course:   Patient initially presented to the [facility] voluntarily accompanied by his sister with complaints of manic symptoms which included feeling hyperactive, not sleeping for the past 3 days, racing thoughts, paranoia and delusional ideations. He also reported medication noncompliance.  UDS was positive for marijuana. He was recommended for geriatric inpatient psychiatric treatment.  However, he was not accepted for psychiatric placement over the course of 5 days.  On 10/14/24, it was noted that the patient was having difficulty urinating, pain and pressure. Due to concerns with history of urinary retention he was sent to the [facility] emergency department and returned back to the [facility] with a Foley catheter in place with recommendations to follow-up with outpatient urology once discharged.  On 10/16/2024, he was sent back to the [facility] emergency department due to concerns for infection infection around his foley catheter. He was noted to have a UTI. He was unable to return back to the [facility]. As of today, psychiatry disposition social worker has not been able to obtain inpatient psychiatric placement.  Patient Report:  Patient states that he had presented to  the [facility] because he felt like he needed help and was feeling anxious about something at home that he can no longer remember. He states that he thought that maybe he was starting to get dementia because his mother had dementia. He has never been diagnosed with dementia. Furthermore, he states that he felt like he could read people's mind based off their character and that he would know of things happening before it happened. He states that he was not sleeping and that he was up all night watching TV and praying out loud. He reports sleeping less then 3 hours per night and reports feeling hyperactive. He states that he had a conversation with his pastor and felt like Jesus was coming soon and talked about the passing of his mother on June 11, four days after his birthday and how he was feeling stressed and upset about his mother's death.    On reevaluation today, he states that he is ready to go home. He no longer feels hyperactive and denies auditory or visual hallucinations, and paranoia ideations. He reports feeling anxious due to being stressed about having to keep answering the same questions over and over. He reports improvement in sleep and reports sleeping 4 to 6 hours per night. He reports a pretty good appetite.  He denies suicidal ideations and states that he has a  lot of life to live for. He denies homicidal ideations. He continues to endorse sadness as he continues to grieve the loss of his mother. He reports outpatient psychiatry and therapy with [facility] and states that he sees Dr. Prentice for medication management. He reports occasional alcohol use and states that he has not consumed wine or alcohol in a while. He reports smoking a little marijuana and states that he has cut back on using marijuana. I discussed with the patient at length the risks associated with alcohol and drug use. Patient advised to refrain from using illicit drugs and drinking alcohol. He has  been medication compliant without any reported medication side effects.  Patient lives alone. He identifies his 2 sisters and brother as supportive. He denies access to firearms. He lives independently and is able to complete ADLs.  Psych ROS:  Depression: Yes, grief and sadness Anxiety:  anxious and stress  Mania (lifetime and current): Yes, hyperactive, racing thoughts, pressure speech and flight of ideas Psychosis: (lifetime and current): Yes, paranoia   Collateral information:  I spoke to the patient's sister Nena Ronde who was present at the time of the assessment. She states that she has seen improvement in the patient's mood and behaviors since he initially presented to the hospital.  She denies safety concerns with the patient returning home and would like to ensure that the patient is able to follow-up with [facility] soon for ongoing treatment. I discussed having [place], [facility] contact [facility] to schedule patient an appointment for follow-up for medication management and therapy. Safety planning completed with the patient and the patient's sister Nena Ronde, if patient's symptoms worsen or is in a crisis patient may go to the [facility], contact or call 56 crisis hotline or 911 for a crisis evaluation or go to the closest emergency department for psychiatric evaluation.  Review of Systems  Respiratory: Negative.    Cardiovascular: Negative.      Psychiatric and Social History  Psychiatric History:  Information collected from the patient, EMR and the patient's sister Nena Ronde  Prev Dx/Sx: Schizoaffective bipolar type and alcohol use disorder Current Psych Provider: [facility] Meds (current): Olanzapine , Depakote , hydroxyzine , and Cogentin  Therapy: Yes, [facility]  Prior Psych Hospitalization: Yes Prior Self Harm: No Prior Violence: No  Family Hx suicide: No  Social History:  Developmental Hx: Normal Occupational Hx: Retired Armed Forces Operational Officer Hx:  No Living Situation: Alone Spiritual Hx: Religious Access to weapons/lethal means: No  Substance History Alcohol: Yes Type of alcohol: Wine History of alcohol withdrawal seizures No History of DT's No Illicit drugs: Marijuana  Prescription drug abuse: No   Exam Findings  Physical Exam:  Vital Signs:  Temp:  [97.5 F (36.4 C)-98.3 F (36.8 C)] 97.6 F (36.4 C) (01/22 0747) Pulse Rate:  [71-76] 76 (01/22 0747) Resp:  [16-18] 18 (01/22 0747) BP: (125-196)/(88-178) 130/90 (01/22 0942) SpO2:  [97 %-100 %] 99 % (01/22 0747) Blood pressure (!) 130/90, pulse 76, temperature 97.6 F (36.4 C), resp. rate 18, SpO2 99%. There is no height or weight on file to calculate BMI.  Physical Exam Cardiovascular:     Rate and Rhythm: Normal rate.     Pulses: Normal pulses.  Genitourinary:    Comments: Foley  Neurological:     Mental Status: He is oriented to person, place, and time. Mental status is at baseline.     Mental Status Exam: General Appearance: Casual  Orientation:  Full (Time, Place, and Person)  Memory:  Immediate;   Fair Recent;   Fair Remote;   Fair  Concentration:  Concentration: Fair  Recall:  Fair  Attention  Fair  Eye Contact:  Fair  Speech:  Pressured  Language:  Fair  Volume:  Increased  Mood: Anxious   Affect:  Congruent  Thought Process:  Coherent  Thought Content:  Logical  Suicidal Thoughts:  No  Homicidal Thoughts:  No  Judgement:  Fair  Insight:  Fair  Psychomotor Activity:  Normal  Akathisia:  No  Fund of Knowledge:  Fair      Assets:  Manufacturing Systems Engineer Desire for Improvement Financial Resources/Insurance Housing Social Support  Cognition:  WNL  ADL's:  Intact  AIMS (if indicated):        Other History   These have been pulled in through the EMR, reviewed, and updated if appropriate.  Family History:  The patient's family history includes Alcohol abuse in his father and maternal grandmother; COPD in his mother; Dementia in his  mother.  Medical History: Past Medical History:  Diagnosis Date   Arthritis    BENIGN PROSTATIC HYPERTROPHY 07/18/2007   Bipolar disorder (HCC)    Habitual alcohol use    Lesion of oral mucosa 03/21/2018   Biopsy 03/2018 [facility]) - INFLAMMATORY FIBROUS HYPERPLASIA CONSISTENT WITH DENTURE INJURY (EPULIS FISSURATUM).    OSTEOPOROSIS 09/04/2007   DEXA -2.8 lumbar (03/2015)   Pes planus    Prediabetes 2014   Schizoaffective disorder (HCC)    h/o paranoid schizophrenia   Wears dentures    full upper and lower    Surgical History: Past Surgical History:  Procedure Laterality Date   COLONOSCOPY  07/2023   several TAs including 1.2cm one, redundant colon, rpt 3 yrs ([facility])   INGUINAL HERNIA REPAIR Right 08/26/2014   Dr Nalani   IR ANGIOGRAM PELVIS SELECTIVE OR SUPRASELECTIVE  07/11/2024   IR ANGIOGRAM SELECTIVE EACH ADDITIONAL VESSEL  07/11/2024   IR ANGIOGRAM SELECTIVE EACH ADDITIONAL VESSEL  07/11/2024   IR EMBO ARTERIAL NOT HEMORR HEMANG INC GUIDE ROADMAPPING  07/11/2024   IR RADIOLOGIST EVAL & MGMT  06/03/2024   IR RADIOLOGIST EVAL & MGMT  07/26/2024   IR US  GUIDE VASC ACCESS LEFT  07/11/2024   IR US  GUIDE VASC ACCESS LEFT  07/11/2024   IR US  GUIDE VASC ACCESS RIGHT  07/11/2024   IRRIGATION AND DEBRIDEMENT KNEE Left 10/03/2024   Procedure: IRRIGATION AND DEBRIDEMENT KNEE;  Surgeon: Sherida Adine BROCKS, MD;  Location: [facility];  Service: Orthopedics;  Laterality: Left;   MASS EXCISION N/A 04/13/2018   Procedure: EXCISION LOWER LIP ORAL  MASS;  Surgeon: Herminio Miu, MD;  Location: [facility];  Service: ENT;  Laterality: N/A;     Medications:  Current Medications[1]  Allergies: Allergies[2]  Cordell Coke L, NP     [1]  Current Facility-Administered Medications:    amLODipine  (NORVASC ) tablet 2.5 mg, 2.5 mg, Oral, Daily, Sofia, Leslie K, PA-C, 2.5 mg at 10/17/24 9057   aspirin  EC tablet 81 mg, 81 mg, Oral, Daily, Sofia, Leslie K, PA-C, 81 mg at 10/17/24 0942    B-complex with vitamin C tablet 1 tablet, 1 tablet, Oral, QODAY, Sofia, Leslie K, PA-C, 1 tablet at 10/17/24 0945   benztropine  (COGENTIN ) tablet 0.5 mg, 0.5 mg, Oral, QHS, Sofia, Leslie K, PA-C, 0.5 mg at 10/16/24 2154   calcium  citrate (CALCITRATE - dosed in mg elemental calcium ) tablet 950 mg, 950 mg, Oral, Daily, Sofia, Leslie K, PA-C, 950 mg at 10/17/24 1054   cephALEXin  (  KEFLEX ) capsule 500 mg, 500 mg, Oral, Q8H, Dasie Faden, MD, 500 mg at 10/17/24 9057   divalproex  (DEPAKOTE  ER) 24 hr tablet 750 mg, 750 mg, Oral, QHS, Sofia, Leslie K, PA-C, 750 mg at 10/16/24 2154   hydrOXYzine  (ATARAX ) tablet 25 mg, 25 mg, Oral, Q6H PRN, Sofia, Leslie K, PA-C   OLANZapine  zydis (ZYPREXA ) disintegrating tablet 10 mg, 10 mg, Oral, QHS, Sofia, Leslie K, PA-C, 10 mg at 10/16/24 2154   OLANZapine  zydis (ZYPREXA ) disintegrating tablet 5 mg, 5 mg, Oral, Daily, Sofia, Leslie K, PA-C, 5 mg at 10/17/24 9055   tamsulosin  (FLOMAX ) capsule 0.4 mg, 0.4 mg, Oral, Daily, Sofia, Leslie K, PA-C, 0.4 mg at 10/17/24 9057  Current Outpatient Medications:    acetaminophen  (TYLENOL ) 325 MG tablet, Take 650 mg by mouth at bedtime., Disp: , Rfl:    amLODipine  (NORVASC ) 2.5 MG tablet, Take 1 tablet (2.5 mg total) by mouth daily., Disp: 90 tablet, Rfl: 3   amoxicillin -clavulanate (AUGMENTIN ) 875-125 MG tablet, Take 1 tablet by mouth 2 (two) times daily. (Patient taking differently: Take 1 tablet by mouth 2 (two) times daily. Take for 10 days starting on 10/03/24), Disp: 20 tablet, Rfl: 0   aspirin  EC 81 MG tablet, Take 81 mg by mouth daily. Swallow whole., Disp: , Rfl:    B Complex Vitamins (VITAMIN B COMPLEX  PO), Take 1 capsule by mouth every other day., Disp: , Rfl:    benztropine  (COGENTIN ) 0.5 MG tablet, Take 0.5 mg by mouth at bedtime., Disp: , Rfl:    CALCIUM  PO, Take 1 tablet by mouth daily., Disp: , Rfl:    Cholecalciferol  (VITAMIN D3 PO), Take 1 capsule by mouth daily., Disp: , Rfl:    clindamycin  (CLEOCIN ) 300 MG capsule,  Take 300 mg by mouth 3 (three) times daily., Disp: , Rfl:    divalproex  (DEPAKOTE  ER) 250 MG 24 hr tablet, Take 750 mg by mouth at bedtime., Disp: , Rfl:    FLUoxetine  (PROZAC ) 20 MG capsule, Take 20 mg by mouth in the morning., Disp: , Rfl:    hydrOXYzine  (ATARAX /VISTARIL ) 25 MG tablet, Take 25 mg by mouth 2 (two) times daily as needed for anxiety., Disp: , Rfl:    OLANZapine  zydis (ZYPREXA ) 10 MG disintegrating tablet, Take 10 mg by mouth at bedtime., Disp: , Rfl:    OLANZapine  zydis (ZYPREXA ) 5 MG disintegrating tablet, Take 5 mg by mouth daily., Disp: , Rfl:    tadalafil  (CIALIS ) 5 MG tablet, Take 1 tablet (5 mg total) by mouth daily. (Patient taking differently: Take 5 mg by mouth at bedtime.), Disp: 10 tablet, Rfl: 0   tamsulosin  (FLOMAX ) 0.4 MG CAPS capsule, Take 1 capsule (0.4 mg total) by mouth daily., Disp: 90 capsule, Rfl: 3   cephALEXin  (KEFLEX ) 500 MG capsule, Take 500 mg by mouth 4 (four) times daily. Take for 7 days starting on 09/27/24 (Patient not taking: Reported on 10/17/2024), Disp: , Rfl:    doxycycline  (ADOXA) 100 MG tablet, Take 100 mg by mouth 2 (two) times daily. (Patient not taking: Reported on 10/17/2024), Disp: , Rfl:    oxyCODONE  (OXY IR/ROXICODONE ) 5 MG immediate release tablet, Take 5 mg by mouth daily as needed for severe pain (pain score 7-10). (Patient not taking: Reported on 10/17/2024), Disp: , Rfl:  [2]  Allergies Allergen Reactions   Poison Oak Extract Rash and Other (See Comments)    Broke out severely

## 2024-10-19 ENCOUNTER — Emergency Department (HOSPITAL_BASED_OUTPATIENT_CLINIC_OR_DEPARTMENT_OTHER)
Admission: EM | Admit: 2024-10-19 | Discharge: 2024-10-19 | Disposition: A | Discharge status: Home / Self Care | Attending: Emergency Medicine | Admitting: Emergency Medicine

## 2024-10-19 ENCOUNTER — Encounter (HOSPITAL_BASED_OUTPATIENT_CLINIC_OR_DEPARTMENT_OTHER): Payer: Self-pay

## 2024-10-19 DIAGNOSIS — T83098A Other mechanical complication of other indwelling urethral catheter, initial encounter: Secondary | ICD-10-CM | POA: Insufficient documentation

## 2024-10-19 DIAGNOSIS — T839XXA Unspecified complication of genitourinary prosthetic device, implant and graft, initial encounter: Secondary | ICD-10-CM

## 2024-10-19 DIAGNOSIS — Z7982 Long term (current) use of aspirin: Secondary | ICD-10-CM | POA: Insufficient documentation

## 2024-10-19 DIAGNOSIS — Z79899 Other long term (current) drug therapy: Secondary | ICD-10-CM | POA: Diagnosis not present

## 2024-10-19 NOTE — ED Triage Notes (Signed)
 He tells me that his foley catheter bag is leaking. He denies any other somatic issues. He is ambulatory and in no distress.

## 2024-10-19 NOTE — ED Provider Notes (Signed)
 " [place] EMERGENCY DEPARTMENT AT [facility] Provider Note   CSN: [id number] Arrival date & time: 10/19/24  9197     Patient presents with: catheter bag leaking   Robert Fowler is a 74 y.o. male.   Patient here because his Foley bag is leaking.  Does not have any other complaints.  No fevers no chills.  Otherwise Foley catheter seems to be working well.  He is not sure how Shona happened.  The history is provided by the patient.       Prior to Admission medications  Medication Sig Start Date End Date Taking? Authorizing Provider  acetaminophen  (TYLENOL ) 325 MG tablet Take 650 mg by mouth at bedtime.    [provider]  amLODipine  (NORVASC ) 2.5 MG tablet Take 1 tablet (2.5 mg total) by mouth daily. 05/17/24   Rilla Baller, MD  amoxicillin -clavulanate (AUGMENTIN ) 875-125 MG tablet Take 1 tablet by mouth 2 (two) times daily. Patient taking differently: Take 1 tablet by mouth 2 (two) times daily. Take for 10 days starting on 10/03/24 10/03/24   Sherida Adine BROCKS, MD  aspirin  EC 81 MG tablet Take 81 mg by mouth daily. Swallow whole.    [provider]  B Complex Vitamins (VITAMIN B COMPLEX  PO) Take 1 capsule by mouth every other day.    [provider]  benztropine  (COGENTIN ) 0.5 MG tablet Take 0.5 mg by mouth at bedtime.    [provider]  CALCIUM  PO Take 1 tablet by mouth daily.    [provider]  cephALEXin  (KEFLEX ) 500 MG capsule Take 500 mg by mouth 4 (four) times daily. Take for 7 days starting on 09/27/24 Patient not taking: Reported on 10/17/2024    [provider]  Cholecalciferol  (VITAMIN D3 PO) Take 1 capsule by mouth daily.    [provider]  clindamycin  (CLEOCIN ) 300 MG capsule Take 300 mg by mouth 3 (three) times daily.    [provider]  divalproex  (DEPAKOTE  ER) 250 MG 24 hr tablet Take 750 mg by mouth at bedtime. 02/23/18   [provider]  doxycycline  (ADOXA) 100 MG tablet Take  100 mg by mouth 2 (two) times daily. Patient not taking: Reported on 10/17/2024    [provider]  FLUoxetine  (PROZAC ) 20 MG capsule Take 20 mg by mouth in the morning. 02/23/18   [provider]  hydrOXYzine  (ATARAX /VISTARIL ) 25 MG tablet Take 25 mg by mouth 2 (two) times daily as needed for anxiety. 05/07/21   Rilla Baller, MD  OLANZapine  zydis (ZYPREXA ) 10 MG disintegrating tablet Take 10 mg by mouth at bedtime.    [provider]  OLANZapine  zydis (ZYPREXA ) 5 MG disintegrating tablet Take 5 mg by mouth daily.    [provider]  oxyCODONE  (OXY IR/ROXICODONE ) 5 MG immediate release tablet Take 5 mg by mouth daily as needed for severe pain (pain score 7-10). Patient not taking: Reported on 10/17/2024    [provider]  tadalafil  (CIALIS ) 5 MG tablet Take 1 tablet (5 mg total) by mouth daily. Patient taking differently: Take 5 mg by mouth at bedtime. 04/22/24   Pokhrel, Laxman, MD  tamsulosin  (FLOMAX ) 0.4 MG CAPS capsule Take 1 capsule (0.4 mg total) by mouth daily. 05/17/24   Rilla Baller, MD    Allergies: Poison oak extract    Review of Systems  Updated Vital Signs BP (!) 140/94 (BP Location: Left Arm)   Pulse 71   Temp 98 F (36.7 C) (Oral)   Resp  18   SpO2 100%   Physical Exam Constitutional:      General: He is not in acute distress.    Appearance: He is not ill-appearing.  Pulmonary:     Effort: Pulmonary effort is normal.  Abdominal:     General: Abdomen is flat.  Neurological:     Mental Status: He is alert.     (all labs ordered are listed, but only abnormal results are displayed) Labs Reviewed - No data to display  EKG: None  Radiology: No results found.   Procedures   Medications Ordered in the ED - No data to display                                  Medical Decision Making  Robert Fowler is here because he has a hole in his Foley catheter bag.  Otherwise Foley catheter is working well.  Tubing  and bag were replaced.  Discharged in good condition.  Have no concern for any other acute problems.  Foley was draining well otherwise.  This chart was dictated using voice recognition software.  Despite best efforts to proofread,  errors can occur which can change the documentation meaning.      Final diagnoses:  Problem with Foley catheter, initial encounter    ED Discharge Orders     None          Ruthe Cornet, DO 10/19/24 9093  "

## 2024-10-19 NOTE — ED Notes (Signed)
New leg bag applied.

## 2024-10-21 ENCOUNTER — Ambulatory Visit: Payer: Self-pay

## 2024-10-21 NOTE — Telephone Encounter (Signed)
 Attempted to call pt x3. VM left for pt. Routing to clinic for follow up  Copied from CRM [id number]. Topic: Clinical - Medical Advice >> Oct 21, 2024  1:14 PM Donna BRAVO wrote: Reason for CRM: Patient has questions about his catheter and who should take it out and when can he take it out.

## 2024-10-21 NOTE — Telephone Encounter (Signed)
 Attempted to call pt x1. VM left for pt. Will attempt to contact pt at a later time.   Copied from CRM #[id number]. Topic: Clinical - Medical Advice >> Oct 21, 2024  1:14 PM Donna BRAVO wrote: Reason for CRM: Patient has questions about his catheter and who should take it out and when can he take it out.  Patient would like to speak with a nurse

## 2024-10-21 NOTE — Telephone Encounter (Signed)
 Attempted to call pt x2. VM left for pt. Will attempt to contact pt at a later time.   Copied from CRM #[id number]. Topic: Clinical - Medical Advice >> Oct 21, 2024  1:14 PM Donna BRAVO wrote: Reason for CRM: Patient has questions about his catheter and who should take it out and when can he take it out.   Patient would like to speak with a nurse

## 2024-10-22 NOTE — Telephone Encounter (Signed)
 Called and spoke with pt. Reports no issues with catheter at present moment. Pt has Ov with Dr. Rilla on 10/29/24 and FU with Dr. Devere at [facility] on 10/31/2024

## 2024-10-29 ENCOUNTER — Ambulatory Visit: Admitting: Family Medicine

## 2024-10-29 ENCOUNTER — Ambulatory Visit: Payer: Self-pay | Admitting: Family Medicine

## 2024-10-29 ENCOUNTER — Encounter: Payer: Self-pay | Admitting: Family Medicine

## 2024-10-29 VITALS — BP 138/90 | HR 68 | Temp 97.8°F | Ht 68.5 in | Wt 188.0 lb

## 2024-10-29 DIAGNOSIS — F250 Schizoaffective disorder, bipolar type: Secondary | ICD-10-CM

## 2024-10-29 DIAGNOSIS — N401 Enlarged prostate with lower urinary tract symptoms: Secondary | ICD-10-CM | POA: Diagnosis not present

## 2024-10-29 DIAGNOSIS — F1290 Cannabis use, unspecified, uncomplicated: Secondary | ICD-10-CM | POA: Diagnosis not present

## 2024-10-29 DIAGNOSIS — L02416 Cutaneous abscess of left lower limb: Secondary | ICD-10-CM

## 2024-10-29 DIAGNOSIS — R82998 Other abnormal findings in urine: Secondary | ICD-10-CM | POA: Diagnosis not present

## 2024-10-29 DIAGNOSIS — N390 Urinary tract infection, site not specified: Secondary | ICD-10-CM

## 2024-10-29 DIAGNOSIS — R338 Other retention of urine: Secondary | ICD-10-CM | POA: Diagnosis not present

## 2024-10-29 DIAGNOSIS — F319 Bipolar disorder, unspecified: Secondary | ICD-10-CM

## 2024-10-29 DIAGNOSIS — R399 Unspecified symptoms and signs involving the genitourinary system: Secondary | ICD-10-CM

## 2024-10-29 DIAGNOSIS — R3915 Urgency of urination: Secondary | ICD-10-CM

## 2024-10-29 LAB — POC URINALSYSI DIPSTICK (AUTOMATED)
Bilirubin, UA: NEGATIVE
Blood, UA: NEGATIVE
Glucose, UA: NEGATIVE
Ketones, UA: NEGATIVE
Nitrite, UA: POSITIVE
Protein, UA: NEGATIVE
Spec Grav, UA: 1.01
Urobilinogen, UA: 0.2 U/dL
pH, UA: 7.5

## 2024-10-29 MED ORDER — CEPHALEXIN 500 MG PO CAPS: 500.0000 mg | ORAL_CAPSULE | Freq: Two times a day (BID) | ORAL | 0 refills | Status: AC

## 2024-10-29 MED ORDER — CEPHALEXIN 500 MG PO CAPS: 500.0000 mg | ORAL_CAPSULE | Freq: Two times a day (BID) | ORAL | 0 refills | Status: DC

## 2024-10-29 NOTE — Patient Instructions (Addendum)
 Urinalysis today  - urine culture sent.  Start keflex  500mg  twice daily for 7 days. I have sent this to your pharmacy.  Keep urology and psychiatry appointments.  Good to see you today I'm glad knee wound is healing so well.  Continue flomax .  Continue other medicines as on your medicine list.

## 2024-10-30 DIAGNOSIS — R399 Unspecified symptoms and signs involving the genitourinary system: Secondary | ICD-10-CM | POA: Insufficient documentation

## 2024-10-30 DIAGNOSIS — F1290 Cannabis use, unspecified, uncomplicated: Secondary | ICD-10-CM | POA: Insufficient documentation

## 2024-10-30 LAB — URINE CULTURE
MICRO NUMBER:: 17543093
SPECIMEN QUALITY:: ADEQUATE

## 2024-10-30 NOTE — Assessment & Plan Note (Signed)
 Ongoing urinary frequency, urgency, overflow incontinence despite foley catheter in place.  Update UA - abnormal so started keflex  500mg  bid 7d course and UCx sent.  He has urology f/u planned later this week.

## 2024-10-30 NOTE — Assessment & Plan Note (Signed)
 This is largely resolved.

## 2024-10-30 NOTE — Assessment & Plan Note (Signed)
 BPH with urinary retention despite flomax  and recent prostatic artery embolization 06/2024.  Now with foley catheter placed during recent hospitalization.  Keep uro f/u later this week.  He may discuss further BPH treatment options with urology.

## 2024-10-30 NOTE — Assessment & Plan Note (Signed)
 Pending rpt UCx, start keflex .  Recent UCx: 08/2024 - >100k pseudomonas treated with augmentin  05/2024 - >100k Enterococcus faecalis treated with macrobid  03/2024 - 60k Enterococcus faecalis treated with amoxicillin 

## 2024-10-30 NOTE — Assessment & Plan Note (Addendum)
 S/p recent [facility] hospitalization.  Prozac  temporarily held during acute mania.  Has psychiatry f/u planned later this week.  He will take hospital records with him to that appt.

## 2024-10-30 NOTE — Assessment & Plan Note (Signed)
 UDS positive for MJ at hospital Encouraged full cessation, discussed negative effect on mood, especially given other psychotropics he's on.

## 2025-05-14 ENCOUNTER — Other Ambulatory Visit

## 2025-05-20 ENCOUNTER — Ambulatory Visit

## 2025-05-21 ENCOUNTER — Encounter: Admitting: Family Medicine
